# Patient Record
Sex: Male | Born: 1940 | Race: Black or African American | Hispanic: No | Smoking: Never smoker
Health system: Southern US, Community
[De-identification: ages and names within clinical notes are randomized; demographics above are authoritative.]

## PROBLEM LIST (undated history)

## (undated) DIAGNOSIS — C801 Malignant (primary) neoplasm, unspecified: Secondary | ICD-10-CM

## (undated) DIAGNOSIS — I480 Paroxysmal atrial fibrillation: Secondary | ICD-10-CM

## (undated) DIAGNOSIS — I509 Heart failure, unspecified: Secondary | ICD-10-CM

## (undated) DIAGNOSIS — M1990 Unspecified osteoarthritis, unspecified site: Secondary | ICD-10-CM

## (undated) DIAGNOSIS — I10 Essential (primary) hypertension: Secondary | ICD-10-CM

## (undated) DIAGNOSIS — N289 Disorder of kidney and ureter, unspecified: Secondary | ICD-10-CM

## (undated) DIAGNOSIS — E039 Hypothyroidism, unspecified: Secondary | ICD-10-CM

## (undated) DIAGNOSIS — I499 Cardiac arrhythmia, unspecified: Secondary | ICD-10-CM

## (undated) DIAGNOSIS — C61 Malignant neoplasm of prostate: Secondary | ICD-10-CM

## (undated) DIAGNOSIS — N183 Chronic kidney disease, stage 3 unspecified: Secondary | ICD-10-CM

## (undated) DIAGNOSIS — E119 Type 2 diabetes mellitus without complications: Secondary | ICD-10-CM

## (undated) DIAGNOSIS — I890 Lymphedema, not elsewhere classified: Secondary | ICD-10-CM

## (undated) HISTORY — DX: Malignant (primary) neoplasm, unspecified: C80.1

## (undated) HISTORY — DX: Heart failure, unspecified: I50.9

## (undated) HISTORY — DX: Chronic kidney disease, stage 3 unspecified: N18.30

## (undated) HISTORY — DX: Paroxysmal atrial fibrillation: I48.0

## (undated) HISTORY — PX: COLON SURGERY: SHX602

## (undated) HISTORY — DX: Malignant neoplasm of prostate: C61

## (undated) HISTORY — DX: Lymphedema, not elsewhere classified: I89.0

---

## 2006-05-01 ENCOUNTER — Emergency Department (HOSPITAL_COMMUNITY): Admission: EM | Admit: 2006-05-01 | Discharge: 2006-05-01 | Payer: Self-pay | Admitting: Emergency Medicine

## 2007-04-12 ENCOUNTER — Encounter: Admission: RE | Admit: 2007-04-12 | Discharge: 2007-04-12 | Payer: Self-pay | Admitting: Internal Medicine

## 2009-01-29 ENCOUNTER — Encounter: Admission: RE | Admit: 2009-01-29 | Discharge: 2009-01-29 | Payer: Self-pay | Admitting: Internal Medicine

## 2011-09-24 ENCOUNTER — Emergency Department (HOSPITAL_COMMUNITY)
Admission: EM | Admit: 2011-09-24 | Discharge: 2011-09-24 | Disposition: A | Discharge status: Home / Self Care | Payer: No Typology Code available for payment source | Attending: Emergency Medicine | Admitting: Emergency Medicine

## 2011-09-24 ENCOUNTER — Encounter: Payer: Self-pay | Admitting: *Deleted

## 2011-09-24 ENCOUNTER — Emergency Department (HOSPITAL_COMMUNITY): Payer: No Typology Code available for payment source

## 2011-09-24 DIAGNOSIS — T1490XA Injury, unspecified, initial encounter: Secondary | ICD-10-CM | POA: Insufficient documentation

## 2011-09-24 DIAGNOSIS — M545 Low back pain, unspecified: Secondary | ICD-10-CM

## 2011-09-24 DIAGNOSIS — W07XXXA Fall from chair, initial encounter: Secondary | ICD-10-CM | POA: Insufficient documentation

## 2011-09-24 DIAGNOSIS — S40019A Contusion of unspecified shoulder, initial encounter: Secondary | ICD-10-CM | POA: Insufficient documentation

## 2011-09-24 DIAGNOSIS — W19XXXA Unspecified fall, initial encounter: Secondary | ICD-10-CM

## 2011-09-24 DIAGNOSIS — S40011A Contusion of right shoulder, initial encounter: Secondary | ICD-10-CM

## 2011-09-24 HISTORY — DX: Essential (primary) hypertension: I10

## 2011-09-24 MED ORDER — ACETAMINOPHEN 500 MG PO TABS
1000.0000 mg | ORAL_TABLET | Freq: Once | ORAL | Status: AC
  Administered 2011-09-24: 1000 mg via ORAL
  Filled 2011-09-24: qty 2

## 2011-09-24 MED ORDER — TRAMADOL-ACETAMINOPHEN 37.5-325 MG PO TABS: ORAL_TABLET | ORAL | Status: DC

## 2011-09-24 MED ORDER — TRAMADOL HCL 50 MG PO TABS
100.0000 mg | ORAL_TABLET | Freq: Once | ORAL | Status: AC
  Administered 2011-09-24: 100 mg via ORAL
  Filled 2011-09-24: qty 2

## 2011-09-24 NOTE — ED Provider Notes (Cosign Needed)
History     CSN: [id number] Arrival date & time: 09/24/2011  1:50 PM   First MD Initiated Contact with Patient 09/24/11 1500      Chief Complaint  Patient presents with  . Fall    (Consider location/radiation/quality/duration/timing/severity/associated sxs/prior treatment) HPI  Patient relates last night about 8:30 PM he went to a meeting. He states normally there is carpeting on the floor but they were changing carpeting so last night which is the same and 4. He states he sat in a plastic outdoor chair and when he did the legs spread apart and the chair fell and split in 2 and the patient fell to the floor. He denies hitting his head or having loss of consciousness he states he thought he was fine that night however when he woke up this morning he has some discomfort in his right shoulder and has some pain in his lower back. He states it hurts when he moves certain ways. Nothing makes it feel better but he hasn't tried anything. He denies nausea vomiting visual changes.  Primary care physician Dr.Avbuere  Past Medical History  Diagnosis Date  . Hypertension     History reviewed. No pertinent past surgical history.  No family history on file.  History  Substance Use Topics  . Smoking status: Never Smoker   . Smokeless tobacco: Not on file  . Alcohol Use: Yes     rarely   Lives with wife Retired   Review of Systems  All other systems reviewed and are negative.    Allergies  Review of patient's allergies indicates no known allergies.  Home Medications   Current Outpatient Rx  Name Route Sig Dispense Refill  . AMLODIPINE BESYLATE 10 MG PO TABS Oral Take 10 mg by mouth daily.      . ASPIRIN EC 81 MG PO TBEC Oral Take 81 mg by mouth daily.      . OMEGA-3 FATTY ACIDS 1000 MG PO CAPS Oral Take 2 g by mouth daily.      . IBUPROFEN 200 MG PO TABS Oral Take 200 mg by mouth every 6 (six) hours as needed. FOR HEADACHES     . THERA M PLUS PO TABS Oral Take 1 tablet by mouth  daily.      . TELMISARTAN 40 MG PO TABS Oral Take 40 mg by mouth daily.        BP 165/75  Pulse 91  Temp(Src) 98 F (36.7 C) (Oral)  Resp 22  Wt 270 lb (122.471 kg)  SpO2 98%  Vital signs hypertension otherwise normal  Physical Exam  Vitals reviewed. Constitutional: He is oriented to person, place, and time. He appears well-developed and well-nourished.  Eyes: Conjunctivae are normal. Pupils are equal, round, and reactive to light.  Neck: Normal range of motion. Neck supple.  Musculoskeletal:       Patient has nontender right clavicle is nontender in the right subdeltoid bursa region. He has some minor discomfort when he abducts his right arm. He has pain mainly in the posterior shoulder on range of motion but range of motion is intact. Patient is nontender in the cervical or thoracic spine or in his upper lumbar spine. He indicates his pain is in the sacral region and extends out bilaterally over the SI joints. He has no patellar reflexes bilaterally even with distraction. No bruising was seen or abrasions.  Neurological: He is alert and oriented to person, place, and time.  Skin: Skin is warm and dry.  Psychiatric: He has a normal mood and affect. His behavior is normal. Thought content normal.    ED Course  Procedures (including critical care time)  Labs Reviewed - No data to display Dg Lumbar Spine Complete  09/24/2011  *RADIOLOGY REPORT*  Clinical Data: Low back and posterior pain post fall  LUMBAR SPINE - COMPLETE 4+ VIEW  Comparison: None  Findings: Five non-rib bearing lumbar vertebrae. Osseous demineralization. Minimal disc space narrowing L3-L4, L4-L5. Vertebral body heights maintained without fracture or subluxation. Facet degenerative changes lumbar spine. Minimal sclerosis at superior aspect of left SI joint. A number of calcifications are seen in the right mid abdomen of uncertain etiology. No definite spondylolysis. Bulky anterior spurs at L5-S1 as well as at multiple  thoracic vertebral endplates.  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 No acute bony abnormalities.  Original Report Authenticated By: Lollie Marrow, M.D.   Dg Shoulder Right  09/24/2011  *RADIOLOGY REPORT*  Clinical Data: Fall, shoulder pain  RIGHT SHOULDER - 2+ VIEW  Comparison: None.  Findings: Three views of the right shoulder submitted.  No acute fracture or subluxation.  Degenerative changes are noted glenohumeral joint.  Spurring of the right humeral head.  Mild inferior spurring of the acromion.  Mild degenerative changes acromioclavicular joint.  IMPRESSION: No acute fracture or subluxation.  Degenerative changes as described above.  Original Report Authenticated By: Natasha Mead, M.D.   Course patient given ultracet equivalent in the ED.   Diagnoses that have been ruled out:  Diagnoses that are still under consideration:  Final diagnoses:  Fall  Low back pain  Contusion of right shoulder    Medications  traMADol-acetaminophen (ULTRACET) 37.5-325 MG per tablet (not administered)  traMADol (ULTRAM) tablet 100 mg (100 mg Oral Given 09/24/11 1550)  acetaminophen (TYLENOL) tablet 1,000 mg (1000 mg Oral Given 09/24/11 1550)    Devoria Albe, MD, FACEP   MDM          Ward Givens, MD 09/25/11 [phone]

## 2011-09-24 NOTE — ED Notes (Signed)
Pt reports in meeting last night and put outdoor chairs on a cement floor. Chair cracked and patient fell.  Lower back/buttock pain and right shoulder pain.  Pt reports taking ibuprofen yesterday and helped some. Pt has had no medication today. Pain increases with ambulation.  No episodes of incontinence. Pain to lower back with movement.  Shoulder has full ROM in Shoulder. No bruising or deformity  noted to either area

## 2011-09-24 NOTE — ED Notes (Signed)
Pt states "fell last night about 2030, my lower spine and my right shoulder is hurting, the chair collapsed and I just rolled over on the right side"

## 2014-11-01 DIAGNOSIS — M170 Bilateral primary osteoarthritis of knee: Secondary | ICD-10-CM | POA: Insufficient documentation

## 2014-11-01 DIAGNOSIS — I10 Essential (primary) hypertension: Secondary | ICD-10-CM | POA: Insufficient documentation

## 2014-11-01 DIAGNOSIS — M109 Gout, unspecified: Secondary | ICD-10-CM | POA: Insufficient documentation

## 2015-05-26 DIAGNOSIS — N183 Chronic kidney disease, stage 3 unspecified: Secondary | ICD-10-CM | POA: Insufficient documentation

## 2015-06-19 DIAGNOSIS — F418 Other specified anxiety disorders: Secondary | ICD-10-CM | POA: Insufficient documentation

## 2016-06-21 DIAGNOSIS — L603 Nail dystrophy: Secondary | ICD-10-CM | POA: Insufficient documentation

## 2016-06-22 DIAGNOSIS — E538 Deficiency of other specified B group vitamins: Secondary | ICD-10-CM | POA: Insufficient documentation

## 2016-06-22 DIAGNOSIS — D500 Iron deficiency anemia secondary to blood loss (chronic): Secondary | ICD-10-CM | POA: Insufficient documentation

## 2017-10-01 DIAGNOSIS — N201 Calculus of ureter: Secondary | ICD-10-CM | POA: Insufficient documentation

## 2017-10-18 DIAGNOSIS — C184 Malignant neoplasm of transverse colon: Secondary | ICD-10-CM | POA: Insufficient documentation

## 2017-12-27 DIAGNOSIS — Z9181 History of falling: Secondary | ICD-10-CM | POA: Insufficient documentation

## 2019-01-01 DIAGNOSIS — I480 Paroxysmal atrial fibrillation: Secondary | ICD-10-CM | POA: Insufficient documentation

## 2019-01-04 DIAGNOSIS — R7303 Prediabetes: Secondary | ICD-10-CM | POA: Insufficient documentation

## 2020-06-25 ENCOUNTER — Encounter: Payer: Self-pay | Admitting: Nurse Practitioner

## 2020-06-25 ENCOUNTER — Ambulatory Visit (INDEPENDENT_AMBULATORY_CARE_PROVIDER_SITE_OTHER): Payer: Medicare HMO | Admitting: Nurse Practitioner

## 2020-06-25 ENCOUNTER — Other Ambulatory Visit: Payer: Self-pay

## 2020-06-25 VITALS — BP 138/76 | HR 70 | Temp 97.6°F | Resp 18 | Ht 67.0 in | Wt 302.0 lb

## 2020-06-25 DIAGNOSIS — I10 Essential (primary) hypertension: Secondary | ICD-10-CM | POA: Diagnosis not present

## 2020-06-25 DIAGNOSIS — I509 Heart failure, unspecified: Secondary | ICD-10-CM

## 2020-06-25 DIAGNOSIS — Z13220 Encounter for screening for lipoid disorders: Secondary | ICD-10-CM

## 2020-06-25 DIAGNOSIS — Z125 Encounter for screening for malignant neoplasm of prostate: Secondary | ICD-10-CM

## 2020-06-25 DIAGNOSIS — E039 Hypothyroidism, unspecified: Secondary | ICD-10-CM

## 2020-06-25 DIAGNOSIS — N429 Disorder of prostate, unspecified: Secondary | ICD-10-CM

## 2020-06-25 DIAGNOSIS — Z7689 Persons encountering health services in other specified circumstances: Secondary | ICD-10-CM

## 2020-06-25 NOTE — Patient Instructions (Signed)
Weigh at home when you first wake in morning prior to eating, drinking, dressing, report weight gain of 5lbs or more in 3 or less day to provider, seek medical attention. Also seek medical attention and report to provider for 3 days in a row of increased swelling, shortness of breath, cough fatigue.   Referral to cardiology  Follow 2 gram or less sodium per day diet  Take medication as prescribed  Elevated, compress lower extremity to reduce swelling  Follow up Monday  Labs today  Have your records transferred

## 2020-06-25 NOTE — Progress Notes (Signed)
New Patient Office Visit  Subjective:  Patient ID: Terry [name], male    DOB: [birth year]  Age: 79 y.o. MRN: [id number]  CC:  Chief Complaint  Patient presents with  . Establish Care    NP    HPI Terry [name] is a 79 year old male presenting to the clinic to establish care [place] the [place], He has not transferred his records yet but reports having h/o hypothyroid, HTN, CKD, CHF, colon CA with resection no chemo or radiation. He would like a referral to cardiology and nephrology specialist to establish care. He does not require refills at this time. He will have labs today as due. He does admit that he has not been compliant lately with lasix and having edema to ble. He was lacking in literacy of CHF knowledge management therefore time was spent educating the pt on home management, medication regimen therapeutic effects, red flags to report to provider, when to seek medical attention for CHF exacerbation. The pt was able to give verbal understanding feedback. The pt contracted to take his lasix as previous provider has prescribed, follow strict 2 gram or less sodium diet , elevate, compress ble, and if sxs not resolved or worsened will seek medical attention by Monday. No fever, chills, numbness, tingling, gu/gi sxs, pain, sob, cough, palpations, increased fatigue, muscle cramps, drainage, red streaking of ble, recent falls.   Labs to be completed today, pt to have records tx, will follow up with cpe and disease prevention screening when have records to compare and examine.   Past Medical History:  Diagnosis Date  . Hypertension     History reviewed. No pertinent surgical history.  History reviewed. No pertinent family history.  Social History   Socioeconomic History  . Marital status: Widowed    Spouse name: Not on file  . Number of children: Not on file  . Years of education: Not on file  . Highest education level: Not on file  Occupational History  . Not on file    Tobacco Use  . Smoking status: Never Smoker  . Smokeless tobacco: Never Used  Substance and Sexual Activity  . Alcohol use: Yes    Comment: rarely  . Drug use: Yes    Types: Marijuana  . Sexual activity: Yes  Other Topics Concern  . Not on file  Social History Narrative  . Not on file   Social Determinants of Health   Financial Resource Strain:   . Difficulty of Paying Living Expenses:   Food Insecurity:   . Worried About Charity fundraiser in the Last Year:   . Arboriculturist in the Last Year:   Transportation Needs:   . Film/video editor (Medical):   Marland Kitchen Lack of Transportation (Non-Medical):   Physical Activity:   . Days of Exercise per Week:   . Minutes of Exercise per Session:   Stress:   . Feeling of Stress :   Social Connections:   . Frequency of Communication with Friends and Family:   . Frequency of Social Gatherings with Friends and Family:   . Attends Religious Services:   . Active Member of Clubs or Organizations:   . Attends Archivist Meetings:   Marland Kitchen Marital Status:   Intimate Partner Violence:   . Fear of Current or Ex-Partner:   . Emotionally Abused:   Marland Kitchen Physically Abused:   . Sexually Abused:     ROS Review of Systems  All other systems reviewed  and are negative.   Objective:   Today's Vitals: BP 138/76 (BP Location: Left Arm, Patient Position: Sitting, Cuff Size: Large)   Pulse 70   Temp 97.6 F (36.4 C) (Temporal)   Resp 18   Ht 5\' 7"  (1.702 m)   Wt (!) 302 lb (137 kg)   SpO2 96%   BMI 47.30 kg/m   Physical Exam Vitals and nursing note reviewed.  Constitutional:      General: He is not in acute distress.    Appearance: Normal appearance. He is well-developed and well-groomed. He is not ill-appearing or toxic-appearing.  HENT:     Head: Normocephalic and atraumatic.     Right Ear: Hearing and external ear normal.     Left Ear: Hearing and external ear normal.     Nose: Nose normal.  Eyes:     General: Lids are  normal. Lids are everted, no foreign bodies appreciated.     Extraocular Movements: Extraocular movements intact.     Conjunctiva/sclera: Conjunctivae normal.     Pupils: Pupils are equal, round, and reactive to light.  Neck:     Thyroid: No thyromegaly or thyroid tenderness.     Vascular: No carotid bruit or JVD.  Cardiovascular:     Rate and Rhythm: Normal rate and regular rhythm.     Pulses: Normal pulses.     Heart sounds: Normal heart sounds, S1 normal and S2 normal.  Pulmonary:     Effort: Pulmonary effort is normal.     Breath sounds: Normal breath sounds.  Abdominal:     General: There is no distension or abdominal bruit.     Palpations: Abdomen is soft. There is no hepatomegaly or splenomegaly.     Tenderness: There is no guarding.  Musculoskeletal:        General: Normal range of motion.     Cervical back: Normal range of motion and neck supple.     Right lower leg: Edema present.     Left lower leg: Edema present.  Lymphadenopathy:     Cervical: No cervical adenopathy.  Skin:    General: Skin is warm and dry.     Capillary Refill: Capillary refill takes less than 2 seconds.     Coloration: Skin is not jaundiced or pale.  Neurological:     General: No focal deficit present.     Mental Status: He is alert and oriented to person, place, and time.  Psychiatric:        Attention and Perception: Attention normal.        Mood and Affect: Mood and affect normal.        Speech: Speech normal.        Behavior: Behavior normal. Behavior is cooperative.        Thought Content: Thought content normal.        Judgment: Judgment normal.     Assessment & Plan:   Problem List Items Addressed This Visit    None    Visit Diagnoses    Hypertension, unspecified type    -  Primary   Relevant Medications   carvedilol (COREG) 25 MG tablet   NIFEdipine (PROCARDIA XL/NIFEDICAL XL) 60 MG 24 hr tablet   apixaban (ELIQUIS) 5 MG TABS tablet   furosemide (LASIX) 40 MG tablet   Other  Relevant Orders   T4, free   Lipid panel   CBC with Differential/Platelet   COMPLETE METABOLIC PANEL WITH GFR   TSH   PSA   Congestive  heart failure, unspecified HF chronicity, unspecified heart failure type (HCC)       Relevant Medications   carvedilol (COREG) 25 MG tablet   NIFEdipine (PROCARDIA XL/NIFEDICAL XL) 60 MG 24 hr tablet   apixaban (ELIQUIS) 5 MG TABS tablet   furosemide (LASIX) 40 MG tablet   Other Relevant Orders   T4, free   Lipid panel   CBC with Differential/Platelet   COMPLETE METABOLIC PANEL WITH GFR   TSH   PSA   Hypothyroidism, unspecified type       Relevant Medications   carvedilol (COREG) 25 MG tablet   levothyroxine (SYNTHROID) 25 MCG tablet   Other Relevant Orders   T4, free   Lipid panel   CBC with Differential/Platelet   COMPLETE METABOLIC PANEL WITH GFR   TSH   PSA   Lipid screening       Relevant Orders   T4, free   Lipid panel   CBC with Differential/Platelet   COMPLETE METABOLIC PANEL WITH GFR   TSH   PSA   Encounter to establish care       Relevant Orders   T4, free   Lipid panel   CBC with Differential/Platelet   COMPLETE METABOLIC PANEL WITH GFR   TSH   PSA   Prostate cancer screening       Relevant Orders   T4, free   Lipid panel   CBC with Differential/Platelet   COMPLETE METABOLIC PANEL WITH GFR   TSH   PSA   Disorder of prostate, unspecified        Relevant Orders   PSA    Weigh at home when you first wake in morning prior to eating, drinking, dressing, report weight gain of 5lbs or more in 3 or less day to provider, seek medical attention. Also seek medical attention and report to provider for 3 days in a row of increased swelling, shortness of breath, cough fatigue.   Referral to cardiology  Follow 2 gram or less sodium per day diet  Take medication as prescribed  Elevated, compress lower extremity to reduce swelling  Follow up Monday  Labs today  Have your records transferred  Outpatient Encounter  Medications as of 06/25/2020  Medication Sig  . amLODipine (NORVASC) 10 MG tablet Take 10 mg by mouth daily.    Marland Kitchen apixaban (ELIQUIS) 5 MG TABS tablet Take 5 mg by mouth 2 (two) times daily.  Marland Kitchen aspirin EC 81 MG tablet Take 81 mg by mouth daily.    . carvedilol (COREG) 25 MG tablet Take 25 mg by mouth 2 (two) times daily with a meal.  . fish oil-omega-3 fatty acids 1000 MG capsule Take 2 g by mouth daily.    Marland Kitchen ibuprofen (ADVIL,MOTRIN) 200 MG tablet Take 200 mg by mouth every 6 (six) hours as needed. FOR HEADACHES   . levothyroxine (SYNTHROID) 25 MCG tablet Take 25 mcg by mouth daily before breakfast.  . Multiple Vitamins-Minerals (MULTIVITAMINS THER. W/MINERALS) TABS Take 1 tablet by mouth daily.    Marland Kitchen NIFEdipine (PROCARDIA XL/NIFEDICAL XL) 60 MG 24 hr tablet Take 60 mg by mouth daily.  Marland Kitchen telmisartan (MICARDIS) 40 MG tablet Take 40 mg by mouth daily.    . traMADol-acetaminophen (ULTRACET) 37.5-325 MG per tablet Use 1 or 2 po QID prn pain  . allopurinol (ZYLOPRIM) 300 MG tablet Take 300 mg by mouth daily.  . furosemide (LASIX) 40 MG tablet Take 20 mg by mouth daily.   No facility-administered encounter medications on file as  of 06/25/2020.    Follow-up: Return in about 1 week (around 07/02/2020).   Annie Main, FNP

## 2020-06-26 LAB — COMPLETE METABOLIC PANEL WITH GFR
AG Ratio: 1.1 (calc) (ref 1.0–2.5)
ALT: 9 U/L (ref 9–46)
AST: 16 U/L (ref 10–35)
Albumin: 4.2 g/dL (ref 3.6–5.1)
Alkaline phosphatase (APISO): 63 U/L (ref 35–144)
BUN/Creatinine Ratio: 12 (calc) (ref 6–22)
BUN: 15 mg/dL (ref 7–25)
CO2: 22 mmol/L (ref 20–32)
Calcium: 9.6 mg/dL (ref 8.6–10.3)
Chloride: 106 mmol/L (ref 98–110)
Creat: 1.22 mg/dL — ABNORMAL HIGH (ref 0.70–1.18)
GFR, Est African American: 65 mL/min/{1.73_m2} (ref >=60)
GFR, Est Non African American: 56 mL/min/{1.73_m2} — ABNORMAL LOW (ref >=60)
Globulin: 3.8 g/dL (calc) — ABNORMAL HIGH (ref 1.9–3.7)
Glucose, Bld: 88 mg/dL (ref 65–99)
Potassium: 3.8 mmol/L (ref 3.5–5.3)
Sodium: 138 mmol/L (ref 135–146)
Total Bilirubin: 0.4 mg/dL (ref 0.2–1.2)
Total Protein: 8 g/dL (ref 6.1–8.1)

## 2020-06-26 LAB — CBC WITH DIFFERENTIAL/PLATELET
Absolute Monocytes: 646 cells/uL (ref 200–950)
Basophils Absolute: 32 cells/uL (ref 0–200)
Basophils Relative: 0.5 %
Eosinophils Absolute: 122 cells/uL (ref 15–500)
Eosinophils Relative: 1.9 %
HCT: 42.4 % (ref 38.5–50.0)
Hemoglobin: 13 g/dL — ABNORMAL LOW (ref 13.2–17.1)
Lymphs Abs: 915 cells/uL (ref 850–3900)
MCH: 24.7 pg — ABNORMAL LOW (ref 27.0–33.0)
MCHC: 30.7 g/dL — ABNORMAL LOW (ref 32.0–36.0)
MCV: 80.5 fL (ref 80.0–100.0)
MPV: 10.6 fL (ref 7.5–12.5)
Monocytes Relative: 10.1 %
Neutro Abs: 4685 cells/uL (ref 1500–7800)
Neutrophils Relative %: 73.2 %
Platelets: 224 10*3/uL (ref 140–400)
RBC: 5.27 10*6/uL (ref 4.20–5.80)
RDW: 16.3 % — ABNORMAL HIGH (ref 11.0–15.0)
Total Lymphocyte: 14.3 %
WBC: 6.4 10*3/uL (ref 3.8–10.8)

## 2020-06-26 LAB — LIPID PANEL
Cholesterol: 112 mg/dL (ref <200)
HDL: 35 mg/dL — ABNORMAL LOW (ref >=40)
LDL Cholesterol (Calc): 63 mg/dL (calc)
Non-HDL Cholesterol (Calc): 77 mg/dL (calc) (ref <130)
Total CHOL/HDL Ratio: 3.2 (calc) (ref <5.0)
Triglycerides: 65 mg/dL (ref <150)

## 2020-06-26 LAB — PSA: PSA: 32.9 ng/mL — ABNORMAL HIGH (ref <=4.0)

## 2020-06-26 LAB — TSH: TSH: 3.45 mIU/L (ref 0.40–4.50)

## 2020-06-26 LAB — T4, FREE: Free T4: 1.2 ng/dL (ref 0.8–1.8)

## 2020-06-30 ENCOUNTER — Ambulatory Visit (INDEPENDENT_AMBULATORY_CARE_PROVIDER_SITE_OTHER): Payer: Medicare HMO | Admitting: Nurse Practitioner

## 2020-06-30 ENCOUNTER — Other Ambulatory Visit: Payer: Self-pay

## 2020-06-30 VITALS — BP 150/82 | HR 77 | Temp 97.8°F | Resp 18 | Wt 299.6 lb

## 2020-06-30 DIAGNOSIS — I509 Heart failure, unspecified: Secondary | ICD-10-CM

## 2020-06-30 DIAGNOSIS — L03119 Cellulitis of unspecified part of limb: Secondary | ICD-10-CM | POA: Diagnosis not present

## 2020-06-30 DIAGNOSIS — R972 Elevated prostate specific antigen [PSA]: Secondary | ICD-10-CM

## 2020-06-30 DIAGNOSIS — D539 Nutritional anemia, unspecified: Secondary | ICD-10-CM

## 2020-06-30 MED ORDER — CEFTRIAXONE SODIUM 1 G IJ SOLR
1.0000 g | Freq: Once | INTRAMUSCULAR | Status: AC
  Administered 2020-06-30: 1 g via INTRAMUSCULAR

## 2020-06-30 MED ORDER — FUROSEMIDE 40 MG PO TABS
ORAL_TABLET | ORAL | 0 refills | Status: DC
Start: 2020-06-30 — End: 2020-10-20

## 2020-06-30 MED ORDER — CEPHALEXIN 500 MG PO CAPS: 500.0000 mg | ORAL_CAPSULE | Freq: Four times a day (QID) | ORAL | 0 refills | Status: AC

## 2020-06-30 NOTE — Addendum Note (Signed)
Addended by: Ishmael Holter on: 06/30/2020 01:12 PM   Modules accepted: Level of Service

## 2020-06-30 NOTE — Progress Notes (Signed)
Established Patient Office Visit  Subjective:  Patient ID: Terry [name], male    DOB: Apr 07, 1941  Age: 79 y.o. MRN: [id number]  CC:  Chief Complaint  Patient presents with  . Follow-up    edema    HPI Terry [name] is a 79 year old male that established care last week following up today r/t he was not compliant with medications for CHF and having edema to BLE and directed as below.   " Congestive heart failure, unspecified HF chronicity, unspecified heart failure type (HCC)        Relevant Medications   carvedilol (COREG) 25 MG tablet   NIFEdipine (PROCARDIA XL/NIFEDICAL XL) 60 MG 24 hr tablet   apixaban (ELIQUIS) 5 MG TABS tablet   furosemide (LASIX) 40 MG tablet    Weigh at home when you first wake in morning prior to eating, drinking, dressing, report weight gain of 5lbs or more in 3 or less day to provider, seek medical attention. Also seek medical attention and report to provider for 3 days in a row of increased swelling, shortness of breath, cough fatigue.   Referral to cardiology  Follow 2 gram or less sodium per day diet  Take medication as prescribed  Elevated, compress lower extremity to reduce swelling  Follow up Monday. "  Past Medical History:  Diagnosis Date  . Hypertension     No past surgical history on file.  No family history on file.  Social History   Socioeconomic History  . Marital status: Widowed    Spouse name: Not on file  . Number of children: Not on file  . Years of education: Not on file  . Highest education level: Not on file  Occupational History  . Not on file  Tobacco Use  . Smoking status: Never Smoker  . Smokeless tobacco: Never Used  Substance and Sexual Activity  . Alcohol use: Yes    Comment: rarely  . Drug use: Yes    Types: Marijuana  . Sexual activity: Yes  Other Topics Concern  . Not on file  Social History Narrative  . Not on file   Social Determinants of Health   Financial Resource Strain:     . Difficulty of Paying Living Expenses:   Food Insecurity:   . Worried About Charity fundraiser in the Last Year:   . Arboriculturist in the Last Year:   Transportation Needs:   . Film/video editor (Medical):   Marland Kitchen Lack of Transportation (Non-Medical):   Physical Activity:   . Days of Exercise per Week:   . Minutes of Exercise per Session:   Stress:   . Feeling of Stress :   Social Connections:   . Frequency of Communication with Friends and Family:   . Frequency of Social Gatherings with Friends and Family:   . Attends Religious Services:   . Active Member of Clubs or Organizations:   . Attends Archivist Meetings:   Marland Kitchen Marital Status:   Intimate Partner Violence:   . Fear of Current or Ex-Partner:   . Emotionally Abused:   Marland Kitchen Physically Abused:   . Sexually Abused:     Outpatient Medications Prior to Visit  Medication Sig Dispense Refill  . allopurinol (ZYLOPRIM) 300 MG tablet Take 300 mg by mouth daily.    Marland Kitchen amLODipine (NORVASC) 10 MG tablet Take 10 mg by mouth daily.      Marland Kitchen apixaban (ELIQUIS) 5 MG TABS tablet Take 5 mg by  mouth 2 (two) times daily.    Marland Kitchen aspirin EC 81 MG tablet Take 81 mg by mouth daily.      . carvedilol (COREG) 25 MG tablet Take 25 mg by mouth 2 (two) times daily with a meal.    . fish oil-omega-3 fatty acids 1000 MG capsule Take 2 g by mouth daily.      . furosemide (LASIX) 40 MG tablet Take 20 mg by mouth daily.    Marland Kitchen ibuprofen (ADVIL,MOTRIN) 200 MG tablet Take 200 mg by mouth every 6 (six) hours as needed. FOR HEADACHES     . levothyroxine (SYNTHROID) 25 MCG tablet Take 25 mcg by mouth daily before breakfast.    . Multiple Vitamins-Minerals (MULTIVITAMINS THER. W/MINERALS) TABS Take 1 tablet by mouth daily.      Marland Kitchen NIFEdipine (PROCARDIA XL/NIFEDICAL XL) 60 MG 24 hr tablet Take 60 mg by mouth daily.    Marland Kitchen telmisartan (MICARDIS) 40 MG tablet Take 40 mg by mouth daily.      . traMADol-acetaminophen (ULTRACET) 37.5-325 MG per tablet Use 1 or 2  po QID prn pain 30 tablet 0   No facility-administered medications prior to visit.    No Known Allergies  ROS Review of Systems  All other systems reviewed and are negative.     Objective:    Physical Exam Vitals and nursing note reviewed.  Constitutional:      Appearance: Normal appearance.  HENT:     Head: Normocephalic.  Eyes:     Extraocular Movements: Extraocular movements intact.     Conjunctiva/sclera: Conjunctivae normal.     Pupils: Pupils are equal, round, and reactive to light.  Cardiovascular:     Rate and Rhythm: Normal rate and regular rhythm.  Pulmonary:     Effort: Pulmonary effort is normal.     Breath sounds: Normal breath sounds.  Musculoskeletal:     Right lower leg: Edema present.     Left lower leg: Edema present.  Skin:    Capillary Refill: Capillary refill takes less than 2 seconds.     Coloration: Skin is not jaundiced or pale.  Neurological:     General: No focal deficit present.     Mental Status: He is alert and oriented to person, place, and time.  Psychiatric:        Mood and Affect: Mood normal.        Thought Content: Thought content normal.        Judgment: Judgment normal.     BP (!) 150/82 (BP Location: Right Arm, Patient Position: Sitting, Cuff Size: Large)   Pulse 77   Temp 97.8 F (36.6 C) (Temporal)   Resp 18   Wt 299 lb 9.6 oz (135.9 kg)   SpO2 97%   BMI 46.92 kg/m  Wt Readings from Last 3 Encounters:  06/30/20 299 lb 9.6 oz (135.9 kg)  06/25/20 (!) 302 lb (137 kg)  09/24/11 270 lb (122.5 kg)     Health Maintenance Due  Topic Date Due  . Hepatitis C Screening  Never done  . INFLUENZA VACCINE  06/15/2020    There are no preventive care reminders to display for this patient.  Lab Results  Component Value Date   TSH 3.45 06/25/2020   Lab Results  Component Value Date   WBC 6.4 06/25/2020   HGB 13.0 (L) 06/25/2020   HCT 42.4 06/25/2020   MCV 80.5 06/25/2020   PLT 224 06/25/2020   Lab Results   Component Value Date  NA 138 06/25/2020   K 3.8 06/25/2020   CO2 22 06/25/2020   GLUCOSE 88 06/25/2020   BUN 15 06/25/2020   CREATININE 1.22 (H) 06/25/2020   BILITOT 0.4 06/25/2020   AST 16 06/25/2020   ALT 9 06/25/2020   PROT 8.0 06/25/2020   CALCIUM 9.6 06/25/2020   Lab Results  Component Value Date   CHOL 112 06/25/2020   Lab Results  Component Value Date   HDL 35 (L) 06/25/2020   Lab Results  Component Value Date   LDLCALC 63 06/25/2020   Lab Results  Component Value Date   TRIG 65 06/25/2020   Lab Results  Component Value Date   CHOLHDL 3.2 06/25/2020   No results found for: HGBA1C    Assessment & Plan:   Problem List Items Addressed This Visit      Other   Macrocytic anemia   Relevant Orders   B12 and Folate Panel   Elevated PSA   Relevant Orders   Ambulatory referral to Urology   Cellulitis of lower extremity - Primary   Relevant Medications   cephALEXin (KEFLEX) 500 MG capsule   furosemide (LASIX) 40 MG tablet    Other Visit Diagnoses    Congestive heart failure, unspecified HF chronicity, unspecified heart failure type (HCC)       Relevant Medications   furosemide (LASIX) 40 MG tablet    Time spent reviewing labs, BLE cellulitis/edema/CHF treatment plan, home management, diet with pt. Compression wrap ble applied with CMA assistance r/t weight of BLE and pt inability to assist with raising legs while rapping. IM 1 gram Rocephin administered pt waited 15 minutes no adverse reaction pt tolerated well, po RX antibiotic Keflex 500 mg take as prescribed, and Lasix 3 day extra 40 mg ose RX pt will take total 80 mg daily for 3 day, follow 1500 ml fluid restriction, 2 G sodium restriction returning in one week. Seeking medical attention for new, worsening, non resolving sxs.  Records needed! Please have transferred asap.   b12 and folate labs r/t macrocytic anemia  Referral to urology for elevated PSA  Referral again to Cardiology  Both stated  discussed in length with pt.   Time spent with pt 45 minutes.   Meds ordered this encounter  Medications  . cefTRIAXone (ROCEPHIN) injection 1 g  . cephALEXin (KEFLEX) 500 MG capsule    Sig: Take 1 capsule (500 mg total) by mouth 4 (four) times daily for 5 days.    Dispense:  20 capsule    Refill:  0  . furosemide (LASIX) 40 MG tablet    Sig: Take one tablet orally on Monday, Tuesday, and Wednesday with your regular daily dose to equal 80 mg daily for three day (06/30/2020-07/02/2020)    Dispense:  3 tablet    Refill:  0    Follow-up: Return in about 3 days (around 07/03/2020).    Annie Main, FNP

## 2020-07-02 DIAGNOSIS — I509 Heart failure, unspecified: Secondary | ICD-10-CM | POA: Insufficient documentation

## 2020-07-02 NOTE — Progress Notes (Signed)
Macrocytic anemia: B12 and Folate Panel (Order #[id number]) on 06/30/20 ORDERED Referred to nephrology  Referred to cardiology both on est care apt already with pts history  PSA is ELEVATED:  have referred to Urology let pt know.

## 2020-07-03 ENCOUNTER — Ambulatory Visit: Payer: Medicare Other | Admitting: Nurse Practitioner

## 2020-07-07 ENCOUNTER — Other Ambulatory Visit: Payer: Self-pay

## 2020-07-07 ENCOUNTER — Ambulatory Visit (INDEPENDENT_AMBULATORY_CARE_PROVIDER_SITE_OTHER): Payer: Medicare HMO | Admitting: Nurse Practitioner

## 2020-07-07 VITALS — BP 126/80 | HR 65 | Temp 97.6°F | Resp 18 | Wt 300.8 lb

## 2020-07-07 DIAGNOSIS — I10 Essential (primary) hypertension: Secondary | ICD-10-CM

## 2020-07-07 DIAGNOSIS — L03119 Cellulitis of unspecified part of limb: Secondary | ICD-10-CM

## 2020-07-07 DIAGNOSIS — I509 Heart failure, unspecified: Secondary | ICD-10-CM | POA: Diagnosis not present

## 2020-07-07 NOTE — Progress Notes (Signed)
Established Patient Office Visit  Subjective:  Patient ID: Terry [name], male    DOB: 26-May-1941  Age: 79 y.o. MRN: [id number]  CC:  Chief Complaint  Patient presents with  . Edema    follow up    HPI Terry [name] is a 79 year ld male with h/o CHF moving to the area recently presenting for a f/u after tx for ble cellulitis secondary CHF exacerbation.  Much improvement of ble cellulitis. Spoke with out of state prior cards direction to take qod lasix he has not been compliant r/t a family/friend embarrassed him mentioning incontinence which happens to him when he takes the lasix. He is compelled to be compliant with his CHF medication  Time spent educating, compelling, planning treatment plan discussing CHF. Referrals in place for Cards pt reports not receiving call with appt at this time he will check with office manager for contact information. Pt compelled to continue 2 G sodium restricted diet per day, 1510ml fluid restrictions per day, elevate ble often daily, compression hose often, compliance with medication, dry daily am weights upon waking prior to dressing eating or drinking but after voiding reporting 5lbs or more weight gain in 3 or less days and/or 3 days of increasing sxs of cough, sob, swelling in abdomen or feet, fatigue.  No cp, ct, gu/gi sxs, pain, sob, palpitations, decreased edema, no falls/injury since last visit.   Past Medical History:  Diagnosis Date  . Hypertension     No past surgical history on file.  No family history on file.  Social History   Socioeconomic History  . Marital status: Widowed    Spouse name: Not on file  . Number of children: Not on file  . Years of education: Not on file  . Highest education level: Not on file  Occupational History  . Not on file  Tobacco Use  . Smoking status: Never Smoker  . Smokeless tobacco: Never Used  Substance and Sexual Activity  . Alcohol use: Yes    Comment: rarely  . Drug use: Yes    Types:  Marijuana  . Sexual activity: Yes  Other Topics Concern  . Not on file  Social History Narrative  . Not on file   Social Determinants of Health   Financial Resource Strain:   . Difficulty of Paying Living Expenses: Not on file  Food Insecurity:   . Worried About Charity fundraiser in the Last Year: Not on file  . Ran Out of Food in the Last Year: Not on file  Transportation Needs:   . Lack of Transportation (Medical): Not on file  . Lack of Transportation (Non-Medical): Not on file  Physical Activity:   . Days of Exercise per Week: Not on file  . Minutes of Exercise per Session: Not on file  Stress:   . Feeling of Stress : Not on file  Social Connections:   . Frequency of Communication with Friends and Family: Not on file  . Frequency of Social Gatherings with Friends and Family: Not on file  . Attends Religious Services: Not on file  . Active Member of Clubs or Organizations: Not on file  . Attends Archivist Meetings: Not on file  . Marital Status: Not on file  Intimate Partner Violence:   . Fear of Current or Ex-Partner: Not on file  . Emotionally Abused: Not on file  . Physically Abused: Not on file  . Sexually Abused: Not on file    Outpatient Medications  Prior to Visit  Medication Sig Dispense Refill  . allopurinol (ZYLOPRIM) 300 MG tablet Take 300 mg by mouth daily.    Marland Kitchen amLODipine (NORVASC) 10 MG tablet Take 10 mg by mouth daily.      Marland Kitchen apixaban (ELIQUIS) 5 MG TABS tablet Take 5 mg by mouth 2 (two) times daily.    Marland Kitchen aspirin EC 81 MG tablet Take 81 mg by mouth daily.      . carvedilol (COREG) 25 MG tablet Take 25 mg by mouth 2 (two) times daily with a meal.    . fish oil-omega-3 fatty acids 1000 MG capsule Take 2 g by mouth daily.      . furosemide (LASIX) 40 MG tablet Take 20 mg by mouth daily.    . furosemide (LASIX) 40 MG tablet Take one tablet orally on Monday, Tuesday, and Wednesday with your regular daily dose to equal 80 mg daily for three day  (06/30/2020-07/02/2020) 3 tablet 0  . ibuprofen (ADVIL,MOTRIN) 200 MG tablet Take 200 mg by mouth every 6 (six) hours as needed. FOR HEADACHES     . levothyroxine (SYNTHROID) 25 MCG tablet Take 25 mcg by mouth daily before breakfast.    . Multiple Vitamins-Minerals (MULTIVITAMINS THER. W/MINERALS) TABS Take 1 tablet by mouth daily.      Marland Kitchen NIFEdipine (PROCARDIA XL/NIFEDICAL XL) 60 MG 24 hr tablet Take 60 mg by mouth daily.    Marland Kitchen telmisartan (MICARDIS) 40 MG tablet Take 40 mg by mouth daily.      . traMADol-acetaminophen (ULTRACET) 37.5-325 MG per tablet Use 1 or 2 po QID prn pain 30 tablet 0   No facility-administered medications prior to visit.    No Known Allergies  ROS Review of Systems  All other systems reviewed and are negative.     Objective:    Physical Exam Vitals and nursing note reviewed.  Constitutional:      General: He is not in acute distress.    Appearance: Normal appearance. He is well-developed and well-groomed. He is obese. He is not ill-appearing.  HENT:     Head: Normocephalic.     Right Ear: Hearing normal.     Left Ear: Hearing normal.     Mouth/Throat:     Lips: Pink.  Eyes:     Extraocular Movements: Extraocular movements intact.     Conjunctiva/sclera: Conjunctivae normal.     Pupils: Pupils are equal, round, and reactive to light.  Neck:     Vascular: No JVD.  Musculoskeletal:        General: Normal range of motion.     Right lower leg: Edema present.     Left lower leg: Edema present.  Skin:    Coloration: Skin is not jaundiced or pale.  Neurological:     General: No focal deficit present.     Mental Status: He is alert and oriented to person, place, and time.  Psychiatric:        Attention and Perception: Attention and perception normal.        Mood and Affect: Mood and affect normal.        Speech: Speech normal.        Behavior: Behavior normal. Behavior is cooperative.        Thought Content: Thought content normal.        Cognition  and Memory: Cognition normal.        Judgment: Judgment normal.     BP 126/80 (BP Location: Right Arm, Patient Position: Sitting, Cuff  Size: Large)   Pulse 65   Temp 97.6 F (36.4 C) (Temporal)   Resp 18   Wt (!) 300 lb 12.8 oz (136.4 kg)   SpO2 97%   BMI 47.11 kg/m  Wt Readings from Last 3 Encounters:  07/07/20 (!) 300 lb 12.8 oz (136.4 kg)  06/30/20 299 lb 9.6 oz (135.9 kg)  06/25/20 (!) 302 lb (137 kg)     Health Maintenance Due  Topic Date Due  . Hepatitis C Screening  Never done  . INFLUENZA VACCINE  06/15/2020    There are no preventive care reminders to display for this patient.  Lab Results  Component Value Date   TSH 3.45 06/25/2020   Lab Results  Component Value Date   WBC 6.4 06/25/2020   HGB 13.0 (L) 06/25/2020   HCT 42.4 06/25/2020   MCV 80.5 06/25/2020   PLT 224 06/25/2020   Lab Results  Component Value Date   NA 138 06/25/2020   K 3.8 06/25/2020   CO2 22 06/25/2020   GLUCOSE 88 06/25/2020   BUN 15 06/25/2020   CREATININE 1.22 (H) 06/25/2020   BILITOT 0.4 06/25/2020   AST 16 06/25/2020   ALT 9 06/25/2020   PROT 8.0 06/25/2020   CALCIUM 9.6 06/25/2020   Lab Results  Component Value Date   CHOL 112 06/25/2020   Lab Results  Component Value Date   HDL 35 (L) 06/25/2020   Lab Results  Component Value Date   LDLCALC 63 06/25/2020   Lab Results  Component Value Date   TRIG 65 06/25/2020   Lab Results  Component Value Date   CHOLHDL 3.2 06/25/2020   No results found for: HGBA1C    Assessment & Plan:   Problem List Items Addressed This Visit      Cardiovascular and Mediastinum   Congestive heart failure (HCC)     Other   Cellulitis of lower extremity - Primary    Other Visit Diagnoses    Hypertension, unspecified type        CHF, BLE cellulitis: Referrals in place for Cards pt reports not receiving call with appt at this time he will check with office manager for contact information. Pt compelled to continue 2 G sodium  restricted diet per day, 1553ml fluid restrictions per day, elevate ble often daily, compression hose often, compliance with medication, dry daily am weights upon waking prior to dressing eating or drinking but after voiding reporting 5lbs or more weight gain in 3 or less days and/or 3 days of increasing sxs of cough, sob, swelling in abdomen or feet, fatigue.  HTN: stable improved reading in clinic today, continue home bp monitoring 3 hours after taking blood pressure medications keeping log to bring to appt and seeking medical attn out of range >140/90  No orders of the defined types were placed in this encounter.   Follow-up: Return if symptoms worsen or fail to improve, for and chronic conditions every 6 months.    Annie Main, FNP

## 2020-07-07 NOTE — Patient Instructions (Signed)
CHF, BLE cellulitis: Referrals in place for Cards pt reports not receiving call with appt at this time he will check with office manager for contact information. Pt compelled to continue 2 G sodium restricted diet per day, 1568ml fluid restrictions per day, elevate ble often daily, compression hose often, compliance with medication, dry daily am weights upon waking prior to dressing eating or drinking but after voiding reporting 5lbs or more weight gain in 3 or less days and/or 3 days of increasing sxs of cough, sob, swelling in abdomen or feet, fatigue.  HTN: stable improved reading in clinic today, continue home bp monitoring 3 hours after taking blood pressure medications keeping log to bring to appt and seeking medical attn out of range >140/90

## 2020-07-08 ENCOUNTER — Encounter: Payer: Self-pay | Admitting: Urology

## 2020-07-09 ENCOUNTER — Ambulatory Visit: Payer: Self-pay | Admitting: Adult Health

## 2020-07-09 DIAGNOSIS — Z0289 Encounter for other administrative examinations: Secondary | ICD-10-CM

## 2020-07-15 ENCOUNTER — Telehealth: Payer: Medicare HMO | Admitting: Nurse Practitioner

## 2020-07-15 ENCOUNTER — Other Ambulatory Visit: Payer: Self-pay

## 2020-07-15 ENCOUNTER — Other Ambulatory Visit: Payer: Self-pay | Admitting: Nurse Practitioner

## 2020-07-15 DIAGNOSIS — I509 Heart failure, unspecified: Secondary | ICD-10-CM

## 2020-07-17 ENCOUNTER — Telehealth: Payer: Self-pay | Admitting: Nurse Practitioner

## 2020-07-17 ENCOUNTER — Other Ambulatory Visit: Payer: Self-pay | Admitting: Nurse Practitioner

## 2020-07-17 ENCOUNTER — Ambulatory Visit: Payer: Medicare HMO | Admitting: Nurse Practitioner

## 2020-07-17 DIAGNOSIS — N189 Chronic kidney disease, unspecified: Secondary | ICD-10-CM

## 2020-07-17 DIAGNOSIS — R600 Localized edema: Secondary | ICD-10-CM

## 2020-07-17 NOTE — Telephone Encounter (Signed)
Spoke to pt verified hippa, he asked for me to speak to his dgtr [name]. She expressed desire for palliative care at home for pt. Does not desire A.L

## 2020-07-17 NOTE — Telephone Encounter (Signed)
Spoke to [place] to notify her of referral given to [name] at [facility] for palliative CHF care today as she and pt requested. H/H for ble wound care compression wrap and medication management placed referral today as well. [facility] office manager aware to handle referral asap/stat.

## 2020-07-17 NOTE — Telephone Encounter (Signed)
spoke to [place] gave verbal refaeral for palliative CHF referral she was able to pull records from [facility] and has what she needs. REFerral acceptted    By Annie Main, FNP

## 2020-07-23 ENCOUNTER — Telehealth: Payer: Self-pay | Admitting: Nurse Practitioner

## 2020-07-23 NOTE — Telephone Encounter (Signed)
Telephone encounter [facility]

## 2020-07-25 ENCOUNTER — Telehealth: Payer: Self-pay | Admitting: *Deleted

## 2020-07-25 NOTE — Telephone Encounter (Signed)
Received call from [place], [facility] PT with [facility].   Requested orders ofr HH PT 1x weekly x1 week, 2x weekly x5 weeks, then 1x weekly x1 week for strengthening, gait, balance, home exercise program and home safety.   VO given.

## 2020-07-28 ENCOUNTER — Encounter: Payer: Self-pay | Admitting: Cardiovascular Disease

## 2020-07-28 ENCOUNTER — Ambulatory Visit (INDEPENDENT_AMBULATORY_CARE_PROVIDER_SITE_OTHER): Payer: Medicare HMO | Admitting: Cardiovascular Disease

## 2020-07-28 ENCOUNTER — Other Ambulatory Visit: Payer: Self-pay

## 2020-07-28 DIAGNOSIS — R600 Localized edema: Secondary | ICD-10-CM | POA: Diagnosis not present

## 2020-07-28 DIAGNOSIS — I10 Essential (primary) hypertension: Secondary | ICD-10-CM

## 2020-07-28 DIAGNOSIS — I5032 Chronic diastolic (congestive) heart failure: Secondary | ICD-10-CM

## 2020-07-28 NOTE — Progress Notes (Signed)
Cardiology Office Note  Date:  07/28/2020   ID:  Terry [name], DOB 1941-01-09, MRN [id number]  PCP:  Annie Main, FNP   Chief Complaint  Patient presents with  . New Patient (Initial Visit)    Referred by PCP for CHF. Patient c.o chest pain and swelling in ankles. Meds reviewed verbally with patient.     HPI:  [name] is a 79 year old gentleman with past medical history of Lower extremity edema Chronic kidney disease Morbid obesity Cellulitis Essential hypertension Incontinence on Lasix Referred by Terry [name] for consultation of his congestive heart failure  Long discussion with him concerning prior history Reports he has been seen by cardiologist in the past, lymphedema compression pumps has been prescribed Has been on Lasix daily but difficulty tolerating this, typically takes it every other day Has not been wearing his lymphedema compression pumps on a regular basis  He does not have accurate medication list with him Current medication list includes amlodipine, nifedipine Different doses of Lasix listed on his medication list  Lab work reviewed Creatinine 1.22 BUN 15 Hemoglobin 13 Total cholesterol 112 LDL 63  As to the timing of his leg swelling, onset unclear Unclear what his goal weight is, he does not know Relatively sedentary, presenting in a wheelchair  EKG personally reviewed by myself on todays visit Shows normal sinus rhythm rate 71 bpm no significant ST-T wave changes   PMH:   has a past medical history of Hypertension.  PSH:   History reviewed. No pertinent surgical history.  Current Outpatient Medications  Medication Sig Dispense Refill  . allopurinol (ZYLOPRIM) 300 MG tablet Take 300 mg by mouth daily.    Marland Kitchen amLODipine (NORVASC) 10 MG tablet Take 10 mg by mouth daily.      Marland Kitchen apixaban (ELIQUIS) 5 MG TABS tablet Take 5 mg by mouth 2 (two) times daily.    Marland Kitchen aspirin EC 81 MG tablet Take 81 mg by mouth daily.      . carvedilol (COREG)  25 MG tablet Take 25 mg by mouth 2 (two) times daily with a meal.    . fish oil-omega-3 fatty acids 1000 MG capsule Take 2 g by mouth daily.      . furosemide (LASIX) 40 MG tablet Take 20 mg by mouth daily.    . furosemide (LASIX) 40 MG tablet Take one tablet orally on Monday, Tuesday, and Wednesday with your regular daily dose to equal 80 mg daily for three day (06/30/2020-07/02/2020) 3 tablet 0  . ibuprofen (ADVIL,MOTRIN) 200 MG tablet Take 200 mg by mouth every 6 (six) hours as needed. FOR HEADACHES     . levothyroxine (SYNTHROID) 25 MCG tablet Take 25 mcg by mouth daily before breakfast.    . Multiple Vitamins-Minerals (MULTIVITAMINS THER. W/MINERALS) TABS Take 1 tablet by mouth daily.      Marland Kitchen NIFEdipine (PROCARDIA XL/NIFEDICAL XL) 60 MG 24 hr tablet Take 60 mg by mouth daily.    . tamsulosin (FLOMAX) 0.4 MG CAPS capsule Take 0.4 mg by mouth daily.    Marland Kitchen telmisartan (MICARDIS) 40 MG tablet Take 40 mg by mouth daily.      . traMADol-acetaminophen (ULTRACET) 37.5-325 MG per tablet Use 1 or 2 po QID prn pain 30 tablet 0   No current facility-administered medications for this visit.     Allergies:   Patient has no known allergies.   Social History:  The patient  reports that he has never smoked. He has never used smokeless tobacco. He  reports current alcohol use. He reports current drug use. Drug: Marijuana.   Family History:   family history is not on file.    Review of Systems: Review of Systems  Constitutional: Negative.   HENT: Negative.   Respiratory: Negative.   Cardiovascular: Positive for leg swelling.  Gastrointestinal: Negative.   Musculoskeletal: Negative.   Neurological: Negative.   Psychiatric/Behavioral: Negative.   All other systems reviewed and are negative.   PHYSICAL EXAM: VS:  BP 130/66 (BP Location: Right Arm, Patient Position: Sitting, Cuff Size: Normal)   Pulse 71   Ht 5\' 7"  (1.702 m)   Wt 299 lb (135.6 kg)   SpO2 96%   BMI 46.83 kg/m  , BMI Body mass  index is 46.83 kg/m. GEN: Well nourished, well developed, in no acute distress morbidly obese HEENT: normal Neck: no JVD, carotid bruits, or masses Cardiac: RRR; no murmurs, rubs, or gallops, 2+ pitting edema lower extremities Respiratory:  clear to auscultation bilaterally, normal work of breathing GI: soft, nontender, nondistended, + BS MS: no deformity or atrophy Skin: warm and dry, no rash Neuro:  Strength and sensation are intact Psych: euthymic mood, full affect   Recent Labs: 06/25/2020: ALT 9; BUN 15; Creat 1.22; Hemoglobin 13.0; Platelets 224; Potassium 3.8; Sodium 138; TSH 3.45    Lipid Panel Lab Results  Component Value Date   CHOL 112 06/25/2020   HDL 35 (L) 06/25/2020   LDLCALC 63 06/25/2020   TRIG 65 06/25/2020      Wt Readings from Last 3 Encounters:  07/28/20 299 lb (135.6 kg)  07/07/20 (!) 300 lb 12.8 oz (136.4 kg)  06/30/20 299 lb 9.6 oz (135.9 kg)       ASSESSMENT AND PLAN:  Problem List Items Addressed This Visit      Cardiology Problems   Congestive heart failure (HCC)   Relevant Orders   EKG 12-Lead    Other Visit Diagnoses    Morbid obesity ([place])    -  Primary   Lower extremity edema       Relevant Orders   ECHOCARDIOGRAM COMPLETE   Essential hypertension       Relevant Orders   EKG 12-Lead     Lymphedema Recommend he use lymphedema compression pumps 1 hour twice daily, leg elevation, Ace wraps as needed  Chronic diastolic CHF Likely exacerbated by morbid obesity We have ordered echocardiogram, he reports last was done 5 to 6 years ago Need to exclude pulmonary hypertension -For now continue Lasix every other day Creatinine acceptable  Morbid obesity We have encouraged careful diet management in an effort to lose weight. 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 [place], [place]

## 2020-07-28 NOTE — Patient Instructions (Addendum)
Pumps 1 hours twice a day Ace wraps, leg elevation  Medication Instructions:  No changes  Please call with medication list Look for amlodipine and nifedipine  If you need a refill on your cardiac medications before your next appointment, please call your pharmacy.    Lab work: No new labs needed   If you have labs (blood work) drawn today and your tests are completely normal, you will receive your results only by: Marland Kitchen MyChart Message (if you have MyChart) OR . A paper copy in the mail If you have any lab test that is abnormal or we need to change your treatment, we will call you to review the results.   Testing/Procedures:   Echocardiogram for leg swelling Your physician has requested that you have an echocardiogram. Echocardiography is a painless test that uses sound waves to create images of your heart. It provides your doctor with information about the size and shape of your heart and how well your heart's chambers and valves are working. This procedure takes approximately one hour. There are no restrictions for this procedure. You may get an IV, if needed, to receive an ultrasound enhancing agent through to better visualize your heart.    Follow-Up: At [facility], you and your health needs are our priority.  As part of our continuing mission to provide you with exceptional heart care, we have created designated Provider Care Teams.  These Care Teams include your primary Cardiologist (physician) and Advanced Practice Providers (APPs -  Physician Assistants and Nurse Practitioners) who all work together to provide you with the care you need, when you need it.  . You will need a follow up appointment in 3 months  . Providers on your designated Care Team:   . Murray Hodgkins, NP . Christell Faith, PA-C . Marrianne Mood, PA-C  Any Other Special Instructions Will Be Listed Below (If Applicable).  COVID-19 Vaccine Information can be found at:  [email or web address].co.uk For questions related to vaccine distribution or appointments, please email vaccine@[email or web address] or call [phone].

## 2020-07-28 NOTE — Telephone Encounter (Signed)
Yes VO approved

## 2020-07-29 ENCOUNTER — Ambulatory Visit (INDEPENDENT_AMBULATORY_CARE_PROVIDER_SITE_OTHER): Payer: Medicare HMO

## 2020-07-29 ENCOUNTER — Other Ambulatory Visit: Payer: Self-pay

## 2020-07-29 DIAGNOSIS — R600 Localized edema: Secondary | ICD-10-CM

## 2020-07-29 LAB — ECHOCARDIOGRAM COMPLETE
AR max vel: 1.99 cm2
AV Area VTI: 2.16 cm2
AV Area mean vel: 1.97 cm2
AV Mean grad: 10 mmHg
AV Peak grad: 19.5 mmHg
Ao pk vel: 2.21 m/s
Area-P 1/2: 2.69 cm2
S' Lateral: 3.2 cm

## 2020-07-29 MED ORDER — ALLOPURINOL 300 MG PO TABS: 300.0000 mg | ORAL_TABLET | Freq: Every day | ORAL | 0 refills | Status: DC

## 2020-07-29 MED ORDER — PERFLUTREN LIPID MICROSPHERE
1.0000 mL | INTRAVENOUS | Status: AC | PRN
  Administered 2020-07-29: 2 mL via INTRAVENOUS

## 2020-07-29 MED ORDER — TELMISARTAN 40 MG PO TABS: 40.0000 mg | ORAL_TABLET | Freq: Every day | ORAL | 0 refills | Status: DC

## 2020-07-30 ENCOUNTER — Ambulatory Visit
Admission: RE | Admit: 2020-07-30 | Discharge: 2020-07-30 | Disposition: A | Discharge status: Home / Self Care | Payer: Medicare HMO | Source: Ambulatory Visit | Attending: Urology | Admitting: Urology

## 2020-07-30 ENCOUNTER — Other Ambulatory Visit
Admission: RE | Admit: 2020-07-30 | Discharge: 2020-07-30 | Disposition: A | Discharge status: Home / Self Care | Payer: Medicare HMO | Source: Ambulatory Visit | Attending: Urology | Admitting: Urology

## 2020-07-30 ENCOUNTER — Ambulatory Visit
Admission: RE | Admit: 2020-07-30 | Discharge: 2020-07-30 | Disposition: A | Discharge status: Home / Self Care | Payer: Medicare HMO | Attending: Urology | Admitting: Urology

## 2020-07-30 ENCOUNTER — Ambulatory Visit (INDEPENDENT_AMBULATORY_CARE_PROVIDER_SITE_OTHER): Payer: Medicare HMO | Admitting: Urology

## 2020-07-30 ENCOUNTER — Encounter: Payer: Self-pay | Admitting: Family Medicine

## 2020-07-30 ENCOUNTER — Encounter: Payer: Self-pay | Admitting: Urology

## 2020-07-30 ENCOUNTER — Other Ambulatory Visit: Payer: Self-pay

## 2020-07-30 VITALS — BP 135/66 | HR 69 | Ht 67.0 in | Wt 300.0 lb

## 2020-07-30 DIAGNOSIS — Z466 Encounter for fitting and adjustment of urinary device: Secondary | ICD-10-CM

## 2020-07-30 DIAGNOSIS — C61 Malignant neoplasm of prostate: Secondary | ICD-10-CM | POA: Diagnosis not present

## 2020-07-30 DIAGNOSIS — Z8546 Personal history of malignant neoplasm of prostate: Secondary | ICD-10-CM | POA: Insufficient documentation

## 2020-07-30 LAB — PSA: Prostatic Specific Antigen: 41.11 ng/mL — ABNORMAL HIGH (ref 0.00–4.00)

## 2020-07-30 NOTE — Progress Notes (Signed)
   07/30/20 5:01 PM   Terry [name] [birth year] [id number]  CC: Elevated PSA  HPI: I saw Terry [name] in urology clinic today who was referred for an elevated PSA of 32.  He is a very comorbid 79 year old male who recently moved to the area from [place].  He is a very poor historian, but from what I can understand he was hospitalized for multiple months in [place] for " blood poisoning" and at some point had a colon cancer removed, as well as a stent put into his right kidney for unclear reasons.  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  He is unsure when he was diagnosed with prostate cancer, and those records are not currently available to me.  He has not told his daughter who he lives with that he has prostate cancer, as he does not want to worry her.  He has a number of cardiac comorbidities and is on Eliquis and diuretics.  He has urinary frequency and urgency, but denies any significant urinary symptoms.  His daughter joined him for the second half of the clinic visit, and was able to correlate some of the history, but the records are not currently available to me.   Physical Exam: BP 135/66 (BP Location: Left Arm, Patient Position: Sitting, Cuff Size: Large)   Pulse 69   Ht _0  (1.702 m)   Wt 300 lb (136.1 kg)   BMI 46.99 kg/m    Constitutional: Elderly, frail-appearing, in wheelchair Significant lower extremity edema  Laboratory Data: Reviewed in epic Creatinine 1.22, EGFR 65 PSA 32 WBC 6.4 Hematocrit 42 Platelets 224  Pertinent Imaging: None to review  Assessment & Plan:   In summary, he is a 79 year old male who recently moved to the area with a very complex history.  The history is obtained from the patient and his daughter today, and his outside records are not yet available to me.  We will work to obtain these.  Essentially it sounds like he had a ureteral stent  placed for renal failure and obstruction of some type, but it is unclear what the etiology was, secondly he was diagnosed with prostate cancer and sounds like he was on androgen deprivation therapy previously in [place] under the care of a urologist.  We discussed the need to obtain his full history regarding his prostate cancer diagnosis, as well as to understand the etiology of his current ureteral stent, and the possibility of removing this.  We also discussed the possibility of encrustation on the stent which would make removal much more challenging.   We will obtain outside records to get complete history in this very complex patient KUB today to evaluate for stent/encrustation on stent PSA today to evaluate PSA doubling time RTC 1 week to review above  I spent 65 total minutes on the day of the encounter including pre-visit review of the medical record, face-to-face time with the patient, and post visit ordering of labs/imaging/tests.  Nickolas Madrid, MD 07/30/2020  [facility] [address], [place], [place] [address] [phone]

## 2020-07-31 ENCOUNTER — Telehealth: Payer: Self-pay | Admitting: *Deleted

## 2020-07-31 ENCOUNTER — Encounter: Payer: Self-pay | Admitting: *Deleted

## 2020-07-31 ENCOUNTER — Telehealth: Payer: Self-pay

## 2020-07-31 MED ORDER — TELMISARTAN 40 MG PO TABS
40.0000 mg | ORAL_TABLET | Freq: Every day | ORAL | 0 refills | Status: DC
Start: 2020-07-31 — End: 2020-10-20

## 2020-07-31 NOTE — Telephone Encounter (Signed)
Called pt no answer. LM informing pt that he left cane here in our office and he could pick up at his convenience.

## 2020-07-31 NOTE — Telephone Encounter (Signed)
Received request from pharmacy for PA on [employer].   PA submitted.   Dx: I10- HTN  Received immediate determination.   Case ID [id number] approved 07/01/2020- 07/31/2021.

## 2020-08-01 ENCOUNTER — Encounter: Payer: Self-pay | Admitting: Family Medicine

## 2020-08-01 DIAGNOSIS — I509 Heart failure, unspecified: Secondary | ICD-10-CM | POA: Insufficient documentation

## 2020-08-01 DIAGNOSIS — C61 Malignant neoplasm of prostate: Secondary | ICD-10-CM | POA: Insufficient documentation

## 2020-08-01 NOTE — Telephone Encounter (Signed)
Nurse from [facility] calling to report a weight gain over night Pt c/o swelling: STAT is pt has developed SOB within 24 hours  1) How much weight have you gained and in what time span? 3 lbs overnight   2) If swelling, where is the swelling located? Normal edema, leg are wrapped with ace bandages  3) Are you currently taking a fluid pill? Lasix 40 mg 3x/days  4) Are you currently SOB? no  5) Do you have a log of your daily weights (if so, list)?  9/17 303 9/16 299  6) Have you gained 3 pounds in a day or 5 pounds in a week? yes  7) Have you traveled recently? No  Patient states "he ate good" yesterday and believes it may be from that

## 2020-08-04 ENCOUNTER — Other Ambulatory Visit: Payer: Self-pay

## 2020-08-04 ENCOUNTER — Telehealth (INDEPENDENT_AMBULATORY_CARE_PROVIDER_SITE_OTHER): Payer: Medicare HMO | Admitting: Family Medicine

## 2020-08-04 DIAGNOSIS — N189 Chronic kidney disease, unspecified: Secondary | ICD-10-CM | POA: Diagnosis not present

## 2020-08-04 DIAGNOSIS — I5032 Chronic diastolic (congestive) heart failure: Secondary | ICD-10-CM

## 2020-08-04 DIAGNOSIS — Z7689 Persons encountering health services in other specified circumstances: Secondary | ICD-10-CM | POA: Diagnosis not present

## 2020-08-04 DIAGNOSIS — R972 Elevated prostate specific antigen [PSA]: Secondary | ICD-10-CM

## 2020-08-04 DIAGNOSIS — I480 Paroxysmal atrial fibrillation: Secondary | ICD-10-CM

## 2020-08-04 DIAGNOSIS — I10 Essential (primary) hypertension: Secondary | ICD-10-CM | POA: Diagnosis not present

## 2020-08-04 NOTE — Progress Notes (Signed)
Subjective:    Patient ID: Terry [name], male    DOB: 1941-02-28, 79 y.o.   MRN: [id number]  HPI Patient is a 79 year old male being seen today as a telephone visit.  He is establishing care with me.  He previously has seen my partner who has left the area.  Both he and his daughter who provides the history consent to be seen via telephone.  Phone call began at 206.  Phone call concluded at 220.  Patient has a history of congestive heart failure.  Reviewing his chart he has seen Dr. Rockey Situ.  He has been diagnosed with diastolic congestive heart failure.  He also has a history of paroxysmal atrial fibrillation for which he is on chronic anticoagulation.  The patient has a history of stage III chronic kidney disease.  He also has a history of hypertension.  Recently his PSA was found to be greater than 30.  Reviewing his records from his urologist he has a history of a urethral stent that was placed for a "unknown reason".  The urologist was trying to get outside records.  They repeated a PSA and found it rising to 40.  This suggest underlying prostate cancer.  Based on my review of his chart it appears that treatment is yet to be determined.  Daughter has been out of work since Monday, September 13.  She is returning to work on Wednesday, September 29.  She has been out of work continuously helping to move her father from [place] to this area so that she can supervise his care more closely.  She is also been attending numerous doctors appointments including his cardiologist, his urologist.  This was all in an attempt to help establish care locally.  Therefore she needed continuous leave during that timeframe in order to arrange follow-up and establish with a specialist and also to move him into his home.  Moving forward she will need intermittent leave.  She anticipates that she will need up to 10 days a month depending upon the situation.  This would depend upon whether he is admitted to the hospital or  needs care at home.  She states that she has to help with his ADLs.  She helps to prepare all of his meals.  She has to place his compression hose on in the morning and remove them at night.  The patient is able to walk only using a walker or cane.  He is able to go to the bathroom however he is incontinent of urine.  She has to help clean him up from time to time and empty his bedside commode.  He is able to take a bath however he has to be helped onto a bath chair and supervise transitioning into the bathtub and out.  He is a very high fall risk and has fallen once this week already. Past Medical History:  Diagnosis Date  . Cancer ([place])    Phreesia 08/04/2020  . CHF, chronic (HCC)    diastolic  . CKD (chronic kidney disease) stage 3, GFR 30-59 ml/min   . Hypertension   . Paroxysmal atrial fibrillation (HCC)   . Prostate cancer [facility])    Past Surgical History:  Procedure Laterality Date  . COLON SURGERY N/A    Phreesia 08/04/2020   Current Outpatient Medications on File Prior to Visit  Medication Sig Dispense Refill  . allopurinol (ZYLOPRIM) 300 MG tablet Take 1 tablet (300 mg total) by mouth daily. 90 tablet 0  . apixaban (ELIQUIS)  5 MG TABS tablet Take 5 mg by mouth 2 (two) times daily.    Marland Kitchen aspirin EC 81 MG tablet Take 81 mg by mouth daily.      . carvedilol (COREG) 25 MG tablet Take 25 mg by mouth 2 (two) times daily with a meal.    . fish oil-omega-3 fatty acids 1000 MG capsule Take 2 g by mouth daily.      . furosemide (LASIX) 40 MG tablet Take 20 mg by mouth daily.    . furosemide (LASIX) 40 MG tablet Take one tablet orally on Monday, Tuesday, and Wednesday with your regular daily dose to equal 80 mg daily for three day (06/30/2020-07/02/2020) 3 tablet 0  . ibuprofen (ADVIL,MOTRIN) 200 MG tablet Take 200 mg by mouth every 6 (six) hours as needed. FOR HEADACHES     . levothyroxine (SYNTHROID) 25 MCG tablet Take 25 mcg by mouth daily before breakfast.    . Multiple Vitamins-Minerals  (MULTIVITAMINS THER. W/MINERALS) TABS Take 1 tablet by mouth daily.      Marland Kitchen NIFEdipine (ADALAT CC) 60 MG 24 hr tablet Take by mouth.    . tamsulosin (FLOMAX) 0.4 MG CAPS capsule Take 0.4 mg by mouth daily.    Marland Kitchen telmisartan (MICARDIS) 40 MG tablet Take 1 tablet (40 mg total) by mouth daily. 90 tablet 0  . traMADol-acetaminophen (ULTRACET) 37.5-325 MG per tablet Use 1 or 2 po QID prn pain 30 tablet 0   No current facility-administered medications on file prior to visit.   Allergies  Allergen Reactions  . Lisinopril     FACIAL SWELLING   Social History   Socioeconomic History  . Marital status: Widowed    Spouse name: Not on file  . Number of children: Not on file  . Years of education: Not on file  . Highest education level: Not on file  Occupational History  . Not on file  Tobacco Use  . Smoking status: Never Smoker  . Smokeless tobacco: Never Used  Substance and Sexual Activity  . Alcohol use: Yes    Comment: rarely  . Drug use: Yes    Types: Marijuana  . Sexual activity: Yes  Other Topics Concern  . Not on file  Social History Narrative  . Not on file   Social Determinants of Health   Financial Resource Strain:   . Difficulty of Paying Living Expenses: Not on file  Food Insecurity:   . Worried About Charity fundraiser in the Last Year: Not on file  . Ran Out of Food in the Last Year: Not on file  Transportation Needs:   . Lack of Transportation (Medical): Not on file  . Lack of Transportation (Non-Medical): Not on file  Physical Activity:   . Days of Exercise per Week: Not on file  . Minutes of Exercise per Session: Not on file  Stress:   . Feeling of Stress : Not on file  Social Connections:   . Frequency of Communication with Friends and Family: Not on file  . Frequency of Social Gatherings with Friends and Family: Not on file  . Attends Religious Services: Not on file  . Active Member of Clubs or Organizations: Not on file  . Attends Archivist  Meetings: Not on file  . Marital Status: Not on file  Intimate Partner Violence:   . Fear of Current or Ex-Partner: Not on file  . Emotionally Abused: Not on file  . Physically Abused: Not on file  . Sexually  Abused: Not on file    Past Medical History:  Diagnosis Date  . Cancer ([place])    Phreesia 08/04/2020  . CHF, chronic (HCC)    diastolic  . CKD (chronic kidney disease) stage 3, GFR 30-59 ml/min   . Hypertension   . Paroxysmal atrial fibrillation (HCC)   . Prostate cancer [facility])       Review of Systems  All other systems reviewed and are negative.      Objective:   Physical Exam        Assessment & Plan:  Chronic kidney disease, unspecified CKD stage  Elevated PSA  Hypertension, unspecified type  Encounter to establish care  Chronic diastolic congestive heart failure ([place])  Paroxysmal atrial fibrillation ([place])  Patient has a follow-up appointment with me scheduled in 2 months.  I will complete FMLA for his daughter.  She will need continuous leave from September 13 through September 29.  She will return to work on September 29.  This was done to help move the father into his new home.  He recently relocated from [place].  She also had to attend several appointments with his urologist and cardiologist in the last few weeks to establish care.  Moving forward, she will have to have intermittent leave.  Is difficult to predict the frequency however she may require up to 10 days a month most likely less.  This will be to take her father to doctor's appointments.  It would also be to intermittently provide care when he has wounds on his legs, or to assist with ADLs.  Moving forward they are going to try to arrange home health nursing to help provide supervision at home and manage his medication and assist with his ADLs so that she will be able to return to work however this is yet to be arranged.

## 2020-08-07 ENCOUNTER — Encounter: Payer: Self-pay | Admitting: Urology

## 2020-08-07 ENCOUNTER — Ambulatory Visit (INDEPENDENT_AMBULATORY_CARE_PROVIDER_SITE_OTHER): Payer: Medicare HMO | Admitting: Urology

## 2020-08-07 ENCOUNTER — Other Ambulatory Visit: Payer: Self-pay

## 2020-08-07 VITALS — BP 146/67 | HR 48 | Ht 67.0 in | Wt 300.0 lb

## 2020-08-07 DIAGNOSIS — N200 Calculus of kidney: Secondary | ICD-10-CM

## 2020-08-07 DIAGNOSIS — C61 Malignant neoplasm of prostate: Secondary | ICD-10-CM

## 2020-08-07 NOTE — Progress Notes (Signed)
08/07/2020 12:53 PM   Terry [name] [birth year] [id number]  Reason for visit: Right ureteral stent, prostate cancer  HPI: Briefly, he is a very comorbid 79 year old male who recently moved to the area from [place].  I originally met him on 07/30/2020.  He is a very poor historian, and the majority of the history has been obtained from the outside records that we were able to obtain from [facility].  He has an extensive cardiac history and CHF, and is currently on Eliquis.  He was hospitalized in November 2018 after being found down likely secondary to sepsis of urinary source with a 1.5 cm right mid ureteral stone, and underwent urgent ureteral stent placement with urology at that time.  He also underwent an exploratory laparotomy for a colon cancer at some point during that hospitalization as well.     He was also found to have an elevated PSA at some point prior to 2018 with a different urologist in [place] Dr. Vedia Coffer.  I was unable to get the records yet from his office, however I was able to get a hold of Dr. Burr Medico and have a conversation regarding Mr. Batta.  He was last seen in May 2018 and was on ADT for metastatic prostate cancer(last dose May 2018, PSA was 1.9).  His prostate biopsy showed Gleason score 5+4 = 9 prostate adenocarcinoma with a 155 g prostate, and metastatic disease with sclerotic lesions in the pubic rami.   PSA on 06/25/2020 was 32.9, and increased to 41.1 on 07/30/2020.  The patient continues to keep his metastatic prostate cancer diagnosis hidden from his family and daughter, and he refuses to include them in our conversation today.  He states he has been praying about his prostate cancer, which has been keeping him healthy.  I had another very frank and long conversation with the patient today about his numerous medical problems including metastatic prostate cancer, as well as a persistent mid ureteral stone with a right ureteral stent in place since 2018.  He  would be a very high risk operative candidate, even for ureteroscopy and laser lithotripsy with stent exchange, in addition he would be at very high risk for having a significantly encrusted stent that would make surgery prolonged and very challenging, finally with his Eliquis he would be high risk for bleeding complications with a complicated ureteroscopy, as well as with his massive 155 g plus prostate with prostate cancer.  I recommended starting with staging imaging with a CT abdomen pelvis with and without contrast, as well as a bone scan for better evaluation of his extent of metastatic prostate cancer and treatment options moving forward.  He would like to resume ADT which is likely reasonable pending imaging findings and goals of care.  As his renal function is okay with a creatinine of 1.22, EGFR of 65, it may be safer to observe him with his stent in place as he has tolerated this well and not had recurrent infections as far as we know, as opposed to considering a stent exchange or definitively managing his right ureteral stone.  I think he has a good understanding that he has a number of significant medical problems including his metastatic prostate cancer, and understands the importance of further imaging prior to determining a treatment strategy for his very complicated issues.  It is also very challenging to care for him when he excludes his family for many of the medical decision making.   -CT abdomen pelvis with and without  contrast and bone scan to evaluate extent of metastatic prostate cancer, as well as to evaluate stone burden -Consider oncology/hospice referral pending above results -RTC 2 weeks to review above  I spent 50 total minutes on the day of the encounter including pre-visit review of the medical record, face-to-face time with the patient, and post visit ordering of labs/imaging/tests.  Billey Co, [facility] [address], [place], [place] [address] [phone]

## 2020-08-08 NOTE — Telephone Encounter (Signed)
Attempted to call patient to discuss [place].  Mobile number said call cannot be completed as dialed. Please try again later.  Home number rang several times with no answer or voicemail.

## 2020-08-12 ENCOUNTER — Encounter: Payer: Self-pay | Admitting: Family Medicine

## 2020-08-14 ENCOUNTER — Telehealth: Payer: Self-pay

## 2020-08-14 NOTE — Telephone Encounter (Signed)
Called insurance for PA on Leon 240mg  [id number] cpt [place] with Sonia Baller [phone]) no PA needed for cpt code and is covered under plan ref [place] [phone] spoke with Amber and [phone] Auth# [id number] good from 08-14-20 through 02-11-2021

## 2020-08-21 ENCOUNTER — Encounter
Admission: RE | Admit: 2020-08-21 | Discharge: 2020-08-21 | Disposition: A | Discharge status: Home / Self Care | Payer: Medicare HMO | Source: Ambulatory Visit | Attending: Urology | Admitting: Urology

## 2020-08-21 ENCOUNTER — Other Ambulatory Visit: Payer: Self-pay

## 2020-08-21 ENCOUNTER — Encounter: Payer: Self-pay | Admitting: Urology

## 2020-08-21 ENCOUNTER — Ambulatory Visit
Admission: RE | Admit: 2020-08-21 | Discharge: 2020-08-21 | Disposition: A | Discharge status: Home / Self Care | Payer: Medicare HMO | Source: Ambulatory Visit | Attending: Urology | Admitting: Urology

## 2020-08-21 ENCOUNTER — Ambulatory Visit (INDEPENDENT_AMBULATORY_CARE_PROVIDER_SITE_OTHER): Payer: Medicare HMO | Admitting: Urology

## 2020-08-21 VITALS — BP 190/90 | HR 55 | Ht 67.0 in | Wt 300.0 lb

## 2020-08-21 DIAGNOSIS — C61 Malignant neoplasm of prostate: Secondary | ICD-10-CM | POA: Diagnosis not present

## 2020-08-21 DIAGNOSIS — N200 Calculus of kidney: Secondary | ICD-10-CM | POA: Diagnosis not present

## 2020-08-21 DIAGNOSIS — R300 Dysuria: Secondary | ICD-10-CM | POA: Diagnosis not present

## 2020-08-21 DIAGNOSIS — Z960 Presence of urogenital implants: Secondary | ICD-10-CM | POA: Diagnosis not present

## 2020-08-21 LAB — MICROSCOPIC EXAMINATION
RBC, Urine: >30 /hpf — AB (ref 0–2)
WBC, UA: >30 /hpf — AB (ref 0–5)

## 2020-08-21 LAB — URINALYSIS, COMPLETE
Bilirubin, UA: NEGATIVE
Glucose, UA: NEGATIVE
Ketones, UA: NEGATIVE
Nitrite, UA: NEGATIVE
Specific Gravity, UA: 1.02 (ref 1.005–1.030)
Urobilinogen, Ur: 0.2 mg/dL (ref 0.2–1.0)
pH, UA: 7.5 (ref 5.0–7.5)

## 2020-08-21 LAB — POCT I-STAT CREATININE: Creatinine, Ser: 0.9 mg/dL (ref 0.61–1.24)

## 2020-08-21 MED ORDER — TECHNETIUM TC 99M MEDRONATE IV KIT
20.0000 | PACK | Freq: Once | INTRAVENOUS | Status: AC | PRN
  Administered 2020-08-21: 20.839 via INTRAVENOUS

## 2020-08-21 MED ORDER — DEGARELIX ACETATE(240 MG DOSE) 120 MG/VIAL ~~LOC~~ SOLR: 240.0000 mg | Freq: Once | SUBCUTANEOUS | Status: DC

## 2020-08-21 MED ORDER — DEGARELIX ACETATE(240 MG DOSE) 120 MG/VIAL ~~LOC~~ SOLR
240.0000 mg | Freq: Once | SUBCUTANEOUS | Status: AC
  Administered 2020-08-21: 240 mg via SUBCUTANEOUS

## 2020-08-21 MED ORDER — IOHEXOL 350 MG/ML SOLN
100.0000 mL | Freq: Once | INTRAVENOUS | Status: AC | PRN
  Administered 2020-08-21: 100 mL via INTRAVENOUS

## 2020-08-21 NOTE — Progress Notes (Signed)
Firmagon Sub Q Injection  Due to Prostate Cancer patient is present today for a Firmagon Injection.   Medication: Mills Koller (Degarelix)  Dose: 240mg  Location: Bilateral upper abdomen Lot: [id number] Exp: 03/2022 Patient tolerated well, no complications were noted.  Performed by: Gordy Clement, [place], CMA   Follow up: 3 months

## 2020-08-21 NOTE — Progress Notes (Signed)
08/21/2020 5:58 PM   Terry [name] Dec 23, 1940 [id number]  Reason for visit: Follow up prostate cancer, right ureteral stone and stent  HPI: Briefly, he is a very comorbid 79 year old male who recently moved to the area from [place].  I originally met him on 07/30/2020.  He is a very poor historian, and the majority of the history has been obtained from the outside records that we were able to obtain from [facility].  He has an extensive cardiac history and CHF, and is currently on Eliquis.  He was hospitalized in November 2018 after being found down likely secondary to sepsis of urinary source with a 1 cm right mid ureteral stone, and underwent urgent ureteral stent placement with urology at that time.  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 He also underwent an exploratory laparotomy for a colon cancer at some point during that hospitalization as well.     He was also found to have an elevated PSA at some point prior to 2018 with a different urologist in [place] Dr. Vedia Coffer. He was last seen in May 2018 and was on ADT for metastatic prostate cancer(last dose May 2018, PSA was 1.9).  His prostate biopsy showed Gleason score 5+4 = 9 prostate adenocarcinoma with a 155 g prostate, and metastatic disease with sclerotic lesions in the pubic rami.   PSA on 06/25/2020 was 32.9, and increased to 41.1 on 07/30/2020, both off of ADT.  He continues to keep his prostate cancer diagnosis hidden from his family and daughter, and does not want them to be involved in any conversations regarding his prostate cancer.  He has mild to moderate urinary symptoms of urgency, frequency, and incontinence.  Some of this may be secondary to his high-dose diuretic, but may be secondary to his indwelling ureteral stent and possible UTI.  Urinalysis today is equivocal with greater than 30 WBCs, greater than 30 RBCs, few bacteria.  Will send for culture.  At our last visit, he  had agreed to repeat staging imaging with a bone scan and CT with contrast.  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  I think he has a good understanding that he has a number of significant medical problems including his metastatic prostate cancer.  I had another frank conversation with the patient today about his multiple major medical problems and poor health.  I am concerned that he would not survive a ureteroscopy, laser lithotripsy, and stent exchange, especially since that stent has been present for 3 years and is likely to be extremely encrusted.  Additionally he would be at high risk for bleeding with his Eliquis, and significant risk for perioperative complications with his poorly controlled CHF.  In terms of his prostate cancer, he would like to go back on ADT.  We discussed that this can slow down the spread of cancer and symptoms, and I also recommended considering an oncology consult for consideration of other options, however he would like to hold off at this time.  I do not think he would be a candidate for other treatments currently with his  numerous comorbidities.  After a long conversation today, he is in agreement to resume ADT for his metastatic prostate cancer, with close follow-up in 3 months to rediscuss consideration of ureteroscopy on the right side for his right ureteral stone with stent that has been in place for 3 years.  Unfortunately there is no easy way to manage his concurrent urologic and numerous medical problems without significant amount of operative risk.  We will call with urine culture  results today, start antibiotics for UTI if infected Follow-up bone scan results RTC 3 months to rediscuss  I spent 35 total minutes on the day of the encounter including pre-visit review of the medical record, face-to-face time with the patient, and post visit ordering of labs/imaging/tests.  Billey Co, [facility] [address] [place], [place] [address] [phone]

## 2020-08-22 NOTE — Telephone Encounter (Signed)
He has had metastatic prostate cancer since originally diagnosed in 2018 in [place].  He does not want to see oncology because he does not want his family to know about his diagnosis.  He has not mentioned pain to me before aside from his urinary symptoms from stent that has also been in since 2018.  Typically would start with tylenol for pain, as should avoid NSAIDs with history of CKD. If he needs chronic narcotics then he needs to get plugged in with a PCP here or pain management for long term opioids.   We will call with urine culture results if he needs antibiotics for UTI.   Thanks Nickolas Madrid, MD 08/22/2020

## 2020-08-24 LAB — CULTURE, URINE COMPREHENSIVE

## 2020-08-26 ENCOUNTER — Telehealth: Payer: Self-pay | Admitting: Cardiovascular Disease

## 2020-08-26 ENCOUNTER — Other Ambulatory Visit: Payer: Self-pay

## 2020-08-26 ENCOUNTER — Telehealth: Payer: Self-pay

## 2020-08-26 DIAGNOSIS — A491 Streptococcal infection, unspecified site: Secondary | ICD-10-CM

## 2020-08-26 MED ORDER — AMOXICILLIN 500 MG PO TABS: 500.0000 mg | ORAL_TABLET | Freq: Two times a day (BID) | ORAL | 0 refills | Status: AC

## 2020-08-26 NOTE — Telephone Encounter (Signed)
Pt c/o medication issue:  1. Name of Medication: Torsemide   2. How are you currently taking this medication (dosage and times per day)? Not taking in a week  Also not weighing himself  3. Are you having a reaction (difficulty breathing--STAT)? no  4. What is your medication issue? Per Home Health nurse patient with some wheezing LL Lobe BLE 3 + edema not wrapping legs or wearing Compression device patient c/o sob vss

## 2020-08-26 NOTE — Telephone Encounter (Signed)
Spoke with Joelene Millin nurse with home health that currently is seeing patient and she states patient is noncompliant. She reported that he has not been taking his furosemide due to excessive urination. She states that he does not wear his compression pumps as well. He is now wheezing, lower extremity swelling, and not checking his daily weights. She reports that he does have a scale but also not weighing daily. She states today's weight is 300.7 pounds. She stated that he reports elevating feet but he has not been doing that during her visits. She has alerted patients daughter who is also a Marine scientist and she is aware as well. Home health nurse states that she has filled his pill box but in the past he has been skipping around and taking pills out so she is not sure if he is taking them as filled. She did feel that he was somewhat depressed and she did call his primary care provider to see if they could prescribe something for that. She was appreciative for the call back and reinforced with patient reasons for medication, weights, compression, and leg elevation. Family is aware as well and she called to make [place] aware as well.

## 2020-08-26 NOTE — Telephone Encounter (Signed)
Kim from [facility] called and wanted to inform Dr Dennard Schaumann that Terry [name] is being none compliant with his medications he is not taking them. He has informed the nurse that he is having some depression. Nurse is wanting to know if something can be prescribed for depression.   Kim call back [phone]

## 2020-08-26 NOTE — Telephone Encounter (Signed)
Lets do amoxicillin 500mg  BID x 10 days, thanks   Nickolas Madrid, MD  08/26/2020   RX sent in. Pt informed.

## 2020-08-27 NOTE — Telephone Encounter (Signed)
Would push to moderate his fluid intake then if he is not taking medications on a regular basis Needs lymphedema compression pumps daily if not twice a day Would try to get diuretics and at least every other day if possible

## 2020-08-28 ENCOUNTER — Telehealth: Payer: Self-pay

## 2020-08-28 NOTE — Telephone Encounter (Signed)
Terry [name] from [facility] calling to follow up with Message on Tuesday and also his lungs are showing wheezing and wanting to know if something to be prescribed for his wheezing.   Terry [name] call back [phone]

## 2020-08-28 NOTE — Telephone Encounter (Signed)
Please Advise

## 2020-08-28 NOTE — Telephone Encounter (Signed)
Monique from [facility] called for verbal orders. 2 times a week for 3 weeks affect 09-01-2020 for Physical Therapy

## 2020-08-28 NOTE — Telephone Encounter (Signed)
Spoke with home health nurse and reviewed provider recommendations. She will try to continue working with him on these instructions with no further questions at this time.

## 2020-08-29 NOTE — Telephone Encounter (Signed)
I have never seen this patient before.  I really need to examine him before I prescribe any additional medication.

## 2020-08-29 NOTE — Telephone Encounter (Signed)
Again, I have never seen this patient before and I need to examine him before I will prescribe any additional medications.

## 2020-08-29 NOTE — Telephone Encounter (Signed)
Please call back and give verbal orders.

## 2020-09-01 NOTE — Telephone Encounter (Signed)
LVM for Terry [name] for [facility] and told her that Dr. Dennard Schaumann stated that he has never seen this pt and will need to make an appt to be examined.

## 2020-09-01 NOTE — Telephone Encounter (Signed)
LVM for Terry [name] at this # [phone] to for forth with the verbal orders for [facility].

## 2020-09-03 ENCOUNTER — Encounter: Payer: Self-pay | Admitting: Family Medicine

## 2020-09-03 NOTE — Telephone Encounter (Signed)
Error

## 2020-09-12 ENCOUNTER — Other Ambulatory Visit: Payer: Self-pay

## 2020-09-12 ENCOUNTER — Ambulatory Visit (INDEPENDENT_AMBULATORY_CARE_PROVIDER_SITE_OTHER): Payer: Medicare HMO | Admitting: Family Medicine

## 2020-09-12 ENCOUNTER — Other Ambulatory Visit: Payer: Self-pay | Admitting: Family Medicine

## 2020-09-12 ENCOUNTER — Encounter: Payer: Self-pay | Admitting: Family Medicine

## 2020-09-12 ENCOUNTER — Telehealth: Payer: Self-pay

## 2020-09-12 VITALS — BP 140/90 | HR 81 | Temp 98.6°F | Ht 67.0 in | Wt 303.0 lb

## 2020-09-12 DIAGNOSIS — I480 Paroxysmal atrial fibrillation: Secondary | ICD-10-CM

## 2020-09-12 DIAGNOSIS — C7951 Secondary malignant neoplasm of bone: Secondary | ICD-10-CM

## 2020-09-12 DIAGNOSIS — N189 Chronic kidney disease, unspecified: Secondary | ICD-10-CM

## 2020-09-12 DIAGNOSIS — R600 Localized edema: Secondary | ICD-10-CM | POA: Diagnosis not present

## 2020-09-12 DIAGNOSIS — M25562 Pain in left knee: Secondary | ICD-10-CM

## 2020-09-12 DIAGNOSIS — C61 Malignant neoplasm of prostate: Secondary | ICD-10-CM

## 2020-09-12 DIAGNOSIS — G8929 Other chronic pain: Secondary | ICD-10-CM

## 2020-09-12 DIAGNOSIS — I5032 Chronic diastolic (congestive) heart failure: Secondary | ICD-10-CM

## 2020-09-12 DIAGNOSIS — M25561 Pain in right knee: Secondary | ICD-10-CM

## 2020-09-12 MED ORDER — TRAMADOL HCL 50 MG PO TABS: 50.0000 mg | ORAL_TABLET | Freq: Two times a day (BID) | ORAL | 0 refills | Status: AC

## 2020-09-12 NOTE — Telephone Encounter (Signed)
Mr Micco daughter is on the phone and is anger about something, not sure !

## 2020-09-12 NOTE — Progress Notes (Signed)
Subjective:    Patient ID: Terry [name], male    DOB: 02-22-1941, 79 y.o.   MRN: [id number]  HPI Patient has a history of diastolic congestive heart failure.  Reviewing his chart he has seen Dr. Rockey Situ.  However phone records indicate that he is not being compliant with his fluid pills.  He also has a history of paroxysmal atrial fibrillation for which he is on chronic anticoagulation.  The patient has a history of stage III chronic kidney disease.  He also has a history of hypertension.  Reviewing his urology office visit from October 7, the patient has metastatic prostate cancer.  See bone scan results below: IMPRESSION: 1. Subtle focal uptake over the right inferior pubic ramus correlating with sclerotic lesion on CT and likely due to metastatic disease. Recent small sclerotic areas over the lumbar spine and sacrum on recent CT not well visualized on bone scan.  They have determined to start him on androgen deprivation therapy and reassess the patient in 3 months.  At the present time they are monitoring his right ureteral stent as I believe he is too high of an operative wrist to go in and try to remove it.  Patient is here today for follow-up with me.  Recently a home health nurse mentioned that they heard wheezing in his lungs.  He has no history of smoking however he states that he did smoke a fair amount of marijuana when he was younger.  However today on exam, the patient has no audible wheezing.  He does have bibasilar crackles on both sides.  He also has +1 pitting edema along with fibrotic changes in both legs consistent with chronic lymphedema.  He states that he has not been taking his fluid pill except maybe 2 days a week.  He is certainly not taking Lasix 20 mg a day.  Instead he takes 40 mg 1 or 2 days a week as he deems fit.  He does this only because it causes urinary frequency and he hates to have to go to the bathroom so much.  He denies any shortness of breath on exam today.   He denies any fever or chest pain or purulent sputum or productive cough or pleurisy.  Blood pressures acceptable.  He does complain of bilateral knee pain.  He denies any pain in his bones other than his knees.  He does not complain of any pain in his right hip or right gluteal area related to the prostate cancer.  His biggest pain is in his knees and he takes ibuprofen every day for the pain in his knees.  He has underlying chronic kidney disease and is also on anticoagulation for atrial fibrillation.  Therefore I was very clear that he cannot take any over-the-counter NSAID.  His daughter was requesting palliative care to help manage pain however at this point I think the patient would simply benefit from a low-dose tramadol 1 or 2 times a day for the pain in his knees.  I do not feel that he requires hospice at this time.  I discussed this with the patient and he agrees.  He is here today walking under his own power using a cane.  He certainly does not appear bedridden or in a terminal phase of his cancer.   Past Medical History:  Diagnosis Date  . Cancer ([place])    Phreesia 08/04/2020  . CHF, chronic (HCC)    diastolic  . CKD (chronic kidney disease) stage 3, GFR 30-59  ml/min ([place])   . Hypertension   . Paroxysmal atrial fibrillation (HCC)   . Prostate cancer [facility])    Past Surgical History:  Procedure Laterality Date  . COLON SURGERY N/A    Phreesia 08/04/2020   Current Outpatient Medications on File Prior to Visit  Medication Sig Dispense Refill  . allopurinol (ZYLOPRIM) 300 MG tablet Take 1 tablet (300 mg total) by mouth daily. 90 tablet 0  . apixaban (ELIQUIS) 5 MG TABS tablet Take 5 mg by mouth 2 (two) times daily.    Marland Kitchen aspirin EC 81 MG tablet Take 81 mg by mouth daily.      . carvedilol (COREG) 25 MG tablet Take 25 mg by mouth 2 (two) times daily with a meal.    . fish oil-omega-3 fatty acids 1000 MG capsule Take 2 g by mouth daily.      . furosemide (LASIX) 40 MG tablet Take 20 mg  by mouth daily.    . furosemide (LASIX) 40 MG tablet Take one tablet orally on Monday, Tuesday, and Wednesday with your regular daily dose to equal 80 mg daily for three day (06/30/2020-07/02/2020) 3 tablet 0  . ibuprofen (ADVIL,MOTRIN) 200 MG tablet Take 200 mg by mouth every 6 (six) hours as needed. FOR HEADACHES     . levothyroxine (SYNTHROID) 25 MCG tablet Take 25 mcg by mouth daily before breakfast.    . Multiple Vitamins-Minerals (MULTIVITAMINS THER. W/MINERALS) TABS Take 1 tablet by mouth daily.      . tamsulosin (FLOMAX) 0.4 MG CAPS capsule Take 0.4 mg by mouth daily.    Marland Kitchen telmisartan (MICARDIS) 40 MG tablet Take 1 tablet (40 mg total) by mouth daily. 90 tablet 0  . traMADol-acetaminophen (ULTRACET) 37.5-325 MG per tablet Use 1 or 2 po QID prn pain 30 tablet 0   No current facility-administered medications on file prior to visit.   Allergies  Allergen Reactions  . Lisinopril     FACIAL SWELLING   Social History   Socioeconomic History  . Marital status: Widowed    Spouse name: Not on file  . Number of children: Not on file  . Years of education: Not on file  . Highest education level: Not on file  Occupational History  . Not on file  Tobacco Use  . Smoking status: Never Smoker  . Smokeless tobacco: Never Used  Substance and Sexual Activity  . Alcohol use: Yes    Comment: rarely  . Drug use: Yes    Types: Marijuana  . Sexual activity: Yes  Other Topics Concern  . Not on file  Social History Narrative  . Not on file   Social Determinants of Health   Financial Resource Strain:   . Difficulty of Paying Living Expenses: Not on file  Food Insecurity:   . Worried About Charity fundraiser in the Last Year: Not on file  . Ran Out of Food in the Last Year: Not on file  Transportation Needs:   . Lack of Transportation (Medical): Not on file  . Lack of Transportation (Non-Medical): Not on file  Physical Activity:   . Days of Exercise per Week: Not on file  . Minutes  of Exercise per Session: Not on file  Stress:   . Feeling of Stress : Not on file  Social Connections:   . Frequency of Communication with Friends and Family: Not on file  . Frequency of Social Gatherings with Friends and Family: Not on file  . Attends Religious Services: Not  on file  . Active Member of Clubs or Organizations: Not on file  . Attends Archivist Meetings: Not on file  . Marital Status: Not on file  Intimate Partner Violence:   . Fear of Current or Ex-Partner: Not on file  . Emotionally Abused: Not on file  . Physically Abused: Not on file  . Sexually Abused: Not on file    Past Medical History:  Diagnosis Date  . Cancer ([place])    Phreesia 08/04/2020  . CHF, chronic (HCC)    diastolic  . CKD (chronic kidney disease) stage 3, GFR 30-59 ml/min (HCC)   . Hypertension   . Paroxysmal atrial fibrillation (HCC)   . Prostate cancer [facility])       Review of Systems  All other systems reviewed and are negative.      Objective:   Physical Exam Vitals reviewed.  Constitutional:      General: He is not in acute distress.    Appearance: He is obese. He is not ill-appearing, toxic-appearing or diaphoretic.  Cardiovascular:     Rate and Rhythm: Normal rate and regular rhythm.  Pulmonary:     Effort: Pulmonary effort is normal. No respiratory distress.     Breath sounds: Rales present. No wheezing.  Abdominal:     General: Abdomen is flat. Bowel sounds are normal.     Palpations: Abdomen is soft.  Musculoskeletal:     Right knee: Decreased range of motion. Tenderness present.     Left knee: Decreased range of motion. Tenderness present.     Right lower leg: Edema present.     Left lower leg: Edema present.  Neurological:     Mental Status: He is alert.           Assessment & Plan:  Chronic kidney disease, unspecified CKD stage - Plan: CBC with Differential/Platelet, COMPLETE METABOLIC PANEL WITH GFR  Paroxysmal atrial fibrillation (HCC)  Chronic  diastolic congestive heart failure (HCC)  Bilateral lower extremity edema  Chronic pain of both knees   Patient denies palliative care consult.  He would like to try tramadol 50 mg twice daily at home for knee pain.  I believe this is appropriate.  I strongly emphasized the need to stay away from ibuprofen due to his chronic kidney disease as well as his chronic anticoagulation.  Today on exam he is in normal sinus rhythm however he is appropriately anticoagulated and his heart rate is well controlled.  I urged compliance with his Lasix.  I have asked him to try to take 40 mg at least 3 to 4 days a week.  Also implored him to use his compression wraps for his lymphedema.  I explained that this helps to move fluid from the tissue into the blood vessels where the diuretics can be more effective and help him diurese which I believe would help his weight, his leg swelling, his lymphedema, and his breathing.  Check renal function today along with a CBC.

## 2020-09-13 LAB — CBC WITH DIFFERENTIAL/PLATELET
Absolute Monocytes: 847 cells/uL (ref 200–950)
Basophils Absolute: 28 cells/uL (ref 0–200)
Basophils Relative: 0.4 %
Eosinophils Absolute: 210 cells/uL (ref 15–500)
Eosinophils Relative: 3 %
HCT: 41.2 % (ref 38.5–50.0)
Hemoglobin: 12.3 g/dL — ABNORMAL LOW (ref 13.2–17.1)
Lymphs Abs: 924 cells/uL (ref 850–3900)
MCH: 23.7 pg — ABNORMAL LOW (ref 27.0–33.0)
MCHC: 29.9 g/dL — ABNORMAL LOW (ref 32.0–36.0)
MCV: 79.2 fL — ABNORMAL LOW (ref 80.0–100.0)
MPV: 11.1 fL (ref 7.5–12.5)
Monocytes Relative: 12.1 %
Neutro Abs: 4991 cells/uL (ref 1500–7800)
Neutrophils Relative %: 71.3 %
Platelets: 240 10*3/uL (ref 140–400)
RBC: 5.2 10*6/uL (ref 4.20–5.80)
RDW: 16.3 % — ABNORMAL HIGH (ref 11.0–15.0)
Total Lymphocyte: 13.2 %
WBC: 7 10*3/uL (ref 3.8–10.8)

## 2020-09-13 LAB — COMPLETE METABOLIC PANEL WITH GFR
AG Ratio: 1 (calc) (ref 1.0–2.5)
ALT: 8 U/L — ABNORMAL LOW (ref 9–46)
AST: 16 U/L (ref 10–35)
Albumin: 3.8 g/dL (ref 3.6–5.1)
Alkaline phosphatase (APISO): 65 U/L (ref 35–144)
BUN/Creatinine Ratio: 19 (calc) (ref 6–22)
BUN: 27 mg/dL — ABNORMAL HIGH (ref 7–25)
CO2: 24 mmol/L (ref 20–32)
Calcium: 9.4 mg/dL (ref 8.6–10.3)
Chloride: 106 mmol/L (ref 98–110)
Creat: 1.39 mg/dL — ABNORMAL HIGH (ref 0.70–1.18)
GFR, Est African American: 55 mL/min/{1.73_m2} — ABNORMAL LOW (ref >=60)
GFR, Est Non African American: 48 mL/min/{1.73_m2} — ABNORMAL LOW (ref >=60)
Globulin: 3.9 g/dL (calc) — ABNORMAL HIGH (ref 1.9–3.7)
Glucose, Bld: 87 mg/dL (ref 65–99)
Potassium: 4.2 mmol/L (ref 3.5–5.3)
Sodium: 140 mmol/L (ref 135–146)
Total Bilirubin: 0.3 mg/dL (ref 0.2–1.2)
Total Protein: 7.7 g/dL (ref 6.1–8.1)

## 2020-09-14 ENCOUNTER — Encounter: Payer: Self-pay | Admitting: Family Medicine

## 2020-09-22 ENCOUNTER — Telehealth: Payer: Self-pay | Admitting: Nurse Practitioner

## 2020-09-22 NOTE — Telephone Encounter (Signed)
Called patient's daughter, [name], to schedule the Palliative Consult, no answer - left message with reason for call along with my name and call back number, requesting a return call.

## 2020-10-03 ENCOUNTER — Telehealth: Payer: Self-pay | Admitting: Nurse Practitioner

## 2020-10-03 NOTE — Telephone Encounter (Signed)
Called daughter to schedule visit, no answer - left message requesting a return call to let me know if they wish to pursue Palliative services or not.  Called patient's listed home number in [place] [phone]), no answer and unable to leave message due to no voicemail set up.  I have also sent patient a MyChart message requesting a call to schedule visit.

## 2020-10-06 ENCOUNTER — Telehealth: Payer: Self-pay | Admitting: Nurse Practitioner

## 2020-10-06 NOTE — Telephone Encounter (Signed)
Returned call to patient's daughter, [name] regarding the Palliative referral and she stated that she discussed this with the patient and he was in agreement with scheduling visit.  I have scheduled an In-person Consult for 10/13/20 @ 11 AM.

## 2020-10-13 ENCOUNTER — Other Ambulatory Visit: Payer: Self-pay

## 2020-10-13 ENCOUNTER — Other Ambulatory Visit: Payer: Medicare HMO | Admitting: Nurse Practitioner

## 2020-10-16 NOTE — Progress Notes (Signed)
Cardiology Office Note  Date:  10/20/2020   ID:  Terry [name], DOB 1941-03-22, MRN [id number]  PCP:  Terry Frizzle, MD   Chief Complaint  Patient presents with  . Other    3 month follow up. patient c.o fluid retension. Meds reviewed verbally with patient.     HPI:  [name] is a 79 year old gentleman with past medical history of Lower extremity edema Chronic kidney disease Morbid obesity Cellulitis Essential hypertension Incontinence on Lasix Who presents for follow-up of his congestive heart failure  Last seen by myself in clinic Sep 2021 On that visit his medication list was unclear, no family with him Was on calcium channel blockers in the setting of severe leg swelling/lymphedema.  His medications were held Seen in the hospital for prostate cancer Oct 2021  On today's visit reports he does not use Lasix very much 2-3 times a week Reports blood pressure relatively well controlled but is mildly elevated on today's visit 423 systolic at home  Not using lymph pumps.  Has room he sits in most of the day Legs continue to be markedly distended  Lab work reviewed Creatinine 1.39 BUN 27 Hemoglobin 13 Total cholesterol 112 LDL 63  presenting in a wheelchair Wife died, daughter here in town to help him  EKG personally reviewed by myself on todays visit Shows normal sinus rhythm rate 68 bpm no significant ST-T wave changes   PMH:   has a past medical history of Cancer ([place]), CHF, chronic ([place]), CKD (chronic kidney disease) stage 3, GFR 30-59 ml/min ([place]), Hypertension, Paroxysmal atrial fibrillation ([place]), and Prostate cancer ([place]).  PSH:    Past Surgical History:  Procedure Laterality Date  . COLON SURGERY N/A    Phreesia 08/04/2020    Current Outpatient Medications  Medication Sig Dispense Refill  . allopurinol (ZYLOPRIM) 300 MG tablet Take 1 tablet (300 mg total) by mouth daily. 90 tablet 0  . apixaban (ELIQUIS) 5 MG TABS tablet Take 5 mg by mouth 2  (two) times daily.    Marland Kitchen aspirin EC 81 MG tablet Take 81 mg by mouth daily.      . carvedilol (COREG) 25 MG tablet Take 25 mg by mouth 2 (two) times daily with a meal.    . fish oil-omega-3 fatty acids 1000 MG capsule Take 2 g by mouth daily.      . furosemide (LASIX) 40 MG tablet Take one tablet (40MG ) by mouth Monday, Tuesday, and Wednesdays    . ibuprofen (ADVIL,MOTRIN) 200 MG tablet Take 200 mg by mouth every 6 (six) hours as needed. FOR HEADACHES     . levothyroxine (SYNTHROID) 25 MCG tablet Take 25 mcg by mouth daily before breakfast.    . Multiple Vitamins-Minerals (MULTIVITAMINS THER. W/MINERALS) TABS Take 1 tablet by mouth daily.      . tamsulosin (FLOMAX) 0.4 MG CAPS capsule Take 0.4 mg by mouth daily.    Marland Kitchen telmisartan (MICARDIS) 40 MG tablet Take 1 tablet (40 mg total) by mouth daily. 90 tablet 0   No current facility-administered medications for this visit.     Allergies:   Lisinopril   Social History:  The patient  reports that he has never smoked. He has never used smokeless tobacco. He reports current alcohol use. He reports current drug use. Drug: Marijuana.   Family History:   family history is not on file.    Review of Systems: Review of Systems  Constitutional: Negative.   HENT: Negative.   Respiratory: Negative.  Cardiovascular: Positive for leg swelling.  Gastrointestinal: Negative.   Musculoskeletal: Negative.   Neurological: Negative.   Psychiatric/Behavioral: Negative.   All other systems reviewed and are negative.   PHYSICAL EXAM: VS:  BP (!) 150/88 (BP Location: Right Arm, Patient Position: Sitting, Cuff Size: Large)   Pulse 68   Ht 5\' 7"  (1.702 m)   Wt (!) 312 lb (141.5 kg)   BMI 48.87 kg/m  , BMI Body mass index is 48.87 kg/m. Constitutional:  oriented to person, place, and time. No distress.  HENT:  Head: Grossly normal Eyes:  no discharge. No scleral icterus.  Neck: No JVD, no carotid bruits  Cardiovascular: Regular rate and rhythm, no  murmurs appreciated 2+ edema Pulmonary/Chest: Clear to auscultation bilaterally, no wheezes or rails Abdominal: Soft.  no distension.  no tenderness.  Musculoskeletal: Normal range of motion Neurological:  normal muscle tone. Coordination normal. No atrophy Skin: Skin warm and dry Psychiatric: normal affect, pleasant   Recent Labs: 06/25/2020: TSH 3.45 09/12/2020: ALT 8; BUN 27; Creat 1.39; Hemoglobin 12.3; Platelets 240; Potassium 4.2; Sodium 140    Lipid Panel Lab Results  Component Value Date   CHOL 112 06/25/2020   HDL 35 (L) 06/25/2020   LDLCALC 63 06/25/2020   TRIG 65 06/25/2020      Wt Readings from Last 3 Encounters:  10/20/20 (!) 312 lb (141.5 kg)  09/12/20 (!) 303 lb (137.4 kg)  08/21/20 300 lb (136.1 kg)     ASSESSMENT AND PLAN:  Problem List Items Addressed This Visit      Cardiology Problems   Essential hypertension   Relevant Medications   furosemide (LASIX) 40 MG tablet   Congestive heart failure (HCC) - Primary   Relevant Medications   furosemide (LASIX) 40 MG tablet    Other Visit Diagnoses    Lower extremity edema       Morbid obesity ([place])         Lymphedema Strongly recommended that he use lymphedema compression pumps 1 hour twice daily,  Continue leg elevation, Ace wraps as needed Has not been doing the above Lasix 2 x a week.  Moderate Lasix use given underlying renal dysfunction  Chronic diastolic CHF Likely exacerbated by morbid obesity Lasix 2-3 x a week  Morbid obesity We have encouraged continued exercise, careful diet management in an effort to lose weight.  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 Still in place Would have to stop eliquis 2-3 days for stent to come out  Paroxysmal atrial fibrillation NSR today continue Eliquis, also high risk of DVT given sedentary state  History of cellulitis At risk given his lymphedema Recommend we need to work on his  swelling  Cancer? CT scan reviewed Sclerotic bone lesions in lower lumbar spine and pelvis, suspicious for bone metastases.   Total encounter time more than 25 minutes  Greater than 50% was spent in counseling and coordination of care with the patient    Signed, Esmond Plants, M.D., Ph.D. [place], [place]

## 2020-10-20 ENCOUNTER — Ambulatory Visit (INDEPENDENT_AMBULATORY_CARE_PROVIDER_SITE_OTHER): Payer: Medicare HMO | Admitting: Cardiovascular Disease

## 2020-10-20 ENCOUNTER — Other Ambulatory Visit: Payer: Self-pay

## 2020-10-20 ENCOUNTER — Encounter: Payer: Self-pay | Admitting: Cardiovascular Disease

## 2020-10-20 ENCOUNTER — Encounter: Payer: Self-pay | Admitting: Family Medicine

## 2020-10-20 ENCOUNTER — Other Ambulatory Visit: Payer: Self-pay | Admitting: Family Medicine

## 2020-10-20 VITALS — BP 150/88 | HR 68 | Ht 67.0 in | Wt 312.0 lb

## 2020-10-20 DIAGNOSIS — R600 Localized edema: Secondary | ICD-10-CM

## 2020-10-20 DIAGNOSIS — I10 Essential (primary) hypertension: Secondary | ICD-10-CM

## 2020-10-20 DIAGNOSIS — I5032 Chronic diastolic (congestive) heart failure: Secondary | ICD-10-CM | POA: Diagnosis not present

## 2020-10-20 MED ORDER — TELMISARTAN 40 MG PO TABS
40.0000 mg | ORAL_TABLET | Freq: Every day | ORAL | 0 refills | Status: DC
Start: 2020-10-20 — End: 2020-10-28

## 2020-10-20 MED ORDER — FUROSEMIDE 40 MG PO TABS: ORAL_TABLET | ORAL | 4 refills | Status: DC

## 2020-10-20 MED ORDER — CARVEDILOL 25 MG PO TABS
25.0000 mg | ORAL_TABLET | Freq: Two times a day (BID) | ORAL | 0 refills | Status: DC
Start: 2020-10-20 — End: 2021-05-07

## 2020-10-20 MED ORDER — APIXABAN 5 MG PO TABS: 5.0000 mg | ORAL_TABLET | Freq: Two times a day (BID) | ORAL | 3 refills | Status: DC

## 2020-10-20 MED ORDER — ALLOPURINOL 300 MG PO TABS
300.0000 mg | ORAL_TABLET | Freq: Every day | ORAL | 0 refills | Status: DC
Start: 2020-10-20 — End: 2020-10-22

## 2020-10-20 NOTE — Patient Instructions (Addendum)
Medication Instructions:  No changes Cardiac medications have been refilled and sent to pharmacy   If you need a refill on your cardiac medications before your next appointment, please call your pharmacy.    Lab work: No new labs needed   If you have labs (blood work) drawn today and your tests are completely normal, you will receive your results only by:  [place] (if you have MyChart) OR  A paper copy in the mail If you have any lab test that is abnormal or we need to change your treatment, we will call you to review the results.   Testing/Procedures: No new testing needed   Follow-Up: At [facility], you and your health needs are our priority.  As part of our continuing mission to provide you with exceptional heart care, we have created designated Provider Care Teams.  These Care Teams include your primary Cardiologist (physician) and Advanced Practice Providers (APPs -  Physician Assistants and Nurse Practitioners) who all work together to provide you with the care you need, when you need it.   You will need a follow up appointment in 6 months   Providers on your designated Care Team:    Murray Hodgkins, NP  Christell Faith, PA-C  Marrianne Mood, PA-C  Any Other Special Instructions Will Be Listed Below (If Applicable).  COVID-19 Vaccine Information can be found at: ShippingScam.co.uk For questions related to vaccine distribution or appointments, please email [email or web address] or call [phone].

## 2020-10-20 NOTE — Telephone Encounter (Signed)
Routine medications filled.   Tramadol is not on current list.  Please advise.

## 2020-10-20 NOTE — Telephone Encounter (Signed)
Medication in not on current list.   MD please advise.

## 2020-10-21 ENCOUNTER — Other Ambulatory Visit: Payer: Medicare HMO | Admitting: Nurse Practitioner

## 2020-10-21 ENCOUNTER — Encounter: Payer: Self-pay | Admitting: Nurse Practitioner

## 2020-10-21 ENCOUNTER — Encounter: Payer: Self-pay | Admitting: Family Medicine

## 2020-10-21 DIAGNOSIS — Z515 Encounter for palliative care: Secondary | ICD-10-CM

## 2020-10-21 DIAGNOSIS — I509 Heart failure, unspecified: Secondary | ICD-10-CM

## 2020-10-21 NOTE — Progress Notes (Signed)
[employer] Palliative Care Consult Note Telephone: [phone]  Fax: [phone]  PATIENT NAME: Terry [name] DOB: September 12, 1941 MRN: [id number]  PRIMARY CARE PROVIDER:   Susy Frizzle, MD  REFERRING PROVIDER:  Susy Frizzle, MD [address] [facility] [place],  [place] [address]  RESPONSIBLE PARTY:   Self; Daughter [name] [phone]  I spent 90 minutes providing this consultation,  from 10:00amto 11:30am. More than 50% of the time in this consultation was spent coordinating communication.   HISTORY OF PRESENT ILLNESS:  Terry [name] is a 79 y.o. year old male with multiple medical problems including Malignant neoplasm of transverse colon, Prostate cancer, Phreesia 9 / 20 / 2021, proximal atrial fibrillation, diastolic congestive heart failure, chronic kidney disease, hypertension oh, iron deficiency anemia, morbid obesity, depression with anxiety.     Seen by Cardiology 12 / 6 / 2021 recommend to use lymphedema compression wraps, Lasix twice a week due to underlying renal dysfunction, chronic diastolic congestive heart failure likely exacerbated by morbid obesity. Severe right hydronephrosis with your little stint in would stop Eliquis 2 to 3 days for sent to come out. Eliquis for proximal atrial fibrillation. 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 Sclerotic bone lesion in lumbar lower spine and pelvis suspicious for bone metastasis reviewed on CT scan. In-person initial palliative care visit with Terry [name] and daughter Terry [name] in Terry. [place]'s home. We talked about purpose of Palliative care visit. Terry [name] in agreement. We talked about how he was feeling. Terry [name] endorses he is doing okay one day at a time. We talked about his functional ability says he does walk with a walker. Terry. [place] does have a shower chair and require some assistance with ADLs. Terry Fox does feed himself an appetite is fair despite weight gain, Weight 312 with BMI 48. Terry  [name] continues to have lymphedema and bilateral lower extremities and fluctuations with his weight. Terry [name] does have challenges with fluid and is taking Lasix two to three times a week. We talked about Life review. We talked about Terry [name] being widowed of 6 years having five children moving from [place] to [place] to where he currently resides. We talked about the challenges that he faces functionally. We talked about recent therapy which he is now complete did. We talked about home health nursing but just now also completed. We talked about past medical history including cancer diagnosis, congestive heart failure, disease progression. We talked about different treatments he has undergone continuing with hormone therapy for his cancer. We talked about lymphedema at like. We talked about the option of the lymphedema clinic. We talked about the challenges Terry [name] has getting to appointments with limited mobility. We talked about congestive heart failure progression. We talked about the option of [facility] Paramedic program based out of the congestive heart failure clinic. Will follow up with Dr Rockey Situ his cardiologist to see if that would be beneficial. Terry [name] and I talked at length about the [employer] and that is what she has been looking for someone to check on Terry [name] often with assistance in regulating medications and collaborating with Cardiology, Heart Failure Clinic with weights and edema. We talked about symptoms of pain which he does experience in his knees bilateral. Terry [name] endorses that previously years ago he did have injections in his knees for osteoarthritis for which he had to pay for out of pocket. Terry [name] endorses it did help him  with the pain and increase in mobility. We talked about option of having a follow-up Orthopedic appointment for evaluation for possible injections bilateral knees. Terry [name] and Terry [name] both in agreement that may  be beneficial improving quality of life. We talked about currently prescribed Tramadol for pain. Terry Coller endorses he takes the Tramadol maybe once or twice a week with some relief. Terry [name] endorses that he does get more relief from [facility] [employer]. We talked about the caution and using [employer] in the setting of Eliquis. We talked about alternative pain modalities. We talked about appetite nutrition. We talked about medical goals of care and cleaning aggressive versus conservative versus comfort care. We talked about wishes for aggressive interventions including CPR, intubation, full scope of treatment at present time. Introduce most form and will leave blank copy for review. We talked about quality-of-life and what that means to Terry [name]. Terry [name] talked at length about his spiritual beliefs and how he is "led by God's presence". Terry Molli [name] talked at length about his beliefs, support system he has in place, as well as is trusting in God for healing. We talked about role of Palliative care and plan of care. Discuss that will follow up in 4 weeks for ongoing monitoring of chronic disease progression of CHF and metastatic cancer. We talked about will follow up with primary provider for Lymphedema Clinic appointment, orthopedic and Terry [name] requested podiatry in addition to Dr Rockey Situ Cardiology for [facility] Paramedic program. Terry [name] and Terry. Thomas in agreement. Therapeutical listening, emotional support provided. Contact information. Questions answered to satisfaction. Appointment scheduled for follow up.  Palliative Care was asked to help address goals of care.   CODE STATUS: Full   PPS: 50% HOSPICE ELIGIBILITY/DIAGNOSIS: TBD  PAST MEDICAL HISTORY:  Past Medical History:  Diagnosis Date  . Cancer ([place])    Phreesia 08/04/2020  . CHF, chronic (HCC)    diastolic  . CKD (chronic kidney disease) stage 3, GFR 30-59 ml/min (HCC)   . Hypertension   . Paroxysmal atrial fibrillation  (HCC)   . Prostate cancer [facility])     SOCIAL HX:  Social History   Tobacco Use  . Smoking status: Never Smoker  . Smokeless tobacco: Never Used  Substance Use Topics  . Alcohol use: Yes    Comment: rarely    ALLERGIES:  Allergies  Allergen Reactions  . Lisinopril     FACIAL SWELLING     PERTINENT MEDICATIONS:  Outpatient Encounter Medications as of 10/21/2020  Medication Sig  . allopurinol (ZYLOPRIM) 300 MG tablet Take 1 tablet (300 mg total) by mouth daily.  Marland Kitchen apixaban (ELIQUIS) 5 MG TABS tablet Take 1 tablet (5 mg total) by mouth 2 (two) times daily.  Marland Kitchen aspirin EC 81 MG tablet Take 81 mg by mouth daily.    . carvedilol (COREG) 25 MG tablet Take 1 tablet (25 mg total) by mouth 2 (two) times daily with a meal.  . fish oil-omega-3 fatty acids 1000 MG capsule Take 2 g by mouth daily.    . furosemide (LASIX) 40 MG tablet Take one tablet (40MG ) by mouth Monday, Tuesday, and Wednesdays  . ibuprofen (ADVIL,MOTRIN) 200 MG tablet Take 200 mg by mouth every 6 (six) hours as needed. FOR HEADACHES   . levothyroxine (SYNTHROID) 25 MCG tablet Take 25 mcg by mouth daily before breakfast.  . Multiple Vitamins-Minerals (MULTIVITAMINS THER. W/MINERALS) TABS Take 1 tablet by mouth daily.    . tamsulosin (FLOMAX) 0.4 MG  CAPS capsule Take 0.4 mg by mouth daily.  Marland Kitchen telmisartan (MICARDIS) 40 MG tablet Take 1 tablet (40 mg total) by mouth daily.  . traMADol (ULTRAM) 50 MG tablet Take 1 tablet by mouth twice daily as needed for pain   No facility-administered encounter medications on file as of 10/21/2020.    PHYSICAL EXAM:   General: NAD, chronically ill, generalized weakness, pleasant male Cardiovascular: regular rate and rhythm Pulmonary: clear ant fields Abdomen: soft, nontender, + bowel sounds Extremities: 4+BLE edema, no joint deformities Neurological: walks with walker; generalized weakness  Barry Culverhouse Ihor Gully, NP

## 2020-10-22 ENCOUNTER — Other Ambulatory Visit: Payer: Self-pay

## 2020-10-22 MED ORDER — ALLOPURINOL 300 MG PO TABS
300.0000 mg | ORAL_TABLET | Freq: Every day | ORAL | 3 refills | Status: DC
Start: 2020-10-22 — End: 2021-05-13

## 2020-10-23 ENCOUNTER — Other Ambulatory Visit: Payer: Self-pay | Admitting: Family Medicine

## 2020-10-23 ENCOUNTER — Inpatient Hospital Stay (HOSPITAL_COMMUNITY)
Admission: EM | Admit: 2020-10-23 | Discharge: 2020-10-28 | DRG: 871 | Disposition: A | Discharge status: Home Health | Payer: Medicare HMO | Attending: Internal Medicine | Admitting: Internal Medicine

## 2020-10-23 ENCOUNTER — Emergency Department (HOSPITAL_COMMUNITY): Payer: Medicare HMO

## 2020-10-23 DIAGNOSIS — Z8546 Personal history of malignant neoplasm of prostate: Secondary | ICD-10-CM

## 2020-10-23 DIAGNOSIS — Z79890 Hormone replacement therapy (postmenopausal): Secondary | ICD-10-CM

## 2020-10-23 DIAGNOSIS — R0602 Shortness of breath: Secondary | ICD-10-CM

## 2020-10-23 DIAGNOSIS — I130 Hypertensive heart and chronic kidney disease with heart failure and stage 1 through stage 4 chronic kidney disease, or unspecified chronic kidney disease: Secondary | ICD-10-CM | POA: Diagnosis present

## 2020-10-23 DIAGNOSIS — Z23 Encounter for immunization: Secondary | ICD-10-CM

## 2020-10-23 DIAGNOSIS — Z79899 Other long term (current) drug therapy: Secondary | ICD-10-CM

## 2020-10-23 DIAGNOSIS — N1832 Chronic kidney disease, stage 3b: Secondary | ICD-10-CM | POA: Diagnosis present

## 2020-10-23 DIAGNOSIS — N400 Enlarged prostate without lower urinary tract symptoms: Secondary | ICD-10-CM | POA: Diagnosis present

## 2020-10-23 DIAGNOSIS — I472 Ventricular tachycardia: Secondary | ICD-10-CM | POA: Diagnosis not present

## 2020-10-23 DIAGNOSIS — C799 Secondary malignant neoplasm of unspecified site: Secondary | ICD-10-CM | POA: Diagnosis present

## 2020-10-23 DIAGNOSIS — A419 Sepsis, unspecified organism: Secondary | ICD-10-CM | POA: Diagnosis not present

## 2020-10-23 DIAGNOSIS — E039 Hypothyroidism, unspecified: Secondary | ICD-10-CM | POA: Diagnosis present

## 2020-10-23 DIAGNOSIS — M109 Gout, unspecified: Secondary | ICD-10-CM | POA: Diagnosis present

## 2020-10-23 DIAGNOSIS — N390 Urinary tract infection, site not specified: Secondary | ICD-10-CM

## 2020-10-23 DIAGNOSIS — I5033 Acute on chronic diastolic (congestive) heart failure: Secondary | ICD-10-CM | POA: Diagnosis present

## 2020-10-23 DIAGNOSIS — N136 Pyonephrosis: Secondary | ICD-10-CM | POA: Diagnosis present

## 2020-10-23 DIAGNOSIS — L608 Other nail disorders: Secondary | ICD-10-CM

## 2020-10-23 DIAGNOSIS — N183 Chronic kidney disease, stage 3 unspecified: Secondary | ICD-10-CM | POA: Diagnosis present

## 2020-10-23 DIAGNOSIS — Z85038 Personal history of other malignant neoplasm of large intestine: Secondary | ICD-10-CM

## 2020-10-23 DIAGNOSIS — Z7982 Long term (current) use of aspirin: Secondary | ICD-10-CM

## 2020-10-23 DIAGNOSIS — I5032 Chronic diastolic (congestive) heart failure: Secondary | ICD-10-CM

## 2020-10-23 DIAGNOSIS — Z6841 Body Mass Index (BMI) 40.0 and over, adult: Secondary | ICD-10-CM

## 2020-10-23 DIAGNOSIS — Z888 Allergy status to other drugs, medicaments and biological substances status: Secondary | ICD-10-CM

## 2020-10-23 DIAGNOSIS — E1122 Type 2 diabetes mellitus with diabetic chronic kidney disease: Secondary | ICD-10-CM | POA: Diagnosis present

## 2020-10-23 DIAGNOSIS — C61 Malignant neoplasm of prostate: Secondary | ICD-10-CM | POA: Diagnosis present

## 2020-10-23 DIAGNOSIS — Z20822 Contact with and (suspected) exposure to covid-19: Secondary | ICD-10-CM | POA: Diagnosis present

## 2020-10-23 DIAGNOSIS — K5900 Constipation, unspecified: Secondary | ICD-10-CM | POA: Diagnosis not present

## 2020-10-23 DIAGNOSIS — I10 Essential (primary) hypertension: Secondary | ICD-10-CM | POA: Diagnosis present

## 2020-10-23 DIAGNOSIS — R32 Unspecified urinary incontinence: Secondary | ICD-10-CM | POA: Diagnosis present

## 2020-10-23 DIAGNOSIS — M1990 Unspecified osteoarthritis, unspecified site: Secondary | ICD-10-CM | POA: Diagnosis present

## 2020-10-23 DIAGNOSIS — N179 Acute kidney failure, unspecified: Secondary | ICD-10-CM | POA: Diagnosis not present

## 2020-10-23 DIAGNOSIS — I480 Paroxysmal atrial fibrillation: Secondary | ICD-10-CM | POA: Diagnosis present

## 2020-10-23 DIAGNOSIS — Z7901 Long term (current) use of anticoagulants: Secondary | ICD-10-CM

## 2020-10-23 HISTORY — DX: Unspecified osteoarthritis, unspecified site: M19.90

## 2020-10-23 HISTORY — DX: Hypothyroidism, unspecified: E03.9

## 2020-10-23 HISTORY — DX: Cardiac arrhythmia, unspecified: I49.9

## 2020-10-23 HISTORY — DX: Type 2 diabetes mellitus without complications: E11.9

## 2020-10-23 LAB — COMPREHENSIVE METABOLIC PANEL
ALT: 14 U/L (ref 0–44)
AST: 22 U/L (ref 15–41)
Albumin: 3.1 g/dL — ABNORMAL LOW (ref 3.5–5.0)
Alkaline Phosphatase: 53 U/L (ref 38–126)
Anion gap: 11 (ref 5–15)
BUN: 20 mg/dL (ref 8–23)
CO2: 24 mmol/L (ref 22–32)
Calcium: 9.1 mg/dL (ref 8.9–10.3)
Chloride: 105 mmol/L (ref 98–111)
Creatinine, Ser: 1.7 mg/dL — ABNORMAL HIGH (ref 0.61–1.24)
GFR, Estimated: 41 mL/min — ABNORMAL LOW (ref >60)
Glucose, Bld: 92 mg/dL (ref 70–99)
Potassium: 3.7 mmol/L (ref 3.5–5.1)
Sodium: 140 mmol/L (ref 135–145)
Total Bilirubin: 0.8 mg/dL (ref 0.3–1.2)
Total Protein: 7.3 g/dL (ref 6.5–8.1)

## 2020-10-23 LAB — CBC WITH DIFFERENTIAL/PLATELET
Abs Immature Granulocytes: 0.1 10*3/uL — ABNORMAL HIGH (ref 0.00–0.07)
Basophils Absolute: 0 10*3/uL (ref 0.0–0.1)
Basophils Relative: 0 %
Eosinophils Absolute: 0 10*3/uL (ref 0.0–0.5)
Eosinophils Relative: 0 %
HCT: 38.5 % — ABNORMAL LOW (ref 39.0–52.0)
Hemoglobin: 11.9 g/dL — ABNORMAL LOW (ref 13.0–17.0)
Immature Granulocytes: 1 %
Lymphocytes Relative: 2 %
Lymphs Abs: 0.3 10*3/uL — ABNORMAL LOW (ref 0.7–4.0)
MCH: 24.1 pg — ABNORMAL LOW (ref 26.0–34.0)
MCHC: 30.9 g/dL (ref 30.0–36.0)
MCV: 78.1 fL — ABNORMAL LOW (ref 80.0–100.0)
Monocytes Absolute: 0.7 10*3/uL (ref 0.1–1.0)
Monocytes Relative: 5 %
Neutro Abs: 13.7 10*3/uL — ABNORMAL HIGH (ref 1.7–7.7)
Neutrophils Relative %: 92 %
Platelets: 221 10*3/uL (ref 150–400)
RBC: 4.93 MIL/uL (ref 4.22–5.81)
RDW: 18.9 % — ABNORMAL HIGH (ref 11.5–15.5)
WBC: 14.9 10*3/uL — ABNORMAL HIGH (ref 4.0–10.5)
nRBC: 0 % (ref 0.0–0.2)

## 2020-10-23 LAB — PROTIME-INR
INR: 1.3 — ABNORMAL HIGH (ref 0.8–1.2)
Prothrombin Time: 15.4 seconds — ABNORMAL HIGH (ref 11.4–15.2)

## 2020-10-23 LAB — APTT: aPTT: 27 seconds (ref 24–36)

## 2020-10-23 MED ORDER — LACTATED RINGERS IV SOLN: INTRAVENOUS | Status: DC

## 2020-10-23 MED ORDER — METRONIDAZOLE 500 MG PO TABS
500.0000 mg | ORAL_TABLET | Freq: Three times a day (TID) | ORAL | Status: DC
  Administered 2020-10-23 – 2020-10-24 (×2): 500 mg via ORAL
  Filled 2020-10-23 (×2): qty 1

## 2020-10-23 MED ORDER — VANCOMYCIN HCL 10 G IV SOLR
2500.0000 mg | Freq: Once | INTRAVENOUS | Status: AC
  Administered 2020-10-23: 2500 mg via INTRAVENOUS
  Filled 2020-10-23: qty 2500

## 2020-10-23 MED ORDER — ACETAMINOPHEN 500 MG PO TABS
1000.0000 mg | ORAL_TABLET | Freq: Once | ORAL | Status: AC
  Administered 2020-10-23: 1000 mg via ORAL
  Filled 2020-10-23: qty 2

## 2020-10-23 MED ORDER — VANCOMYCIN HCL IN DEXTROSE 1-5 GM/200ML-% IV SOLN
1000.0000 mg | Freq: Once | INTRAVENOUS | Status: DC
Start: 2020-10-23 — End: 2020-10-23

## 2020-10-23 MED ORDER — SODIUM CHLORIDE 0.9 % IV SOLN
2.0000 g | Freq: Once | INTRAVENOUS | Status: AC
  Administered 2020-10-23: 2 g via INTRAVENOUS
  Filled 2020-10-23: qty 2

## 2020-10-23 NOTE — Progress Notes (Incomplete)
Pharmacy Antibiotic Note  Terry [name] is a 79 y.o. male admitted on 10/23/2020 with generalized weakness and fevers of unknown origin concerning for an infection.  Pharmacy has been consulted for Cefepime and vancomycin dosing. Labs pending   Plan:   Height: 5\' 7"  (170.2 cm) Weight: (!) 140.6 kg (310 lb) IBW/kg (Calculated) : 66.1  Temp (24hrs), Avg:101.2 F (38.4 C), Min:101.2 F (38.4 C), Max:101.2 F (38.4 C)  No results for input(s): WBC, CREATININE, LATICACIDVEN, VANCOTROUGH, VANCOPEAK, VANCORANDOM, GENTTROUGH, GENTPEAK, GENTRANDOM, TOBRATROUGH, TOBRAPEAK, TOBRARND, AMIKACINPEAK, AMIKACINTROU, AMIKACIN in the last 168 hours.  CrCl cannot be calculated (Patient's most recent lab result is older than the maximum 21 days allowed.).    Allergies  Allergen Reactions  . Lisinopril     FACIAL SWELLING    Antimicrobials this admission: Cefepime 12/9 >>  Vanc 12/9 >>   Dose adjustments this admission:  Microbiology results: 12/9 BCx:  12/9 UCx:     Thank you for allowing pharmacy to be a part of this patient's care.  Albertina Parr, PharmD., BCPS, BCCCP Clinical Pharmacist Please refer to [facility] for unit-specific pharmacist

## 2020-10-23 NOTE — Progress Notes (Signed)
Following for Code Sepsis  

## 2020-10-23 NOTE — ED Notes (Signed)
Please contact [name] (daughter) if you have any questions at [phone]

## 2020-10-23 NOTE — ED Provider Notes (Signed)
[facility] EMERGENCY DEPARTMENT Provider Note   CSN: [id number] Arrival date & time: 10/23/20  2150     History Chief Complaint  Patient presents with  . Weakness    Generalized, possible UTI, fever    Terry [name] is a 79 y.o. male.  Patient presents to the emergency department with a chief complaint of generalized weakness. He is also reportedly been running a fever and has had some urinary incontinence. Family is suspicious of UTI. Patient also has history of colon and prostate cancer.  He is full code per recent palliative note.  He Denies any cough.  Denies abdominal pain.  Denies and specific symptoms, but states that he just doesn't feel well.  The history is provided by the patient. No language interpreter was used.       Past Medical History:  Diagnosis Date  . Cancer ([place])    Phreesia 08/04/2020  . CHF, chronic (HCC)    diastolic  . CKD (chronic kidney disease) stage 3, GFR 30-59 ml/min (HCC)   . Hypertension   . Paroxysmal atrial fibrillation (HCC)   . Prostate cancer [facility])     Patient Active Problem List   Diagnosis Date Noted  . CHF, chronic ([place])   . Prostate cancer ([place])   . Congestive heart failure ([place]) 07/02/2020  . Macrocytic anemia 06/30/2020  . Elevated PSA 06/30/2020  . Cellulitis of lower extremity 06/30/2020  . Prediabetes 01/04/2019  . Paroxysmal atrial fibrillation ([place]) 01/01/2019  . Risk for falls 12/27/2017  . 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[place]) 10/18/2017  . Right ureteral stone 10/01/2017  . Iron deficiency anemia due to chronic blood loss 06/22/2016  . 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  . 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  . Depression with anxiety 06/19/2015  . CKD (chronic kidney disease) stage 3, GFR 30-59 ml/min (HCC) 05/26/2015  . Essential hypertension 11/01/2014  . Gouty arthritis of toe 11/01/2014  . Morbid obesity due to excess calories ([place]) 11/01/2014  . Primary osteoarthritis of both knees  11/01/2014    Past Surgical History:  Procedure Laterality Date  . COLON SURGERY N/A    Phreesia 08/04/2020       No family history on file.  Social History   Tobacco Use  . Smoking status: Never Smoker  . Smokeless tobacco: Never Used  Substance Use Topics  . Alcohol use: Yes    Comment: rarely  . Drug use: Yes    Types: Marijuana    Home Medications Prior to Admission medications   Medication Sig Start Date End Date Taking? Authorizing Provider  allopurinol (ZYLOPRIM) 300 MG tablet Take 1 tablet (300 mg total) by mouth daily. 10/22/20   Susy Frizzle, MD  apixaban (ELIQUIS) 5 MG TABS tablet Take 1 tablet (5 mg total) by mouth 2 (two) times daily. 10/20/20   Minna Merritts, MD  aspirin EC 81 MG tablet Take 81 mg by mouth daily.      [provider]  carvedilol (COREG) 25 MG tablet Take 1 tablet (25 mg total) by mouth 2 (two) times daily with a meal. 10/20/20   Gollan, Kathlene November, MD  fish oil-omega-3 fatty acids 1000 MG capsule Take 2 g by mouth daily.      [provider]  furosemide (LASIX) 40 MG tablet Take one tablet (40MG ) by mouth Monday, Tuesday, and Wednesdays 10/20/20   Susy Frizzle, MD  ibuprofen (ADVIL,MOTRIN) 200 MG tablet Take 200 mg by mouth every 6 (six) hours as needed.  FOR HEADACHES     [provider]  levothyroxine (SYNTHROID) 25 MCG tablet Take 25 mcg by mouth daily before breakfast.    [provider]  Multiple Vitamins-Minerals (MULTIVITAMINS THER. W/MINERALS) TABS Take 1 tablet by mouth daily.      [provider]  tamsulosin (FLOMAX) 0.4 MG CAPS capsule Take 0.4 mg by mouth daily. 06/17/20   [provider]  telmisartan (MICARDIS) 40 MG tablet Take 1 tablet (40 mg total) by mouth daily. 10/20/20   Minna Merritts, MD  traMADol Veatrice Bourbon) 50 MG tablet Take 1 tablet by mouth twice daily as needed for pain 10/21/20   Susy Frizzle, MD    Allergies    Lisinopril  Review of Systems   Review  of Systems  All other systems reviewed and are negative.   Physical Exam Updated Vital Signs BP (!) 170/78 (BP Location: Right Arm)   Pulse (!) 110   Temp (!) 101.2 F (38.4 C) (Oral)   Resp (!) 24   Ht 5\' 7"  (1.702 m)   Wt (!) 140.6 kg   SpO2 95%   BMI 48.55 kg/m   Physical Exam Vitals and nursing note reviewed.  Constitutional:      Appearance: He is well-developed and well-nourished.  HENT:     Head: Normocephalic and atraumatic.  Eyes:     Conjunctiva/sclera: Conjunctivae normal.  Cardiovascular:     Rate and Rhythm: Normal rate and regular rhythm.     Heart sounds: No murmur heard.   Pulmonary:     Effort: Pulmonary effort is normal. No respiratory distress.     Breath sounds: Normal breath sounds.  Abdominal:     Palpations: Abdomen is soft.     Tenderness: There is no abdominal tenderness.  Musculoskeletal:        General: No edema. Normal range of motion.     Cervical back: Neck supple.  Skin:    General: Skin is warm and dry.  Neurological:     Mental Status: He is alert.  Psychiatric:        Mood and Affect: Mood and affect and mood normal.        Behavior: Behavior normal.     ED Results / Procedures / Treatments   Labs (all labs ordered are listed, but only abnormal results are displayed) Labs Reviewed  CULTURE, BLOOD (ROUTINE X 2)  CULTURE, BLOOD (ROUTINE X 2)  URINE CULTURE  RESP PANEL BY RT-PCR (FLU A&B, COVID) ARPGX2  LACTIC ACID, PLASMA  LACTIC ACID, PLASMA  COMPREHENSIVE METABOLIC PANEL  CBC WITH DIFFERENTIAL/PLATELET  PROTIME-INR  APTT  URINALYSIS, ROUTINE W REFLEX MICROSCOPIC    EKG None  Radiology No results found.  Procedures .Critical Care Performed by: Montine Circle, PA-C Authorized by: Montine Circle, PA-C   Critical care provider statement:    Critical care time (minutes):  55   Critical care was necessary to treat or prevent imminent or life-threatening deterioration of the following conditions:  Sepsis    Critical care was time spent personally by me on the following activities:  Discussions with consultants, evaluation of patient's response to treatment, examination of patient, ordering and performing treatments and interventions, ordering and review of laboratory studies, ordering and review of radiographic studies, pulse oximetry, re-evaluation of patient's condition, obtaining history from patient or surrogate and review of old charts   (including critical care time)  Medications Ordered in ED Medications  lactated ringers infusion (has no administration in time range)  ceFEPIme (  MAXIPIME) 2 g in sodium chloride 0.9 % 100 mL IVPB (has no administration in time range)  metroNIDAZOLE (FLAGYL) tablet 500 mg (has no administration in time range)  vancomycin (VANCOCIN) 2,500 mg in sodium chloride 0.9 % 500 mL IVPB (has no administration in time range)    ED Course  I have reviewed the triage vital signs and the nursing notes.  Pertinent labs & imaging results that were available during my care of the patient were reviewed by me and considered in my medical decision making (see chart for details).    MDM Rules/Calculators/A&P                          This patient complains of fever and body aches, this involves an extensive number of treatment options, and is a complaint that carries with it a high risk of complications and morbidity.    Differential Dx COVID, pneumonia, UTI, sepsis  Pertinent Labs I ordered, reviewed, and interpreted labs, which included CBC, BMP, lactic, UA notable for UA consistent with infection, lactic 2.0->1.8, WBC 14.9, Cr 1.7  Imaging Interpretation I ordered imaging studies which included CT abd and CXR, which showed no clear source of infection.   Medications I ordered medication fluid and abx for sepsis.   Critical Interventions  Multiple consults, iv meds, fluids  Reassessments After the interventions stated above, I reevaluated the patient and found  him alert and oriented.  Remains stable.  Consultants Dr. Tonie Griffith - [facility], who is appreciated for admitting. Dr. Al Pimple - Urology, who states likely no immediate intervention for ureteral stent.  Will give recs.  Plan Admit - Sepsis 2/2 UTI    Final Clinical Impression(s) / ED Diagnoses Final diagnoses:  Sepsis, due to unspecified organism, unspecified whether acute organ dysfunction present [facility])  Urinary tract infection without hematuria, site unspecified    Rx / DC Orders ED Discharge Orders    None       Montine Circle, PA-C 10/24/20 0536    Sherwood Gambler, MD 10/24/20 1709

## 2020-10-23 NOTE — ED Triage Notes (Signed)
Patient presents from home c/o generalized weakness. Urinary incontinence. Fever. Has not taken meds for same. Family reports possible UTI. Patient denies pain. Vitals stable.

## 2020-10-23 NOTE — ED Provider Notes (Signed)
Medical screening examination/treatment/procedure(s) were conducted as a shared visit with non-physician practitioner(s) and myself.  I personally evaluated the patient during the encounter.      Patient presents with fever and generalized weakness.  Possible urinary source with some recent urinary incontinence.  Otherwise he is not hypotensive but meets SIRS criteria and thus will be given broad IV antibiotics and fluids.   Angiocath insertion Performed by: Ephraim Hamburger  Consent: Verbal consent obtained. Risks and benefits: risks, benefits and alternatives were discussed Time out: Immediately prior to procedure a "time out" was called to verify the correct patient, procedure, equipment, support staff and site/side marked as required.  Preparation: Patient was prepped and draped in the usual sterile fashion.  Vein Location: left basilic  Ultrasound Guided  Gauge: 20  Normal blood return and flush without difficulty Patient tolerance: Patient tolerated the procedure well with no immediate complications.      Sherwood Gambler, MD 10/23/20 2300

## 2020-10-23 NOTE — ED Notes (Signed)
Pt daughter [name] would like to have a callback with an update for her father, callback number [phone].

## 2020-10-24 ENCOUNTER — Encounter (HOSPITAL_COMMUNITY): Payer: Self-pay | Admitting: Family Medicine

## 2020-10-24 ENCOUNTER — Inpatient Hospital Stay (HOSPITAL_COMMUNITY): Payer: Medicare HMO

## 2020-10-24 ENCOUNTER — Emergency Department (HOSPITAL_COMMUNITY): Payer: Medicare HMO

## 2020-10-24 ENCOUNTER — Other Ambulatory Visit: Payer: Self-pay

## 2020-10-24 DIAGNOSIS — I130 Hypertensive heart and chronic kidney disease with heart failure and stage 1 through stage 4 chronic kidney disease, or unspecified chronic kidney disease: Secondary | ICD-10-CM | POA: Diagnosis present

## 2020-10-24 DIAGNOSIS — I480 Paroxysmal atrial fibrillation: Secondary | ICD-10-CM | POA: Diagnosis present

## 2020-10-24 DIAGNOSIS — Z23 Encounter for immunization: Secondary | ICD-10-CM | POA: Diagnosis present

## 2020-10-24 DIAGNOSIS — N400 Enlarged prostate without lower urinary tract symptoms: Secondary | ICD-10-CM | POA: Diagnosis present

## 2020-10-24 DIAGNOSIS — A419 Sepsis, unspecified organism: Secondary | ICD-10-CM | POA: Diagnosis present

## 2020-10-24 DIAGNOSIS — C799 Secondary malignant neoplasm of unspecified site: Secondary | ICD-10-CM | POA: Diagnosis present

## 2020-10-24 DIAGNOSIS — I5033 Acute on chronic diastolic (congestive) heart failure: Secondary | ICD-10-CM | POA: Diagnosis present

## 2020-10-24 DIAGNOSIS — Z8546 Personal history of malignant neoplasm of prostate: Secondary | ICD-10-CM | POA: Diagnosis not present

## 2020-10-24 DIAGNOSIS — N179 Acute kidney failure, unspecified: Secondary | ICD-10-CM | POA: Diagnosis not present

## 2020-10-24 DIAGNOSIS — Z6841 Body Mass Index (BMI) 40.0 and over, adult: Secondary | ICD-10-CM | POA: Diagnosis not present

## 2020-10-24 DIAGNOSIS — I5032 Chronic diastolic (congestive) heart failure: Secondary | ICD-10-CM

## 2020-10-24 DIAGNOSIS — M109 Gout, unspecified: Secondary | ICD-10-CM | POA: Diagnosis present

## 2020-10-24 DIAGNOSIS — R0602 Shortness of breath: Secondary | ICD-10-CM | POA: Diagnosis present

## 2020-10-24 DIAGNOSIS — Z20822 Contact with and (suspected) exposure to covid-19: Secondary | ICD-10-CM | POA: Diagnosis present

## 2020-10-24 DIAGNOSIS — C61 Malignant neoplasm of prostate: Secondary | ICD-10-CM | POA: Diagnosis present

## 2020-10-24 DIAGNOSIS — E039 Hypothyroidism, unspecified: Secondary | ICD-10-CM | POA: Diagnosis present

## 2020-10-24 DIAGNOSIS — I472 Ventricular tachycardia: Secondary | ICD-10-CM | POA: Diagnosis not present

## 2020-10-24 DIAGNOSIS — I10 Essential (primary) hypertension: Secondary | ICD-10-CM | POA: Diagnosis not present

## 2020-10-24 DIAGNOSIS — N1832 Chronic kidney disease, stage 3b: Secondary | ICD-10-CM | POA: Diagnosis present

## 2020-10-24 DIAGNOSIS — I5031 Acute diastolic (congestive) heart failure: Secondary | ICD-10-CM | POA: Diagnosis not present

## 2020-10-24 DIAGNOSIS — Z79899 Other long term (current) drug therapy: Secondary | ICD-10-CM | POA: Diagnosis not present

## 2020-10-24 DIAGNOSIS — R32 Unspecified urinary incontinence: Secondary | ICD-10-CM | POA: Diagnosis present

## 2020-10-24 DIAGNOSIS — M1990 Unspecified osteoarthritis, unspecified site: Secondary | ICD-10-CM | POA: Diagnosis present

## 2020-10-24 DIAGNOSIS — I4819 Other persistent atrial fibrillation: Secondary | ICD-10-CM | POA: Diagnosis not present

## 2020-10-24 DIAGNOSIS — Z7901 Long term (current) use of anticoagulants: Secondary | ICD-10-CM | POA: Diagnosis not present

## 2020-10-24 DIAGNOSIS — N136 Pyonephrosis: Secondary | ICD-10-CM | POA: Diagnosis present

## 2020-10-24 DIAGNOSIS — N390 Urinary tract infection, site not specified: Secondary | ICD-10-CM

## 2020-10-24 DIAGNOSIS — K5900 Constipation, unspecified: Secondary | ICD-10-CM | POA: Diagnosis not present

## 2020-10-24 DIAGNOSIS — E1122 Type 2 diabetes mellitus with diabetic chronic kidney disease: Secondary | ICD-10-CM | POA: Diagnosis present

## 2020-10-24 LAB — URINALYSIS, ROUTINE W REFLEX MICROSCOPIC
Bilirubin Urine: NEGATIVE
Glucose, UA: NEGATIVE mg/dL
Ketones, ur: NEGATIVE mg/dL
Nitrite: NEGATIVE
Protein, ur: 100 mg/dL — AB
RBC / HPF: >50 RBC/hpf — ABNORMAL HIGH (ref 0–5)
Specific Gravity, Urine: 1.015 (ref 1.005–1.030)
WBC, UA: >50 WBC/hpf — ABNORMAL HIGH (ref 0–5)
pH: 6 (ref 5.0–8.0)

## 2020-10-24 LAB — COMPREHENSIVE METABOLIC PANEL
ALT: 13 U/L (ref 0–44)
AST: 27 U/L (ref 15–41)
Albumin: 2.8 g/dL — ABNORMAL LOW (ref 3.5–5.0)
Alkaline Phosphatase: 50 U/L (ref 38–126)
Anion gap: 11 (ref 5–15)
BUN: 20 mg/dL (ref 8–23)
CO2: 24 mmol/L (ref 22–32)
Calcium: 8.7 mg/dL — ABNORMAL LOW (ref 8.9–10.3)
Chloride: 104 mmol/L (ref 98–111)
Creatinine, Ser: 1.84 mg/dL — ABNORMAL HIGH (ref 0.61–1.24)
GFR, Estimated: 37 mL/min — ABNORMAL LOW (ref >60)
Glucose, Bld: 88 mg/dL (ref 70–99)
Potassium: 4.5 mmol/L (ref 3.5–5.1)
Sodium: 139 mmol/L (ref 135–145)
Total Bilirubin: 1 mg/dL (ref 0.3–1.2)
Total Protein: 7.3 g/dL (ref 6.5–8.1)

## 2020-10-24 LAB — CBC
HCT: 43.6 % (ref 39.0–52.0)
Hemoglobin: 12.6 g/dL — ABNORMAL LOW (ref 13.0–17.0)
MCH: 23.6 pg — ABNORMAL LOW (ref 26.0–34.0)
MCHC: 28.9 g/dL — ABNORMAL LOW (ref 30.0–36.0)
MCV: 81.6 fL (ref 80.0–100.0)
Platelets: 156 10*3/uL (ref 150–400)
RBC: 5.34 MIL/uL (ref 4.22–5.81)
RDW: 19.4 % — ABNORMAL HIGH (ref 11.5–15.5)
WBC: 22.4 10*3/uL — ABNORMAL HIGH (ref 4.0–10.5)
nRBC: 0 % (ref 0.0–0.2)

## 2020-10-24 LAB — LACTIC ACID, PLASMA
Lactic Acid, Venous: 2 mmol/L (ref 0.5–1.9)
Lactic Acid, Venous: 1.8 mmol/L (ref 0.5–1.9)

## 2020-10-24 LAB — RESP PANEL BY RT-PCR (FLU A&B, COVID) ARPGX2
Influenza A by PCR: NEGATIVE
Influenza B by PCR: NEGATIVE
SARS Coronavirus 2 by RT PCR: NEGATIVE

## 2020-10-24 MED ORDER — POTASSIUM CHLORIDE CRYS ER 10 MEQ PO TBCR
10.0000 meq | EXTENDED_RELEASE_TABLET | Freq: Two times a day (BID) | ORAL | Status: DC
  Administered 2020-10-24 – 2020-10-27 (×6): 10 meq via ORAL
  Filled 2020-10-24 (×6): qty 1

## 2020-10-24 MED ORDER — FUROSEMIDE 10 MG/ML IJ SOLN
80.0000 mg | Freq: Two times a day (BID) | INTRAMUSCULAR | Status: DC
  Administered 2020-10-25 – 2020-10-26 (×5): 80 mg via INTRAVENOUS
  Filled 2020-10-24 (×6): qty 8

## 2020-10-24 MED ORDER — ALLOPURINOL 300 MG PO TABS
300.0000 mg | ORAL_TABLET | Freq: Every day | ORAL | Status: DC
  Administered 2020-10-24 – 2020-10-28 (×5): 300 mg via ORAL
  Filled 2020-10-24 (×5): qty 1

## 2020-10-24 MED ORDER — IRBESARTAN 150 MG PO TABS: 150.0000 mg | ORAL_TABLET | Freq: Every day | ORAL | Status: DC

## 2020-10-24 MED ORDER — APIXABAN 5 MG PO TABS
5.0000 mg | ORAL_TABLET | Freq: Two times a day (BID) | ORAL | Status: DC
  Administered 2020-10-24 – 2020-10-28 (×9): 5 mg via ORAL
  Filled 2020-10-24 (×9): qty 1

## 2020-10-24 MED ORDER — LEVOTHYROXINE SODIUM 25 MCG PO TABS
25.0000 ug | ORAL_TABLET | Freq: Every day | ORAL | Status: DC
  Administered 2020-10-24 – 2020-10-28 (×5): 25 ug via ORAL
  Filled 2020-10-24 (×6): qty 1

## 2020-10-24 MED ORDER — SODIUM CHLORIDE 0.9 % IV SOLN
2.0000 g | INTRAVENOUS | Status: DC
  Administered 2020-10-25 – 2020-10-28 (×4): 2 g via INTRAVENOUS
  Filled 2020-10-24 (×3): qty 20
  Filled 2020-10-24: qty 2
  Filled 2020-10-24 (×2): qty 20
  Filled 2020-10-24: qty 2

## 2020-10-24 MED ORDER — ACETAMINOPHEN 650 MG RE SUPP: 650.0000 mg | Freq: Four times a day (QID) | RECTAL | Status: DC | PRN

## 2020-10-24 MED ORDER — FUROSEMIDE 10 MG/ML IJ SOLN
60.0000 mg | Freq: Once | INTRAMUSCULAR | Status: AC
  Administered 2020-10-24: 60 mg via INTRAVENOUS
  Filled 2020-10-24: qty 6

## 2020-10-24 MED ORDER — LACTATED RINGERS IV BOLUS
1000.0000 mL | Freq: Once | INTRAVENOUS | Status: AC
  Administered 2020-10-24: 1000 mL via INTRAVENOUS

## 2020-10-24 MED ORDER — TAMSULOSIN HCL 0.4 MG PO CAPS
0.4000 mg | ORAL_CAPSULE | Freq: Every day | ORAL | Status: DC
  Administered 2020-10-24 – 2020-10-28 (×5): 0.4 mg via ORAL
  Filled 2020-10-24 (×5): qty 1

## 2020-10-24 MED ORDER — FUROSEMIDE 20 MG PO TABS: 40.0000 mg | ORAL_TABLET | Freq: Every day | ORAL | Status: DC

## 2020-10-24 MED ORDER — SODIUM CHLORIDE 0.9 % IV SOLN: 2.0000 g | Freq: Two times a day (BID) | INTRAVENOUS | Status: DC

## 2020-10-24 MED ORDER — CARVEDILOL 25 MG PO TABS
25.0000 mg | ORAL_TABLET | Freq: Two times a day (BID) | ORAL | Status: DC
  Administered 2020-10-24 – 2020-10-28 (×9): 25 mg via ORAL
  Filled 2020-10-24: qty 1
  Filled 2020-10-24: qty 2
  Filled 2020-10-24 (×4): qty 1
  Filled 2020-10-24: qty 8
  Filled 2020-10-24 (×2): qty 1

## 2020-10-24 MED ORDER — VANCOMYCIN HCL 1750 MG/350ML IV SOLN: 1750.0000 mg | INTRAVENOUS | Status: DC

## 2020-10-24 MED ORDER — LEVOTHYROXINE SODIUM 25 MCG PO TABS: 25.0000 ug | ORAL_TABLET | Freq: Every day | ORAL | Status: DC

## 2020-10-24 MED ORDER — SENNOSIDES-DOCUSATE SODIUM 8.6-50 MG PO TABS
1.0000 | ORAL_TABLET | Freq: Every evening | ORAL | Status: DC | PRN
  Administered 2020-10-25 – 2020-10-26 (×2): 1 via ORAL
  Filled 2020-10-24 (×2): qty 1

## 2020-10-24 MED ORDER — SODIUM CHLORIDE 0.9 % IV SOLN
1.0000 g | INTRAVENOUS | Status: DC
  Administered 2020-10-24: 1 g via INTRAVENOUS
  Filled 2020-10-24: qty 10

## 2020-10-24 MED ORDER — LACTATED RINGERS IV SOLN: INTRAVENOUS | Status: AC

## 2020-10-24 MED ORDER — ACETAMINOPHEN 325 MG PO TABS
650.0000 mg | ORAL_TABLET | Freq: Four times a day (QID) | ORAL | Status: DC | PRN
  Administered 2020-10-24: 650 mg via ORAL
  Filled 2020-10-24: qty 2

## 2020-10-24 MED ORDER — DILTIAZEM HCL-DEXTROSE 125-5 MG/125ML-% IV SOLN (PREMIX)
5.0000 mg/h | INTRAVENOUS | Status: DC
  Administered 2020-10-24: 5 mg/h via INTRAVENOUS
  Administered 2020-10-24: 10 mg/h via INTRAVENOUS
  Administered 2020-10-24 – 2020-10-26 (×4): 15 mg/h via INTRAVENOUS
  Filled 2020-10-24 (×5): qty 125

## 2020-10-24 MED ORDER — DILTIAZEM LOAD VIA INFUSION
10.0000 mg | Freq: Once | INTRAVENOUS | Status: AC
  Administered 2020-10-24: 10 mg via INTRAVENOUS
  Filled 2020-10-24: qty 10

## 2020-10-24 MED ORDER — DILTIAZEM HCL 25 MG/5ML IV SOLN
10.0000 mg | INTRAVENOUS | Status: DC | PRN
  Administered 2020-10-24: 15 mg via INTRAVENOUS
  Filled 2020-10-24 (×2): qty 5

## 2020-10-24 NOTE — Treatment Plan (Signed)
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 Leukocytosis to 14.9, Cr 1.7 from baseline 1.2-1.3, UA with large RBC/WBC, few bacteria and negative nitrites. CT abd/pelvis was obtained which shows encrusted right ureteral stent in place with moderate hydronephrosis and adjacent R ureteral stone 52mm stable from prior as well as bilateral nonobstructive calculi.  It seems he saw [facility] in 08/2020 where possible cysto,ureteroscopy and removal of encrusted stent with stent exchange was discussed (he has had this stent in place for 3 years), however was ultimately not pursued given his poor health and major medical problems which would put him at high risk during the procedure. The plan was to discuss possible procedure again in the new year.  His prostate cancer was also discussed at that time (he continues to keep the diagnosis from his family) and plan was for him to continue ADT.  Given his right kidney is atrophic and chronically obstructed and stent has remained in place for >3years, no acute urologic intervention is indicated at this time. Acutely elevated Cr is likely secondary to current sepsis as his kidney has been atrophic for some time. It is probable that the encrusted stent and bilateral calculi could be a nidus of recurrent urinary infection, however intervention for stent removal/exchange would not be pursued during acute infection regardless. Would recommend medical support and resuscitation per medical team and course of culture directed antibiotics for 15Q for complicated UTI with close follow up with his urologist in [place] to discuss ongoing management of both his stent and his prostate cancer.    [facility]

## 2020-10-24 NOTE — Progress Notes (Signed)
PROGRESS NOTE        PATIENT DETAILS Name: Terry [name] Age: 79 y.o. Sex: male Date of Birth: 09-Nov-1941 Admit Date: 10/23/2020 Admitting Physician Eben Burow, MD WYO:VZCHYIF, [name], MD  Brief Narrative: Patient is a 79 y.o. male PAF on anticoagulation, chronic diastolic heart failure, HTN, CKD stage IIIb, DM-2, prostate cancer, hypothyroidism, chronic bilateral lower extremity lymphedema-who presented with fever and generalized weakness-found to have complicated UTI, AKI-started on IV antibiotics and admitted to the hospitalist service.  See below for further details  Significant events: 12/9>> admit to [facility] for fever/weakness-complicated UTI, AKI  Significant studies: 12/10>> CT abdomen/pelvis: Chronic hydronephrosis/urothelial thickening of the right-stable stent.  History of prostate cancer with stable pelvic adenopathy 12/10>> chest x-ray: No pneumonia  Antimicrobial therapy: Vancomycin: 12/9 x 1  Flagyl: 12/9 >> 12/10  Cefepime: 12/9 x 1  Ceftriaxone: 12/10 >>  Microbiology data: 12/9>> blood culture: Pending 12/10>> urine culture: Pending  Procedures : None  Consults: None  DVT Prophylaxis : apixaban (ELIQUIS) tablet 5 mg    Subjective: Feels better-acknowledges some dysuria/frequency of the past few days.  Assessment/Plan: Sepsis due to complicated UTI: Sepsis physiology has improved-continue Rocephin-await culture data-follow clinical course.  AKI on CKD stage IIIb: AKI likely hemodynamically mediated-hold Lasix, losartan-gently hydrate-reassess tomorrow.  HTN: Hold losartan-continue Coreg-follow and optimize.  Chronic diastolic heart failure/bilateral lower extremity edema: Volume status relatively stable-has significant lower extremity lymphedema at baseline.  Hold Lasix-patient agreeable for compressive wraps-order placed.  PAF: Maintaining sinus rhythm-continue Coreg-on Eliquis.  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  Metastatic prostate cancer: Resume outpatient follow-up with oncology/urology  BPH: Continue Flomax  Hypothyroidism: Continue Synthroid  Gout: Continue allopurinol  Morbid Obesity: Estimated body mass index is 48.55 kg/m as calculated from the following:   Height as of this encounter: 5\' 7"  (1.702 m).   Weight as of this encounter: 140.6 kg.    Diet: Diet Order            Diet Heart Room service appropriate? Yes; Fluid consistency: Thin  Diet effective now                  Code Status: Full code   Family Communication: Spoke with (daughter-Lisa (is a [phone]) over phone on 12/10  Disposition Plan: Status is: Inpatient  Remains inpatient appropriate because:Inpatient level of care appropriate due to severity of illness   Dispo: The patient is from: Home              Anticipated d/c is to: Home              Anticipated d/c date is: 3 days              Patient currently is not medically stable to d/c.   Barriers to Discharge: Sepsis physiology due to UTI with AKI-requiring IV antibiotics-not yet medically stable for discharge  Antimicrobial agents: Anti-infectives (From admission, onward)   Start     Dose/Rate Route Frequency Ordered Stop   10/24/20 2200  vancomycin (VANCOREADY) IVPB 1750 mg/350 mL  Status:  Discontinued        1,750 mg 175 mL/hr over 120 Minutes Intravenous Every 24 hours 10/24/20 0107 10/24/20 0741   10/24/20 1000  ceFEPIme (MAXIPIME) 2 g in sodium chloride 0.9 % 100 mL IVPB  Status:  Discontinued        2 g 200 mL/hr over 30 Minutes Intravenous Every 12 hours 10/24/20 0107 10/24/20 0705   10/24/20 0715  cefTRIAXone (ROCEPHIN) 1 g in sodium chloride 0.9 % 100 mL IVPB        1 g 200 mL/hr over 30 Minutes Intravenous Every 24 hours 10/24/20 0705      10/23/20 2300  vancomycin (VANCOCIN) 2,500 mg in sodium chloride 0.9 % 500 mL IVPB        2,500 mg 250 mL/hr over 120 Minutes Intravenous  Once 10/23/20 2203 10/24/20 0245   10/23/20 2215  ceFEPIme (MAXIPIME) 2 g in sodium chloride 0.9 % 100 mL IVPB        2 g 200 mL/hr over 30 Minutes Intravenous  Once 10/23/20 2201 10/23/20 2353   10/23/20 2215  vancomycin (VANCOCIN) IVPB 1000 mg/200 mL premix  Status:  Discontinued        1,000 mg 200 mL/hr over 60 Minutes Intravenous  Once 10/23/20 2201 10/23/20 2203   10/23/20 2215  metroNIDAZOLE (FLAGYL) tablet 500 mg  Status:  Discontinued        500 mg Oral Every 8 hours 10/23/20 2201 10/24/20 0742       Time spent: 35 minutes-Greater than 50% of this time was spent in counseling, explanation of diagnosis, planning of further management, and coordination of care.  MEDICATIONS: Scheduled Meds: . allopurinol  300 mg Oral Daily  . apixaban  5 mg Oral BID  . carvedilol  25 mg Oral BID WC  . levothyroxine  25 mcg Oral Q0600  . tamsulosin  0.4 mg Oral Daily   Continuous Infusions: . cefTRIAXone (ROCEPHIN)  IV Stopped (10/24/20 9211)  . lactated ringers Stopped (10/24/20 0742)  . lactated ringers 75 mL/hr at 10/24/20 0742   PRN Meds:.acetaminophen **OR** acetaminophen, senna-docusate   PHYSICAL EXAM: Vital signs: Vitals:   10/24/20 0845 10/24/20 0915 10/24/20 0930 10/24/20 0945  BP: 137/72 (!) 147/88 (!) 151/93 (!) 161/79  Pulse: 93 90 86 93  Resp: (!) 28 (!) 35 (!) 33 (!) 26  Temp:      TempSrc:      SpO2: 99% 99% 98% 100%  Weight:      Height:       Filed Weights   10/23/20 2154  Weight: (!) 140.6 kg   Body mass index is 48.55 kg/m.   Gen Exam:Alert awake-not in any distress HEENT:atraumatic, normocephalic Chest: B/L clear to auscultation anteriorly CVS:S1S2 regular Abdomen:soft non tender, non distended Extremities: Chronic lower extremity edema/lymphedema Neurology: Non focal Skin: no rash  I have personally  reviewed following labs and imaging studies  LABORATORY DATA: CBC: Recent Labs  Lab 10/23/20 2201 10/24/20 0751  WBC 14.9* 22.4*  NEUTROABS 13.7*  --   HGB 11.9* 12.6*  HCT 38.5* 43.6  MCV 78.1* 81.6  PLT 221 941    Basic Metabolic Panel: Recent Labs  Lab 10/23/20 2201 10/24/20 0751  NA 140 139  K 3.7 4.5  CL 105 104  CO2 24 24  GLUCOSE 92 88  BUN 20 20  CREATININE 1.70* 1.84*  CALCIUM 9.1 8.7*    GFR: Estimated Creatinine Clearance: 44.2 mL/min (A) (by C-G formula based on SCr of 1.84 mg/dL (H)).  Liver Function Tests: Recent Labs  Lab 10/23/20 2201 10/24/20 0751  AST 22 27  ALT 14 13  ALKPHOS 53 50  BILITOT 0.8  1.0  PROT 7.3 7.3  ALBUMIN 3.1* 2.8*   No results for input(s): LIPASE, AMYLASE in the last 168 hours. No results for input(s): AMMONIA in the last 168 hours.  Coagulation Profile: Recent Labs  Lab 10/23/20 2201  INR 1.3*    Cardiac Enzymes: No results for input(s): CKTOTAL, CKMB, CKMBINDEX, TROPONINI in the last 168 hours.  BNP (last 3 results) No results for input(s): PROBNP in the last 8760 hours.  Lipid Profile: No results for input(s): CHOL, HDL, LDLCALC, TRIG, CHOLHDL, LDLDIRECT in the last 72 hours.  Thyroid Function Tests: No results for input(s): TSH, T4TOTAL, FREET4, T3FREE, THYROIDAB in the last 72 hours.  Anemia Panel: No results for input(s): VITAMINB12, FOLATE, FERRITIN, TIBC, IRON, RETICCTPCT in the last 72 hours.  Urine analysis:    Component Value Date/Time   COLORURINE AMBER (A) 10/24/2020 0423   APPEARANCEUR CLOUDY (A) 10/24/2020 0423   APPEARANCEUR Cloudy (A) 08/21/2020 1530   LABSPEC 1.015 10/24/2020 0423   PHURINE 6.0 10/24/2020 0423   GLUCOSEU NEGATIVE 10/24/2020 0423   HGBUR LARGE (A) 10/24/2020 0423   BILIRUBINUR NEGATIVE 10/24/2020 0423   BILIRUBINUR Negative 08/21/2020 1530   KETONESUR NEGATIVE 10/24/2020 0423   PROTEINUR 100 (A) 10/24/2020 0423   NITRITE NEGATIVE 10/24/2020 0423   LEUKOCYTESUR  LARGE (A) 10/24/2020 0423    Sepsis Labs: Lactic Acid, Venous    Component Value Date/Time   LATICACIDVEN 1.8 10/24/2020 0130    MICROBIOLOGY: Recent Results (from the past 240 hour(s))  Resp Panel by RT-PCR (Flu A&B, Covid) Nasopharyngeal Swab     Status: None   Collection Time: 10/23/20 10:58 PM   Specimen: Nasopharyngeal Swab; Nasopharyngeal(NP) swabs in vial transport medium  Result Value Ref Range Status   SARS Coronavirus 2 by RT PCR NEGATIVE NEGATIVE Final    Comment: (NOTE) SARS-CoV-2 target nucleic acids are NOT DETECTED.  The SARS-CoV-2 RNA is generally detectable in upper respiratory specimens during the acute phase of infection. The lowest concentration of SARS-CoV-2 viral copies this assay can detect is 138 copies/mL. A negative result does not preclude SARS-Cov-2 infection and should not be used as the sole basis for treatment or other patient management decisions. A negative result may occur with  improper specimen collection/handling, submission of specimen other than nasopharyngeal swab, presence of viral mutation(s) within the areas targeted by this assay, and inadequate number of viral copies(<138 copies/mL). A negative result must be combined with clinical observations, patient history, and epidemiological information. The expected result is Negative.  Fact Sheet for Patients:  EntrepreneurPulse.com.au  Fact Sheet for Healthcare Providers:  IncredibleEmployment.be  This test is no t yet approved or cleared by the [place] FDA and  has been authorized for detection and/or diagnosis of SARS-CoV-2 by FDA under an Emergency Use Authorization (EUA). This EUA will remain  in effect (meaning this test can be used) for the duration of the COVID-19 declaration under Section 564(b)(1) of the Act, 21 U.S.C.section 360bbb-3(b)(1), unless the authorization is terminated  or revoked sooner.       Influenza A by PCR  NEGATIVE NEGATIVE Final   Influenza B by PCR NEGATIVE NEGATIVE Final    Comment: (NOTE) The Xpert Xpress SARS-CoV-2/FLU/RSV plus assay is intended as an aid in the diagnosis of influenza from Nasopharyngeal swab specimens and should not be used as a sole basis for treatment. Nasal washings and aspirates are unacceptable for Xpert Xpress SARS-CoV-2/FLU/RSV testing.  Fact Sheet for Patients: EntrepreneurPulse.com.au  Fact Sheet for Healthcare Providers: IncredibleEmployment.be  This  test is not yet approved or cleared by the [place] and has been authorized for detection and/or diagnosis of SARS-CoV-2 by FDA under an Emergency Use Authorization (EUA). This EUA will remain in effect (meaning this test can be used) for the duration of the COVID-19 declaration under Section 564(b)(1) of the Act, 21 U.S.C. section 360bbb-3(b)(1), unless the authorization is terminated or revoked.  Performed at [facility], [place] [address] [place], [place] [address]     RADIOLOGY STUDIES/RESULTS: CT ABDOMEN PELVIS WO CONTRAST  Result Date: 10/24/2020 CLINICAL DATA:  Acute nonlocalized abdominal pain. EXAM: CT ABDOMEN AND PELVIS WITHOUT CONTRAST TECHNIQUE: Multidetector CT imaging of the abdomen and pelvis was performed following the standard protocol without IV contrast. COMPARISON:  08/21/2020 FINDINGS: Lower chest:  No contributory findings. Hepatobiliary: No focal liver abnormality.No evidence of biliary obstruction or stone. Pancreas: Unremarkable. Spleen: Unremarkable. Adrenals/Urinary Tract: Negative adrenals. Stented right kidney with severe scarring and multifocal lower pole moiety calculi. There are superimposed renal cysts. The intrarenal collecting system is similarly patulous to prior. A stone is seen in the upper ureter adjacent to the stent, 7 mm in diameter. Small left upper and lower pole renal calculi. Unremarkable bladder. Stomach/Bowel: No  obstruction. No appendicitis. Bowel anastomosis in the left abdomen. Vascular/Lymphatic: No acute vascular abnormality. Stable mild enlargement of pelvic lymph nodes measuring up to 14 mm short axis at the right node of [place] Reproductive:Symmetric enlargement of the prostate. There is a reported history of prostate cancer on prior imaging. Other: No ascites or pneumoperitoneum. Musculoskeletal: No acute abnormalities. Scar-like appearance in the right gluteal region. There are a few sclerotic areas most notably seen in the L5 left body. Bone scan was performed October 2021. 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 degenerative disease. IMPRESSION: 1. No acute finding or change from October 2021. 2. Chronic hydronephrosis and urothelial thickening on the right with stable stent positioning. Multiple right renal and ureteral calculi. 3. Small left renal calculi. 4. History of prostate cancer with stable pelvic adenopathy. Electronically Signed   By: Monte Fantasia M.D.   On: 10/24/2020 04:53   DG Chest 1 View  Result Date: 10/24/2020 CLINICAL DATA:  Weakness. EXAM: CHEST  1 VIEW COMPARISON:  May 01, 2006 FINDINGS: There is no evidence of acute infiltrate, pleural effusion or pneumothorax. Mild suprahilar and infrahilar prominence of the pulmonary vasculature is seen. The cardiac silhouette is mildly enlarged. Degenerative changes seen throughout the thoracic spine. IMPRESSION: Mild cardiomegaly with mild pulmonary vascular congestion. Electronically Signed   By: Virgina Norfolk M.D.   On: 10/24/2020 00:12     LOS: 0 days   Oren Binet, MD  [facility]    To contact the attending provider between 7A-7P or the covering provider during after hours 7P-7A, please log into the web site [email or web address] and access using universal [place] password for that web site. If you do not have the password, please call the hospital operator.  10/24/2020, 10:46 AM

## 2020-10-24 NOTE — ED Notes (Signed)
Lunch Tray Ordered @ 1050. 

## 2020-10-24 NOTE — H&P (Signed)
History and Physical    Terry [name] NWG:[id number] DOB: 1941/03/07 DOA: 10/23/2020  PCP: Susy Frizzle, MD   Patient coming from:   Home  Chief Complaint: generalized weakness, fever  HPI: Terry [name] is a 79 y.o. male with medical history significant for paroxysmal A. Fib, HFpEF, HTN, CKD 3, DMT2 diet controlled, hypothyroidism, prostate cancer who presents by EMS with complaint of generalized weakness. He reports that the past two days has been feeling weaker than normal and yesterday could not get in or out of bed without assistence which is not normal for him. He is also reportedly been running a fever and has had some urinary incontinence. Family is suspicious of UTI. Patient also has history of colon and prostate cancer. He has chronic SOB with exertion. He denies any cough.  Denies abdominal pain.  He has not had nausea, vomiting or diarrhea. Denies and specific symptoms, but states that he just doesn't feel well. Lives with his daughter. Denies tobacco, alcohol or illicit drug use.   ED Course: Mr. Schwegler is found to have sepsis with fever, tachycardia, tachypnea and elevated white blood cell count with urinalysis positive for UTI.  Review of Systems:  General: Reports weakness, fever, chills. weight loss, night sweats.  Denies dizziness.  Denies change in appetite HENT: Denies head trauma, headache, denies change in hearing, tinnitus.  Denies nasal congestion or bleeding.  Denies sore throat, sores in mouth.  Denies difficulty swallowing Eyes: Denies blurry vision, pain in eye, drainage.  Denies discoloration of eyes. Neck: Denies pain.  Denies swelling.  Denies pain with movement. Cardiovascular: Denies chest pain, palpitations.  Has chronic edema of legs.  Denies orthopnea Respiratory: Denies shortness of breath, cough.  Denies wheezing.  Denies sputum production Gastrointestinal: Denies abdominal pain, swelling.  Denies nausea, vomiting, diarrhea.  Denies melena.  Denies  hematemesis. Musculoskeletal: Denies limitation of movement.  Denies deformity.  Denies pain.  Denies arthralgias or myalgias. Genitourinary: Denies pelvic pain.  Denies urinary frequency or hesitancy.  Denies dysuria.  Skin: Denies rash.  Denies petechiae, purpura, ecchymosis. Neurological: Denies headache.  Denies syncope.  Denies seizure activity.  Denies weakness or paresthesia.  Denies slurred speech, drooping face.  Denies visual change. Psychiatric: Denies depression, anxiety.  Denies suicidal thoughts or ideation.  Denies hallucinations.  Past Medical History:  Diagnosis Date  . Arthritis   . Cancer ([place])    Phreesia 08/04/2020  . CHF, chronic (HCC)    diastolic  . CKD (chronic kidney disease) stage 3, GFR 30-59 ml/min (HCC)   . Diabetes mellitus without complication ([place])   . Dysrhythmia   . Hypertension   . Hypothyroidism   . Paroxysmal atrial fibrillation (HCC)   . Prostate cancer [facility])     Past Surgical History:  Procedure Laterality Date  . COLON SURGERY N/A    Phreesia 08/04/2020    Social History  reports that he has never smoked. He has never used smokeless tobacco. He reports current alcohol use. He reports current drug use. Drug: Marijuana.  Allergies  Allergen Reactions  . Lisinopril     FACIAL SWELLING    History reviewed. No pertinent family history.   Prior to Admission medications   Medication Sig Start Date End Date Taking? Authorizing Provider  allopurinol (ZYLOPRIM) 300 MG tablet Take 1 tablet (300 mg total) by mouth daily. 10/22/20  Yes Susy Frizzle, MD  apixaban (ELIQUIS) 5 MG TABS tablet Take 1 tablet (5 mg total) by mouth 2 (two) times daily. 10/20/20  Yes Minna Merritts, MD  carvedilol (COREG) 25 MG tablet Take 1 tablet (25 mg total) by mouth 2 (two) times daily with a meal. 10/20/20  Yes Gollan, Kathlene November, MD  fish oil-omega-3 fatty acids 1000 MG capsule Take 2 g by mouth daily.   Yes [provider]  furosemide (LASIX) 40  MG tablet Take one tablet (40MG ) by mouth Monday, Tuesday, and Wednesdays 10/20/20  Yes Susy Frizzle, MD  ibuprofen (ADVIL,MOTRIN) 200 MG tablet Take 200 mg by mouth every 6 (six) hours as needed for fever or headache. FOR HEADACHES   Yes [provider]  levothyroxine (SYNTHROID) 25 MCG tablet Take 25 mcg by mouth daily before breakfast.   Yes [provider]  Multiple Vitamins-Minerals (MULTIVITAMINS THER. W/MINERALS) TABS Take 1 tablet by mouth daily.   Yes [provider]  tamsulosin (FLOMAX) 0.4 MG CAPS capsule Take 0.4 mg by mouth daily. 06/17/20  Yes [provider]  telmisartan (MICARDIS) 40 MG tablet Take 1 tablet (40 mg total) by mouth daily. 10/20/20  Yes Minna Merritts, MD  traMADol Veatrice Bourbon) 50 MG tablet Take 1 tablet by mouth twice daily as needed for pain 10/21/20  Yes Susy Frizzle, MD    Physical Exam: Vitals:   10/24/20 0300 10/24/20 0345 10/24/20 0445 10/24/20 0545  BP: 116/66 133/78 117/71 (!) 158/88  Pulse: 93 99 96 100  Resp: (!) 34 (!) 26 (!) 24 (!) 23  Temp:      TempSrc:      SpO2: 98% 90% 99% 99%  Weight:      Height:        Constitutional: NAD, calm, comfortable Vitals:   10/24/20 0300 10/24/20 0345 10/24/20 0445 10/24/20 0545  BP: 116/66 133/78 117/71 (!) 158/88  Pulse: 93 99 96 100  Resp: (!) 34 (!) 26 (!) 24 (!) 23  Temp:      TempSrc:      SpO2: 98% 90% 99% 99%  Weight:      Height:       General: WDWN, Alert and oriented x3.  Eyes: EOMI, PERRL, conjunctivae normal.  Sclera nonicteric HENT:  [place]urns/AT, external ears normal.  Nares patent without epistasis.  Mucous membranes are moist. Posterior pharynx clear of any exudate or lesions.  Neck: Soft, normal range of motion, supple, no masses, no thyromegaly.  Trachea midline Respiratory: clear to auscultation bilaterally, no wheezing, no crackles. Normal respiratory effort. No accessory muscle use.  Cardiovascular: normal sinus rhythm. no murmurs / rubs /  gallops. Has chronic lower extremity edema Abdomen: Soft, no tenderness, nondistended, morbidly obese.  no rebound or guarding.  No masses palpated. Bowel sounds normoactive Musculoskeletal: Moves all extremities spontaneously.  No clubbing / cyanosis. No joint deformity upper and lower extremities. Normal muscle tone.  Skin: Warm, dry, intact no rashes, lesions, ulcers. No induration Neurologic: CN 2-12 grossly intact.  Normal speech.  Sensation intact Psychiatric: Normal judgment and insight.  Normal mood.    Labs on Admission: I have personally reviewed following labs and imaging studies  CBC: Recent Labs  Lab 10/23/20 2201  WBC 14.9*  NEUTROABS 13.7*  HGB 11.9*  HCT 38.5*  MCV 78.1*  PLT 562    Basic Metabolic Panel: Recent Labs  Lab 10/23/20 2201  NA 140  K 3.7  CL 105  CO2 24  GLUCOSE 92  BUN 20  CREATININE 1.70*  CALCIUM 9.1    GFR: Estimated Creatinine Clearance: 47.8 mL/min (A) (by C-G formula based on  SCr of 1.7 mg/dL (H)).  Liver Function Tests: Recent Labs  Lab 10/23/20 2201  AST 22  ALT 14  ALKPHOS 53  BILITOT 0.8  PROT 7.3  ALBUMIN 3.1*    Urine analysis:    Component Value Date/Time   COLORURINE AMBER (A) 10/24/2020 0423   APPEARANCEUR CLOUDY (A) 10/24/2020 0423   APPEARANCEUR Cloudy (A) 08/21/2020 1530   LABSPEC 1.015 10/24/2020 0423   PHURINE 6.0 10/24/2020 0423   GLUCOSEU NEGATIVE 10/24/2020 0423   HGBUR LARGE (A) 10/24/2020 0423   BILIRUBINUR NEGATIVE 10/24/2020 0423   BILIRUBINUR Negative 08/21/2020 1530   KETONESUR NEGATIVE 10/24/2020 0423   PROTEINUR 100 (A) 10/24/2020 0423   NITRITE NEGATIVE 10/24/2020 0423   LEUKOCYTESUR LARGE (A) 10/24/2020 0423    Radiological Exams on Admission: CT ABDOMEN PELVIS WO CONTRAST  Result Date: 10/24/2020 CLINICAL DATA:  Acute nonlocalized abdominal pain. EXAM: CT ABDOMEN AND PELVIS WITHOUT CONTRAST TECHNIQUE: Multidetector CT imaging of the abdomen and pelvis was performed following the  standard protocol without IV contrast. COMPARISON:  08/21/2020 FINDINGS: Lower chest:  No contributory findings. Hepatobiliary: No focal liver abnormality.No evidence of biliary obstruction or stone. Pancreas: Unremarkable. Spleen: Unremarkable. Adrenals/Urinary Tract: Negative adrenals. Stented right kidney with severe scarring and multifocal lower pole moiety calculi. There are superimposed renal cysts. The intrarenal collecting system is similarly patulous to prior. A stone is seen in the upper ureter adjacent to the stent, 7 mm in diameter. Small left upper and lower pole renal calculi. Unremarkable bladder. Stomach/Bowel: No obstruction. No appendicitis. Bowel anastomosis in the left abdomen. Vascular/Lymphatic: No acute vascular abnormality. Stable mild enlargement of pelvic lymph nodes measuring up to 14 mm short axis at the right node of [place] [facility]:Symmetric enlargement of the prostate. There is a reported history of prostate cancer on prior imaging. Other: No ascites or pneumoperitoneum. Musculoskeletal: No acute abnormalities. Scar-like appearance in the right gluteal region. There are a few sclerotic areas most notably seen in the L5 left body. Bone scan was performed October 2021. 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 degenerative disease. IMPRESSION: 1. No acute finding or change from October 2021. 2. Chronic hydronephrosis and urothelial thickening on the right with stable stent positioning. Multiple right renal and ureteral calculi. 3. Small left renal calculi. 4. History of prostate cancer with stable pelvic adenopathy. Electronically Signed   By: Monte Fantasia M.D.   On: 10/24/2020 04:53   DG Chest 1 View  Result Date: 10/24/2020 CLINICAL DATA:  Weakness. EXAM: CHEST  1 VIEW COMPARISON:  May 01, 2006 FINDINGS: There is no evidence of acute infiltrate, pleural effusion or pneumothorax. Mild suprahilar and infrahilar prominence of the pulmonary vasculature is  seen. The cardiac silhouette is mildly enlarged. Degenerative changes seen throughout the thoracic spine. IMPRESSION: Mild cardiomegaly with mild pulmonary vascular congestion. Electronically Signed   By: Virgina Norfolk M.D.   On: 10/24/2020 00:12    Assessment/Plan Principal Problem:   Sepsis [facility]) Mr. Fawver is admitted to med surg floor with sepsis. Meets sepsis area with fever, tachycardia, tachypnea and urinary tract infection.  Patient was initially treated with cefepime, vancomycin In the emergency room prior to urine being found to be positive for infection.  Patient is placed on Rocephin for UTI.  Patient does have a ureteral stent in place with chronic hydronephrosis on CT scan.  ER provider discussed with urology who stated that patient could be treated as a normal urinary tract infection and that no instrumentation or intervention would be needed from  urology at this point Patient was given IV fluid boluses in the emergency room.  Continue LR at 75 ml/hr.  Monitor fluid status  Active Problems:   Complicated UTI (urinary tract infection) Patient placed on Rocephin for antibiotic coverage.  Culture was obtained emergency room will be monitored and antibiotic adjusted as indicated    CKD (chronic kidney disease) stage 3, GFR 30-59 ml/min (HCC) Stable kidney function Monitor electrolytes renal function with labs in morning    Essential hypertension Continue therapy with ARB and Coreg.  Monitor blood pressure    Paroxysmal atrial fibrillation (HCC) Chronic.  Continue Eliquis for anticoagulation    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)  Continue Lasix, ARB and Coreg.  Monitor fluid status    Morbid obesity due to excess calories (HCC) Chronic    DVT prophylaxis: Pt is on Eliquis for anticoagulation which will be continued Code Status:   Full code Family Communication:  Diagnosis and plan discussed with patient.  Patient verbalized  understanding and agrees with plan.  Further recommendations to follow as clinically indicated Disposition Plan:   Patient is from:  Home  Anticipated DC to:  Home  Anticipated DC date:  Anticipate greater than 2 midnight stay to treat acute medical condition  Anticipated DC barriers: No barriers to discharge identified at this time  Consults called:  Urology Admission status:  Inpatient   Yevonne Aline Wanya Bangura MD [facility]  How to contact the [facility] Attending or Consulting provider [place] or covering provider during after hours [place], for this patient?   1. Check the care team in [facility] and look for a) attending/consulting [facility] provider listed and b) the [facility] team listed 2. Log into [email or web address] and use [place]'s universal password to access. If you do not have the password, please contact the hospital operator. 3. Locate the [facility] provider you are looking for under [facility] and page to a number that you can be directly reached. 4. If you still have difficulty reaching the provider, please page the [facility] (Director on Call) for the Hospitalists listed on amion for assistance.  10/24/2020, 5:52 AM

## 2020-10-24 NOTE — Progress Notes (Signed)
Pharmacy Antibiotic Note  Terry [name] is a 79 y.o. male admitted on 10/23/2020 with sepsis.  Pharmacy has been consulted for Vancomycin and Cefepime dosing. Pt also on po Flagyl.  Noted SCr up to 1.7 (1.4 ~1 mos ago)  Plan: Cefepime 2gm IV q12h Vancomycin 2500mg  load given then  1750 mg IV Q 24 hrs. Will f/u renal function, micro data, and pt's clinical condition Vanc levels prn   Height: 5\' 7"  (170.2 cm) Weight: (!) 140.6 kg (310 lb) IBW/kg (Calculated) : 66.1  Temp (24hrs), Avg:101.7 F (38.7 C), Min:101.2 F (38.4 C), Max:102.1 F (38.9 C)  Recent Labs  Lab 10/23/20 2201 10/23/20 2306  WBC 14.9*  --   CREATININE 1.70*  --   LATICACIDVEN  --  2.0*    Estimated Creatinine Clearance: 47.8 mL/min (A) (by C-G formula based on SCr of 1.7 mg/dL (H)).    Allergies  Allergen Reactions  . Lisinopril     FACIAL SWELLING    Antimicrobials this admission: 12/9 Vanc >>  12/9 Cefepime >>  12/9 Flagyl >>  Microbiology results: 12/9 BCx:   UCx:   Thank you for allowing pharmacy to be a part of this patient's care.  Sherlon Handing, PharmD, BCPS Please see amion for complete clinical pharmacist phone list 10/24/2020 1:02 AM

## 2020-10-24 NOTE — Consult Note (Addendum)
Cardiology Consultation:   Patient ID: Terry [name] MRN: [id number]; DOB: Apr 02, 1941  Admit date: 10/23/2020 Date of Consult: 10/24/2020  Primary Care Provider: Susy Frizzle, MD [facility] Cardiologist: Ida Rogue, MD  [facility] Electrophysiologist:  None    Patient Profile:   Terry [name] is a 79 y.o. male with a hx of CKD-3, HTN, lower ext. Edema (lympedema), morbid obesity, CHF, PAF, and prostate and colon cancer admitted with generalized weakness, could not get out of bed without assistance.  + fever, +urinary incontinence and sepsis, who is being seen today for the evaluation of atrial fib at the request of Dr. Tonie Griffith.  History of Present Illness:   Mr. Mode with HTN, CKD-2, lymphedema, PAF, chronic diastolic CHF now presents with sepsis with complicated UTI.  He was in [place] in Sept 2021.  Pt was in SR on admit and today at 1512 he went into a fib with RVR at 147.      IV dilt started. At 10 , no chest pain and + SOB.  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   EKG:  The EKG was personally reviewed and demonstrates:  10/23/20 ST at 109 borderline Left axis deviation.  Today at 1512 EKG with a fib with RVR at 147.  Telemetry:  Telemetry was personally reviewed and demonstrates:  A fib with RVR  Labs Na 139, K+ 4.5 BUN 20 Cr 1.84 albumin 2.8 lactic acid 2.0 WBC on admit 14.9 Hgb 11.9 and plts 221  Today WBC 22K PCXR:  IMPRESSION: Stable mild cardiomegaly with probable central pulmonary vascular congestion.  Rec'd lasix 60 mg today  Continues on Eliquis  And coreg 25 mg BID  AVAPRO held due to AKI  Synthroid held   VS BP 147/108 P 142,  Increase of HR at 1400  R 37  Temp was 101 axil and 100.9 oral   He did not take eliquis last pm but was given this AM  No coreg last pm    Past Medical History:  Diagnosis Date  . Arthritis   . Cancer ([place])    Phreesia  08/04/2020  . CHF, chronic (HCC)    diastolic  . CKD (chronic kidney disease) stage 3, GFR 30-59 ml/min (HCC)   . Diabetes mellitus without complication ([place])   . Dysrhythmia   . Hypertension   . Hypothyroidism   . Paroxysmal atrial fibrillation (HCC)   . Prostate cancer [facility])     Past Surgical History:  Procedure Laterality Date  . COLON SURGERY N/A    Phreesia 08/04/2020     Home Medications:  Prior to Admission medications   Medication Sig Start Date End Date Taking? Authorizing Provider  allopurinol (ZYLOPRIM) 300 MG tablet Take 1 tablet (300 mg total) by mouth daily. 10/22/20  Yes Susy Frizzle, MD  apixaban (ELIQUIS) 5 MG TABS tablet Take 1 tablet (5 mg total) by mouth 2 (two) times daily. 10/20/20  Yes Minna Merritts, MD  carvedilol (COREG) 25 MG tablet Take 1 tablet (25 mg total) by mouth 2 (two) times daily with a meal. 10/20/20  Yes Gollan, Kathlene November, MD  fish oil-omega-3 fatty acids 1000 MG capsule Take 2 g by mouth daily.   Yes [provider]  furosemide (LASIX) 40 MG tablet Take one tablet (40MG ) by mouth Monday, Tuesday, and Wednesdays 10/20/20  Yes Susy Frizzle, MD  ibuprofen (ADVIL,MOTRIN) 200 MG  tablet Take 200 mg by mouth every 6 (six) hours as needed for fever or headache. FOR HEADACHES   Yes [provider]  levothyroxine (SYNTHROID) 25 MCG tablet Take 25 mcg by mouth daily before breakfast.   Yes [provider]  Multiple Vitamins-Minerals (MULTIVITAMINS THER. W/MINERALS) TABS Take 1 tablet by mouth daily.   Yes [provider]  tamsulosin (FLOMAX) 0.4 MG CAPS capsule Take 0.4 mg by mouth daily. 06/17/20  Yes [provider]  telmisartan (MICARDIS) 40 MG tablet Take 1 tablet (40 mg total) by mouth daily. 10/20/20  Yes Minna Merritts, MD  traMADol Veatrice Bourbon) 50 MG tablet Take 1 tablet by mouth twice daily as needed for pain 10/21/20  Yes Susy Frizzle, MD    Inpatient Medications: Scheduled Meds: .  allopurinol  300 mg Oral Daily  . apixaban  5 mg Oral BID  . carvedilol  25 mg Oral BID WC  . levothyroxine  25 mcg Oral Q0600  . tamsulosin  0.4 mg Oral Daily   Continuous Infusions: . [START ON 10/25/2020] cefTRIAXone (ROCEPHIN)  IV    . diltiazem (CARDIZEM) infusion 10 mg/hr (10/24/20 1606)  . lactated ringers Stopped (10/24/20 1328)   PRN Meds: acetaminophen **OR** acetaminophen, diltiazem, senna-docusate  Allergies:    Allergies  Allergen Reactions  . Lisinopril     FACIAL SWELLING    Social History:   Social History   Socioeconomic History  . Marital status: Widowed    Spouse name: Not on file  . Number of children: Not on file  . Years of education: Not on file  . Highest education level: Not on file  Occupational History  . Not on file  Tobacco Use  . Smoking status: Never Smoker  . Smokeless tobacco: Never Used  Substance and Sexual Activity  . Alcohol use: Yes    Comment: rarely  . Drug use: Yes    Types: Marijuana  . Sexual activity: Yes  Other Topics Concern  . Not on file  Social History Narrative  . Not on file   Social Determinants of Health   Financial Resource Strain: Not on file  Food Insecurity: Not on file  Transportation Needs: Not on file  Physical Activity: Not on file  Stress: Not on file  Social Connections: Not on file  Intimate Partner Violence: Not on file    Family History:    Family History  Problem Relation Age of Onset  . Hypertension Mother      ROS:  Please see the history of present illness.  General:no colds + fevers, no weight changes, + weakness Skin:no rashes or ulcers HEENT:no blurred vision, no congestion CV:see HPI PUL:see HPI GI:no diarrhea constipation or melena, no indigestion GU:no hematuria, no dysuria MS:no joint pain, no claudication Neuro:no syncope, no lightheadedness Endo:+ diabetes, + thyroid disease  All other ROS reviewed and negative.     Physical Exam/Data:   Vitals:   10/24/20  1555 10/24/20 1600 10/24/20 1605 10/24/20 1615  BP:   (!) 147/108   Pulse: (!) 141 (!) 151 (!) 148 (!) 135  Resp: (!) 25 (!) 24 (!) 37 (!) 40  Temp:      TempSrc:      SpO2: 93% 95% 94% 96%  Weight:      Height:        Intake/Output Summary (Last 24 hours) at 10/24/2020 1636 Last data filed at 10/24/2020 1429 Gross per 24 hour  Intake 2900 ml  Output 300 ml  Net 2600 ml   Last 3 Weights 10/23/2020 10/20/2020 09/12/2020  Weight (lbs) 310 lb 312 lb 303 lb  Weight (kg) 140.615 kg 141.522 kg 137.44 kg     Body mass index is 48.55 kg/m.  General:  Well nourished, well developed, increased SOB - NAD  HEENT: normal Lymph: no adenopathy Neck: mild JVD Endocrine:  No thryomegaly Vascular: No carotid bruits; pedal pulses 2+ bilaterally Cardiac: irreg irreg and rapid ; no murmur gallup or rub Lungs:  clear to auscultation bilaterally, OCC wheezing, no rhonchi or rales  Abd: soft, nontender, no hepatomegaly obese Ext: no edema Musculoskeletal:  No deformities, BUE and BLE strength normal and equal Skin: warm and dry  Neuro:  Alert and oriented X 3 MAE follows commands, no focal abnormalities noted Psych:  Normal affect   Relevant CV Studies: Echo 07/29/20  IMPRESSIONS    1. Left ventricular ejection fraction, by estimation, is 60 to 65%. The  left ventricle has normal function. The left ventricle has no regional  wall motion abnormalities. There is mild left ventricular hypertrophy.  Left ventricular diastolic parameters  are consistent with Grade II diastolic dysfunction (pseudonormalization).  2. Right ventricular systolic function is normal. The right ventricular  size is mildly enlarged.  3. Left atrial size was mild to moderately dilated.  4. Right atrial size was mildly dilated.  5. The mitral valve is normal in structure. No evidence of mitral valve  regurgitation. No evidence of mitral stenosis.  6. Tricuspid valve regurgitation is mild to moderate.  7. The  aortic valve was not well visualized. Aortic valve regurgitation  is mild. Mild aortic valve stenosis.   FINDINGS  Left Ventricle: Left ventricular ejection fraction, by estimation, is 60  to 65%. The left ventricle has normal function. The left ventricle has no  regional wall motion abnormalities. The left ventricular internal cavity  size was normal in size. There is  mild left ventricular hypertrophy. Left ventricular diastolic parameters  are consistent with Grade II diastolic dysfunction (pseudonormalization).   Right Ventricle: The right ventricular size is mildly enlarged. No  increase in right ventricular wall thickness. Right ventricular systolic  function is normal.   Left Atrium: Left atrial size was mild to moderately dilated.   Right Atrium: Right atrial size was mildly dilated.   Pericardium: The pericardium was not well visualized.   Mitral Valve: The mitral valve is normal in structure. No evidence of  mitral valve regurgitation. No evidence of mitral valve stenosis.   Tricuspid Valve: The tricuspid valve is normal in structure. Tricuspid  valve regurgitation is mild to moderate.   Aortic Valve: The aortic valve was not well visualized. Aortic valve  regurgitation is mild. Mild aortic stenosis is present. Aortic valve mean  gradient measures 10.0 mmHg. Aortic valve peak gradient measures 19.5  mmHg. Aortic valve area, by VTI measures  2.16 cm.   Pulmonic Valve: The pulmonic valve was not well visualized. Pulmonic valve  regurgitation is not visualized. No evidence of pulmonic stenosis.   Aorta: The aortic root is normal in size and structure.   Pulmonary Artery: The pulmonary artery is not well seen.   IAS/Shunts: The interatrial septum was not well visualized.  Laboratory Data:  High Sensitivity Troponin:  No results for input(s): TROPONINIHS in the last 720 hours.   Chemistry Recent Labs  Lab 10/23/20 2201 10/24/20 0751  NA 140 139  K 3.7 4.5   CL 105 104  CO2 24 24  GLUCOSE 92 88  BUN 20 20  CREATININE 1.70* 1.84*  CALCIUM 9.1 8.7*  GFRNONAA 41* 37*  ANIONGAP 11 11    Recent Labs  Lab 10/23/20 2201 10/24/20 0751  PROT 7.3 7.3  ALBUMIN 3.1* 2.8*  AST 22 27  ALT 14 13  ALKPHOS 53 50  BILITOT 0.8 1.0   Hematology Recent Labs  Lab 10/23/20 2201 10/24/20 0751  WBC 14.9* 22.4*  RBC 4.93 5.34  HGB 11.9* 12.6*  HCT 38.5* 43.6  MCV 78.1* 81.6  MCH 24.1* 23.6*  MCHC 30.9 28.9*  RDW 18.9* 19.4*  PLT 221 156   BNPNo results for input(s): BNP, PROBNP in the last 168 hours.  DDimer No results for input(s): DDIMER in the last 168 hours.   Radiology/Studies:  CT ABDOMEN PELVIS WO CONTRAST  Result Date: 10/24/2020 CLINICAL DATA:  Acute nonlocalized abdominal pain. EXAM: CT ABDOMEN AND PELVIS WITHOUT CONTRAST TECHNIQUE: Multidetector CT imaging of the abdomen and pelvis was performed following the standard protocol without IV contrast. COMPARISON:  08/21/2020 FINDINGS: Lower chest:  No contributory findings. Hepatobiliary: No focal liver abnormality.No evidence of biliary obstruction or stone. Pancreas: Unremarkable. Spleen: Unremarkable. Adrenals/Urinary Tract: Negative adrenals. Stented right kidney with severe scarring and multifocal lower pole moiety calculi. There are superimposed renal cysts. The intrarenal collecting system is similarly patulous to prior. A stone is seen in the upper ureter adjacent to the stent, 7 mm in diameter. Small left upper and lower pole renal calculi. Unremarkable bladder. Stomach/Bowel: No obstruction. No appendicitis. Bowel anastomosis in the left abdomen. Vascular/Lymphatic: No acute vascular abnormality. Stable mild enlargement of pelvic lymph nodes measuring up to 14 mm short axis at the right node of [place] [employer]:Symmetric enlargement of the prostate. There is a reported history of prostate cancer on prior imaging. Other: No ascites or pneumoperitoneum. Musculoskeletal: No acute  abnormalities. Scar-like appearance in the right gluteal region. There are a few sclerotic areas most notably seen in the L5 left body. Bone scan was performed October 2021. 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 degenerative disease. IMPRESSION: 1. No acute finding or change from October 2021. 2. Chronic hydronephrosis and urothelial thickening on the right with stable stent positioning. Multiple right renal and ureteral calculi. 3. Small left renal calculi. 4. History of prostate cancer with stable pelvic adenopathy. Electronically Signed   By: Monte Fantasia M.D.   On: 10/24/2020 04:53   DG Chest 1 View  Result Date: 10/24/2020 CLINICAL DATA:  Weakness. EXAM: CHEST  1 VIEW COMPARISON:  May 01, 2006 FINDINGS: There is no evidence of acute infiltrate, pleural effusion or pneumothorax. Mild suprahilar and infrahilar prominence of the pulmonary vasculature is seen. The cardiac silhouette is mildly enlarged. Degenerative changes seen throughout the thoracic spine. IMPRESSION: Mild cardiomegaly with mild pulmonary vascular congestion. Electronically Signed   By: Virgina Norfolk M.D.   On: 10/24/2020 00:12   DG Chest Port 1 View  Result Date: 10/24/2020 CLINICAL DATA:  Weakness. EXAM: PORTABLE CHEST 1 VIEW COMPARISON:  October 23, 2020. FINDINGS: Stable mild cardiomegaly is noted with probable central pulmonary vascular congestion. No pneumothorax or pleural effusion is noted. Both lungs are clear. The visualized skeletal structures are unremarkable. IMPRESSION: Stable mild cardiomegaly with probable central pulmonary vascular congestion. Electronically Signed   By: Marijo Conception M.D.   On: 10/24/2020 14:47     Assessment and Plan:   1. A fib with RVR with hx of PAF on anticoagulation. eliquis 5 mg BID monitor with increasing Cr.   Now on  dilt drip - rate still elevated will give another 10 mg bolus, BP is elevated, felt to be driven by acute illness.  Did miss one dose of  eliquis.  2. Chronic diastolic HF - No known CAD and stress test 01/24/2019 There is a medium sized region with moderate reduction in uptake in the  apical to mid inferior segment(s) that is predominantly reversible  consistent with ischemia.   EF 77%. And normal EF on echo in Sept, now with SOB due to rapid HR but has recently rec'd 60 mg IV lasix  He is + 2600 , no echo for now   3. Sepsis with urology consult and on ABX for UTI  4. AKI with increase of Cr. Per IM   5. HTN, losartan on hold continue coreg. 6. Chronic diastolic HF lymphedema . Has rec'd lasix 60 X 1, increased SOB with rapid a fib             CHA2DS2-VASc Score = 5  This indicates a 7.2% annual risk of stroke. The patient's score is based upon: CHF History: Yes HTN History: Yes Diabetes History: Yes Stroke History: No Vascular Disease History: No Age Score: 2 Gender Score: 0         For questions or updates, please contact [place] Please consult [email or web address] for contact info under    Signed, Cecilie Kicks, NP  10/24/2020 4:36 PM   Attending Note:   The patient was seen and examined.  Agree with assessment and plan as noted above.  Changes made to the above note as needed.  Patient seen and independently examined with Cecilie Kicks, NP.   We discussed all aspects of the encounter. I agree with the assessment and plan as stated above.  1.   Atrial fib:   With RVR.  Likely due to his underlying urosepsis.   Temp is 102.1 Wbc = 22K  Agree with Diltiazem drip for rate control Cont. eliquis  His is volume overloaded - it appears that he received IV fluids as part of his sepsis treatment.   I suspect he needs diuresis at this point   2.  Acute on chronic diastolic CHF:   Is very volume overloaded at this point. Also has leg lymphedema.  He is wheezing  Will start lasix 80 mg iv bid,  kdur 10 meq bid - titrate as needed.   3. HTN:  Cont meds  4.  Morbid obesity      I have spent a  total of 40 minutes with patient reviewing hospital  notes , telemetry, EKGs, labs and examining patient as well as establishing an assessment and plan that was discussed with the patient. > 50% of time was spent in direct patient care.    Thayer Headings, Brooke Bonito., MD, [facility] 10/24/2020, 5:08 PM [address]  [place] Pager [phone]

## 2020-10-24 NOTE — ED Notes (Signed)
Pt placed in hospital bed at this time. Tolerated well.

## 2020-10-24 NOTE — Progress Notes (Signed)
OT Cancellation Note  Patient Details Name: Terry [name] MRN: [id number] DOB: Apr 07, 1941   Cancelled Treatment:    Reason Eval/Treat Not Completed: Patient not medically ready (Pt with uncontrolled afib with RVR. HR is 127 bpm at rest. Will hold until more medically stable)  Zenovia Jarred, MSOT, OTR/L [facility] Office Number: [phone] Pager: [phone]  Zenovia Jarred 10/24/2020, 3:29 PM

## 2020-10-24 NOTE — Progress Notes (Signed)
Afib with RVR-appears comfortable. Will try IV Cardizem prn-if no improvement will place of infusion. Continue to monitor closely

## 2020-10-24 NOTE — Progress Notes (Signed)
Orthopedic Tech Progress Note Patient Details:  Terry [name] 1941/08/28 [id number]  Ortho Devices Type of Ortho Device: Haematologist Ortho Device/Splint Location: (B)LE Ortho Device/Splint Interventions: Ordered,Application   Post Interventions Patient Tolerated: Well Instructions Provided: Care of device   Braulio Bosch 10/24/2020, 10:38 AM

## 2020-10-24 NOTE — Progress Notes (Signed)
PT Cancellation Note  Patient Details Name: Terry [name] MRN: [id number] DOB: [birth year]   Cancelled Treatment:    Reason Eval/Treat Not Completed: Medical issues which prohibited therapy - Pt in afib with RVR, HR 127 bpm at rest. PT to hold, will check back when medically appropriate.   Stacie Glaze, PT Acute Rehabilitation Services Pager [phone]  Office [phone]    Louis Matte 10/24/2020, 3:28 PM

## 2020-10-24 NOTE — ED Notes (Signed)
Dinner Tray Ordered @ 1744. 

## 2020-10-25 ENCOUNTER — Encounter (HOSPITAL_COMMUNITY): Payer: Self-pay | Admitting: Family Medicine

## 2020-10-25 DIAGNOSIS — I480 Paroxysmal atrial fibrillation: Secondary | ICD-10-CM

## 2020-10-25 LAB — CBC
HCT: 33.6 % — ABNORMAL LOW (ref 39.0–52.0)
Hemoglobin: 10.3 g/dL — ABNORMAL LOW (ref 13.0–17.0)
MCH: 23.5 pg — ABNORMAL LOW (ref 26.0–34.0)
MCHC: 30.7 g/dL (ref 30.0–36.0)
MCV: 76.7 fL — ABNORMAL LOW (ref 80.0–100.0)
Platelets: 196 10*3/uL (ref 150–400)
RBC: 4.38 MIL/uL (ref 4.22–5.81)
RDW: 18.6 % — ABNORMAL HIGH (ref 11.5–15.5)
WBC: 21.7 10*3/uL — ABNORMAL HIGH (ref 4.0–10.5)
nRBC: 0 % (ref 0.0–0.2)

## 2020-10-25 LAB — BASIC METABOLIC PANEL
Anion gap: 10 (ref 5–15)
BUN: 26 mg/dL — ABNORMAL HIGH (ref 8–23)
CO2: 26 mmol/L (ref 22–32)
Calcium: 8.5 mg/dL — ABNORMAL LOW (ref 8.9–10.3)
Chloride: 101 mmol/L (ref 98–111)
Creatinine, Ser: 1.98 mg/dL — ABNORMAL HIGH (ref 0.61–1.24)
GFR, Estimated: 34 mL/min — ABNORMAL LOW (ref >60)
Glucose, Bld: 147 mg/dL — ABNORMAL HIGH (ref 70–99)
Potassium: 3.5 mmol/L (ref 3.5–5.1)
Sodium: 137 mmol/L (ref 135–145)

## 2020-10-25 LAB — GLUCOSE, CAPILLARY
Glucose-Capillary: 121 mg/dL — ABNORMAL HIGH (ref 70–99)
Glucose-Capillary: 120 mg/dL — ABNORMAL HIGH (ref 70–99)
Glucose-Capillary: 116 mg/dL — ABNORMAL HIGH (ref 70–99)
Glucose-Capillary: 125 mg/dL — ABNORMAL HIGH (ref 70–99)

## 2020-10-25 LAB — URINE CULTURE: Culture: <10000 — AB

## 2020-10-25 MED ORDER — INFLUENZA VAC A&B SA ADJ QUAD 0.5 ML IM PRSY
0.5000 mL | PREFILLED_SYRINGE | INTRAMUSCULAR | Status: AC
  Administered 2020-10-26: 0.5 mL via INTRAMUSCULAR
  Filled 2020-10-25: qty 0.5

## 2020-10-25 MED ORDER — BISACODYL 10 MG RE SUPP: 10.0000 mg | Freq: Every day | RECTAL | Status: DC | PRN

## 2020-10-25 MED ORDER — POLYETHYLENE GLYCOL 3350 17 G PO PACK
17.0000 g | PACK | Freq: Every day | ORAL | Status: DC
  Administered 2020-10-25 – 2020-10-26 (×2): 17 g via ORAL
  Filled 2020-10-25 (×2): qty 1

## 2020-10-25 MED ORDER — PNEUMOCOCCAL VAC POLYVALENT 25 MCG/0.5ML IJ INJ
0.5000 mL | INJECTION | INTRAMUSCULAR | Status: AC
  Administered 2020-10-26: 0.5 mL via INTRAMUSCULAR
  Filled 2020-10-25: qty 0.5

## 2020-10-25 NOTE — Evaluation (Signed)
Physical Therapy Evaluation Patient Details Name: Terry [name] MRN: [id number] DOB: [birth year] Today's Date: 10/25/2020   History of Present Illness  79 y.o male presenting with generalized weakness. Found to have sepsis with fever, tachycardia, tachypnea and elevated white blood cell count with urinalysis positive for UTI. PMH includes  paroxysmal A. Fib, HFpEF, HTN, CKD 3, DMT2 diet controlled, hypothyroidism, prostate cancer  Clinical Impression  Pt was seen for progression of gait on RW to cover a few steps and to get pt up for strengthening for his trunk and legs.  Pt is motivated to get stronger and wants to return to more independent status.  Will work on goals of acute PT to get his independent and safer motor skills recovered.  He is going to need to be up to get to BR more often as his continence is creating safety and hygiene concerns. Follow up with acute therapy goals of tx.     Follow Up Recommendations SNF    Equipment Recommendations  None recommended by PT    Recommendations for Other Services       Precautions / Restrictions Precautions Precautions: Fall Precaution Comments: urine incontinence Restrictions Weight Bearing Restrictions: No Other Position/Activity Restrictions: bil Unna boots      Mobility  Bed Mobility Overal bed mobility: Needs Assistance Bed Mobility: Supine to Sit     Supine to sit: Mod assist     General bed mobility comments: up in chair  when PT arrived    Transfers Overall transfer level: Needs assistance Equipment used: Rolling walker (2 wheeled) Transfers: Sit to/from Stand Sit to Stand: Mod assist;Max assist Stand pivot transfers: Mod assist       General transfer comment: 100% cues for all hand placement  Ambulation/Gait Ambulation/Gait assistance: Min assist Gait Distance (Feet): 10 Feet (5 x 2) Assistive device: Rolling walker (2 wheeled);1 person hand held assist Gait Pattern/deviations: Step-through pattern;Wide  base of support;Trunk flexed;Decreased stride length Gait velocity: reduced Gait velocity interpretation: <1.31 ft/sec, indicative of household ambulator General Gait Details: wide slow steps with cues for direction needed to set up to sit on side of bed  Stairs            Wheelchair Mobility    Modified Rankin (Stroke Patients Only)       Balance Overall balance assessment: Needs assistance Sitting-balance support: Feet supported Sitting balance-Leahy Scale: Fair     Standing balance support: Bilateral upper extremity supported;During functional activity Standing balance-Leahy Scale: Poor Standing balance comment: reliant on external support of max A to rise into standing + RW                             Pertinent Vitals/Pain Pain Assessment: No/denies pain    Home Living Family/patient expects to be discharged to:: Private residence Living Arrangements: Children Available Help at Discharge: Family;Available PRN/intermittently Type of Home: House Home Access: Level entry     Home Layout: Two level;Bed/bath upstairs;1/2 bath on main level Home Equipment: Clinical cytogeneticist - 2 wheels;Cane - single point Additional Comments: has been sleeping downstairs but has bedroom and bath upstairs    Prior Function Level of Independence: Independent with assistive device(s)         Comments: reports he can walk alone but is not getting up the stairs     Hand Dominance   Dominant Hand: Right    Extremity/Trunk Assessment   Upper Extremity Assessment Upper Extremity Assessment: Defer to OT  evaluation    Lower Extremity Assessment Lower Extremity Assessment: Generalized weakness    Cervical / Trunk Assessment Cervical / Trunk Assessment: Normal  Communication   Communication: No difficulties  Cognition Arousal/Alertness: Awake/alert Behavior During Therapy: WFL for tasks assessed/performed Overall Cognitive Status: No family/caregiver present to  determine baseline cognitive functioning Area of Impairment: Safety/judgement;Memory                     Memory: Decreased short-term memory   Safety/Judgement: Decreased awareness of safety;Decreased awareness of deficits   Problem Solving: Slow processing;Requires verbal cues;Requires tactile cues General Comments: self awareness is low, sitting in chair in urine and not aware, voiding on himself when attempting to use urinal      General Comments General comments (skin integrity, edema, etc.): pt is  more controlled for posture with RW, requiring cues for motor planning and safety    Exercises     Assessment/Plan    PT Assessment Patient needs continued PT services  PT Problem List Decreased strength;Decreased activity tolerance;Decreased balance;Decreased mobility;Decreased coordination;Decreased cognition;Decreased safety awareness;Decreased knowledge of use of DME;Obesity       PT Treatment Interventions DME instruction;Gait training;Stair training;Functional mobility training;Therapeutic activities;Balance training;Neuromuscular re-education;Therapeutic exercise;Patient/family education    PT Goals (Current goals can be found in the Care Plan section)  Acute Rehab PT Goals Patient Stated Goal: return home PT Goal Formulation: With patient Time For Goal Achievement: 11/08/20 Potential to Achieve Goals: Good    Frequency Min 3X/week   Barriers to discharge Inaccessible home environment;Decreased caregiver support has a full flight to upstairs bath    Co-evaluation               AM-PAC PT "6 Clicks" Mobility  Outcome Measure Help needed turning from your back to your side while in a flat bed without using bedrails?: A Little Help needed moving from lying on your back to sitting on the side of a flat bed without using bedrails?: A Little Help needed moving to and from a bed to a chair (including a wheelchair)?: A Little Help needed standing up from a  chair using your arms (e.g., wheelchair or bedside chair)?: A Lot Help needed to walk in hospital room?: A Little Help needed climbing 3-5 steps with a railing? : A Lot 6 Click Score: 16    End of Session Equipment Utilized During Treatment: Gait belt Activity Tolerance: Patient limited by fatigue;Treatment limited secondary to medical complications (Comment) Patient left: in chair;with call bell/phone within reach;with chair alarm set Nurse Communication: Mobility status;Other (comment) (urine incontinence) PT Visit Diagnosis: Unsteadiness on feet (R26.81);Muscle weakness (generalized) (M62.81);Difficulty in walking, not elsewhere classified (R26.2)    Time: 0973-5329 PT Time Calculation (min) (ACUTE ONLY): 33 min   Charges:   PT Evaluation $PT Eval Moderate Complexity: 1 Mod PT Treatments $Gait Training: 8-22 mins       Ramond Dial 10/25/2020, 1:56 PM  Mee Hives, PT MS Acute Rehab Dept. Number: [place] and [place]

## 2020-10-25 NOTE — Progress Notes (Addendum)
Progress Note  Patient Name: Nicoles Sedlacek Date of Encounter: 10/25/2020  [facility] Cardiologist: Ida Rogue, MD   Subjective   Denies any CP or SOB.   Inpatient Medications    Scheduled Meds:  allopurinol  300 mg Oral Daily   apixaban  5 mg Oral BID   carvedilol  25 mg Oral BID WC   furosemide  80 mg Intravenous BID   [START ON 10/26/2020] influenza vaccine adjuvanted  0.5 mL Intramuscular Tomorrow-1000   levothyroxine  25 mcg Oral Q0600   [START ON 10/26/2020] pneumococcal 23 valent vaccine  0.5 mL Intramuscular Tomorrow-1000   potassium chloride  10 mEq Oral BID   tamsulosin  0.4 mg Oral Daily   Continuous Infusions:  cefTRIAXone (ROCEPHIN)  IV 2 g (10/25/20 0939)   diltiazem (CARDIZEM) infusion 15 mg/hr (10/24/20 1646)   PRN Meds: acetaminophen **OR** acetaminophen, diltiazem, senna-docusate   Vital Signs    Vitals:   10/25/20 0256 10/25/20 0319 10/25/20 0541 10/25/20 0926  BP: 126/62 133/63 121/65   Pulse: 98 77 73   Resp: (!) 25 (!) 21 (!) 25   Temp: 98.4 F (36.9 C)  99 F (37.2 C) 98.3 F (36.8 C)  TempSrc: Oral  Oral Oral  SpO2: 98% 99% 96%   Weight:      Height:        Intake/Output Summary (Last 24 hours) at 10/25/2020 1010 Last data filed at 10/25/2020 0220 Gross per 24 hour  Intake 1000 ml  Output 4550 ml  Net -3550 ml   Last 3 Weights 10/23/2020 10/20/2020 09/12/2020  Weight (lbs) 310 lb 312 lb 303 lb  Weight (kg) 140.615 kg 141.522 kg 137.44 kg      Telemetry    NSR overnight, no afib - Personally Reviewed  ECG    Atrial fibrillation with RVR - Personally Reviewed  Physical Exam   GEN: No acute distress.   Neck: No JVD Cardiac: RRR, no murmurs, rubs, or gallops.  Respiratory: Clear to auscultation bilaterally. GI: Soft, nontender, non-distended  MS: No edema; No deformity. Neuro:  Nonfocal  Psych: Normal affect   Labs    High Sensitivity Troponin:  No results for input(s): TROPONINIHS in the last 720 hours.     Chemistry Recent Labs  Lab 10/23/20 2201 10/24/20 0751 10/25/20 0613  NA 140 139 137  K 3.7 4.5 3.5  CL 105 104 101  CO2 24 24 26   GLUCOSE 92 88 147*  BUN 20 20 26*  CREATININE 1.70* 1.84* 1.98*  CALCIUM 9.1 8.7* 8.5*  PROT 7.3 7.3  --   ALBUMIN 3.1* 2.8*  --   AST 22 27  --   ALT 14 13  --   ALKPHOS 53 50  --   BILITOT 0.8 1.0  --   GFRNONAA 41* 37* 34*  ANIONGAP 11 11 10      Hematology Recent Labs  Lab 10/23/20 2201 10/24/20 0751 10/25/20 0613  WBC 14.9* 22.4* 21.7*  RBC 4.93 5.34 4.38  HGB 11.9* 12.6* 10.3*  HCT 38.5* 43.6 33.6*  MCV 78.1* 81.6 76.7*  MCH 24.1* 23.6* 23.5*  MCHC 30.9 28.9* 30.7  RDW 18.9* 19.4* 18.6*  PLT 221 156 196    BNPNo results for input(s): BNP, PROBNP in the last 168 hours.   DDimer No results for input(s): DDIMER in the last 168 hours.   Radiology    CT ABDOMEN PELVIS WO CONTRAST  Result Date: 10/24/2020 CLINICAL DATA:  Acute nonlocalized abdominal pain. EXAM: CT  ABDOMEN AND PELVIS WITHOUT CONTRAST TECHNIQUE: Multidetector CT imaging of the abdomen and pelvis was performed following the standard protocol without IV contrast. COMPARISON:  08/21/2020 FINDINGS: Lower chest:  No contributory findings. Hepatobiliary: No focal liver abnormality.No evidence of biliary obstruction or stone. Pancreas: Unremarkable. Spleen: Unremarkable. Adrenals/Urinary Tract: Negative adrenals. Stented right kidney with severe scarring and multifocal lower pole moiety calculi. There are superimposed renal cysts. The intrarenal collecting system is similarly patulous to prior. A stone is seen in the upper ureter adjacent to the stent, 7 mm in diameter. Small left upper and lower pole renal calculi. Unremarkable bladder. Stomach/Bowel: No obstruction. No appendicitis. Bowel anastomosis in the left abdomen. Vascular/Lymphatic: No acute vascular abnormality. Stable mild enlargement of pelvic lymph nodes measuring up to 14 mm short axis at the right node of  [place] Reproductive:Symmetric enlargement of the prostate. There is a reported history of prostate cancer on prior imaging. Other: No ascites or pneumoperitoneum. Musculoskeletal: No acute abnormalities. Scar-like appearance in the right gluteal region. There are a few sclerotic areas most notably seen in the L5 left body. Bone scan was performed October 2021. 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 degenerative disease. IMPRESSION: 1. No acute finding or change from October 2021. 2. Chronic hydronephrosis and urothelial thickening on the right with stable stent positioning. Multiple right renal and ureteral calculi. 3. Small left renal calculi. 4. History of prostate cancer with stable pelvic adenopathy. Electronically Signed   By: Monte Fantasia M.D.   On: 10/24/2020 04:53   DG Chest 1 View  Result Date: 10/24/2020 CLINICAL DATA:  Weakness. EXAM: CHEST  1 VIEW COMPARISON:  May 01, 2006 FINDINGS: There is no evidence of acute infiltrate, pleural effusion or pneumothorax. Mild suprahilar and infrahilar prominence of the pulmonary vasculature is seen. The cardiac silhouette is mildly enlarged. Degenerative changes seen throughout the thoracic spine. IMPRESSION: Mild cardiomegaly with mild pulmonary vascular congestion. Electronically Signed   By: Virgina Norfolk M.D.   On: 10/24/2020 00:12   DG Chest Port 1 View  Result Date: 10/24/2020 CLINICAL DATA:  Weakness. EXAM: PORTABLE CHEST 1 VIEW COMPARISON:  October 23, 2020. FINDINGS: Stable mild cardiomegaly is noted with probable central pulmonary vascular congestion. No pneumothorax or pleural effusion is noted. Both lungs are clear. The visualized skeletal structures are unremarkable. IMPRESSION: Stable mild cardiomegaly with probable central pulmonary vascular congestion. Electronically Signed   By: Marijo Conception M.D.   On: 10/24/2020 14:47    Cardiac Studies   Echo 07/29/2020  1. Left ventricular ejection fraction, by  estimation, is 60 to 65%. The  left ventricle has normal function. The left ventricle has no regional  wall motion abnormalities. There is mild left ventricular hypertrophy.  Left ventricular diastolic parameters  are consistent with Grade II diastolic dysfunction (pseudonormalization).   2. Right ventricular systolic function is normal. The right ventricular  size is mildly enlarged.   3. Left atrial size was mild to moderately dilated.   4. Right atrial size was mildly dilated.   5. The mitral valve is normal in structure. No evidence of mitral valve  regurgitation. No evidence of mitral stenosis.   6. Tricuspid valve regurgitation is mild to moderate.   7. The aortic valve was not well visualized. Aortic valve regurgitation  is mild. Mild aortic valve stenosis.   Patient Profile     79 y.o. male with PMH of PAF, CHF, morbid obesity, HTN, lower extremity edema, CKD, stage III, and colon cancer with generalized weakness presented  with UTI and sepsis. He was in sinus rhythm on arrival however went into atrial fibrillation and was treated with rate control therapy, he converted overnight  Assessment & Plan    1. Atrial fibrillation with RVR  - in the setting of urosepsis  - started on Eliquis, reassess as outpatient the duration of NOAC  - CHA2DS2-Vasc score 5 (CHF, HTN, DM II, age>75)  - converted on rate control therapy, consider switch his 10mg /hr diltiazem to 240mg  daily of Diltiazem CD on top of Coreg 25mg  BID  2. Acute on chronic diastolic CHF  - continue to have 2-3+ pitting edema on exam, note, patient has been having edema for more than a year  - continue IV diuretic 80mg  BID, Cr trended up slightly, will follow closely  3. HTN  4. Morbid obesity  5. Urosepsis  6. AKI: 1.8, slightly up  7. Metastatic prostate CA      For questions or updates, please contact [place] Please consult [email or web address] for contact info under        Signed, Almyra Deforest, [place]   10/25/2020, 10:10 AM    No complaints Converted to NSR. Still with LE edema chronic component of lymph edema continue iv bid lasix suspect prostate cancer has caused issues with pelvic lymph nodes Ok to make slightly azotemic to Rx swelling   Jenkins Rouge MD [facility]

## 2020-10-25 NOTE — Progress Notes (Signed)
PROGRESS NOTE        PATIENT DETAILS Name: Terry [name] Age: 79 y.o. Sex: male Date of Birth: 1941-02-16 Admit Date: 10/23/2020 Admitting Physician Eben Burow, MD QIO:[id number], [name], MD  Brief Narrative: Patient is a 79 y.o. male PAF on anticoagulation, chronic diastolic heart failure, HTN, CKD stage IIIb, DM-2, prostate cancer, hypothyroidism, chronic bilateral lower extremity lymphedema-who presented with fever and generalized weakness-found to have complicated UTI, AKI-started on IV antibiotics and admitted to the hospitalist service.  See below for further details  Significant events: 12/9>> admit to [facility] for fever/weakness-complicated UTI, AKI 41/32>> A. fib with RVR  Significant studies: 12/10>> CT abdomen/pelvis: Chronic hydronephrosis/urothelial thickening of the right-stable stent.  History of prostate cancer with stable pelvic adenopathy 12/10>> chest x-ray: No pneumonia  Antimicrobial therapy: Vancomycin: 12/9 x 1  Flagyl: 12/9 >> 12/10  Cefepime: 12/9 x 1  Ceftriaxone: 12/10 >>  Microbiology data: 12/9>> blood culture: Pending 12/10>> urine culture: Pending  Procedures : None  Consults: Cardiology  DVT Prophylaxis : apixaban (ELIQUIS) tablet 5 mg    Subjective: Awake/alert-afebrile overnight.  Denies any chest pain or shortness of breath.  Assessment/Plan: Sepsis due to complicated UTI: Sepsis physiology has improved-febrile yesterday but afebrile overnight-continues to have leukocytosis but slowly downtrending-continue Rocephin-await culture data.  Follow clinical course.  C  AKI on CKD stage IIIb: AKI likely hemodynamically mediated-in the setting of sepsis/losartan/Lasix use-gently hydrated on initial admission-now volume overloaded-and started on Lasix.  Mild bump in creatinine-follow closely.  PAF with RVR: RVR provoked by acute infection-required Cardizem infusion for rate control-back in sinus rhythm today.   Remains on Cardizem-cardiology following-we will await further recommendations.  HTN: Hold losartan-continue Coreg-follow and optimize.  Acute on chronic diastolic heart failure/bilateral lower extremity lymphedema: Has chronic edema at baseline-some mild worsening of volume status likely due to IV fluid resuscitation for sepsis.  Continue IV Lasix-watch renal function-compressive wraps in place for significant amount of lymphedema.   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  Metastatic prostate cancer: Resume outpatient follow-up with oncology/urology  BPH: Continue Flomax  Hypothyroidism: Continue Synthroid  Gout: Continue allopurinol  Morbid Obesity: Estimated body mass index is 48.55 kg/m as calculated from the following:   Height as of this encounter: 5\' 7"  (1.702 m).   Weight as of this encounter: 140.6 kg.    Diet: Diet Order            Diet Heart Room service appropriate? Yes; Fluid consistency: Thin  Diet effective now                  Code Status: Full code   Family Communication: Spoke with (daughter-Lisa (is a RN)-[phone]) over phone on 12/11  Disposition Plan: Status is: Inpatient  Remains inpatient appropriate because:Inpatient level of care appropriate due to severity of illness   Dispo: The patient is from: Home              Anticipated d/c is to: Home              Anticipated d/c date is: 3 days              Patient currently is not medically stable to d/c.   Barriers to Discharge: Sepsis  physiology due to UTI with AKI-requiring IV antibiotics-not yet medically stable for discharge  Antimicrobial agents: Anti-infectives (From admission, onward)   Start     Dose/Rate Route Frequency Ordered Stop   10/25/20 0800  cefTRIAXone (ROCEPHIN) 2 g in sodium chloride 0.9 % 100  mL IVPB        2 g 200 mL/hr over 30 Minutes Intravenous Every 24 hours 10/24/20 1514     10/24/20 2200  vancomycin (VANCOREADY) IVPB 1750 mg/350 mL  Status:  Discontinued        1,750 mg 175 mL/hr over 120 Minutes Intravenous Every 24 hours 10/24/20 0107 10/24/20 0741   10/24/20 1000  ceFEPIme (MAXIPIME) 2 g in sodium chloride 0.9 % 100 mL IVPB  Status:  Discontinued        2 g 200 mL/hr over 30 Minutes Intravenous Every 12 hours 10/24/20 0107 10/24/20 0705   10/24/20 0715  cefTRIAXone (ROCEPHIN) 1 g in sodium chloride 0.9 % 100 mL IVPB  Status:  Discontinued        1 g 200 mL/hr over 30 Minutes Intravenous Every 24 hours 10/24/20 0705 10/24/20 1514   10/23/20 2300  vancomycin (VANCOCIN) 2,500 mg in sodium chloride 0.9 % 500 mL IVPB        2,500 mg 250 mL/hr over 120 Minutes Intravenous  Once 10/23/20 2203 10/24/20 0245   10/23/20 2215  ceFEPIme (MAXIPIME) 2 g in sodium chloride 0.9 % 100 mL IVPB        2 g 200 mL/hr over 30 Minutes Intravenous  Once 10/23/20 2201 10/23/20 2353   10/23/20 2215  vancomycin (VANCOCIN) IVPB 1000 mg/200 mL premix  Status:  Discontinued        1,000 mg 200 mL/hr over 60 Minutes Intravenous  Once 10/23/20 2201 10/23/20 2203   10/23/20 2215  metroNIDAZOLE (FLAGYL) tablet 500 mg  Status:  Discontinued        500 mg Oral Every 8 hours 10/23/20 2201 10/24/20 0742       Time spent: 35 minutes-Greater than 50% of this time was spent in counseling, explanation of diagnosis, planning of further management, and coordination of care.  MEDICATIONS: Scheduled Meds: . allopurinol  300 mg Oral Daily  . apixaban  5 mg Oral BID  . carvedilol  25 mg Oral BID WC  . furosemide  80 mg Intravenous BID  . [START ON 10/26/2020] influenza vaccine adjuvanted  0.5 mL Intramuscular Tomorrow-1000  . levothyroxine  25 mcg Oral Q0600  . [START ON 10/26/2020] pneumococcal 23 valent vaccine  0.5 mL Intramuscular Tomorrow-1000  . potassium chloride  10 mEq Oral BID  . tamsulosin   0.4 mg Oral Daily   Continuous Infusions: . cefTRIAXone (ROCEPHIN)  IV    . diltiazem (CARDIZEM) infusion 15 mg/hr (10/24/20 1646)   PRN Meds:.acetaminophen **OR** acetaminophen, diltiazem, senna-docusate   PHYSICAL EXAM: Vital signs: Vitals:   10/25/20 0256 10/25/20 0319 10/25/20 0541 10/25/20 0926  BP: 126/62 133/63 121/65   Pulse: 98 77 73   Resp: (!) 25 (!) 21 (!) 25   Temp: 98.4 F (36.9 C)  99 F (37.2 C) 98.3 F (36.8 C)  TempSrc: Oral  Oral Oral  SpO2: 98% 99% 96%   Weight:      Height:       Filed Weights   10/23/20 2154  Weight: (!) 140.6 kg   Body mass index is 48.55 kg/m.   Gen Exam:Alert awake-not in any distress HEENT:atraumatic, normocephalic Chest: B/L clear to auscultation anteriorly  CVS:S1S2 regular Abdomen:soft non tender, non distended Extremities:+++ edema Neurology: Non focal Skin: no rash  I have personally reviewed following labs and imaging studies  LABORATORY DATA: CBC: Recent Labs  Lab 10/23/20 2201 10/24/20 0751 10/25/20 0613  WBC 14.9* 22.4* 21.7*  NEUTROABS 13.7*  --   --   HGB 11.9* 12.6* 10.3*  HCT 38.5* 43.6 33.6*  MCV 78.1* 81.6 76.7*  PLT 221 156 353    Basic Metabolic Panel: Recent Labs  Lab 10/23/20 2201 10/24/20 0751 10/25/20 0613  NA 140 139 137  K 3.7 4.5 3.5  CL 105 104 101  CO2 24 24 26   GLUCOSE 92 88 147*  BUN 20 20 26*  CREATININE 1.70* 1.84* 1.98*  CALCIUM 9.1 8.7* 8.5*    GFR: Estimated Creatinine Clearance: 41 mL/min (A) (by C-G formula based on SCr of 1.98 mg/dL (H)).  Liver Function Tests: Recent Labs  Lab 10/23/20 2201 10/24/20 0751  AST 22 27  ALT 14 13  ALKPHOS 53 50  BILITOT 0.8 1.0  PROT 7.3 7.3  ALBUMIN 3.1* 2.8*   No results for input(s): LIPASE, AMYLASE in the last 168 hours. No results for input(s): AMMONIA in the last 168 hours.  Coagulation Profile: Recent Labs  Lab 10/23/20 2201  INR 1.3*    Cardiac Enzymes: No results for input(s): CKTOTAL, CKMB, CKMBINDEX,  TROPONINI in the last 168 hours.  BNP (last 3 results) No results for input(s): PROBNP in the last 8760 hours.  Lipid Profile: No results for input(s): CHOL, HDL, LDLCALC, TRIG, CHOLHDL, LDLDIRECT in the last 72 hours.  Thyroid Function Tests: No results for input(s): TSH, T4TOTAL, FREET4, T3FREE, THYROIDAB in the last 72 hours.  Anemia Panel: No results for input(s): VITAMINB12, FOLATE, FERRITIN, TIBC, IRON, RETICCTPCT in the last 72 hours.  Urine analysis:    Component Value Date/Time   COLORURINE AMBER (A) 10/24/2020 0423   APPEARANCEUR CLOUDY (A) 10/24/2020 0423   APPEARANCEUR Cloudy (A) 08/21/2020 1530   LABSPEC 1.015 10/24/2020 0423   PHURINE 6.0 10/24/2020 0423   GLUCOSEU NEGATIVE 10/24/2020 0423   HGBUR LARGE (A) 10/24/2020 0423   BILIRUBINUR NEGATIVE 10/24/2020 0423   BILIRUBINUR Negative 08/21/2020 1530   KETONESUR NEGATIVE 10/24/2020 0423   PROTEINUR 100 (A) 10/24/2020 0423   NITRITE NEGATIVE 10/24/2020 0423   LEUKOCYTESUR LARGE (A) 10/24/2020 0423    Sepsis Labs: Lactic Acid, Venous    Component Value Date/Time   LATICACIDVEN 1.8 10/24/2020 0130    MICROBIOLOGY: Recent Results (from the past 240 hour(s))  Resp Panel by RT-PCR (Flu A&B, Covid) Nasopharyngeal Swab     Status: None   Collection Time: 10/23/20 10:58 PM   Specimen: Nasopharyngeal Swab; Nasopharyngeal(NP) swabs in vial transport medium  Result Value Ref Range Status   SARS Coronavirus 2 by RT PCR NEGATIVE NEGATIVE Final    Comment: (NOTE) SARS-CoV-2 target nucleic acids are NOT DETECTED.  The SARS-CoV-2 RNA is generally detectable in upper respiratory specimens during the acute phase of infection. The lowest concentration of SARS-CoV-2 viral copies this assay can detect is 138 copies/mL. A negative result does not preclude SARS-Cov-2 infection and should not be used as the sole basis for treatment or other patient management decisions. A negative result may occur with  improper specimen  collection/handling, submission of specimen other than nasopharyngeal swab, presence of viral mutation(s) within the areas targeted by this assay, and inadequate number of viral copies(<138 copies/mL). A negative result must be combined with clinical observations, patient history, and epidemiological information.  The expected result is Negative.  Fact Sheet for Patients:  EntrepreneurPulse.com.au  Fact Sheet for Healthcare Providers:  IncredibleEmployment.be  This test is no t yet approved or cleared by the [place] FDA and  has been authorized for detection and/or diagnosis of SARS-CoV-2 by FDA under an Emergency Use Authorization (EUA). This EUA will remain  in effect (meaning this test can be used) for the duration of the COVID-19 declaration under Section 564(b)(1) of the Act, 21 U.S.C.section 360bbb-3(b)(1), unless the authorization is terminated  or revoked sooner.       Influenza A by PCR NEGATIVE NEGATIVE Final   Influenza B by PCR NEGATIVE NEGATIVE Final    Comment: (NOTE) The Xpert Xpress SARS-CoV-2/FLU/RSV plus assay is intended as an aid in the diagnosis of influenza from Nasopharyngeal swab specimens and should not be used as a sole basis for treatment. Nasal washings and aspirates are unacceptable for Xpert Xpress SARS-CoV-2/FLU/RSV testing.  Fact Sheet for Patients: EntrepreneurPulse.com.au  Fact Sheet for Healthcare Providers: IncredibleEmployment.be  This test is not yet approved or cleared by the [place] FDA and has been authorized for detection and/or diagnosis of SARS-CoV-2 by FDA under an Emergency Use Authorization (EUA). This EUA will remain in effect (meaning this test can be used) for the duration of the COVID-19 declaration under Section 564(b)(1) of the Act, 21 U.S.C. section 360bbb-3(b)(1), unless the authorization is terminated or revoked.  Performed at [facility], [address], [place], [place] [address]     RADIOLOGY STUDIES/RESULTS: CT ABDOMEN PELVIS WO CONTRAST  Result Date: 10/24/2020 CLINICAL DATA:  Acute nonlocalized abdominal pain. EXAM: CT ABDOMEN AND PELVIS WITHOUT CONTRAST TECHNIQUE: Multidetector CT imaging of the abdomen and pelvis was performed following the standard protocol without IV contrast. COMPARISON:  08/21/2020 FINDINGS: Lower chest:  No contributory findings. Hepatobiliary: No focal liver abnormality.No evidence of biliary obstruction or stone. Pancreas: Unremarkable. Spleen: Unremarkable. Adrenals/Urinary Tract: Negative adrenals. Stented right kidney with severe scarring and multifocal lower pole moiety calculi. There are superimposed renal cysts. The intrarenal collecting system is similarly patulous to prior. A stone is seen in the upper ureter adjacent to the stent, 7 mm in diameter. Small left upper and lower pole renal calculi. Unremarkable bladder. Stomach/Bowel: No obstruction. No appendicitis. Bowel anastomosis in the left abdomen. Vascular/Lymphatic: No acute vascular abnormality. Stable mild enlargement of pelvic lymph nodes measuring up to 14 mm short axis at the right node of [place] Reproductive:Symmetric enlargement of the prostate. There is a reported history of prostate cancer on prior imaging. Other: No ascites or pneumoperitoneum. Musculoskeletal: No acute abnormalities. Scar-like appearance in the right gluteal region. There are a few sclerotic areas most notably seen in the L5 left body. Bone scan was performed October 2021. 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 degenerative disease. IMPRESSION: 1. No acute finding or change from October 2021. 2. Chronic hydronephrosis and urothelial thickening on the right with stable stent positioning. Multiple right renal and ureteral calculi. 3. Small left renal calculi. 4. History of prostate cancer with stable pelvic adenopathy. Electronically  Signed   By: Monte Fantasia M.D.   On: 10/24/2020 04:53   DG Chest 1 View  Result Date: 10/24/2020 CLINICAL DATA:  Weakness. EXAM: CHEST  1 VIEW COMPARISON:  May 01, 2006 FINDINGS: There is no evidence of acute infiltrate, pleural effusion or pneumothorax. Mild suprahilar and infrahilar prominence of the pulmonary vasculature is seen. The cardiac silhouette is mildly enlarged. Degenerative changes seen throughout the thoracic spine.  IMPRESSION: Mild cardiomegaly with mild pulmonary vascular congestion. Electronically Signed   By: Virgina Norfolk M.D.   On: 10/24/2020 00:12   DG Chest Port 1 View  Result Date: 10/24/2020 CLINICAL DATA:  Weakness. EXAM: PORTABLE CHEST 1 VIEW COMPARISON:  October 23, 2020. FINDINGS: Stable mild cardiomegaly is noted with probable central pulmonary vascular congestion. No pneumothorax or pleural effusion is noted. Both lungs are clear. The visualized skeletal structures are unremarkable. IMPRESSION: Stable mild cardiomegaly with probable central pulmonary vascular congestion. Electronically Signed   By: Marijo Conception M.D.   On: 10/24/2020 14:47     LOS: 1 day   Oren Binet, MD  [facility]    To contact the attending provider between 7A-7P or the covering provider during after hours 7P-7A, please log into the web site [email or web address] and access using universal [place] password for that web site. If you do not have the password, please call the hospital operator.  10/25/2020, 9:33 AM

## 2020-10-25 NOTE — Plan of Care (Signed)
  Problem: Education: Goal: Knowledge of General Education information will improve Description: Including pain rating scale, medication(s)/side effects and non-pharmacologic comfort measures Outcome: Progressing   Problem: Health Behavior/Discharge Planning: Goal: Ability to manage health-related needs will improve Outcome: Progressing   Problem: Clinical Measurements: Goal: Ability to maintain clinical measurements within normal limits will improve Outcome: Progressing Goal: Will remain free from infection Outcome: Progressing Goal: Diagnostic test results will improve Outcome: Progressing Goal: Respiratory complications will improve Outcome: Progressing Goal: Cardiovascular complication will be avoided Outcome: Progressing   Problem: Coping: Goal: Level of anxiety will decrease Outcome: Progressing   Problem: Pain Managment: Goal: General experience of comfort will improve Outcome: Progressing   Problem: Safety: Goal: Ability to remain free from injury will improve Outcome: Progressing   Problem: Skin Integrity: Goal: Risk for impaired skin integrity will decrease Outcome: Progressing   Problem: Education: Goal: Ability to demonstrate management of disease process will improve Outcome: Progressing Goal: Ability to verbalize understanding of medication therapies will improve Outcome: Progressing Goal: Individualized Educational Video(s) Outcome: Progressing   Problem: Activity: Goal: Capacity to carry out activities will improve Outcome: Progressing   Problem: Cardiac: Goal: Ability to achieve and maintain adequate cardiopulmonary perfusion will improve Outcome: Progressing

## 2020-10-25 NOTE — Evaluation (Signed)
Occupational Therapy Evaluation Patient Details Name: Terry [name] MRN: [id number] DOB: January 26, 1941 Today's Date: 10/25/2020    History of Present Illness 79 y.o male presenting with generalized weakness. Found to have sepsis with fever, tachycardia, tachypnea and elevated white blood cell count with urinalysis positive for UTI. PMH includes  paroxysmal A. Fib, HFpEF, HTN, CKD 3, DMT2 diet controlled, hypothyroidism, prostate cancer   Clinical Impression   PTA pt living with children and functioning at mod I level for ADLs. He reports his daughter assists with IADLs. He also reports he lives in a two story home, where his bed and full bath are upstairs. At time of eval, pt able to complete bed mobility at mod A level and sit <> stand transfers with max A and RW. Pt required increased assist to power up and steady with RW, but then was able to pivot to recliner chair with mod A. Pt bed was notably saturated with urine and he had not alerted staff. Noted cognitive deficits in safety awareness and problem solving. Given current status, recommend pt d/c to SNF for continued ADL progression prior to d/c home. Will continue to follow per POC listed below.    Follow Up Recommendations  SNF;Supervision/Assistance - 24 hour    Equipment Recommendations  3 in 1 bedside commode;Wheelchair (measurements OT);Wheelchair cushion (measurements OT) (bariatric)    Recommendations for Other Services       Precautions / Restrictions Precautions Precautions: Fall Restrictions Weight Bearing Restrictions: No Other Position/Activity Restrictions: bil Unna boots      Mobility Bed Mobility Overal bed mobility: Needs Assistance Bed Mobility: Supine to Sit     Supine to sit: Mod assist     General bed mobility comments: step by step cueing needed to sequence transfer. Assist needed at trunk to come up into sitting    Transfers Overall transfer level: Needs assistance Equipment used: Rolling walker  (2 wheeled) Transfers: Sit to/from [facility] Sit to Stand: Max assist Stand pivot transfers: Mod assist       General transfer comment: mas A to power up into standing with RW; mod A to pivot over to recliner chair. Cues needed for safe RW usage and navigation    Balance Overall balance assessment: Needs assistance Sitting-balance support: Feet supported Sitting balance-Leahy Scale: Fair     Standing balance support: Bilateral upper extremity supported;During functional activity Standing balance-Leahy Scale: Zero Standing balance comment: reliant on external support of max A to rise into standing + RW                           ADL either performed or assessed with clinical judgement   ADL Overall ADL's : Needs assistance/impaired Eating/Feeding: Set up;Sitting   Grooming: Set up;Sitting   Upper Body Bathing: Minimal assistance;Sitting   Lower Body Bathing: Maximal assistance;Sitting/lateral leans;Sit to/from stand   Upper Body Dressing : Minimal assistance;Sitting   Lower Body Dressing: Maximal assistance;Sit to/from stand   Toilet Transfer: Maximal assistance;Stand-pivot;BSC;RW Toilet Transfer Details (indicate cue type and reason): simualted to recliner. Pt required max A to power up into standing with RW Toileting- Clothing Manipulation and Hygiene: Total assistance Toileting - Clothing Manipulation Details (indicate cue type and reason): incontinent. Laying in bed saturated with urine and did not inform staff     Functional mobility during ADLs: Maximal assistance;Rolling walker (sit <> stand)       Vision Patient Visual Report: No change from baseline  Perception     Praxis      Pertinent Vitals/Pain Pain Assessment: No/denies pain     Hand Dominance     Extremity/Trunk Assessment Upper Extremity Assessment Upper Extremity Assessment: Generalized weakness   Lower Extremity Assessment Lower Extremity Assessment:  Generalized weakness       Communication Communication Communication: No difficulties   Cognition Arousal/Alertness: Awake/alert Behavior During Therapy: WFL for tasks assessed/performed Overall Cognitive Status: No family/caregiver present to determine baseline cognitive functioning Area of Impairment: Memory;Safety/judgement;Problem solving                     Memory: Decreased short-term memory   Safety/Judgement: Decreased awareness of safety;Decreased awareness of deficits   Problem Solving: Slow processing;Requires verbal cues General Comments: noted slow processing without, especially with recalling home set up info. Overall decreased awareness of current deficits. Was laying in bed saturated with urine and did not inform staff   General Comments       Exercises     Shoulder Instructions      Home Living Family/patient expects to be discharged to:: Private residence Living Arrangements: Children (son and daughter are "in and out") Available Help at Discharge: Family;Available PRN/intermittently Type of Home: House Home Access: Level entry     Home Layout: Two level;Bed/bath upstairs;1/2 bath on main level Alternate Level Stairs-Number of Steps: full flight   Bathroom Shower/Tub: Teacher, early years/pre: Standard     Home Equipment: Clinical cytogeneticist - 2 wheels;Cane - single point   Additional Comments: Pt full bath and bedroom is upstairs. has been staying in recliner on main level.      Prior Functioning/Environment Level of Independence: Independent with assistive device(s)        Comments: pt is questionable historian. Uses RW vs cane for mobility. Reports on a normal day he can do ADLs at mod I level (increased time and RW vs cane). Lately he has not been able to get up the stairs to his bedroom and bathroom and has been staying on the main level with a 1/2 bath.        OT Problem List: Decreased strength;Decreased knowledge of  use of DME or AE;Decreased activity tolerance;Cardiopulmonary status limiting activity;Increased edema;Obesity;Impaired balance (sitting and/or standing);Decreased safety awareness;Decreased cognition      OT Treatment/Interventions: Self-care/ADL training;Therapeutic exercise;Patient/family education;Balance training;Energy conservation;Therapeutic activities;DME and/or AE instruction    OT Goals(Current goals can be found in the care plan section) Acute Rehab OT Goals Patient Stated Goal: return home OT Goal Formulation: With patient Time For Goal Achievement: 11/08/20 Potential to Achieve Goals: Good  OT Frequency: Min 2X/week   Barriers to D/C:            Co-evaluation              AM-PAC OT "6 Clicks" Daily Activity     Outcome Measure Help from another person eating meals?: A Little Help from another person taking care of personal grooming?: A Little Help from another person toileting, which includes using toliet, bedpan, or urinal?: A Lot Help from another person bathing (including washing, rinsing, drying)?: A Lot Help from another person to put on and taking off regular upper body clothing?: A Little Help from another person to put on and taking off regular lower body clothing?: A Lot 6 Click Score: 15   End of Session Equipment Utilized During Treatment: Gait belt;Rolling walker Nurse Communication: Mobility status  Activity Tolerance: Patient tolerated treatment well Patient left: in  chair;with call bell/phone within reach  OT Visit Diagnosis: Unsteadiness on feet (R26.81);Other abnormalities of gait and mobility (R26.89);Muscle weakness (generalized) (M62.81)                Time: 3790-2409 OT Time Calculation (min): 49 min Charges:  OT General Charges $OT Visit: 1 Visit OT Evaluation $OT Eval Moderate Complexity: 1 Mod OT Treatments $Self Care/Home Management : 23-37 mins  Zenovia Jarred, MSOT, OTR/L Acute Rehabilitation Services [facility] Office Number:  [phone] Pager: [phone]  Zenovia Jarred 10/25/2020, 11:49 AM

## 2020-10-26 LAB — COMPREHENSIVE METABOLIC PANEL
ALT: 16 U/L (ref 0–44)
AST: 35 U/L (ref 15–41)
Albumin: 2.7 g/dL — ABNORMAL LOW (ref 3.5–5.0)
Alkaline Phosphatase: 57 U/L (ref 38–126)
Anion gap: 13 (ref 5–15)
BUN: 28 mg/dL — ABNORMAL HIGH (ref 8–23)
CO2: 26 mmol/L (ref 22–32)
Calcium: 8.6 mg/dL — ABNORMAL LOW (ref 8.9–10.3)
Chloride: 100 mmol/L (ref 98–111)
Creatinine, Ser: 1.76 mg/dL — ABNORMAL HIGH (ref 0.61–1.24)
GFR, Estimated: 39 mL/min — ABNORMAL LOW (ref >60)
Glucose, Bld: 119 mg/dL — ABNORMAL HIGH (ref 70–99)
Potassium: 3.7 mmol/L (ref 3.5–5.1)
Sodium: 139 mmol/L (ref 135–145)
Total Bilirubin: 0.5 mg/dL (ref 0.3–1.2)
Total Protein: 7.2 g/dL (ref 6.5–8.1)

## 2020-10-26 LAB — CBC
HCT: 36.1 % — ABNORMAL LOW (ref 39.0–52.0)
Hemoglobin: 11.2 g/dL — ABNORMAL LOW (ref 13.0–17.0)
MCH: 23.6 pg — ABNORMAL LOW (ref 26.0–34.0)
MCHC: 31 g/dL (ref 30.0–36.0)
MCV: 76 fL — ABNORMAL LOW (ref 80.0–100.0)
Platelets: 214 10*3/uL (ref 150–400)
RBC: 4.75 MIL/uL (ref 4.22–5.81)
RDW: 18.6 % — ABNORMAL HIGH (ref 11.5–15.5)
WBC: 18.1 10*3/uL — ABNORMAL HIGH (ref 4.0–10.5)
nRBC: 0 % (ref 0.0–0.2)

## 2020-10-26 LAB — GLUCOSE, CAPILLARY
Glucose-Capillary: 142 mg/dL — ABNORMAL HIGH (ref 70–99)
Glucose-Capillary: 132 mg/dL — ABNORMAL HIGH (ref 70–99)
Glucose-Capillary: 146 mg/dL — ABNORMAL HIGH (ref 70–99)
Glucose-Capillary: 129 mg/dL — ABNORMAL HIGH (ref 70–99)

## 2020-10-26 MED ORDER — SENNOSIDES-DOCUSATE SODIUM 8.6-50 MG PO TABS
2.0000 | ORAL_TABLET | Freq: Every day | ORAL | Status: DC
  Administered 2020-10-26 – 2020-10-27 (×2): 2 via ORAL
  Filled 2020-10-26 (×2): qty 2

## 2020-10-26 MED ORDER — DILTIAZEM HCL ER COATED BEADS 180 MG PO CP24
360.0000 mg | ORAL_CAPSULE | Freq: Every day | ORAL | Status: DC
  Administered 2020-10-26 – 2020-10-28 (×3): 360 mg via ORAL
  Filled 2020-10-26 (×3): qty 2

## 2020-10-26 MED ORDER — POLYETHYLENE GLYCOL 3350 17 G PO PACK
17.0000 g | PACK | Freq: Two times a day (BID) | ORAL | Status: DC
  Administered 2020-10-26 – 2020-10-28 (×4): 17 g via ORAL
  Filled 2020-10-26 (×4): qty 1

## 2020-10-26 NOTE — Plan of Care (Signed)
  Problem: Fluid Volume: Goal: Hemodynamic stability will improve Outcome: Progressing   Problem: Clinical Measurements: Goal: Diagnostic test results will improve Outcome: Progressing   Problem: Clinical Measurements: Goal: Signs and symptoms of infection will decrease Outcome: Progressing   Problem: Respiratory: Goal: Ability to maintain adequate ventilation will improve Outcome: Progressing   

## 2020-10-26 NOTE — Plan of Care (Signed)
  Problem: Education: Goal: Knowledge of General Education information will improve Description: Including pain rating scale, medication(s)/side effects and non-pharmacologic comfort measures Outcome: Progressing   Problem: Health Behavior/Discharge Planning: Goal: Ability to manage health-related needs will improve Outcome: Progressing   Problem: Clinical Measurements: Goal: Ability to maintain clinical measurements within normal limits will improve Outcome: Progressing Goal: Will remain free from infection Outcome: Progressing Goal: Diagnostic test results will improve Outcome: Progressing Goal: Respiratory complications will improve Outcome: Progressing Goal: Cardiovascular complication will be avoided Outcome: Progressing   Problem: Coping: Goal: Level of anxiety will decrease Outcome: Progressing   Problem: Pain Managment: Goal: General experience of comfort will improve Outcome: Progressing   Problem: Safety: Goal: Ability to remain free from injury will improve Outcome: Progressing   Problem: Skin Integrity: Goal: Risk for impaired skin integrity will decrease Outcome: Progressing   Problem: Education: Goal: Ability to demonstrate management of disease process will improve Outcome: Progressing Goal: Ability to verbalize understanding of medication therapies will improve Outcome: Progressing Goal: Individualized Educational Video(s) Outcome: Progressing   Problem: Activity: Goal: Capacity to carry out activities will improve Outcome: Progressing   Problem: Cardiac: Goal: Ability to achieve and maintain adequate cardiopulmonary perfusion will improve Outcome: Progressing   Problem: Fluid Volume: Goal: Hemodynamic stability will improve Outcome: Progressing   Problem: Clinical Measurements: Goal: Diagnostic test results will improve Outcome: Progressing Goal: Signs and symptoms of infection will decrease Outcome: Progressing   Problem:  Respiratory: Goal: Ability to maintain adequate ventilation will improve Outcome: Progressing

## 2020-10-26 NOTE — Progress Notes (Signed)
PROGRESS NOTE        PATIENT DETAILS Name: Terry [name] Age: 79 y.o. Sex: male Date of Birth: Mar 16, 1941 Admit Date: 10/23/2020 Admitting Physician Eben Burow, MD SWF:[id number], [name], MD  Brief Narrative: Patient is a 79 y.o. male PAF on anticoagulation, chronic diastolic heart failure, HTN, CKD stage IIIb, DM-2, prostate cancer, hypothyroidism, chronic bilateral lower extremity lymphedema-who presented with fever and generalized weakness-found to have complicated UTI, AKI-started on IV antibiotics and admitted to the hospitalist service.  See below for further details  Significant events: 12/9>> admit to [facility] for fever/weakness-complicated UTI, AKI 73/22>> A. fib with RVR  Significant studies: 12/10>> CT abdomen/pelvis: Chronic hydronephrosis/urothelial thickening of the right-stable stent.  History of prostate cancer with stable pelvic adenopathy 12/10>> chest x-ray: No pneumonia  Antimicrobial therapy: Vancomycin: 12/9 x 1  Flagyl: 12/9 >> 12/10  Cefepime: 12/9 x 1  Ceftriaxone: 12/10 >>  Microbiology data: 12/9>> blood culture: No growth 12/10>> urine culture: Pending  Procedures : None  Consults: Cardiology  DVT Prophylaxis : apixaban (ELIQUIS) tablet 5 mg    Subjective: Feels much better-afebrile for almost 2 days-feels like he has to go to the bathroom for a bowel movement  Assessment/Plan: Sepsis due to complicated UTI: Sepsis physiology has improved-afebrile for almost 2 days now-leukocytosis decreasing-surprisingly both blood/urine cultures are negative-we will continue treatment as presumed UTI given clinical features on presentation-continue Rocephin.    AKI on CKD stage IIIb: AKI likely hemodynamically mediated-in the setting of sepsis/losartan/Lasix use-remains volume overloaded (has chronic lymphedema at baseline-worsened due to IV fluid resuscitation)-renal function relatively stable-continue on Lasix.    PAF with  RVR: RVR provoked by acute infection-required Cardizem infusion for rate control-maintaining sinus rhythm-continue Cardizem.  Remains on Eliquis.  HTN: BP controlled with Coreg and Cardizem-continue to hold losartan.    Acute on chronic diastolic heart failure/bilateral lower extremity lymphedema: Has chronic bilateral lower extremity lymphedema at baseline-some worsening of volume status due to IV fluid resuscitation-cardiology following-compression wraps in place.  On IV Lasix.  Follow volume status closely.  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  Metastatic prostate cancer: Resume outpatient follow-up with oncology/urology  BPH: Continue Flomax  Hypothyroidism: Continue Synthroid  Gout: Continue allopurinol  Constipation: No response to MiraLAX/senna-spoke with RN today-we will try Dulcolax suppository.  Morbid Obesity: Estimated body mass index is 48.55 kg/m as calculated from the following:   Height as of this encounter: 5\' 7"  (1.702 m).   Weight as of this encounter: 140.6 kg.    Diet: Diet Order            Diet Heart Room service appropriate? Yes; Fluid consistency: Thin  Diet effective now                  Code Status: Full code   Family Communication: Spoke with (daughter-Lisa (is a RN)-[phone]) over phone on 12/12  Disposition Plan: Status is: Inpatient  Remains inpatient appropriate because:Inpatient level of care appropriate due to severity of illness   Dispo: The patient is from: Home              Anticipated d/c is to: Home              Anticipated d/c date is:  3 days              Patient currently is not medically stable to d/c.   Barriers to Discharge: Sepsis physiology due to UTI with AKI-requiring IV antibiotics-not yet medically stable for  discharge  Antimicrobial agents: Anti-infectives (From admission, onward)   Start     Dose/Rate Route Frequency Ordered Stop   10/25/20 0800  cefTRIAXone (ROCEPHIN) 2 g in sodium chloride 0.9 % 100 mL IVPB        2 g 200 mL/hr over 30 Minutes Intravenous Every 24 hours 10/24/20 1514     10/24/20 2200  vancomycin (VANCOREADY) IVPB 1750 mg/350 mL  Status:  Discontinued        1,750 mg 175 mL/hr over 120 Minutes Intravenous Every 24 hours 10/24/20 0107 10/24/20 0741   10/24/20 1000  ceFEPIme (MAXIPIME) 2 g in sodium chloride 0.9 % 100 mL IVPB  Status:  Discontinued        2 g 200 mL/hr over 30 Minutes Intravenous Every 12 hours 10/24/20 0107 10/24/20 0705   10/24/20 0715  cefTRIAXone (ROCEPHIN) 1 g in sodium chloride 0.9 % 100 mL IVPB  Status:  Discontinued        1 g 200 mL/hr over 30 Minutes Intravenous Every 24 hours 10/24/20 0705 10/24/20 1514   10/23/20 2300  vancomycin (VANCOCIN) 2,500 mg in sodium chloride 0.9 % 500 mL IVPB        2,500 mg 250 mL/hr over 120 Minutes Intravenous  Once 10/23/20 2203 10/24/20 0245   10/23/20 2215  ceFEPIme (MAXIPIME) 2 g in sodium chloride 0.9 % 100 mL IVPB        2 g 200 mL/hr over 30 Minutes Intravenous  Once 10/23/20 2201 10/23/20 2353   10/23/20 2215  vancomycin (VANCOCIN) IVPB 1000 mg/200 mL premix  Status:  Discontinued        1,000 mg 200 mL/hr over 60 Minutes Intravenous  Once 10/23/20 2201 10/23/20 2203   10/23/20 2215  metroNIDAZOLE (FLAGYL) tablet 500 mg  Status:  Discontinued        500 mg Oral Every 8 hours 10/23/20 2201 10/24/20 0742       Time spent: 25 minutes-Greater than 50% of this time was spent in counseling, explanation of diagnosis, planning of further management, and coordination of care.  MEDICATIONS: Scheduled Meds: . allopurinol  300 mg Oral Daily  . apixaban  5 mg Oral BID  . carvedilol  25 mg Oral BID WC  . diltiazem  360 mg Oral Daily  . furosemide  80 mg Intravenous BID  . levothyroxine  25 mcg Oral Q0600  .  polyethylene glycol  17 g Oral Daily  . potassium chloride  10 mEq Oral BID  . tamsulosin  0.4 mg Oral Daily   Continuous Infusions: . cefTRIAXone (ROCEPHIN)  IV 2 g (10/26/20 0820)   PRN Meds:.acetaminophen **OR** acetaminophen, bisacodyl, senna-docusate   PHYSICAL EXAM: Vital signs: Vitals:   10/26/20 0536 10/26/20 0723 10/26/20 0815 10/26/20 1111  BP: 136/87 128/76 (!) 162/86 137/78  Pulse: 71 68 68 63  Resp: 18 18    Temp: 98.1 F (36.7 C) 98.1 F (36.7 C)    TempSrc: Oral Oral    SpO2: 97% 90% 93% 97%  Weight:      Height:       Filed Weights   10/23/20 2154  Weight: (!) 140.6 kg   Body mass index is 48.55 kg/m.   Gen Exam:Alert awake-not in any distress HEENT:atraumatic,  normocephalic Chest: B/L clear to auscultation anteriorly CVS:S1S2 regular Abdomen:soft non tender, non distended Extremities:++ edema Neurology: Non focal Skin: no rash  I have personally reviewed following labs and imaging studies  LABORATORY DATA: CBC: Recent Labs  Lab 10/23/20 2201 10/24/20 0751 10/25/20 0613 10/26/20 0525  WBC 14.9* 22.4* 21.7* 18.1*  NEUTROABS 13.7*  --   --   --   HGB 11.9* 12.6* 10.3* 11.2*  HCT 38.5* 43.6 33.6* 36.1*  MCV 78.1* 81.6 76.7* 76.0*  PLT 221 156 196 947    Basic Metabolic Panel: Recent Labs  Lab 10/23/20 2201 10/24/20 0751 10/25/20 0613 10/26/20 0525  NA 140 139 137 139  K 3.7 4.5 3.5 3.7  CL 105 104 101 100  CO2 24 24 26 26   GLUCOSE 92 88 147* 119*  BUN 20 20 26* 28*  CREATININE 1.70* 1.84* 1.98* 1.76*  CALCIUM 9.1 8.7* 8.5* 8.6*    GFR: Estimated Creatinine Clearance: 46.2 mL/min (A) (by C-G formula based on SCr of 1.76 mg/dL (H)).  Liver Function Tests: Recent Labs  Lab 10/23/20 2201 10/24/20 0751 10/26/20 0525  AST 22 27 35  ALT 14 13 16   ALKPHOS 53 50 57  BILITOT 0.8 1.0 0.5  PROT 7.3 7.3 7.2  ALBUMIN 3.1* 2.8* 2.7*   No results for input(s): LIPASE, AMYLASE in the last 168 hours. No results for input(s):  AMMONIA in the last 168 hours.  Coagulation Profile: Recent Labs  Lab 10/23/20 2201  INR 1.3*    Cardiac Enzymes: No results for input(s): CKTOTAL, CKMB, CKMBINDEX, TROPONINI in the last 168 hours.  BNP (last 3 results) No results for input(s): PROBNP in the last 8760 hours.  Lipid Profile: No results for input(s): CHOL, HDL, LDLCALC, TRIG, CHOLHDL, LDLDIRECT in the last 72 hours.  Thyroid Function Tests: No results for input(s): TSH, T4TOTAL, FREET4, T3FREE, THYROIDAB in the last 72 hours.  Anemia Panel: No results for input(s): VITAMINB12, FOLATE, FERRITIN, TIBC, IRON, RETICCTPCT in the last 72 hours.  Urine analysis:    Component Value Date/Time   COLORURINE AMBER (A) 10/24/2020 0423   APPEARANCEUR CLOUDY (A) 10/24/2020 0423   APPEARANCEUR Cloudy (A) 08/21/2020 1530   LABSPEC 1.015 10/24/2020 0423   PHURINE 6.0 10/24/2020 0423   GLUCOSEU NEGATIVE 10/24/2020 0423   HGBUR LARGE (A) 10/24/2020 0423   BILIRUBINUR NEGATIVE 10/24/2020 0423   BILIRUBINUR Negative 08/21/2020 1530   KETONESUR NEGATIVE 10/24/2020 0423   PROTEINUR 100 (A) 10/24/2020 0423   NITRITE NEGATIVE 10/24/2020 0423   LEUKOCYTESUR LARGE (A) 10/24/2020 0423    Sepsis Labs: Lactic Acid, Venous    Component Value Date/Time   LATICACIDVEN 1.8 10/24/2020 0130    MICROBIOLOGY: Recent Results (from the past 240 hour(s))  Blood Culture (routine x 2)     Status: None (Preliminary result)   Collection Time: 10/23/20 10:58 PM   Specimen: BLOOD  Result Value Ref Range Status   Specimen Description BLOOD LEFT ANTECUBITAL  Final   Special Requests   Final    BOTTLES DRAWN AEROBIC AND ANAEROBIC Blood Culture adequate volume   Culture   Final    NO GROWTH 2 DAYS Performed at [facility], [place] [address], [place], [place] [address]    Report Status PENDING  Incomplete  Resp Panel by RT-PCR (Flu A&B, Covid) Nasopharyngeal Swab     Status: None   Collection Time: 10/23/20 10:58 PM   Specimen:  Nasopharyngeal Swab; Nasopharyngeal(NP) swabs in vial transport medium  Result Value Ref Range  Status   SARS Coronavirus 2 by RT PCR NEGATIVE NEGATIVE Final    Comment: (NOTE) SARS-CoV-2 target nucleic acids are NOT DETECTED.  The SARS-CoV-2 RNA is generally detectable in upper respiratory specimens during the acute phase of infection. The lowest concentration of SARS-CoV-2 viral copies this assay can detect is 138 copies/mL. A negative result does not preclude SARS-Cov-2 infection and should not be used as the sole basis for treatment or other patient management decisions. A negative result may occur with  improper specimen collection/handling, submission of specimen other than nasopharyngeal swab, presence of viral mutation(s) within the areas targeted by this assay, and inadequate number of viral copies(<138 copies/mL). A negative result must be combined with clinical observations, patient history, and epidemiological information. The expected result is Negative.  Fact Sheet for Patients:  EntrepreneurPulse.com.au  Fact Sheet for Healthcare Providers:  IncredibleEmployment.be  This test is no t yet approved or cleared by the [place] FDA and  has been authorized for detection and/or diagnosis of SARS-CoV-2 by FDA under an Emergency Use Authorization (EUA). This EUA will remain  in effect (meaning this test can be used) for the duration of the COVID-19 declaration under Section 564(b)(1) of the Act, 21 U.S.C.section 360bbb-3(b)(1), unless the authorization is terminated  or revoked sooner.       Influenza A by PCR NEGATIVE NEGATIVE Final   Influenza B by PCR NEGATIVE NEGATIVE Final    Comment: (NOTE) The Xpert Xpress SARS-CoV-2/FLU/RSV plus assay is intended as an aid in the diagnosis of influenza from Nasopharyngeal swab specimens and should not be used as a sole basis for treatment. Nasal washings and aspirates are unacceptable for  Xpert Xpress SARS-CoV-2/FLU/RSV testing.  Fact Sheet for Patients: EntrepreneurPulse.com.au  Fact Sheet for Healthcare Providers: IncredibleEmployment.be  This test is not yet approved or cleared by the [place] FDA and has been authorized for detection and/or diagnosis of SARS-CoV-2 by FDA under an Emergency Use Authorization (EUA). This EUA will remain in effect (meaning this test can be used) for the duration of the COVID-19 declaration under Section 564(b)(1) of the Act, 21 U.S.C. section 360bbb-3(b)(1), unless the authorization is terminated or revoked.  Performed at [facility], [place] [address] [place], [place] [address]   Blood Culture (routine x 2)     Status: None (Preliminary result)   Collection Time: 10/23/20 11:05 PM   Specimen: BLOOD  Result Value Ref Range Status   Specimen Description BLOOD RIGHT ANTECUBITAL  Final   Special Requests   Final    BOTTLES DRAWN AEROBIC AND ANAEROBIC Blood Culture results may not be optimal due to an inadequate volume of blood received in culture bottles   Culture   Final    NO GROWTH 2 DAYS Performed at [facility], [place] [address] [place], [place] [address]    Report Status PENDING  Incomplete  Urine culture     Status: Abnormal   Collection Time: 10/24/20  4:23 AM   Specimen: Urine, Clean Catch  Result Value Ref Range Status   Specimen Description URINE, CLEAN CATCH  Final   Special Requests NONE  Final   Culture (A)  Final    <10,000 COLONIES/mL INSIGNIFICANT GROWTH Performed at [facility], [place] [address], [place], [place] [address]    Report Status 10/25/2020 FINAL  Final    RADIOLOGY STUDIES/RESULTS: DG Chest Port 1 View  Result Date: 10/24/2020 CLINICAL DATA:  Weakness. EXAM: PORTABLE CHEST 1 VIEW COMPARISON:  October 23, 2020. FINDINGS: Stable mild cardiomegaly is noted with probable central pulmonary vascular congestion. No pneumothorax or pleural  effusion is noted. Both lungs are clear. The visualized skeletal structures are unremarkable. IMPRESSION: Stable mild cardiomegaly with probable central pulmonary vascular congestion. Electronically Signed   By: Marijo Conception M.D.   On: 10/24/2020 14:47     LOS: 2 days   Oren Binet, MD  [facility]    To contact the attending provider between 7A-7P or the covering provider during after hours 7P-7A, please log into the web site [email or web address] and access using universal [place] password for that web site. If you do not have the password, please call the hospital operator.  10/26/2020, 1:54 PM

## 2020-10-26 NOTE — Progress Notes (Addendum)
Progress Note  Patient Name: Terry [name] Date of Encounter: 10/26/2020  [facility] Cardiologist: Ida Rogue, MD   Subjective   Denies any CP or SOB.   Inpatient Medications    Scheduled Meds:  allopurinol  300 mg Oral Daily   apixaban  5 mg Oral BID   carvedilol  25 mg Oral BID WC   furosemide  80 mg Intravenous BID   levothyroxine  25 mcg Oral Q0600   polyethylene glycol  17 g Oral Daily   potassium chloride  10 mEq Oral BID   tamsulosin  0.4 mg Oral Daily   Continuous Infusions:  cefTRIAXone (ROCEPHIN)  IV 2 g (10/26/20 0820)   diltiazem (CARDIZEM) infusion 15 mg/hr (10/26/20 0942)   PRN Meds: acetaminophen **OR** acetaminophen, bisacodyl, diltiazem, senna-docusate   Vital Signs    Vitals:   10/26/20 0327 10/26/20 0536 10/26/20 0723 10/26/20 0815  BP: 134/68 136/87 128/76 (!) 162/86  Pulse: 67 71 68 68  Resp: 18 18 18    Temp: 98.2 F (36.8 C) 98.1 F (36.7 C) 98.1 F (36.7 C)   TempSrc: Oral Oral Oral   SpO2: 96% 97% 90% 93%  Weight:      Height:        Intake/Output Summary (Last 24 hours) at 10/26/2020 1041 Last data filed at 10/26/2020 0534 Gross per 24 hour  Intake 572.88 ml  Output 6600 ml  Net -6027.12 ml   Last 3 Weights 10/23/2020 10/20/2020 09/12/2020  Weight (lbs) 310 lb 312 lb 303 lb  Weight (kg) 140.615 kg 141.522 kg 137.44 kg      Telemetry    NSR overnight, no recurrent afib - Personally Reviewed  ECG    Atrial fibrillation with RVR - Personally Reviewed  Physical Exam   GEN: No acute distress.   Neck: No JVD Cardiac: RRR, no murmurs, rubs, or gallops.  Respiratory: Clear to auscultation bilaterally. GI: Soft, nontender, non-distended  MS: 2+ leg edema; No deformity. Neuro:  Nonfocal  Psych: Normal affect   Labs    High Sensitivity Troponin:  No results for input(s): TROPONINIHS in the last 720 hours.    Chemistry Recent Labs  Lab 10/23/20 2201 10/24/20 0751 10/25/20 0613 10/26/20 0525  NA 140 139 137  139  K 3.7 4.5 3.5 3.7  CL 105 104 101 100  CO2 24 24 26 26   GLUCOSE 92 88 147* 119*  BUN 20 20 26* 28*  CREATININE 1.70* 1.84* 1.98* 1.76*  CALCIUM 9.1 8.7* 8.5* 8.6*  PROT 7.3 7.3  --  7.2  ALBUMIN 3.1* 2.8*  --  2.7*  AST 22 27  --  35  ALT 14 13  --  16  ALKPHOS 53 50  --  57  BILITOT 0.8 1.0  --  0.5  GFRNONAA 41* 37* 34* 39*  ANIONGAP 11 11 10 13      Hematology Recent Labs  Lab 10/24/20 0751 10/25/20 0613 10/26/20 0525  WBC 22.4* 21.7* 18.1*  RBC 5.34 4.38 4.75  HGB 12.6* 10.3* 11.2*  HCT 43.6 33.6* 36.1*  MCV 81.6 76.7* 76.0*  MCH 23.6* 23.5* 23.6*  MCHC 28.9* 30.7 31.0  RDW 19.4* 18.6* 18.6*  PLT 156 196 214    BNPNo results for input(s): BNP, PROBNP in the last 168 hours.   DDimer No results for input(s): DDIMER in the last 168 hours.   Radiology    DG Chest Port 1 View  Result Date: 10/24/2020 CLINICAL DATA:  Weakness. EXAM: PORTABLE CHEST 1  VIEW COMPARISON:  October 23, 2020. FINDINGS: Stable mild cardiomegaly is noted with probable central pulmonary vascular congestion. No pneumothorax or pleural effusion is noted. Both lungs are clear. The visualized skeletal structures are unremarkable. IMPRESSION: Stable mild cardiomegaly with probable central pulmonary vascular congestion. Electronically Signed   By: Marijo Conception M.D.   On: 10/24/2020 14:47    Cardiac Studies   Echo 07/29/2020  1. Left ventricular ejection fraction, by estimation, is 60 to 65%. The  left ventricle has normal function. The left ventricle has no regional  wall motion abnormalities. There is mild left ventricular hypertrophy.  Left ventricular diastolic parameters  are consistent with Grade II diastolic dysfunction (pseudonormalization).   2. Right ventricular systolic function is normal. The right ventricular  size is mildly enlarged.   3. Left atrial size was mild to moderately dilated.   4. Right atrial size was mildly dilated.   5. The mitral valve is normal in structure.  No evidence of mitral valve  regurgitation. No evidence of mitral stenosis.   6. Tricuspid valve regurgitation is mild to moderate.   7. The aortic valve was not well visualized. Aortic valve regurgitation  is mild. Mild aortic valve stenosis.   Patient Profile     79 y.o. male  with PMH of PAF, CHF, morbid obesity, HTN, lower extremity edema, CKD, stage III, and colon cancer with generalized weakness presented with UTI and sepsis. He was in sinus rhythm on arrival however went into atrial fibrillation and was treated with rate control therapy, he converted overnight  Assessment & Plan    1. Atrial fibrillation with RVR   - in the setting of urosepsis             - started on Eliquis, reassess as outpatient the duration of NOAC             - CHA2DS2-Vasc score 5 (CHF, HTN, DM II, age>75)  - currently on 15 mg/hr of IV diltiazem which equates to 360mg  diltiazem CD  2. Acute on chronic diastolic CHF             - continue to have 2-3+ pitting edema on exam, note, patient has been having edema for more than a year             - continue IV diuretic 80mg  BID, renal function slowly improving.    3. HTN   4. Morbid obesity   5. Urosepsis  6. AKI: renal function slowly improving with diuresis.    7. Metastatic prostate CA       For questions or updates, please contact [place] Please consult [email or web address] for contact info under        Signed, Almyra Deforest, [place]  10/26/2020, 10:41 AM    Patient examined chart reviewed making progress with LE edema. Still plus 2-3 right > left Not sure 6 L diuresis is accurate Maintaining NSR change to PO cardizem Cr is stable with diuresis Continue eliquis Still on iv antibiotics for urospesis  Jenkins Rouge MD [facility]

## 2020-10-27 ENCOUNTER — Inpatient Hospital Stay (HOSPITAL_COMMUNITY): Payer: Medicare HMO

## 2020-10-27 DIAGNOSIS — I5031 Acute diastolic (congestive) heart failure: Secondary | ICD-10-CM

## 2020-10-27 LAB — BASIC METABOLIC PANEL
Anion gap: 11 (ref 5–15)
Anion gap: 13 (ref 5–15)
BUN: 27 mg/dL — ABNORMAL HIGH (ref 8–23)
BUN: 27 mg/dL — ABNORMAL HIGH (ref 8–23)
CO2: 27 mmol/L (ref 22–32)
CO2: 25 mmol/L (ref 22–32)
Calcium: 8.4 mg/dL — ABNORMAL LOW (ref 8.9–10.3)
Calcium: 8.8 mg/dL — ABNORMAL LOW (ref 8.9–10.3)
Chloride: 100 mmol/L (ref 98–111)
Chloride: 99 mmol/L (ref 98–111)
Creatinine, Ser: 1.59 mg/dL — ABNORMAL HIGH (ref 0.61–1.24)
Creatinine, Ser: 1.21 mg/dL (ref 0.61–1.24)
GFR, Estimated: 44 mL/min — ABNORMAL LOW (ref >60)
GFR, Estimated: >60 mL/min (ref >60)
Glucose, Bld: 177 mg/dL — ABNORMAL HIGH (ref 70–99)
Glucose, Bld: 97 mg/dL (ref 70–99)
Potassium: 3.1 mmol/L — ABNORMAL LOW (ref 3.5–5.1)
Potassium: 4 mmol/L (ref 3.5–5.1)
Sodium: 138 mmol/L (ref 135–145)
Sodium: 137 mmol/L (ref 135–145)

## 2020-10-27 LAB — CBC
HCT: 36.2 % — ABNORMAL LOW (ref 39.0–52.0)
Hemoglobin: 11.1 g/dL — ABNORMAL LOW (ref 13.0–17.0)
MCH: 23.1 pg — ABNORMAL LOW (ref 26.0–34.0)
MCHC: 30.7 g/dL (ref 30.0–36.0)
MCV: 75.3 fL — ABNORMAL LOW (ref 80.0–100.0)
Platelets: 208 10*3/uL (ref 150–400)
RBC: 4.81 MIL/uL (ref 4.22–5.81)
RDW: 18.8 % — ABNORMAL HIGH (ref 11.5–15.5)
WBC: 11.3 10*3/uL — ABNORMAL HIGH (ref 4.0–10.5)
nRBC: 0.2 % (ref 0.0–0.2)

## 2020-10-27 LAB — ECHOCARDIOGRAM COMPLETE
Area-P 1/2: 3.39 cm2
Height: 67 in
S' Lateral: 2.5 cm
Weight: 4960 oz

## 2020-10-27 LAB — GLUCOSE, CAPILLARY
Glucose-Capillary: 166 mg/dL — ABNORMAL HIGH (ref 70–99)
Glucose-Capillary: 101 mg/dL — ABNORMAL HIGH (ref 70–99)
Glucose-Capillary: 130 mg/dL — ABNORMAL HIGH (ref 70–99)

## 2020-10-27 LAB — MAGNESIUM: Magnesium: 2 mg/dL (ref 1.7–2.4)

## 2020-10-27 MED ORDER — POTASSIUM CHLORIDE CRYS ER 20 MEQ PO TBCR
40.0000 meq | EXTENDED_RELEASE_TABLET | Freq: Every day | ORAL | Status: DC
  Administered 2020-10-28: 40 meq via ORAL
  Filled 2020-10-27: qty 2

## 2020-10-27 MED ORDER — COVID-19 MRNA VACCINE (PFIZER) 30 MCG/0.3ML IM SUSP
0.3000 mL | Freq: Once | INTRAMUSCULAR | Status: AC
  Administered 2020-10-27: 0.3 mL via INTRAMUSCULAR
  Filled 2020-10-27: qty 0.3

## 2020-10-27 MED ORDER — SPIRONOLACTONE 25 MG PO TABS
25.0000 mg | ORAL_TABLET | Freq: Every day | ORAL | Status: DC
  Administered 2020-10-27 – 2020-10-28 (×2): 25 mg via ORAL
  Filled 2020-10-27 (×2): qty 1

## 2020-10-27 MED ORDER — PERFLUTREN LIPID MICROSPHERE
1.0000 mL | INTRAVENOUS | Status: AC | PRN
  Administered 2020-10-27: 1 mL via INTRAVENOUS
  Filled 2020-10-27 (×2): qty 10

## 2020-10-27 MED ORDER — FUROSEMIDE 10 MG/ML IJ SOLN
80.0000 mg | Freq: Two times a day (BID) | INTRAMUSCULAR | Status: DC
  Administered 2020-10-27: 80 mg via INTRAVENOUS
  Filled 2020-10-27: qty 8

## 2020-10-27 MED ORDER — POTASSIUM CHLORIDE CRYS ER 20 MEQ PO TBCR
40.0000 meq | EXTENDED_RELEASE_TABLET | ORAL | Status: AC
  Administered 2020-10-27 (×2): 40 meq via ORAL
  Filled 2020-10-27 (×2): qty 2

## 2020-10-27 NOTE — TOC Initial Note (Signed)
Transition of Care [facility]) - Initial/Assessment Note    Patient Details  Name: Terry [name] MRN: [id number] Date of Birth: 1940-11-27  Transition of Care [facility]) CM/SW Contact:    Trula Ore, [place] Phone Number: 10/27/2020, 1:26 PM  Clinical Narrative:                   CSW received consult for possible SNF placement at time of discharge. CSW spoke with patient regarding PT recommendation of SNF placement at time of discharge. Patient comes from home and lives with daughter and son. Patient expressed understanding of PT recommendation and is agreeable to SNF placement at time of discharge. Patient gave CSW permission to fax out initial referral near [facility] area  Patient has not COVID vaccines. Patient is interested in receiveing the COVID vaccine. CSW informed MD. No further questions reported at this time. CSW to continue to follow and assist with discharge planning needs.  Expected Discharge Plan: Skilled Nursing Facility Barriers to Discharge: Continued Medical Work up   Patient Goals and CMS Choice Patient states their goals for this hospitalization and ongoing recovery are:: to go to SNF CMS Medicare.gov Compare Post Acute Care list provided to:: Patient Choice offered to / list presented to : Patient  Expected Discharge Plan and Services Expected Discharge Plan: [place]       Living arrangements for the past 2 months: Single Family Home                                      Prior Living Arrangements/Services Living arrangements for the past 2 months: Single Family Home Lives with:: Self,Adult Children (lives with daughter and son) Patient language and need for interpreter reviewed:: Yes Do you feel safe going back to the place where you live?: No   SNF  Need for Family Participation in Patient Care: Yes (Comment) Care giver support system in place?: Yes (comment)   Criminal Activity/Legal Involvement Pertinent to Current  Situation/Hospitalization: No - Comment as needed  Activities of Daily Living Home Assistive Devices/Equipment: Walker (specify type),Cane (specify quad or straight) ADL Screening (condition at time of admission) Patient's cognitive ability adequate to safely complete daily activities?: Yes Is the patient deaf or have difficulty hearing?: No Does the patient have difficulty seeing, even when wearing glasses/contacts?: No Does the patient have difficulty concentrating, remembering, or making decisions?: No Patient able to express need for assistance with ADLs?: Yes Does the patient have difficulty dressing or bathing?: No Independently performs ADLs?: Yes (appropriate for developmental age) Does the patient have difficulty walking or climbing stairs?: Yes Weakness of Legs: Both Weakness of Arms/Hands: None  Permission Sought/Granted Permission sought to share information with : Case Manager,Family Chief Financial Officer Permission granted to share information with : Yes, Verbal Permission Granted  Share Information with NAME: [name]  Permission granted to share info w AGENCY: SNF  Permission granted to share info w Relationship: daughter  Permission granted to share info w Contact Information: [name] [phone]  Emotional Assessment Appearance:: Appears stated age Attitude/Demeanor/Rapport: Gracious Affect (typically observed): Calm Orientation: : Oriented to Self,Oriented to Place,Oriented to  Time,Oriented to Situation Alcohol / Substance Use: Not Applicable Psych Involvement: No (comment)  Admission diagnosis:  SOB (shortness of breath) [R06.02] Sepsis ([place]) [A41.9] Urinary tract infection without hematuria, site unspecified [N39.0] Sepsis, due to unspecified organism, unspecified whether acute organ dysfunction present [facility]) [A41.9] Patient Active Problem  List   Diagnosis Date Noted  . Sepsis ([place]) 10/24/2020  . Complicated UTI (urinary tract infection)  10/24/2020  . Chronic heart failure with preserved ejection fraction (HFpEF) ([place]) 10/24/2020  . CHF, chronic ([place])   . Prostate cancer ([place])   . Congestive heart failure ([place]) 07/02/2020  . Macrocytic anemia 06/30/2020  . Elevated PSA 06/30/2020  . Cellulitis of lower extremity 06/30/2020  . Prediabetes 01/04/2019  . Paroxysmal atrial fibrillation ([place]) 01/01/2019  . Risk for falls 12/27/2017  . 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[place]) 10/18/2017  . Right ureteral stone 10/01/2017  . Iron deficiency anemia due to chronic blood loss 06/22/2016  . 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  . 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  . Depression with anxiety 06/19/2015  . CKD (chronic kidney disease) stage 3, GFR 30-59 ml/min (HCC) 05/26/2015  . Essential hypertension 11/01/2014  . Gouty arthritis of toe 11/01/2014  . Morbid obesity due to excess calories ([place]) 11/01/2014  . Primary osteoarthritis of both knees 11/01/2014   PCP:  Susy Frizzle, MD Pharmacy:   [facility], [place] [address] Phone: [phone] Fax: [phone]  [place]), [place] - [address] [place] [address] Phone: [phone] Fax: [phone]     Social Determinants of Health (SDOH) Interventions    Readmission Risk Interventions No flowsheet data found.

## 2020-10-27 NOTE — Progress Notes (Signed)
Progress Note  Patient Name: Terry [name] Date of Encounter: 10/27/2020  [facility] Cardiologist: Ida Rogue, MD   Subjective   3 beats of NSVT overnight. He remains in SR. Patient put out 4.7L overnight. Still volume up on exam. Breathing is improving. No chest pain. BMET pending.  Inpatient Medications    Scheduled Meds: . allopurinol  300 mg Oral Daily  . apixaban  5 mg Oral BID  . carvedilol  25 mg Oral BID WC  . diltiazem  360 mg Oral Daily  . furosemide  80 mg Intravenous BID  . levothyroxine  25 mcg Oral Q0600  . polyethylene glycol  17 g Oral BID  . potassium chloride  10 mEq Oral BID  . senna-docusate  2 tablet Oral QHS  . tamsulosin  0.4 mg Oral Daily   Continuous Infusions: . cefTRIAXone (ROCEPHIN)  IV Stopped (10/26/20 0934)   PRN Meds: acetaminophen **OR** acetaminophen, bisacodyl   Vital Signs    Vitals:   10/27/20 0055 10/27/20 0155 10/27/20 0200 10/27/20 0537  BP: (!) 153/74 (!) 147/69 (!) 142/68 139/70  Pulse: 67 66 62 66  Resp: 18 18 18 18   Temp: 97.8 F (36.6 C) 97.8 F (36.6 C) 97.8 F (36.6 C) 97.8 F (36.6 C)  TempSrc: Oral Oral Oral Oral  SpO2: 95% 95% 97% 96%  Weight:      Height:        Intake/Output Summary (Last 24 hours) at 10/27/2020 0711 Last data filed at 10/27/2020 0650 Gross per 24 hour  Intake 850.85 ml  Output 4700 ml  Net -3849.15 ml   Last 3 Weights 10/23/2020 10/20/2020 09/12/2020  Weight (lbs) 310 lb 312 lb 303 lb  Weight (kg) 140.615 kg 141.522 kg 137.44 kg      Telemetry    NSR, HR 60s, 3 beats NSVT - Personally Reviewed  ECG    pending - Personally Reviewed  Physical Exam   GEN: No acute distress.   Neck: No JVD Cardiac: RRR, no murmurs, rubs, or gallops.  Respiratory: diffusely diminished, wheezing GI: Soft, nontender, non-distended  MS: 1-2+ b/l lower leg edema; No deformity. Neuro:  Nonfocal  Psych: Normal affect   Labs    High Sensitivity Troponin:  No results for input(s):  TROPONINIHS in the last 720 hours.    Chemistry Recent Labs  Lab 10/23/20 2201 10/24/20 0751 10/25/20 0613 10/26/20 0525  NA 140 139 137 139  K 3.7 4.5 3.5 3.7  CL 105 104 101 100  CO2 24 24 26 26   GLUCOSE 92 88 147* 119*  BUN 20 20 26* 28*  CREATININE 1.70* 1.84* 1.98* 1.76*  CALCIUM 9.1 8.7* 8.5* 8.6*  PROT 7.3 7.3  --  7.2  ALBUMIN 3.1* 2.8*  --  2.7*  AST 22 27  --  35  ALT 14 13  --  16  ALKPHOS 53 50  --  57  BILITOT 0.8 1.0  --  0.5  GFRNONAA 41* 37* 34* 39*  ANIONGAP 11 11 10 13      Hematology Recent Labs  Lab 10/24/20 0751 10/25/20 0613 10/26/20 0525  WBC 22.4* 21.7* 18.1*  RBC 5.34 4.38 4.75  HGB 12.6* 10.3* 11.2*  HCT 43.6 33.6* 36.1*  MCV 81.6 76.7* 76.0*  MCH 23.6* 23.5* 23.6*  MCHC 28.9* 30.7 31.0  RDW 19.4* 18.6* 18.6*  PLT 156 196 214    BNPNo results for input(s): BNP, PROBNP in the last 168 hours.   DDimer No results for input(s):  DDIMER in the last 168 hours.   Radiology    No results found.  Cardiac Studies   Echo 07/29/2020 1. Left ventricular ejection fraction, by estimation, is 60 to 65%. The  left ventricle has normal function. The left ventricle has no regional  wall motion abnormalities. There is mild left ventricular hypertrophy.  Left ventricular diastolic parameters  are consistent with Grade II diastolic dysfunction (pseudonormalization).  2. Right ventricular systolic function is normal. The right ventricular  size is mildly enlarged.  3. Left atrial size was mild to moderately dilated.  4. Right atrial size was mildly dilated.  5. The mitral valve is normal in structure. No evidence of mitral valve  regurgitation. No evidence of mitral stenosis.  6. Tricuspid valve regurgitation is mild to moderate.  7. The aortic valve was not well visualized. Aortic valve regurgitation  is mild. Mild aortic valve stenosis.   Patient Profile     79 y.o. male with pmh of PAF on Eliqus, CHF, morbid obesity, HTN, lower  extremity edema, CKD stage 3, and colon cancer who is beign seen for afib in the setting of weakness, UTI/sepsis.   Assessment & Plan    Afib RVR with a hx of PAF - in the setting if urosepsis - CHADSVASC at least 5 (CHF, HTN, DM2, agex2) - A/c with Eliquis - IV dilt> dilt 360mg  daily. Also on coreg 25mg  BID - Remains in SR - Normal EF per echo 07/2020  Acute on chronic diastolic CHF - IV lasix 80 mg BID - patient put out -4.7L overnight, net -11.2L - needs daily weights - creatinine 1.76> AM labs pending - continue with diuresis  HTN - coreg 25mg  BID - dilt 360mg  daily - BPs reasonable  Urosepsis - leukocytosis improving - abx per primary team  AKI/CKD stage 3 - creatinine improving with diuresis   For questions or updates, please contact [facility] Please consult [email or web address] for contact info under        Signed, Iker Nuttall Ninfa Meeker, PA-C  10/27/2020, 7:11 AM

## 2020-10-27 NOTE — Progress Notes (Signed)
Orthopedic Tech Progress Note Patient Details:  Terry [name] [birth year] [id number] Removed old UNNA BOOTS and then applied a fresh pair.  Patient ID: Terry [name], male   DOB: 1941/06/29, 79 y.o.   MRN: [id number]   Janit Pagan 10/27/2020, 12:31 PM

## 2020-10-27 NOTE — Progress Notes (Signed)
PROGRESS NOTE        PATIENT DETAILS Name: Terry [name] Age: 79 y.o. Sex: male Date of Birth: Sep 19, 1941 Admit Date: 10/23/2020 Admitting Physician Eben Burow, MD [id number], [name], MD  Brief Narrative: Patient is a 79 y.o. male PAF on anticoagulation, chronic diastolic heart failure, HTN, CKD stage IIIb, DM-2, prostate cancer, hypothyroidism, chronic bilateral lower extremity lymphedema-who presented with fever and generalized weakness-found to have complicated UTI, AKI-started on IV antibiotics and admitted to the hospitalist service.  See below for further details  Significant events: 12/9>> admit to [facility] for fever/weakness-complicated UTI, AKI 92/33>> A. fib with RVR  Significant studies: 12/10>> CT abdomen/pelvis: Chronic hydronephrosis/urothelial thickening of the right-stable stent.  History of prostate cancer with stable pelvic adenopathy 12/10>> chest x-ray: No pneumonia  Antimicrobial therapy: Vancomycin: 12/9 x 1  Flagyl: 12/9 >> 12/10  Cefepime: 12/9 x 1  Ceftriaxone: 12/10 >>  Microbiology data: 12/9>> blood culture: No growth 12/10>> urine culture:<10,000 colnies/ml  Procedures : None  Consults: Cardiology  DVT Prophylaxis : apixaban (ELIQUIS) tablet 5 mg    Subjective: Had a small BM yesterday-no major issues overnight.  Lower extremity swelling has decreased.  Assessment/Plan: Sepsis due to complicated UTI: Sepsis physiology has improved-afebrile for almost 2 days now-leukocytosis decreasing-surprisingly both blood/urine cultures are negative (may have gotten antimicrobial therapy prior to cultures being obtained)-we will continue treatment as presumed UTI given clinical features on presentation for approximately 5-7 days.  Continue Rocephin.  AKI on CKD stage IIIb: AKI likely hemodynamically mediated-in the setting of sepsis/losartan/Lasix use-remains volume overloaded (has chronic lymphedema at baseline-worsened  due to IV fluid resuscitation)-renal function slowly improving-continue on Lasix.    PAF with RVR: RVR provoked by acute infection-required Cardizem infusion for rate control-maintaining sinus rhythm-continue Cardizem.  Remains on Eliquis.  HTN: BP controlled with Coreg and Cardizem-continue to hold losartan.    Acute on chronic diastolic heart failure/bilateral lower extremity lymphedema: Has chronic bilateral lower extremity lymphedema at baseline-some worsening of volume status due to IV fluid resuscitation-is noncompliant with Lasix in the outpatient setting-lower extremities wrapped with compression stockings-continue IV Lasix-follow volume status.  Cardiology following.  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  Metastatic prostate cancer: Resume outpatient follow-up with oncology/urology  BPH: Continue Flomax  Hypothyroidism: Continue Synthroid  Gout: Continue allopurinol  Constipation: Had small BM yesterday-continue MiraLAX/senna-he did not want to try Dulcolax suppository yesterday.  Follow.    Morbid Obesity: Estimated body mass index is 48.55 kg/m as calculated from the following:   Height as of this encounter: 5\' 7"  (1.702 m).   Weight as of this encounter: 140.6 kg.   Note-counseled extensively regarding importance of COVID-19 vaccine-over the past few days-this morning counseled again-he will think about it and let me know.  Diet: Diet Order            Diet Heart Room service appropriate? Yes; Fluid consistency: Thin  Diet effective now                  Code Status: Full code   Family Communication: Spoke with (daughter-Lisa (is a RN)-[phone]) left a voicemail on 12/13.    Disposition Plan: Status is: Inpatient  Remains inpatient appropriate because:Inpatient level of care  appropriate due  to severity of illness   Dispo: The patient is from: Home              Anticipated d/c is to: Home              Anticipated d/c date is: 3 days              Patient currently is not medically stable to d/c.   Barriers to Discharge: Sepsis physiology due to UTI with AKI-requiring IV antibiotics-not yet medically stable for discharge  Antimicrobial agents: Anti-infectives (From admission, onward)   Start     Dose/Rate Route Frequency Ordered Stop   10/25/20 0800  cefTRIAXone (ROCEPHIN) 2 g in sodium chloride 0.9 % 100 mL IVPB        2 g 200 mL/hr over 30 Minutes Intravenous Every 24 hours 10/24/20 1514     10/24/20 2200  vancomycin (VANCOREADY) IVPB 1750 mg/350 mL  Status:  Discontinued        1,750 mg 175 mL/hr over 120 Minutes Intravenous Every 24 hours 10/24/20 0107 10/24/20 0741   10/24/20 1000  ceFEPIme (MAXIPIME) 2 g in sodium chloride 0.9 % 100 mL IVPB  Status:  Discontinued        2 g 200 mL/hr over 30 Minutes Intravenous Every 12 hours 10/24/20 0107 10/24/20 0705   10/24/20 0715  cefTRIAXone (ROCEPHIN) 1 g in sodium chloride 0.9 % 100 mL IVPB  Status:  Discontinued        1 g 200 mL/hr over 30 Minutes Intravenous Every 24 hours 10/24/20 0705 10/24/20 1514   10/23/20 2300  vancomycin (VANCOCIN) 2,500 mg in sodium chloride 0.9 % 500 mL IVPB        2,500 mg 250 mL/hr over 120 Minutes Intravenous  Once 10/23/20 2203 10/24/20 0245   10/23/20 2215  ceFEPIme (MAXIPIME) 2 g in sodium chloride 0.9 % 100 mL IVPB        2 g 200 mL/hr over 30 Minutes Intravenous  Once 10/23/20 2201 10/23/20 2353   10/23/20 2215  vancomycin (VANCOCIN) IVPB 1000 mg/200 mL premix  Status:  Discontinued        1,000 mg 200 mL/hr over 60 Minutes Intravenous  Once 10/23/20 2201 10/23/20 2203   10/23/20 2215  metroNIDAZOLE (FLAGYL) tablet 500 mg  Status:  Discontinued        500 mg Oral Every 8 hours 10/23/20 2201 10/24/20 0742       Time spent: 25 minutes-Greater than 50% of this time  was spent in counseling, explanation of diagnosis, planning of further management, and coordination of care.  MEDICATIONS: Scheduled Meds: . allopurinol  300 mg Oral Daily  . apixaban  5 mg Oral BID  . carvedilol  25 mg Oral BID WC  . diltiazem  360 mg Oral Daily  . levothyroxine  25 mcg Oral Q0600  . polyethylene glycol  17 g Oral BID  . potassium chloride  40 mEq Oral Q4H  . [START ON 10/28/2020] potassium chloride  40 mEq Oral Daily  . senna-docusate  2 tablet Oral QHS  . spironolactone  25 mg Oral Daily  . tamsulosin  0.4 mg Oral Daily   Continuous Infusions: . cefTRIAXone (ROCEPHIN)  IV 2 g (10/27/20 0904)   PRN Meds:.acetaminophen **OR** acetaminophen, bisacodyl   PHYSICAL EXAM: Vital signs: Vitals:   10/27/20 0155 10/27/20 0200 10/27/20 0537 10/27/20 0751  BP: (!) 147/69 (!) 142/68 139/70 138/69  Pulse: 66 62 66 71  Resp: 18 18 18  20  Temp: 97.8 F (36.6 C) 97.8 F (36.6 C) 97.8 F (36.6 C) 98 F (36.7 C)  TempSrc: Oral Oral Oral Oral  SpO2: 95% 97% 96%   Weight:      Height:       Filed Weights   10/23/20 2154  Weight: (!) 140.6 kg   Body mass index is 48.55 kg/m.   Gen Exam:Alert awake-not in any distress HEENT:atraumatic, normocephalic Chest: B/L clear to auscultation anteriorly CVS:S1S2 regular Abdomen:soft non tender, non distended Extremities:+++edema Neurology: Non focal Skin: no rash  I have personally reviewed following labs and imaging studies  LABORATORY DATA: CBC: Recent Labs  Lab 10/23/20 2201 10/24/20 0751 10/25/20 0613 10/26/20 0525 10/27/20 0635  WBC 14.9* 22.4* 21.7* 18.1* 11.3*  NEUTROABS 13.7*  --   --   --   --   HGB 11.9* 12.6* 10.3* 11.2* 11.1*  HCT 38.5* 43.6 33.6* 36.1* 36.2*  MCV 78.1* 81.6 76.7* 76.0* 75.3*  PLT 221 156 196 214 841    Basic Metabolic Panel: Recent Labs  Lab 10/23/20 2201 10/24/20 0751 10/25/20 0613 10/26/20 0525 10/27/20 0635  NA 140 139 137 139 138  K 3.7 4.5 3.5 3.7 3.1*  CL 105 104  101 100 100  CO2 24 24 26 26 27   GLUCOSE 92 88 147* 119* 177*  BUN 20 20 26* 28* 27*  CREATININE 1.70* 1.84* 1.98* 1.76* 1.59*  CALCIUM 9.1 8.7* 8.5* 8.6* 8.4*  MG  --   --   --   --  2.0    GFR: Estimated Creatinine Clearance: 51.1 mL/min (A) (by C-G formula based on SCr of 1.59 mg/dL (H)).  Liver Function Tests: Recent Labs  Lab 10/23/20 2201 10/24/20 0751 10/26/20 0525  AST 22 27 35  ALT 14 13 16   ALKPHOS 53 50 57  BILITOT 0.8 1.0 0.5  PROT 7.3 7.3 7.2  ALBUMIN 3.1* 2.8* 2.7*   No results for input(s): LIPASE, AMYLASE in the last 168 hours. No results for input(s): AMMONIA in the last 168 hours.  Coagulation Profile: Recent Labs  Lab 10/23/20 2201  INR 1.3*    Cardiac Enzymes: No results for input(s): CKTOTAL, CKMB, CKMBINDEX, TROPONINI in the last 168 hours.  BNP (last 3 results) No results for input(s): PROBNP in the last 8760 hours.  Lipid Profile: No results for input(s): CHOL, HDL, LDLCALC, TRIG, CHOLHDL, LDLDIRECT in the last 72 hours.  Thyroid Function Tests: No results for input(s): TSH, T4TOTAL, FREET4, T3FREE, THYROIDAB in the last 72 hours.  Anemia Panel: No results for input(s): VITAMINB12, FOLATE, FERRITIN, TIBC, IRON, RETICCTPCT in the last 72 hours.  Urine analysis:    Component Value Date/Time   COLORURINE AMBER (A) 10/24/2020 0423   APPEARANCEUR CLOUDY (A) 10/24/2020 0423   APPEARANCEUR Cloudy (A) 08/21/2020 1530   LABSPEC 1.015 10/24/2020 0423   PHURINE 6.0 10/24/2020 0423   GLUCOSEU NEGATIVE 10/24/2020 0423   HGBUR LARGE (A) 10/24/2020 0423   BILIRUBINUR NEGATIVE 10/24/2020 0423   BILIRUBINUR Negative 08/21/2020 1530   KETONESUR NEGATIVE 10/24/2020 0423   PROTEINUR 100 (A) 10/24/2020 0423   NITRITE NEGATIVE 10/24/2020 0423   LEUKOCYTESUR LARGE (A) 10/24/2020 0423    Sepsis Labs: Lactic Acid, Venous    Component Value Date/Time   LATICACIDVEN 1.8 10/24/2020 0130    MICROBIOLOGY: Recent Results (from the past 240 hour(s))   Blood Culture (routine x 2)     Status: None (Preliminary result)   Collection Time: 10/23/20 10:58 PM   Specimen: BLOOD  Result  Value Ref Range Status   Specimen Description BLOOD LEFT ANTECUBITAL  Final   Special Requests   Final    BOTTLES DRAWN AEROBIC AND ANAEROBIC Blood Culture adequate volume   Culture   Final    NO GROWTH 3 DAYS Performed at [facility], [address] [place], [place] [address]    Report Status PENDING  Incomplete  Resp Panel by RT-PCR (Flu A&B, Covid) Nasopharyngeal Swab     Status: None   Collection Time: 10/23/20 10:58 PM   Specimen: Nasopharyngeal Swab; Nasopharyngeal(NP) swabs in vial transport medium  Result Value Ref Range Status   SARS Coronavirus 2 by RT PCR NEGATIVE NEGATIVE Final    Comment: (NOTE) SARS-CoV-2 target nucleic acids are NOT DETECTED.  The SARS-CoV-2 RNA is generally detectable in upper respiratory specimens during the acute phase of infection. The lowest concentration of SARS-CoV-2 viral copies this assay can detect is 138 copies/mL. A negative result does not preclude SARS-Cov-2 infection and should not be used as the sole basis for treatment or other patient management decisions. A negative result may occur with  improper specimen collection/handling, submission of specimen other than nasopharyngeal swab, presence of viral mutation(s) within the areas targeted by this assay, and inadequate number of viral copies(<138 copies/mL). A negative result must be combined with clinical observations, patient history, and epidemiological information. The expected result is Negative.  Fact Sheet for Patients:  EntrepreneurPulse.com.au  Fact Sheet for Healthcare Providers:  IncredibleEmployment.be  This test is no t yet approved or cleared by the [place] FDA and  has been authorized for detection and/or diagnosis of SARS-CoV-2 by FDA under an Emergency Use Authorization (EUA). This EUA  will remain  in effect (meaning this test can be used) for the duration of the COVID-19 declaration under Section 564(b)(1) of the Act, 21 U.S.C.section 360bbb-3(b)(1), unless the authorization is terminated  or revoked sooner.       Influenza A by PCR NEGATIVE NEGATIVE Final   Influenza B by PCR NEGATIVE NEGATIVE Final    Comment: (NOTE) The Xpert Xpress SARS-CoV-2/FLU/RSV plus assay is intended as an aid in the diagnosis of influenza from Nasopharyngeal swab specimens and should not be used as a sole basis for treatment. Nasal washings and aspirates are unacceptable for Xpert Xpress SARS-CoV-2/FLU/RSV testing.  Fact Sheet for Patients: EntrepreneurPulse.com.au  Fact Sheet for Healthcare Providers: IncredibleEmployment.be  This test is not yet approved or cleared by the [place] FDA and has been authorized for detection and/or diagnosis of SARS-CoV-2 by FDA under an Emergency Use Authorization (EUA). This EUA will remain in effect (meaning this test can be used) for the duration of the COVID-19 declaration under Section 564(b)(1) of the Act, 21 U.S.C. section 360bbb-3(b)(1), unless the authorization is terminated or revoked.  Performed at [facility], [place] [address], [place], [place] [address]   Blood Culture (routine x 2)     Status: None (Preliminary result)   Collection Time: 10/23/20 11:05 PM   Specimen: BLOOD  Result Value Ref Range Status   Specimen Description BLOOD RIGHT ANTECUBITAL  Final   Special Requests   Final    BOTTLES DRAWN AEROBIC AND ANAEROBIC Blood Culture results may not be optimal due to an inadequate volume of blood received in culture bottles   Culture   Final    NO GROWTH 3 DAYS Performed at [facility], [place] [address] [place], [place] [address]    Report Status PENDING  Incomplete  Urine  culture     Status: Abnormal   Collection Time: 10/24/20  4:23 AM   Specimen: Urine, Clean Catch   Result Value Ref Range Status   Specimen Description URINE, CLEAN CATCH  Final   Special Requests NONE  Final   Culture (A)  Final    <10,000 COLONIES/mL INSIGNIFICANT GROWTH Performed at [facility], [address] [place], [place] [address]    Report Status 10/25/2020 FINAL  Final    RADIOLOGY STUDIES/RESULTS: No results found.   LOS: 3 days   Oren Binet, MD  [facility]    To contact the attending provider between 7A-7P or the covering provider during after hours 7P-7A, please log into the web site [email or web address] and access using universal [place] password for that web site. If you do not have the password, please call the hospital operator.  10/27/2020, 1:22 PM

## 2020-10-27 NOTE — Progress Notes (Signed)
Physical Therapy Treatment Patient Details Name: Terry [name] MRN: [id number] DOB: 04/13/1941 Today's Date: 10/27/2020    History of Present Illness 79 y.o male presenting with generalized weakness. Found to have sepsis with fever, tachycardia, tachypnea and elevated white blood cell count with urinalysis positive for UTI. PMH includes  paroxysmal A. Fib, HFpEF, HTN, CKD 3, DMT2 diet controlled, hypothyroidism, prostate cancer    PT Comments    Pt agreeable to SNF to increase independence with functional mobility. Pt able to participate in session, limited by fatigue. Pt agreeable to get into recliner. Pt requiring mod A for supine>sit and min A for sit<>stand. Pt continues to demonstrate deficits in balance, strength, coordination, gait, endurance and safety and will benefit from skilled PT to address deficits to maximize independence with functional mobility prior to discharge.    Follow Up Recommendations  SNF     Equipment Recommendations  None recommended by PT    Recommendations for Other Services       Precautions / Restrictions Precautions Precautions: Fall Precaution Comments: urine incontinence Restrictions Weight Bearing Restrictions: No Other Position/Activity Restrictions: bil Unna boots    Mobility  Bed Mobility Overal bed mobility: Needs Assistance Bed Mobility: Supine to Sit     Supine to sit: Mod assist;HOB elevated        Transfers   Equipment used: Rolling walker (2 wheeled) Transfers: Sit to/from Stand Sit to Stand: Min assist;From elevated surface            Ambulation/Gait Ambulation/Gait assistance: Herbalist (Feet): 6 Feet Assistive device: Rolling walker (2 wheeled);1 person hand held assist Gait Pattern/deviations: Step-through pattern;Wide base of support;Trunk flexed;Decreased stride length         Stairs             Wheelchair Mobility    Modified Rankin (Stroke Patients Only)       Balance                                             Cognition                                              Exercises General Exercises - Lower Extremity Long Arc Quad: AROM;Strengthening;Both;10 reps;Seated Hip ABduction/ADduction: AROM;Strengthening;Both;10 reps;Seated Hip Flexion/Marching: AROM;Strengthening;Both;10 reps;Seated    General Comments        Pertinent Vitals/Pain Pain Assessment: No/denies pain    Home Living                      Prior Function            PT Goals (current goals can now be found in the care plan section) Acute Rehab PT Goals Patient Stated Goal: return home PT Goal Formulation: With patient Time For Goal Achievement: 11/08/20 Potential to Achieve Goals: Good Progress towards PT goals: Progressing toward goals    Frequency    Min 2X/week      PT Plan Discharge plan needs to be updated;Frequency needs to be updated    Co-evaluation              AM-PAC PT "6 Clicks" Mobility   Outcome Measure  Help needed turning from your back to your side while in a  flat bed without using bedrails?: A Little Help needed moving from lying on your back to sitting on the side of a flat bed without using bedrails?: A Lot Help needed moving to and from a bed to a chair (including a wheelchair)?: A Little Help needed standing up from a chair using your arms (e.g., wheelchair or bedside chair)?: A Little Help needed to walk in hospital room?: A Little Help needed climbing 3-5 steps with a railing? : A Lot 6 Click Score: 16    End of Session Equipment Utilized During Treatment: Gait belt Activity Tolerance: Patient tolerated treatment well Patient left: in chair;with call bell/phone within reach;with chair alarm set Nurse Communication: Mobility status PT Visit Diagnosis: Unsteadiness on feet (R26.81);Muscle weakness (generalized) (M62.81);Difficulty in walking, not elsewhere classified (R26.2)     Time:  4128-7867 PT Time Calculation (min) (ACUTE ONLY): 11 min  Charges:  $Therapeutic Exercise: 8-22 mins                     Lyanne Co, DPT Acute Rehabilitation Services [phone]   [place] 10/27/2020, 1:38 PM

## 2020-10-27 NOTE — Progress Notes (Signed)
Tele called pt ran non sustained VT. Vital signs stable heart rate 68. Paged Dr. Marlowe Sax made aware. Labs already in for this AM. No new orders.

## 2020-10-27 NOTE — Plan of Care (Signed)
  Problem: Activity: Goal: Capacity to carry out activities will improve Outcome: Progressing   Problem: Fluid Volume: Goal: Hemodynamic stability will improve Outcome: Progressing   Problem: Clinical Measurements: Goal: Diagnostic test results will improve Outcome: Progressing Goal: Signs and symptoms of infection will decrease Outcome: Progressing   Problem: Respiratory: Goal: Ability to maintain adequate ventilation will improve Outcome: Progressing

## 2020-10-27 NOTE — Progress Notes (Signed)
*  PRELIMINARY RESULTS* Echocardiogram 2D Echocardiogram with definity has been performed.  Leavy Cella 10/27/2020, 3:51 PM

## 2020-10-27 NOTE — NC FL2 (Signed)
[place] MEDICAID FL2 LEVEL OF CARE SCREENING TOOL     IDENTIFICATION  Patient Name: Terry [name] Birthdate: 09-02-1941 Sex: male Admission Date (Current Location): 10/23/2020  [facility] and [place] Number:  Herbalist and Address:  [facility], [place] [address] [place],  [address]      Provider Number: [id number]  Attending Physician Name and Address:  Jonetta Osgood, MD  Relative Name and Phone Number:  [name] [phone]    Current Level of Care: Hospital Recommended Level of Care: [place] Prior Approval Number:    Date Approved/Denied:   PASRR Number: [id number]  Discharge Plan: SNF    Current Diagnoses: Patient Active Problem List   Diagnosis Date Noted  . Sepsis ([place]) 10/24/2020  . Complicated UTI (urinary tract infection) 10/24/2020  . Chronic heart failure with preserved ejection fraction (HFpEF) ([place]) 10/24/2020  . CHF, chronic ([place])   . Prostate cancer ([place])   . Congestive heart failure ([place]) 07/02/2020  . Macrocytic anemia 06/30/2020  . Elevated PSA 06/30/2020  . Cellulitis of lower extremity 06/30/2020  . Prediabetes 01/04/2019  . Paroxysmal atrial fibrillation ([place]) 01/01/2019  . Risk for falls 12/27/2017  . 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 Right ureteral stone 10/01/2017  . Iron deficiency anemia due to chronic blood loss 06/22/2016  . 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  . 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  . Depression with anxiety 06/19/2015  . CKD (chronic kidney disease) stage 3, GFR 30-59 ml/min (HCC) 05/26/2015  . Essential hypertension 11/01/2014  . Gouty arthritis of toe 11/01/2014  . Morbid obesity due to excess calories ([place]) 11/01/2014  . Primary osteoarthritis of both knees 11/01/2014    Orientation RESPIRATION BLADDER Height & Weight     Self,Time,Situation,Place  Normal Continent,External catheter (External Urinary Catheter) Weight: (!) 310 lb (140.6  kg) Height:  5\' 7"  (170.2 cm)  BEHAVIORAL SYMPTOMS/MOOD NEUROLOGICAL BOWEL NUTRITION STATUS      Continent Diet (See Discharge Summary)  AMBULATORY STATUS COMMUNICATION OF NEEDS Skin   Limited Assist Verbally Normal                       Personal Care Assistance Level of Assistance  Bathing,Feeding,Dressing Bathing Assistance: Maximum assistance Feeding assistance: Independent Dressing Assistance: Maximum assistance     Functional Limitations Info  Sight,Hearing,Speech Sight Info: Adequate Hearing Info: Adequate Speech Info: Adequate    SPECIAL CARE FACTORS FREQUENCY  PT (By licensed PT),OT (By licensed OT)     PT Frequency: 5x min weekly OT Frequency: 5x min weekly            Contractures Contractures Info: Not present    Additional Factors Info  Code Status,Allergies Code Status Info: FULL Allergies Info: Lisinopril           Current Medications (10/27/2020):  This is the current hospital active medication list Current Facility-Administered Medications  Medication Dose Route Frequency Provider Last Rate Last Admin  . acetaminophen (TYLENOL) tablet 650 mg  650 mg Oral Q6H PRN Chotiner, Yevonne Aline, MD   650 mg at 10/24/20 1459   Or  . acetaminophen (TYLENOL) suppository 650 mg  650 mg Rectal Q6H PRN Chotiner, Yevonne Aline, MD      . allopurinol (ZYLOPRIM) tablet 300 mg  300 mg Oral Daily Chotiner, Yevonne Aline, MD   300 mg at 10/27/20 0824  . apixaban (ELIQUIS) tablet 5 mg  5 mg Oral BID Chotiner, Yevonne Aline, MD  5 mg at 10/27/20 0824  . bisacodyl (DULCOLAX) suppository 10 mg  10 mg Rectal Daily PRN Jonetta Osgood, MD      . carvedilol (COREG) tablet 25 mg  25 mg Oral BID WC Chotiner, Yevonne Aline, MD   25 mg at 10/27/20 0824  . cefTRIAXone (ROCEPHIN) 2 g in sodium chloride 0.9 % 100 mL IVPB  2 g Intravenous Q24H Jonetta Osgood, MD 200 mL/hr at 10/27/20 0904 2 g at 10/27/20 0904  . COVID-19 mRNA vaccine (Pfizer) injection 0.3 mL  0.3 mL Intramuscular Once  Jonetta Osgood, MD      . diltiazem (CARDIZEM CD) 24 hr capsule 360 mg  360 mg Oral Daily Almyra Deforest, [place]   360 mg at 10/27/20 0824  . levothyroxine (SYNTHROID) tablet 25 mcg  25 mcg Oral Q0600 Jonetta Osgood, MD   25 mcg at 10/27/20 0545  . polyethylene glycol (MIRALAX / GLYCOLAX) packet 17 g  17 g Oral BID Jonetta Osgood, MD   17 g at 10/27/20 0824  . potassium chloride SA (KLOR-CON) CR tablet 40 mEq  40 mEq Oral Q4H Jonetta Osgood, MD   40 mEq at 10/27/20 1205  . [START ON 10/28/2020] potassium chloride SA (KLOR-CON) CR tablet 40 mEq  40 mEq Oral Daily Ghimire, Henreitta Leber, MD      . senna-docusate (Senokot-S) tablet 2 tablet  2 tablet Oral QHS Jonetta Osgood, MD   2 tablet at 10/26/20 2147  . spironolactone (ALDACTONE) tablet 25 mg  25 mg Oral Daily Geralynn Rile, MD   25 mg at 10/27/20 1205  . tamsulosin (FLOMAX) capsule 0.4 mg  0.4 mg Oral Daily Chotiner, Yevonne Aline, MD   0.4 mg at 10/27/20 8937     Discharge Medications: Please see discharge summary for a list of discharge medications.  Relevant Imaging Results:  Relevant Lab Results:   Additional Information SSN-[id number]  Trula Ore, LCSWA

## 2020-10-28 LAB — BASIC METABOLIC PANEL
Anion gap: 9 (ref 5–15)
BUN: 26 mg/dL — ABNORMAL HIGH (ref 8–23)
CO2: 27 mmol/L (ref 22–32)
Calcium: 8.7 mg/dL — ABNORMAL LOW (ref 8.9–10.3)
Chloride: 102 mmol/L (ref 98–111)
Creatinine, Ser: 1.6 mg/dL — ABNORMAL HIGH (ref 0.61–1.24)
GFR, Estimated: 44 mL/min — ABNORMAL LOW (ref >60)
Glucose, Bld: 165 mg/dL — ABNORMAL HIGH (ref 70–99)
Potassium: 3.6 mmol/L (ref 3.5–5.1)
Sodium: 138 mmol/L (ref 135–145)

## 2020-10-28 LAB — GLUCOSE, CAPILLARY
Glucose-Capillary: 98 mg/dL (ref 70–99)
Glucose-Capillary: 118 mg/dL — ABNORMAL HIGH (ref 70–99)

## 2020-10-28 LAB — MAGNESIUM: Magnesium: 2.1 mg/dL (ref 1.7–2.4)

## 2020-10-28 MED ORDER — SENNOSIDES-DOCUSATE SODIUM 8.6-50 MG PO TABS: 2.0000 | ORAL_TABLET | Freq: Every evening | ORAL | 0 refills | Status: DC | PRN

## 2020-10-28 MED ORDER — SPIRONOLACTONE 25 MG PO TABS: 25.0000 mg | ORAL_TABLET | Freq: Every day | ORAL | Status: DC

## 2020-10-28 MED ORDER — TELMISARTAN 40 MG PO TABS: 40.0000 mg | ORAL_TABLET | Freq: Every day | ORAL | 0 refills | Status: DC

## 2020-10-28 MED ORDER — SPIRONOLACTONE 25 MG PO TABS: 25.0000 mg | ORAL_TABLET | Freq: Every day | ORAL | 0 refills | Status: DC

## 2020-10-28 MED ORDER — TORSEMIDE 20 MG PO TABS
40.0000 mg | ORAL_TABLET | Freq: Every day | ORAL | Status: DC
  Administered 2020-10-28: 40 mg via ORAL
  Filled 2020-10-28: qty 2

## 2020-10-28 MED ORDER — TORSEMIDE 20 MG PO TABS: 40.0000 mg | ORAL_TABLET | Freq: Every day | ORAL | 0 refills | Status: DC

## 2020-10-28 MED ORDER — CEPHALEXIN 500 MG PO CAPS
500.0000 mg | ORAL_CAPSULE | Freq: Two times a day (BID) | ORAL | 0 refills | Status: AC
Start: 2020-10-28 — End: 2020-10-29

## 2020-10-28 MED ORDER — POLYETHYLENE GLYCOL 3350 17 G PO PACK: 17.0000 g | PACK | Freq: Every day | ORAL | 0 refills | Status: DC | PRN

## 2020-10-28 NOTE — Discharge Summary (Signed)
PATIENT DETAILS Name: Terry [name] Age: 79 y.o. Sex: male Date of Birth: May 04, 1941 MRN: [id number]. Admitting Physician: Eben Burow, MD [id number], [name], MD  Admit Date: 10/23/2020 Discharge date: 10/28/2020  Recommendations for Outpatient Follow-up:  1. Follow up with PCP in 1-2 weeks 2. Please obtain CMP/CBC in one week 3. Please ensure follow-up with cardiology  Admitted From:  Home  Disposition: Home with home health services (Refused SNF)   Home Health: Yes  Equipment/Devices: None  Discharge Condition: Stable  CODE STATUS: FULL CODE  Diet recommendation:  Diet Order            Diet - low sodium heart healthy           Diet Heart Room service appropriate? Yes; Fluid consistency: Thin  Diet effective now                   Brief Narrative: Patient is a 79 y.o. male PAF on anticoagulation, chronic diastolic heart failure, HTN, CKD stage IIIb, DM-2, prostate cancer, hypothyroidism, chronic bilateral lower extremity lymphedema-who presented with fever and generalized weakness-found to have complicated UTI, AKI-started on IV antibiotics and admitted to the hospitalist service.  See below for further details  Significant events: 12/9>> admit to [facility] for fever/weakness-complicated UTI, AKI 93/73>> A. fib with RVR  Significant studies: 12/10>> CT abdomen/pelvis: Chronic hydronephrosis/urothelial thickening of the right-stable stent.  History of prostate cancer with stable pelvic adenopathy 12/10>> chest x-ray: No pneumonia  Antimicrobial therapy: Vancomycin: 12/9 x 1  Flagyl: 12/9 >> 12/10  Cefepime: 12/9 x 1  Ceftriaxone: 12/10 >>  Microbiology data: 12/9>> blood culture: No growth 12/10>> urine culture:<10,000 colnies/ml  Procedures : None  Consults: Cardiology  Brief Hospital Course: Sepsis due to complicated UTI: Sepsis physiology has resolved-leukocytosis continues to downtrend-surprisingly all cultures are negative  (may have been sterile-as patient may have gotten antimicrobial therapy).  Significant improvement after starting empiric antimicrobial therapy-transition to Keflex for discharge.    AKI on CKD stage IIIb: AKI likely hemodynamically mediated-in the setting of sepsis/losartan/Lasix use-remains volume overloaded (has chronic lymphedema at baseline-worsened due to IV fluid resuscitation)-renal function slowly improving-continue on diuretics on discharge.   PAF with RVR: RVR provoked by acute infection-required Cardizem infusion for rate control-maintaining sinus rhythm-no longer on Cardizem-rate stable on Coreg-continue Eliquis on discharge.  HTN: BP controlled with Coreg, Aldactone, Demadex and ARB.  Follow with primary cardiology/PCP on discharge.  Acute on chronic diastolic heart failure/bilateral lower extremity lymphedema: Has chronic bilateral lower extremity lymphedema at baseline-some worsening of volume status due to IV fluid resuscitation-is noncompliant with Lasix in the outpatient setting-lower extremities wrapped with compression stockings-management IV Lasix-with significant improvement in volume status.  Cardiology recommending transitioning to [facility] today-they will arrange for follow-up with outpatient primary cardiologist..  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  Metastatic prostate cancer: Resume outpatient follow-up with oncology/urology  BPH: Continue Flomax  Hypothyroidism: Continue Synthroid  Gout: Continue allopurinol  Constipation: Resolved after MiraLAX/senna.  Morbid Obesity: Estimated body mass index is 48.55 kg/m as calculated from the following:   Height as of this encounter: 5\' 7"  (1.702 m).   Weight as of this encounter: 140.6 kg.    Discharge Diagnoses:  Principal  Problem:   Sepsis ([place]) Active Problems:   CKD (chronic kidney disease) stage 3, GFR 30-59 ml/min (HCC)   Essential hypertension   Morbid  obesity due to excess calories (HCC)   Paroxysmal atrial fibrillation (HCC)   Complicated UTI (urinary tract infection)   Chronic heart failure with preserved ejection fraction (HFpEF) ([place])   Discharge Instructions:  Activity:  As tolerated with Full fall precautions use walker/cane & assistance as needed  Discharge Instructions    (HEART FAILURE PATIENTS) Call MD:  Anytime you have any of the following symptoms: 1) 3 pound weight gain in 24 hours or 5 pounds in 1 week 2) shortness of breath, with or without a dry hacking cough 3) swelling in the hands, feet or stomach 4) if you have to sleep on extra pillows at night in order to breathe.   Complete by: As directed    Call MD for:  difficulty breathing, headache or visual disturbances   Complete by: As directed    Diet - low sodium heart healthy   Complete by: As directed    Discharge instructions   Complete by: As directed    Follow with Primary MD  Susy Frizzle, MD in 1-2 weeks  Please follow-up with your primary cardiologist in 1 week  Please get a complete blood count and chemistry panel checked by your Primary MD at your next visit, and again as instructed by your Primary MD.  Get Medicines reviewed and adjusted: Please take all your medications with you for your next visit with your Primary MD  Laboratory/radiological data: Please request your Primary MD to go over all hospital tests and procedure/radiological results at the follow up, please ask your Primary MD to get all Hospital records sent to his/her office.  In some cases, they will be blood work, cultures and biopsy results pending at the time of your discharge. Please request that your primary care M.D. follows up on these results.  Also Note the following: If you experience worsening of your admission symptoms, develop  shortness of breath, life threatening emergency, suicidal or homicidal thoughts you must seek medical attention immediately by calling 911 or calling your MD immediately  if symptoms less severe.  You must read complete instructions/literature along with all the possible adverse reactions/side effects for all the Medicines you take and that have been prescribed to you. Take any new Medicines after you have completely understood and accpet all the possible adverse reactions/side effects.   Do not drive when taking Pain medications or sleeping medications (Benzodaizepines)  Do not take more than prescribed Pain, Sleep and Anxiety Medications. It is not advisable to combine anxiety,sleep and pain medications without talking with your primary care practitioner  Special Instructions: If you have smoked or chewed Tobacco  in the last 2 yrs please stop smoking, stop any regular Alcohol  and or any Recreational drug use.  Wear Seat belts while driving.  Please note: You were cared for by a hospitalist during your hospital stay. Once you are discharged, your primary care physician will handle any further medical issues. Please note that NO REFILLS for any discharge medications will be authorized once you are discharged, as it is imperative that you return to your primary care physician (or establish a relationship with a primary care physician if you do not have one) for your post hospital discharge needs so that they can reassess your need for medications and monitor your lab values.   Increase activity slowly   Complete by: As directed      Allergies as of 10/28/2020      Reactions   Lisinopril    FACIAL SWELLING  Medication List    STOP taking these medications   furosemide 40 MG tablet Commonly known as: LASIX   ibuprofen 200 MG tablet Commonly known as: ADVIL   traMADol 50 MG tablet Commonly known as: ULTRAM     TAKE these medications   allopurinol 300 MG tablet Commonly known  as: ZYLOPRIM Take 1 tablet (300 mg total) by mouth daily.   apixaban 5 MG Tabs tablet Commonly known as: Eliquis Take 1 tablet (5 mg total) by mouth 2 (two) times daily.   carvedilol 25 MG tablet Commonly known as: COREG Take 1 tablet (25 mg total) by mouth 2 (two) times daily with a meal.   cephALEXin 500 MG capsule Commonly known as: Keflex Take 1 capsule (500 mg total) by mouth 2 (two) times daily for 1 day.   fish oil-omega-3 fatty acids 1000 MG capsule Take 2 g by mouth daily.   levothyroxine 25 MCG tablet Commonly known as: SYNTHROID Take 25 mcg by mouth daily before breakfast.   multivitamins ther. w/minerals Tabs tablet Take 1 tablet by mouth daily.   polyethylene glycol 17 g packet Commonly known as: MIRALAX / GLYCOLAX Take 17 g by mouth daily as needed for mild constipation.   senna-docusate 8.6-50 MG tablet Commonly known as: Senokot-S Take 2 tablets by mouth at bedtime as needed for mild constipation.   spironolactone 25 MG tablet Commonly known as: ALDACTONE Take 1 tablet (25 mg total) by mouth daily.   tamsulosin 0.4 MG Caps capsule Commonly known as: FLOMAX Take 0.4 mg by mouth daily.   telmisartan 40 MG tablet Commonly known as: MICARDIS Take 1 tablet (40 mg total) by mouth daily. Start taking on: October 29, 2020   torsemide 20 MG tablet Commonly known as: DEMADEX Take 2 tablets (40 mg total) by mouth daily.       Allergies  Allergen Reactions  . Lisinopril     FACIAL SWELLING     Other Procedures/Studies: CT ABDOMEN PELVIS WO CONTRAST  Result Date: 10/24/2020 CLINICAL DATA:  Acute nonlocalized abdominal pain. EXAM: CT ABDOMEN AND PELVIS WITHOUT CONTRAST TECHNIQUE: Multidetector CT imaging of the abdomen and pelvis was performed following the standard protocol without IV contrast. COMPARISON:  08/21/2020 FINDINGS: Lower chest:  No contributory findings. Hepatobiliary: No focal liver abnormality.No evidence of biliary obstruction or  stone. Pancreas: Unremarkable. Spleen: Unremarkable. Adrenals/Urinary Tract: Negative adrenals. Stented right kidney with severe scarring and multifocal lower pole moiety calculi. There are superimposed renal cysts. The intrarenal collecting system is similarly patulous to prior. A stone is seen in the upper ureter adjacent to the stent, 7 mm in diameter. Small left upper and lower pole renal calculi. Unremarkable bladder. Stomach/Bowel: No obstruction. No appendicitis. Bowel anastomosis in the left abdomen. Vascular/Lymphatic: No acute vascular abnormality. Stable mild enlargement of pelvic lymph nodes measuring up to 14 mm short axis at the right node of [place] [employer]:Symmetric enlargement of the prostate. There is a reported history of prostate cancer on prior imaging. Other: No ascites or pneumoperitoneum. Musculoskeletal: No acute abnormalities. Scar-like appearance in the right gluteal region. There are a few sclerotic areas most notably seen in the L5 left body. Bone scan was performed October 2021. 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 degenerative disease. IMPRESSION: 1. No acute finding or change from October 2021. 2. Chronic hydronephrosis and urothelial thickening on the right with stable stent positioning. Multiple right renal and ureteral calculi. 3. Small left renal calculi. 4. History of prostate cancer with stable pelvic adenopathy. Electronically  Signed   By: Monte Fantasia M.D.   On: 10/24/2020 04:53   DG Chest 1 View  Result Date: 10/24/2020 CLINICAL DATA:  Weakness. EXAM: CHEST  1 VIEW COMPARISON:  May 01, 2006 FINDINGS: There is no evidence of acute infiltrate, pleural effusion or pneumothorax. Mild suprahilar and infrahilar prominence of the pulmonary vasculature is seen. The cardiac silhouette is mildly enlarged. Degenerative changes seen throughout the thoracic spine. IMPRESSION: Mild cardiomegaly with mild pulmonary vascular congestion. Electronically  Signed   By: Virgina Norfolk M.D.   On: 10/24/2020 00:12   DG Chest Port 1 View  Result Date: 10/24/2020 CLINICAL DATA:  Weakness. EXAM: PORTABLE CHEST 1 VIEW COMPARISON:  October 23, 2020. FINDINGS: Stable mild cardiomegaly is noted with probable central pulmonary vascular congestion. No pneumothorax or pleural effusion is noted. Both lungs are clear. The visualized skeletal structures are unremarkable. IMPRESSION: Stable mild cardiomegaly with probable central pulmonary vascular congestion. Electronically Signed   By: Marijo Conception M.D.   On: 10/24/2020 14:47   ECHOCARDIOGRAM COMPLETE  Result Date: 10/27/2020    ECHOCARDIOGRAM REPORT   Patient Name:   Terry [name] Date of Exam: 10/27/2020 Medical Rec #:  [id number]     Height:       67.0 in Accession #:    [id number]    Weight:       310.0 lb Date of Birth:  February 22, 1941     BSA:          2.436 m Patient Age:    17 years      BP:           138/69 mmHg Patient Gender: M             HR:           71 bpm. Exam Location:  Inpatient Procedure: 2D Echo Indications:    CHF-Acute Diastolic 151.76 / H60.73  History:        Patient has prior history of Echocardiogram examinations, most                 recent 07/29/2020. CHF, Arrythmias:Atrial Fibrillation; Risk                 Factors:Non-Smoker and Hypertension. Prostate Cancer, Sepsis.  Sonographer:    Leavy Cella Referring Phys: [id number] [place]  1. Left ventricular ejection fraction, by estimation, is 55 to 60%. The left ventricle has normal function. The left ventricle has no regional wall motion abnormalities. There is mild left ventricular hypertrophy. Left ventricular diastolic parameters are consistent with Grade I diastolic dysfunction (impaired relaxation).  2. Peak RV-RA gradient 30 mmHg. Right ventricular systolic function is normal. The right ventricular size is mildly enlarged.  3. Left atrial size was moderately dilated.  4. The mitral valve is normal in structure. No  evidence of mitral valve regurgitation. No evidence of mitral stenosis.  5. The aortic valve is tricuspid. Aortic valve regurgitation is trivial. Mild aortic valve sclerosis is present, with no evidence of aortic valve stenosis. FINDINGS  Left Ventricle: Left ventricular ejection fraction, by estimation, is 55 to 60%. The left ventricle has normal function. The left ventricle has no regional wall motion abnormalities. The left ventricular internal cavity size was normal in size. There is  mild left ventricular hypertrophy. Left ventricular diastolic parameters are consistent with Grade I diastolic dysfunction (impaired relaxation). Right Ventricle: Peak RV-RA gradient 30 mmHg. The right ventricular size is mildly enlarged. No increase in right ventricular wall  thickness. Right ventricular systolic function is normal. Left Atrium: Left atrial size was moderately dilated. Right Atrium: Right atrial size was normal in size. Pericardium: There is no evidence of pericardial effusion. Mitral Valve: The mitral valve is normal in structure. Mild mitral annular calcification. No evidence of mitral valve regurgitation. No evidence of mitral valve stenosis. Tricuspid Valve: The tricuspid valve is normal in structure. Tricuspid valve regurgitation is trivial. Aortic Valve: The aortic valve is tricuspid. Aortic valve regurgitation is trivial. Mild aortic valve sclerosis is present, with no evidence of aortic valve stenosis. Pulmonic Valve: The pulmonic valve was normal in structure. Pulmonic valve regurgitation is not visualized. Aorta: The aortic root is normal in size and structure. Venous: The inferior vena cava was not well visualized. IAS/Shunts: No atrial level shunt detected by color flow Doppler.  LEFT VENTRICLE PLAX 2D LVIDd:         4.75 cm  Diastology LVIDs:         2.50 cm  LV e' medial:    7.40 cm/s LV PW:         1.50 cm  LV E/e' medial:  14.6 LV IVS:        1.05 cm  LV e' lateral:   8.70 cm/s LVOT diam:     1.80  cm  LV E/e' lateral: 12.4 LVOT Area:     2.54 cm  RIGHT VENTRICLE TAPSE (M-mode): 3.3 cm LEFT ATRIUM              Index       RIGHT ATRIUM           Index LA diam:        5.70 cm  2.34 cm/m  RA Area:     17.40 cm LA Vol (A2C):   90.1 ml  36.98 ml/m RA Volume:   49.70 ml  20.40 ml/m LA Vol (A4C):   105.0 ml 43.10 ml/m LA Biplane Vol: 102.0 ml 41.87 ml/m   AORTA Ao Root diam: 2.60 cm MITRAL VALVE                TRICUSPID VALVE MV Area (PHT): 3.39 cm     TR Peak grad:   30.0 mmHg MV Decel Time: 224 msec     TR Vmax:        274.00 cm/s MV E velocity: 108.00 cm/s MV A velocity: 108.00 cm/s  SHUNTS MV E/A ratio:  1.00         Systemic Diam: 1.80 cm Loralie Champagne MD Electronically signed by Loralie Champagne MD Signature Date/Time: 10/27/2020/5:03:26 PM    Final      TODAY-DAY OF DISCHARGE:  Subjective:   Terry [name] today has no headache,no chest abdominal pain,no new weakness tingling or numbness, feels much better wants to go home today.   Objective:   Blood pressure 140/69, pulse 63, temperature (!) 97.3 F (36.3 C), temperature source Oral, resp. rate 18, height 5\' 7"  (1.702 m), weight 133.4 kg, SpO2 98 %.  Intake/Output Summary (Last 24 hours) at 10/28/2020 1016 Last data filed at 10/27/2020 2330 Gross per 24 hour  Intake 730 ml  Output 1950 ml  Net -1220 ml   Filed Weights   10/23/20 2154 10/28/20 0500  Weight: (!) 140.6 kg 133.4 kg    Exam: Awake Alert, Oriented *3, No new F.N deficits, Normal affect [place].AT,PERRAL Supple Neck,No JVD, No cervical lymphadenopathy appriciated.  Symmetrical Chest wall movement, Good air movement bilaterally, CTAB RRR,No Gallops,Rubs or new Murmurs, No Parasternal  Heave +ve B.Sounds, Abd Soft, Non tender, No organomegaly appriciated, No rebound -guarding or rigidity. No Cyanosis, Clubbing or edema, No new Rash or bruise   PERTINENT RADIOLOGIC STUDIES: ECHOCARDIOGRAM COMPLETE  Result Date: 10/27/2020    ECHOCARDIOGRAM REPORT   Patient Name:    Terry [name] Date of Exam: 10/27/2020 Medical Rec #:  [id number]     Height:       67.0 in Accession #:    [id number]    Weight:       310.0 lb Date of Birth:  21-Mar-1941     BSA:          2.436 m Patient Age:    31 years      BP:           138/69 mmHg Patient Gender: M             HR:           71 bpm. Exam Location:  Inpatient Procedure: 2D Echo Indications:    CHF-Acute Diastolic 160.10 / X32.35  History:        Patient has prior history of Echocardiogram examinations, most                 recent 07/29/2020. CHF, Arrythmias:Atrial Fibrillation; Risk                 Factors:Non-Smoker and Hypertension. Prostate Cancer, Sepsis.  Sonographer:    Leavy Cella Referring Phys: [id number] [place]  1. Left ventricular ejection fraction, by estimation, is 55 to 60%. The left ventricle has normal function. The left ventricle has no regional wall motion abnormalities. There is mild left ventricular hypertrophy. Left ventricular diastolic parameters are consistent with Grade I diastolic dysfunction (impaired relaxation).  2. Peak RV-RA gradient 30 mmHg. Right ventricular systolic function is normal. The right ventricular size is mildly enlarged.  3. Left atrial size was moderately dilated.  4. The mitral valve is normal in structure. No evidence of mitral valve regurgitation. No evidence of mitral stenosis.  5. The aortic valve is tricuspid. Aortic valve regurgitation is trivial. Mild aortic valve sclerosis is present, with no evidence of aortic valve stenosis. FINDINGS  Left Ventricle: Left ventricular ejection fraction, by estimation, is 55 to 60%. The left ventricle has normal function. The left ventricle has no regional wall motion abnormalities. The left ventricular internal cavity size was normal in size. There is  mild left ventricular hypertrophy. Left ventricular diastolic parameters are consistent with Grade I diastolic dysfunction (impaired relaxation). Right Ventricle: Peak RV-RA gradient  30 mmHg. The right ventricular size is mildly enlarged. No increase in right ventricular wall thickness. Right ventricular systolic function is normal. Left Atrium: Left atrial size was moderately dilated. Right Atrium: Right atrial size was normal in size. Pericardium: There is no evidence of pericardial effusion. Mitral Valve: The mitral valve is normal in structure. Mild mitral annular calcification. No evidence of mitral valve regurgitation. No evidence of mitral valve stenosis. Tricuspid Valve: The tricuspid valve is normal in structure. Tricuspid valve regurgitation is trivial. Aortic Valve: The aortic valve is tricuspid. Aortic valve regurgitation is trivial. Mild aortic valve sclerosis is present, with no evidence of aortic valve stenosis. Pulmonic Valve: The pulmonic valve was normal in structure. Pulmonic valve regurgitation is not visualized. Aorta: The aortic root is normal in size and structure. Venous: The inferior vena cava was not well visualized. IAS/Shunts: No atrial level shunt detected by color flow Doppler.  LEFT VENTRICLE PLAX 2D LVIDd:  4.75 cm  Diastology LVIDs:         2.50 cm  LV e' medial:    7.40 cm/s LV PW:         1.50 cm  LV E/e' medial:  14.6 LV IVS:        1.05 cm  LV e' lateral:   8.70 cm/s LVOT diam:     1.80 cm  LV E/e' lateral: 12.4 LVOT Area:     2.54 cm  RIGHT VENTRICLE TAPSE (M-mode): 3.3 cm LEFT ATRIUM              Index       RIGHT ATRIUM           Index LA diam:        5.70 cm  2.34 cm/m  RA Area:     17.40 cm LA Vol (A2C):   90.1 ml  36.98 ml/m RA Volume:   49.70 ml  20.40 ml/m LA Vol (A4C):   105.0 ml 43.10 ml/m LA Biplane Vol: 102.0 ml 41.87 ml/m   AORTA Ao Root diam: 2.60 cm MITRAL VALVE                TRICUSPID VALVE MV Area (PHT): 3.39 cm     TR Peak grad:   30.0 mmHg MV Decel Time: 224 msec     TR Vmax:        274.00 cm/s MV E velocity: 108.00 cm/s MV A velocity: 108.00 cm/s  SHUNTS MV E/A ratio:  1.00         Systemic Diam: 1.80 cm Loralie Champagne MD  Electronically signed by Loralie Champagne MD Signature Date/Time: 10/27/2020/5:03:26 PM    Final      PERTINENT LAB RESULTS: CBC: Recent Labs    10/26/20 0525 10/27/20 0635  WBC 18.1* 11.3*  HGB 11.2* 11.1*  HCT 36.1* 36.2*  PLT 214 208   CMET CMP     Component Value Date/Time   NA 138 10/28/2020 0328   K 3.6 10/28/2020 0328   CL 102 10/28/2020 0328   CO2 27 10/28/2020 0328   GLUCOSE 165 (H) 10/28/2020 0328   BUN 26 (H) 10/28/2020 0328   CREATININE 1.60 (H) 10/28/2020 0328   CREATININE 1.39 (H) 09/12/2020 1431   CALCIUM 8.7 (L) 10/28/2020 0328   PROT 7.2 10/26/2020 0525   ALBUMIN 2.7 (L) 10/26/2020 0525   AST 35 10/26/2020 0525   ALT 16 10/26/2020 0525   ALKPHOS 57 10/26/2020 0525   BILITOT 0.5 10/26/2020 0525   GFRNONAA 44 (L) 10/28/2020 0328   GFRNONAA 48 (L) 09/12/2020 1431   GFRAA 55 (L) 09/12/2020 1431    GFR Estimated Creatinine Clearance: 49.2 mL/min (A) (by C-G formula based on SCr of 1.6 mg/dL (H)). No results for input(s): LIPASE, AMYLASE in the last 72 hours. No results for input(s): CKTOTAL, CKMB, CKMBINDEX, TROPONINI in the last 72 hours. Invalid input(s): POCBNP No results for input(s): DDIMER in the last 72 hours. No results for input(s): HGBA1C in the last 72 hours. No results for input(s): CHOL, HDL, LDLCALC, TRIG, CHOLHDL, LDLDIRECT in the last 72 hours. No results for input(s): TSH, T4TOTAL, T3FREE, THYROIDAB in the last 72 hours.  Invalid input(s): FREET3 No results for input(s): VITAMINB12, FOLATE, FERRITIN, TIBC, IRON, RETICCTPCT in the last 72 hours. Coags: No results for input(s): INR in the last 72 hours.  Invalid input(s): PT Microbiology: Recent Results (from the past 240 hour(s))  Blood Culture (routine x 2)  Status: None (Preliminary result)   Collection Time: 10/23/20 10:58 PM   Specimen: BLOOD  Result Value Ref Range Status   Specimen Description BLOOD LEFT ANTECUBITAL  Final   Special Requests   Final    BOTTLES DRAWN  AEROBIC AND ANAEROBIC Blood Culture adequate volume   Culture   Final    NO GROWTH 3 DAYS Performed at [facility], [address], [place], [place] [address]    Report Status PENDING  Incomplete  Resp Panel by RT-PCR (Flu A&B, Covid) Nasopharyngeal Swab     Status: None   Collection Time: 10/23/20 10:58 PM   Specimen: Nasopharyngeal Swab; Nasopharyngeal(NP) swabs in vial transport medium  Result Value Ref Range Status   SARS Coronavirus 2 by RT PCR NEGATIVE NEGATIVE Final    Comment: (NOTE) SARS-CoV-2 target nucleic acids are NOT DETECTED.  The SARS-CoV-2 RNA is generally detectable in upper respiratory specimens during the acute phase of infection. The lowest concentration of SARS-CoV-2 viral copies this assay can detect is 138 copies/mL. A negative result does not preclude SARS-Cov-2 infection and should not be used as the sole basis for treatment or other patient management decisions. A negative result may occur with  improper specimen collection/handling, submission of specimen other than nasopharyngeal swab, presence of viral mutation(s) within the areas targeted by this assay, and inadequate number of viral copies(<138 copies/mL). A negative result must be combined with clinical observations, patient history, and epidemiological information. The expected result is Negative.  Fact Sheet for Patients:  EntrepreneurPulse.com.au  Fact Sheet for Healthcare Providers:  IncredibleEmployment.be  This test is no t yet approved or cleared by the [place] FDA and  has been authorized for detection and/or diagnosis of SARS-CoV-2 by FDA under an Emergency Use Authorization (EUA). This EUA will remain  in effect (meaning this test can be used) for the duration of the COVID-19 declaration under Section 564(b)(1) of the Act, 21 U.S.C.section 360bbb-3(b)(1), unless the authorization is terminated  or revoked sooner.       Influenza A by  PCR NEGATIVE NEGATIVE Final   Influenza B by PCR NEGATIVE NEGATIVE Final    Comment: (NOTE) The Xpert Xpress SARS-CoV-2/FLU/RSV plus assay is intended as an aid in the diagnosis of influenza from Nasopharyngeal swab specimens and should not be used as a sole basis for treatment. Nasal washings and aspirates are unacceptable for Xpert Xpress SARS-CoV-2/FLU/RSV testing.  Fact Sheet for Patients: EntrepreneurPulse.com.au  Fact Sheet for Healthcare Providers: IncredibleEmployment.be  This test is not yet approved or cleared by the [place] FDA and has been authorized for detection and/or diagnosis of SARS-CoV-2 by FDA under an Emergency Use Authorization (EUA). This EUA will remain in effect (meaning this test can be used) for the duration of the COVID-19 declaration under Section 564(b)(1) of the Act, 21 U.S.C. section 360bbb-3(b)(1), unless the authorization is terminated or revoked.  Performed at [facility], [place] [address] [place], [place] [address]   Blood Culture (routine x 2)     Status: None (Preliminary result)   Collection Time: 10/23/20 11:05 PM   Specimen: BLOOD  Result Value Ref Range Status   Specimen Description BLOOD RIGHT ANTECUBITAL  Final   Special Requests   Final    BOTTLES DRAWN AEROBIC AND ANAEROBIC Blood Culture results may not be optimal due to an inadequate volume of blood received in culture bottles   Culture   Final    NO GROWTH 3 DAYS Performed at [facility]  [facility], [address] [place], [place] [address]    Report Status PENDING  Incomplete  Urine culture     Status: Abnormal   Collection Time: 10/24/20  4:23 AM   Specimen: Urine, Clean Catch  Result Value Ref Range Status   Specimen Description URINE, CLEAN CATCH  Final   Special Requests NONE  Final   Culture (A)  Final    <10,000 COLONIES/mL INSIGNIFICANT GROWTH Performed at [facility], [place] [address] [place], [place] [address]     Report Status 10/25/2020 FINAL  Final    FURTHER DISCHARGE INSTRUCTIONS:  Get Medicines reviewed and adjusted: Please take all your medications with you for your next visit with your Primary MD  Laboratory/radiological data: Please request your Primary MD to go over all hospital tests and procedure/radiological results at the follow up, please ask your Primary MD to get all Hospital records sent to his/her office.  In some cases, they will be blood work, cultures and biopsy results pending at the time of your discharge. Please request that your primary care M.D. goes through all the records of your hospital data and follows up on these results.  Also Note the following: If you experience worsening of your admission symptoms, develop shortness of breath, life threatening emergency, suicidal or homicidal thoughts you must seek medical attention immediately by calling 911 or calling your MD immediately  if symptoms less severe.  You must read complete instructions/literature along with all the possible adverse reactions/side effects for all the Medicines you take and that have been prescribed to you. Take any new Medicines after you have completely understood and accpet all the possible adverse reactions/side effects.   Do not drive when taking Pain medications or sleeping medications (Benzodaizepines)  Do not take more than prescribed Pain, Sleep and Anxiety Medications. It is not advisable to combine anxiety,sleep and pain medications without talking with your primary care practitioner  Special Instructions: If you have smoked or chewed Tobacco  in the last 2 yrs please stop smoking, stop any regular Alcohol  and or any Recreational drug use.  Wear Seat belts while driving.  Please note: You were cared for by a hospitalist during your hospital stay. Once you are discharged, your primary care physician will handle any further medical issues. Please note that NO REFILLS for any discharge  medications will be authorized once you are discharged, as it is imperative that you return to your primary care physician (or establish a relationship with a primary care physician if you do not have one) for your post hospital discharge needs so that they can reassess your need for medications and monitor your lab values.  Total Time spent coordinating discharge including counseling, education and face to face time equals 35 minutes.  SignedOren Binet 10/28/2020 10:16 AM

## 2020-10-28 NOTE — Progress Notes (Signed)
Cardiology Progress Note  Patient ID: Terry [name] MRN: [id number] DOB: [birth year] Date of Encounter: 10/28/2020  Primary Cardiologist: Ida Rogue, MD  Subjective   Chief Complaint: Breathing improved.  HPI: Good urine output.  Breathing improved.  Echo unchanged from prior.  ROS:  All other ROS reviewed and negative. Pertinent positives noted in the HPI.     Inpatient Medications  Scheduled Meds: . allopurinol  300 mg Oral Daily  . apixaban  5 mg Oral BID  . carvedilol  25 mg Oral BID WC  . diltiazem  360 mg Oral Daily  . levothyroxine  25 mcg Oral Q0600  . polyethylene glycol  17 g Oral BID  . potassium chloride  40 mEq Oral Daily  . senna-docusate  2 tablet Oral QHS  . spironolactone  25 mg Oral Daily  . tamsulosin  0.4 mg Oral Daily   Continuous Infusions: . cefTRIAXone (ROCEPHIN)  IV 2 g (10/28/20 0918)   PRN Meds: acetaminophen **OR** acetaminophen, bisacodyl   Vital Signs   Vitals:   10/27/20 1800 10/27/20 2100 10/28/20 0500 10/28/20 0742  BP: 126/86 134/64 136/78 140/69  Pulse: 72 71 66 63  Resp: 18 18 18 18   Temp: 99 F (37.2 C) 98.5 F (36.9 C) 98.1 F (36.7 C) (!) 97.3 F (36.3 C)  TempSrc: Oral Oral Oral Oral  SpO2:  96% 98% 98%  Weight:   133.4 kg   Height:        Intake/Output Summary (Last 24 hours) at 10/28/2020 0926 Last data filed at 10/27/2020 2330 Gross per 24 hour  Intake 730 ml  Output 1950 ml  Net -1220 ml   Last 3 Weights 10/28/2020 10/23/2020 10/20/2020  Weight (lbs) 294 lb 1.5 oz 310 lb 312 lb  Weight (kg) 133.4 kg 140.615 kg 141.522 kg      Telemetry  Overnight telemetry shows sinus rhythm in the 60s, which I personally reviewed.    Physical Exam   Vitals:   10/27/20 1800 10/27/20 2100 10/28/20 0500 10/28/20 0742  BP: 126/86 134/64 136/78 140/69  Pulse: 72 71 66 63  Resp: 18 18 18 18   Temp: 99 F (37.2 C) 98.5 F (36.9 C) 98.1 F (36.7 C) (!) 97.3 F (36.3 C)  TempSrc: Oral Oral Oral Oral  SpO2:  96% 98%  98%  Weight:   133.4 kg   Height:         Intake/Output Summary (Last 24 hours) at 10/28/2020 0926 Last data filed at 10/27/2020 2330 Gross per 24 hour  Intake 730 ml  Output 1950 ml  Net -1220 ml    Last 3 Weights 10/28/2020 10/23/2020 10/20/2020  Weight (lbs) 294 lb 1.5 oz 310 lb 312 lb  Weight (kg) 133.4 kg 140.615 kg 141.522 kg    Body mass index is 46.06 kg/m.  General: Well nourished, well developed, in no acute distress Head: Atraumatic, normal size  Eyes: PEERLA, EOMI  Neck: Supple, no JVD Endocrine: No thryomegaly Cardiac: Normal S1, S2; RRR; no murmurs, rubs, or gallops Lungs: Clear to auscultation bilaterally, no wheezing, rhonchi or rales  Abd: Soft, nontender, no hepatomegaly  Ext: No edema, pulses 2+ Musculoskeletal: No deformities, BUE and BLE strength normal and equal Skin: Warm and dry, no rashes   Neuro: Alert and oriented to person, place, time, and situation, CNII-XII grossly intact, no focal deficits  Psych: Normal mood and affect   Labs  High Sensitivity Troponin:  No results for input(s): TROPONINIHS in the last 720 hours.  Cardiac EnzymesNo results for input(s): TROPONINI in the last 168 hours. No results for input(s): TROPIPOC in the last 168 hours.  Chemistry Recent Labs  Lab 10/23/20 2201 10/24/20 0751 10/25/20 0613 10/26/20 0525 10/27/20 0635 10/27/20 1256 10/28/20 0328  NA 140 139   < > 139 138 137 138  K 3.7 4.5   < > 3.7 3.1* 4.0 3.6  CL 105 104   < > 100 100 99 102  CO2 24 24   < > 26 27 25 27   GLUCOSE 92 88   < > 119* 177* 97 165*  BUN 20 20   < > 28* 27* 27* 26*  CREATININE 1.70* 1.84*   < > 1.76* 1.59* 1.21 1.60*  CALCIUM 9.1 8.7*   < > 8.6* 8.4* 8.8* 8.7*  PROT 7.3 7.3  --  7.2  --   --   --   ALBUMIN 3.1* 2.8*  --  2.7*  --   --   --   AST 22 27  --  35  --   --   --   ALT 14 13  --  16  --   --   --   ALKPHOS 53 50  --  57  --   --   --   BILITOT 0.8 1.0  --  0.5  --   --   --   GFRNONAA 41* 37*   < > 39* 44* >60 44*   ANIONGAP 11 11   < > 13 11 13 9    < > = values in this interval not displayed.    Hematology Recent Labs  Lab 10/25/20 [phone] 10/26/20 0525 10/27/20 0635  WBC 21.7* 18.1* 11.3*  RBC 4.38 4.75 4.81  HGB 10.3* 11.2* 11.1*  HCT 33.6* 36.1* 36.2*  MCV 76.7* 76.0* 75.3*  MCH 23.5* 23.6* 23.1*  MCHC 30.7 31.0 30.7  RDW 18.6* 18.6* 18.8*  PLT 196 214 208   BNPNo results for input(s): BNP, PROBNP in the last 168 hours.  DDimer No results for input(s): DDIMER in the last 168 hours.   Radiology  ECHOCARDIOGRAM COMPLETE  Result Date: 10/27/2020    ECHOCARDIOGRAM REPORT   Patient Name:   PRESTIN MUNCH Date of Exam: 10/27/2020 Medical Rec #:  [id number]     Height:       67.0 in Accession #:    [id number]    Weight:       310.0 lb Date of Birth:  December 19, 1940     BSA:          2.436 m Patient Age:    79 years      BP:           138/69 mmHg Patient Gender: M             HR:           71 bpm. Exam Location:  Inpatient Procedure: 2D Echo Indications:    CHF-Acute Diastolic 009.38 / H82.99  History:        Patient has prior history of Echocardiogram examinations, most                 recent 07/29/2020. CHF, Arrythmias:Atrial Fibrillation; Risk                 Factors:Non-Smoker and Hypertension. Prostate Cancer, Sepsis.  Sonographer:    Leavy Cella Referring Phys: [id number] [place]  1. Left ventricular ejection fraction, by estimation, is 55 to 60%. The  left ventricle has normal function. The left ventricle has no regional wall motion abnormalities. There is mild left ventricular hypertrophy. Left ventricular diastolic parameters are consistent with Grade I diastolic dysfunction (impaired relaxation).  2. Peak RV-RA gradient 30 mmHg. Right ventricular systolic function is normal. The right ventricular size is mildly enlarged.  3. Left atrial size was moderately dilated.  4. The mitral valve is normal in structure. No evidence of mitral valve regurgitation. No evidence of mitral  stenosis.  5. The aortic valve is tricuspid. Aortic valve regurgitation is trivial. Mild aortic valve sclerosis is present, with no evidence of aortic valve stenosis. FINDINGS  Left Ventricle: Left ventricular ejection fraction, by estimation, is 55 to 60%. The left ventricle has normal function. The left ventricle has no regional wall motion abnormalities. The left ventricular internal cavity size was normal in size. There is  mild left ventricular hypertrophy. Left ventricular diastolic parameters are consistent with Grade I diastolic dysfunction (impaired relaxation). Right Ventricle: Peak RV-RA gradient 30 mmHg. The right ventricular size is mildly enlarged. No increase in right ventricular wall thickness. Right ventricular systolic function is normal. Left Atrium: Left atrial size was moderately dilated. Right Atrium: Right atrial size was normal in size. Pericardium: There is no evidence of pericardial effusion. Mitral Valve: The mitral valve is normal in structure. Mild mitral annular calcification. No evidence of mitral valve regurgitation. No evidence of mitral valve stenosis. Tricuspid Valve: The tricuspid valve is normal in structure. Tricuspid valve regurgitation is trivial. Aortic Valve: The aortic valve is tricuspid. Aortic valve regurgitation is trivial. Mild aortic valve sclerosis is present, with no evidence of aortic valve stenosis. Pulmonic Valve: The pulmonic valve was normal in structure. Pulmonic valve regurgitation is not visualized. Aorta: The aortic root is normal in size and structure. Venous: The inferior vena cava was not well visualized. IAS/Shunts: No atrial level shunt detected by color flow Doppler.  LEFT VENTRICLE PLAX 2D LVIDd:         4.75 cm  Diastology LVIDs:         2.50 cm  LV e' medial:    7.40 cm/s LV PW:         1.50 cm  LV E/e' medial:  14.6 LV IVS:        1.05 cm  LV e' lateral:   8.70 cm/s LVOT diam:     1.80 cm  LV E/e' lateral: 12.4 LVOT Area:     2.54 cm  RIGHT  VENTRICLE TAPSE (M-mode): 3.3 cm LEFT ATRIUM              Index       RIGHT ATRIUM           Index LA diam:        5.70 cm  2.34 cm/m  RA Area:     17.40 cm LA Vol (A2C):   90.1 ml  36.98 ml/m RA Volume:   49.70 ml  20.40 ml/m LA Vol (A4C):   105.0 ml 43.10 ml/m LA Biplane Vol: 102.0 ml 41.87 ml/m   AORTA Ao Root diam: 2.60 cm MITRAL VALVE                TRICUSPID VALVE MV Area (PHT): 3.39 cm     TR Peak grad:   30.0 mmHg MV Decel Time: 224 msec     TR Vmax:        274.00 cm/s MV E velocity: 108.00 cm/s MV A velocity: 108.00 cm/s  SHUNTS MV E/A  ratio:  1.00         Systemic Diam: 1.80 cm Loralie Champagne MD Electronically signed by Loralie Champagne MD Signature Date/Time: 10/27/2020/5:03:26 PM    Final     Cardiac Studies  TTE 10/27/2020 1. Left ventricular ejection fraction, by estimation, is 55 to 60%. The  left ventricle has normal function. The left ventricle has no regional  wall motion abnormalities. There is mild left ventricular hypertrophy.  Left ventricular diastolic parameters  are consistent with Grade I diastolic dysfunction (impaired relaxation).  2. Peak RV-RA gradient 30 mmHg. Right ventricular systolic function is  normal. The right ventricular size is mildly enlarged.  3. Left atrial size was moderately dilated.  4. The mitral valve is normal in structure. No evidence of mitral valve  regurgitation. No evidence of mitral stenosis.  5. The aortic valve is tricuspid. Aortic valve regurgitation is trivial.  Mild aortic valve sclerosis is present, with no evidence of aortic valve  stenosis.   Patient Profile  79 year old male with history of paroxysmal atrial fibrillation on Eliquis, diastolic heart failure, obesity, hypertension, CKD stage III who was admitted on 10/24/2020 with weakness and found to have sepsis secondary to urinary source.  Course been complicated by diastolic heart failure and paroxysmal atrial fibrillation.   Assessment & Plan   1.  Paroxysmal A.  Fib -Driven by sepsis.  Back in sinus rhythm. -I have stopped his Cardizem.  Continue Coreg 25 mg twice a day. -On Eliquis 5 mg twice daily  2.  Acute on chronic diastolic heart failure -Net -12.4 L.  Creatinine has reached its nadir. -Transition to 40 mg of torsemide daily today. -Would recommend to start home ARB likely tomorrow. -Would continue Coreg and Aldactone at discharge for blood pressure control. -Can consider SGLT2 inhibitor per primary cardiologist  3.  Hypertension -Restart ARB likely tomorrow -We will continue Coreg and Aldactone at discharge for blood pressure control.  I think the addition of the ARB would be good for him.  For questions or updates, please contact [place] Please consult [email or web address] for contact info under   Time Spent with Patient: I have spent a total of 25 minutes with patient reviewing hospital notes, telemetry, EKGs, labs and examining the patient as well as establishing an assessment and plan that was discussed with the patient.  > 50% of time was spent in direct patient care.    Signed, Addison Naegeli. Audie Box, [place]  10/28/2020 9:26 AM

## 2020-10-28 NOTE — Plan of Care (Addendum)
  Problem: Education: Goal: Knowledge of General Education information will improve Description: Including pain rating scale, medication(s)/side effects and non-pharmacologic comfort measures Outcome: Progressing   Problem: Health Behavior/Discharge Planning: Goal: Ability to manage health-related needs will improve Outcome: Progressing   Problem: Clinical Measurements: Goal: Ability to maintain clinical measurements within normal limits will improve Outcome: Progressing   Problem: Clinical Measurements: Goal: Will remain free from infection Outcome: Progressing   Problem: Clinical Measurements: Goal: Diagnostic test results will improve Outcome: Progressing    pt educated on purpose and importance of continued use of medications. pt states he doesn't take due to having to urinate often when takine medication. Nurse educated patient on importance of taking his medication regimen as orered and being compliant. pt verbalized an undersanding and states "I'll do better".

## 2020-10-28 NOTE — TOC Transition Note (Signed)
Transition of Care (TOC) - CM/SW Discharge Note Marvetta Gibbons RN, BSN Transitions of Care Unit 4E- RN Case Manager See Treatment Team for direct phone # Cross coverage for [place]   Patient Details  Name: Terry [name] MRN: [id number] Date of Birth: Aug 04, 1941  Transition of Care [facility]) CM/SW Contact:  [name], RN Phone Number: 10/28/2020, 4:37 PM   Clinical Narrative:    Pt stable for transition home today, per MD daughter has decided to take pt home with [facility] and has declined SNF placement. Orders for HHRN/PT/OT/aide placed- per MD daughter states she has DME.  Call made to daughter [name] via TC to offer choice and confirm plans, per [name] her sister will transport pt home, choice offered Per CMS guidelines from medicare.gov website with star ratings (copy placed in shadow chart)- per [name] she does not have a preference for [facility] agency just request that agency has aide services available.  Calls made to multiple [place] agencies-  [facility]- unable to take pt back [facility]- [facility]- Unable to accept due to staffing [place] - pending- but likely a delay in start of care [facility]- delay for start of care until end of next week [facility]- able to accept insurance however do not have aide- no delay in start of care- spoke with [name] who is agreeable to services with [facility]- they will see pt tomorrow for start of care and work with family for [facility] resources if pt eligible.   Final next level of care: [place] Barriers to Discharge: Barriers Resolved   Patient Goals and CMS Choice Patient states their goals for this hospitalization and ongoing recovery are:: return home with [facility] CMS Medicare.gov Compare Post Acute Care list provided to:: Patient Choice offered to / list presented to : Adult Children  Discharge Placement                 Home with [facility]      Discharge Plan and Services In-house Referral: Clinical Social Work Discharge  Planning Services: CM Consult Post Acute Care Choice: Home Health          DME Arranged: N/A DME Agency: NA       HH Arranged: RN,PT,OT,Nurse's Aide [place] Agency: [facility] Date [place]: 10/28/20 Time [place]: 1400 Representative spoke with at [facility]: Amy  Social Determinants of Health ([place]) Interventions     Readmission Risk Interventions No flowsheet data found.

## 2020-10-28 NOTE — Discharge Instructions (Signed)
Sepsis, Self Care, Adult Sepsis is a serious illness that may require intensive care in the hospital. The following information explains what you need to know in order to manage your condition after you are discharged from the hospital. What are the risks? After being treated for sepsis and discharged from the hospital, you may be at a higher risk for certain problems. These problems may be physical or mental. Physical problems:  Weakness and tiredness.  Shortness of breath.  Pain in many areas of the body.  Difficulty walking.  Dry, itchy skin.  Lack of appetite. This may lead to weight loss.  Organ failure. Mental problems:  Difficulty sleeping.  Depression.  Confusion.  Anxiety and worry caused by having gone through a bad experience (post-traumatic stress disorder,PTSD).  Low self-esteem. Follow these instructions at home: Medicines  Take over-the-counter and prescription medicines only as told by your health care provider.  If you were prescribed an antibiotic, antiviral, or antifungal medicine, take it as told by your health care provider. Do not stop taking the medicine even if you start to feel better. Eating and drinking   Eat a healthy diet that includes plenty of vegetables, fruits, whole grains, low-fat dairy products, and lean protein. Ask your health care provider if you should avoid certain foods.  Drink enough fluid to keep your urine pale yellow. Alcohol use  Do not drink alcohol if: ? Your health care provider tells you not to drink. ? You are pregnant, may be pregnant, or are planning to become pregnant.  If you drink alcohol, limit how much you use to: ? 0-1 drink a day for women. ? 0-2 drinks a day for men.  Be aware of how much alcohol is in your drink. In the [place] one drink equals one 12 oz bottle of beer (355 mL), one 5 oz glass of wine (148 mL), or one 1 oz glass of hard liquor (44 mL). Activity  Rest and gradually return to your  normal activities. Ask your health care provider what activities are safe for you.  Avoid sitting for a long time without moving. Get up to take short walks every 1-2 hours. This is important to improve blood flow and breathing. Ask for help if you feel weak or unsteady.  Try to set small, achievable goals each week, such as dressing yourself, bathing, or walking up the stairs. It may take a while to rebuild your strength.  Try to exercise regularly if you feel healthy enough to do so. Ask your health care provider what exercises are safe for you. Preventing infection   Keep your vaccinations up to date. Get the flu shot every year.  Wash your hands often using soap and water. Use hand sanitizer if soap and water are not available.  Practice good hygiene. Keep cuts clean and covered until healed. Managing stress Talk with your health care provider or counselor about ways to reduce stress. He or she may suggest:  Meditation, muscle relaxation, and breathing exercises.  Talk therapy.  Spending time on hobbies and activities that you enjoy. General instructions  Get the right amount and quality of sleep. Most adults need 7-9 hours of sleep each night. To help with sleep: ? Keep your bedroom cool and dark. ? Do not eat a heavy meal within one hour of bedtime. ? Do not drink alcohol or caffeinated drinks before bed. ? Avoid screen time, such as television, computers, tablets, or cell phones before bed.  Do not use any products  that contain nicotine or tobacco, such as cigarettes, e-cigarettes, and chewing tobacco. If you need help quitting, ask your health care provider.  Talk to trusted family members and friends about your condition. Explain your symptoms to them, and let them know that you are working with a health care provider to treat your condition. This can provide you with one way to get support and guidance.  Keep all follow-up visits as told by your health care provider. This  is important. Questions to ask your health care provider:  What physical and emotional changes do I need to report?  Do I need to have someone with me all the time?  Is it safe for me to drive? Contact a health care provider if you:  Do not feel like you are getting better or regaining strength.  Have muscle or joint pain.  Frequently feel tired.  Are having trouble coping with your recovery.  Have nightmares, or trouble falling asleep or staying asleep.  Feel sad, down, or depressed more often than not, every day for more than 2 weeks.  Have difficulty concentrating.  Feel irritable or you cry for no reason. Get help right away if you:  Have difficulty breathing.  Have a rapid or skipping heartbeat.  Become confused or disoriented.  See, hear, or feel things that do not exist (hallucinations).  Have a high fever.  Have an infection that is getting worse or not getting better.  You have thoughts of hurting yourself or others. If you ever feel like you may hurt yourself or others, or have thoughts about taking your own life, get help right away. You can go to your nearest emergency department or call:  Your local emergency services (911 in the [place]  A suicide crisis helpline, such as the [place] at (757) 800-8276. This is open 24 hours a day. Summary  Sepsis is a serious illness that may require intensive care in a hospital. You may experience long-term health effects after you are discharged from the hospital.  Try to set small, achievable goals each week, such as dressing yourself, bathing, or walking up the stairs. It may take a while to rebuild your strength.  Keep all follow-up visits as told by your health care provider. This is important.  Know what symptoms you should get help right away for. This information is not intended to replace advice given to you by your health care provider. Make sure you discuss any questions you  have with your health care provider. Document Revised: 06/09/2018 Document Reviewed: 06/09/2018 Elsevier Patient Education  [place].  Atrial Fibrillation  Atrial fibrillation is a type of heartbeat that is irregular or fast. If you have this condition, your heart beats without any order. This makes it hard for your heart to pump blood in a normal way. Atrial fibrillation may come and go, or it may become a long-lasting problem. If this condition is not treated, it can put you at higher risk for stroke, heart failure, and other heart problems. What are the causes? This condition may be caused by diseases that damage the heart. They include:  High blood pressure.  Heart failure.  Heart valve disease.  Heart surgery. Other causes include:  Diabetes.  Thyroid disease.  Being overweight.  Kidney disease. Sometimes the cause is not known. What increases the risk? You are more likely to develop this condition if:  You are older.  You smoke.  You exercise often and very hard.  You  have a family history of this condition.  You are a man.  You use drugs.  You drink a lot of alcohol.  You have lung conditions, such as emphysema, pneumonia, or COPD.  You have sleep apnea. What are the signs or symptoms? Common symptoms of this condition include:  A feeling that your heart is beating very fast.  Chest pain or discomfort.  Feeling short of breath.  Suddenly feeling light-headed or weak.  Getting tired easily during activity.  Fainting.  Sweating. In some cases, there are no symptoms. How is this treated? Treatment for this condition depends on underlying conditions and how you feel when you have atrial fibrillation. They include:  Medicines to: ? Prevent blood clots. ? Treat heart rate or heart rhythm problems.  Using devices, such as a pacemaker, to correct heart rhythm problems.  Doing surgery to remove the part of the heart that sends bad  signals.  Closing an area where clots can form in the heart (left atrial appendage). In some cases, your doctor will treat other underlying conditions. Follow these instructions at home: Medicines  Take over-the-counter and prescription medicines only as told by your doctor.  Do not take any new medicines without first talking to your doctor.  If you are taking blood thinners: ? Talk with your doctor before you take any medicines that have aspirin or NSAIDs, such as ibuprofen, in them. ? Take your medicine exactly as told by your doctor. Take it at the same time each day. ? Avoid activities that could hurt or bruise you. Follow instructions about how to prevent falls. ? Wear a bracelet that says you are taking blood thinners. Or, carry a card that lists what medicines you take. Lifestyle      Do not use any products that have nicotine or tobacco in them. These include cigarettes, e-cigarettes, and chewing tobacco. If you need help quitting, ask your doctor.  Eat heart-healthy foods. Talk with your doctor about the right eating plan for you.  Exercise regularly as told by your doctor.  Do not drink alcohol.  Lose weight if you are overweight.  Do not use drugs, including cannabis. General instructions  If you have a condition that causes breathing to stop for a short period of time (apnea), treat it as told by your doctor.  Keep a healthy weight. Do not use diet pills unless your doctor says they are safe for you. Diet pills may make heart problems worse.  Keep all follow-up visits as told by your doctor. This is important. Contact a doctor if:  You notice a change in the speed, rhythm, or strength of your heartbeat.  You are taking a blood-thinning medicine and you get more bruising.  You get tired more easily when you move or exercise.  You have a sudden change in weight. Get help right away if:   You have pain in your chest or your belly (abdomen).  You have  trouble breathing.  You have side effects of blood thinners, such as blood in your vomit, poop (stool), or pee (urine), or bleeding that cannot stop.  You have any signs of a stroke. "BE FAST" is an easy way to remember the main warning signs: ? B - Balance. Signs are dizziness, sudden trouble walking, or loss of balance. ? E - Eyes. Signs are trouble seeing or a change in how you see. ? F - Face. Signs are sudden weakness or loss of feeling in the face, or the face  or eyelid drooping on one side. ? A - Arms. Signs are weakness or loss of feeling in an arm. This happens suddenly and usually on one side of the body. ? S - Speech. Signs are sudden trouble speaking, slurred speech, or trouble understanding what people say. ? T - Time. Time to call emergency services. Write down what time symptoms started.  You have other signs of a stroke, such as: ? A sudden, very bad headache with no known cause. ? Feeling like you may vomit (nausea). ? Vomiting. ? A seizure. These symptoms may be an emergency. Do not wait to see if the symptoms will go away. Get medical help right away. Call your local emergency services (911 in the [place] Do not drive yourself to the hospital. Summary  Atrial fibrillation is a type of heartbeat that is irregular or fast.  You are at higher risk of this condition if you smoke, are older, have diabetes, or are overweight.  Follow your doctor's instructions about medicines, diet, exercise, and follow-up visits.  Get help right away if you have signs or symptoms of a stroke.  Get help right away if you cannot catch your breath, or you have chest pain or discomfort. This information is not intended to replace advice given to you by your health care provider. Make sure you discuss any questions you have with your health care provider. Document Revised: 04/25/2019 Document Reviewed: 04/25/2019 Elsevier Patient Education  [place].

## 2020-10-29 LAB — CULTURE, BLOOD (ROUTINE X 2)
Culture: NO GROWTH
Culture: NO GROWTH
Special Requests: ADEQUATE

## 2020-11-03 ENCOUNTER — Telehealth: Payer: Self-pay

## 2020-11-03 NOTE — Telephone Encounter (Signed)
Terry [name] from [facility] called with BP report BP- 172/91  Weight-304.9 Lbs  OT requesting  1 week 1 2 week 2 1 week 1

## 2020-11-12 ENCOUNTER — Telehealth: Payer: Self-pay | Admitting: Nurse Practitioner

## 2020-11-12 NOTE — Telephone Encounter (Signed)
Spoke with daughter to see if it was okay to change the time of the Palliative f/u visit scheduled on 12/04/20 from 10 to 10:30 AM and she was in agreement with this and she will let the patient know as well.

## 2020-11-19 ENCOUNTER — Telehealth: Payer: Self-pay | Admitting: *Deleted

## 2020-11-19 NOTE — Telephone Encounter (Signed)
Received call from [place], PT with [facility].  Reports that patient had unobserved fall on Monday 11/17/2020 while outside picking up debris. No injury noted.   Advised to continue to monitor.

## 2020-11-21 ENCOUNTER — Other Ambulatory Visit: Payer: Self-pay

## 2020-11-21 ENCOUNTER — Telehealth: Payer: Self-pay

## 2020-11-21 NOTE — Telephone Encounter (Signed)
Benefits investigation faxed for Eligard.

## 2020-11-26 ENCOUNTER — Other Ambulatory Visit: Payer: Self-pay

## 2020-11-26 ENCOUNTER — Encounter: Payer: Self-pay | Admitting: Family Medicine

## 2020-11-26 ENCOUNTER — Other Ambulatory Visit
Admission: RE | Admit: 2020-11-26 | Discharge: 2020-11-26 | Disposition: A | Discharge status: Home / Self Care | Payer: Medicare HMO | Source: Ambulatory Visit | Attending: Urology | Admitting: Urology

## 2020-11-26 ENCOUNTER — Encounter: Payer: Self-pay | Admitting: Urology

## 2020-11-26 ENCOUNTER — Ambulatory Visit (INDEPENDENT_AMBULATORY_CARE_PROVIDER_SITE_OTHER): Payer: Medicare HMO | Admitting: Urology

## 2020-11-26 VITALS — BP 193/99 | HR 55 | Ht 67.0 in | Wt 300.0 lb

## 2020-11-26 DIAGNOSIS — C61 Malignant neoplasm of prostate: Secondary | ICD-10-CM

## 2020-11-26 DIAGNOSIS — N1330 Unspecified hydronephrosis: Secondary | ICD-10-CM

## 2020-11-26 LAB — PSA: Prostatic Specific Antigen: 26.57 ng/mL — ABNORMAL HIGH (ref 0.00–4.00)

## 2020-11-26 NOTE — Patient Instructions (Signed)
Leuprolide injection What is this medicine? LEUPROLIDE (loo PROE lide) is a man-made hormone. It is used to treat the symptoms of prostate cancer. This medicine may also be used to treat children with early onset of puberty. It may be used for other hormonal conditions. This medicine may be used for other purposes; ask your health care provider or pharmacist if you have questions. COMMON BRAND NAME(S): Lupron What should I tell my health care provider before I take this medicine? They need to know if you have any of these conditions:  diabetes  heart disease or previous heart attack  high blood pressure  high cholesterol  pain or difficulty passing urine  spinal cord metastasis  stroke  tobacco smoker  an unusual or allergic reaction to leuprolide, benzyl alcohol, other medicines, foods, dyes, or preservatives  pregnant or trying to get pregnant  breast-feeding How should I use this medicine? This medicine is for injection under the skin or into a muscle. You will be taught how to prepare and give this medicine. Use exactly as directed. Take your medicine at regular intervals. Do not take your medicine more often than directed. It is important that you put your used needles and syringes in a special sharps container. Do not put them in a trash can. If you do not have a sharps container, call your pharmacist or healthcare provider to get one. A special MedGuide will be given to you by the pharmacist with each prescription and refill. Be sure to read this information carefully each time. Talk to your pediatrician regarding the use of this medicine in children. While this medicine may be prescribed for children as young as 8 years for selected conditions, precautions do apply. Overdosage: If you think you have taken too much of this medicine contact a poison control center or emergency room at once. NOTE: This medicine is only for you. Do not share this medicine with others. What if  I miss a dose? If you miss a dose, take it as soon as you can. If it is almost time for your next dose, take only that dose. Do not take double or extra doses. What may interact with this medicine? Do not take this medicine with any of the following medications:  chasteberry  cisapride  dronedarone  pimozide  thioridazine This medicine may also interact with the following medications:  herbal or dietary supplements, like black cohosh or DHEA  male hormones, like estrogens or progestins and birth control pills, patches, rings, or injections  male hormones, like testosterone  other medicines that prolong the QT interval (abnormal heart rhythm) This list may not describe all possible interactions. Give your health care provider a list of all the medicines, herbs, non-prescription drugs, or dietary supplements you use. Also tell them if you smoke, drink alcohol, or use illegal drugs. Some items may interact with your medicine. What should I watch for while using this medicine? Visit your doctor or health care professional for regular checks on your progress. During the first week, your symptoms may get worse, but then will improve as you continue your treatment. You may get hot flashes, increased bone pain, increased difficulty passing urine, or an aggravation of nerve symptoms. Discuss these effects with your doctor or health care professional, some of them may improve with continued use of this medicine. Male patients may experience a menstrual cycle or spotting during the first 2 months of therapy with this medicine. If this continues, contact your doctor or health care professional.   This medicine may increase blood sugar. Ask your healthcare provider if changes in diet or medicines are needed if you have diabetes. What side effects may I notice from receiving this medicine? Side effects that you should report to your doctor or health care professional as soon as possible:  allergic  reactions like skin rash, itching or hives, swelling of the face, lips, or tongue  breathing problems  chest pain  depression or memory disorders  pain in your legs or groin  pain at site where injected  severe headache  signs and symptoms of high blood sugar such as being more thirsty or hungry or having to urinate more than normal. You may also feel very tired or have blurry vision  swelling of the feet and legs  visual changes  vomiting Side effects that usually do not require medical attention (report to your doctor or health care professional if they continue or are bothersome):  breast swelling or tenderness  decrease in sex drive or performance  diarrhea  hot flashes  loss of appetite  muscle, joint, or bone pains  nausea  redness or irritation at site where injected  skin problems or acne This list may not describe all possible side effects. Call your doctor for medical advice about side effects. You may report side effects to FDA at 1-800-FDA-1088. Where should I keep my medicine? Keep out of the reach of children. Store below 25 degrees C (77 degrees F). Do not freeze. Protect from light. Do not use if it is not clear or if there are particles present. Throw away any unused medicine after the expiration date. NOTE: This sheet is a summary. It may not cover all possible information. If you have questions about this medicine, talk to your doctor, pharmacist, or health care provider.  2021 Elsevier/Gold Standard (2019-10-03 10:57:41)  

## 2020-11-26 NOTE — Progress Notes (Signed)
11/26/2020 4:15 PM   Terry [name] [birth year] [id number]  Reason for visit: Follow up metastatic prostate cancer, right ureteral stent/stone  HPI: He is a very comorbid 80 year old male who recently moved to the area from [place]. I originally met him on 07/30/2020. He is a very poor historian, and the majority of the history has been obtained from the outside records that we were able to obtain from [facility]. He has anextensive cardiac history and CHF, and is currently on Eliquis and high-dose diuretics.  He was hospitalized in November 2018 after being found down likely secondary to sepsis of urinary source with a 1 cm right mid ureteral stone, and underwent urgent ureteral stent placement with urology at that time.  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   He was also found to have an [place] some [place].He was last seen in May 2018 and was on ADT for metastatic prostate cancer(last dose May 2018,PSA was1.9). His prostate biopsy showed Gleason score 5+4 = 9 prostate adenocarcinoma with a 155 g prostate, and metastatic disease with sclerotic lesions in the pubic rami.PSA on 06/25/2020 was 32.9, and increased to 41.1 on 07/30/2020, both off of ADT.  At our last visit, he had opted to resume ADT and a 1 month loading dose of [place] Koller was given on 08/21/2020, but he did not follow-up.  Imaging at that time with CT abdomen pelvis with contrast and bone scan showed sclerotic bone lesions in the lower lumbar spine and pelvis suspicious for bone mets.  He has been resistant to see oncology in the past, as he was hiding his prostate cancer diagnosis from his family.  His family is now aware of his diagnosis, and he has more willing to see oncology.   I still think a more palliative approach is necessary, but I do think it is worth at least visiting with oncology to discuss other alternative options in addition to ADT.  PSA was drawn today.  We will obtain prior authorization for Eligard, and have him follow-up in the next 1 to 2 weeks to resume ADT.  He was recently hospitalized in mid December with a possible UTI and sepsis from urinary source, however urine and blood cultures were all negative.  He has had the right indwelling ureteral stent for the last 3 years with proximal ureteral stone and completely atrophic kidney.  I again had a very long conversation with him about options including observation, attempted stent exchange, or attempted ureteroscopy/stone removal/stent change.  I think there is very little benefit at this time with his completely atrophic right kidney, and the fact that he really has not had any culture documented UTIs.  He is high risk for any surgical procedure, but if he was developing recurrent culture documented UTIs we could consider an attempt at right ureteroscopy or stent change in the future.  He understands his comorbidities and that he is high risk for this procedure, and is in agreement with ongoing observation at this time.  -Consider trial of Myrbetriq in the future if worsening OAB symptoms/incontinence -Referral placed to Dr. Janese Banks in oncology for metastatic prostate cancer -Continue ADT, will set up follow-up in the next few weeks for Eligard once insurance approved -RTC with me 6 months symptom check    I spent 45  total minutes on the day of the encounter including pre-visit review of the medical record, face-to-face time with the patient, and post visit ordering of labs/imaging/tests.  B[place]ley [place], [facility] [address] [place], [place] [address] [phone]

## 2020-11-27 ENCOUNTER — Other Ambulatory Visit: Payer: Self-pay | Admitting: Family Medicine

## 2020-11-27 DIAGNOSIS — I890 Lymphedema, not elsewhere classified: Secondary | ICD-10-CM

## 2020-11-27 NOTE — Telephone Encounter (Signed)
I placed the order.

## 2020-12-04 ENCOUNTER — Other Ambulatory Visit: Payer: Medicare HMO | Admitting: Nurse Practitioner

## 2020-12-04 ENCOUNTER — Telehealth: Payer: Self-pay | Admitting: Nurse Practitioner

## 2020-12-04 ENCOUNTER — Other Ambulatory Visit: Payer: Self-pay

## 2020-12-04 NOTE — Telephone Encounter (Signed)
I called Ms. Marcello Moores, Mr Ricke's daughter as well as home phone to confirm f/u PC visit, no answer, message left to return call to confirm appointment or can reschedule with contact information

## 2020-12-05 ENCOUNTER — Telehealth: Payer: Self-pay | Admitting: Nurse Practitioner

## 2020-12-05 NOTE — Telephone Encounter (Signed)
I returned Terry [name], Mr. Petraitis's daughters call regarding missed appointment. Terry [name] and I talked about Mr. Hataway current medical condition, functional level, wishes to see Oncology, Urology. We talked about Hospice criteria, eligibility. Terry [name] request I contact Mr. Scheiber to schedule f/u PC visit. I have attempted to contact Mr. Lipschutz on home phone, unable to leave a message, will put on reschedule list after no show appointment yesterday for f/u PC visit

## 2020-12-05 NOTE — Telephone Encounter (Signed)
Eligard injection approved for 12/05/20 - 06/04/21. Auth # [id number]. Pt scheduled.

## 2020-12-11 ENCOUNTER — Telehealth: Payer: Self-pay | Admitting: *Deleted

## 2020-12-11 DIAGNOSIS — N3281 Overactive bladder: Secondary | ICD-10-CM

## 2020-12-11 NOTE — Telephone Encounter (Signed)
OK to send 50mg  mybetriq 1x daily, 30 tabs, 11 refills, thanks

## 2020-12-11 NOTE — Telephone Encounter (Signed)
Pt calling back asking for Myrbetriq, pt states the OAB is worse and would like this sent in. Please advise on dose.

## 2020-12-12 MED ORDER — MIRABEGRON ER 50 MG PO TB24: 50.0000 mg | ORAL_TABLET | Freq: Every day | ORAL | 11 refills | Status: DC

## 2020-12-12 NOTE — Telephone Encounter (Signed)
RX sent

## 2020-12-15 ENCOUNTER — Ambulatory Visit: Payer: Self-pay

## 2020-12-15 ENCOUNTER — Encounter: Payer: Self-pay | Admitting: Urology

## 2020-12-15 ENCOUNTER — Telehealth: Payer: Self-pay | Admitting: Nurse Practitioner

## 2020-12-15 NOTE — Telephone Encounter (Signed)
Called patient to offer to schedule a Palliative f/u visit with NP, no answer and unable to leave a message, no voicemail set up.  I then called patient's daughter, [name], to confirm that I had the correct number for patient and to see if there was another number to try and reach him - no answer and left message with daughter requesting a return call.

## 2020-12-29 ENCOUNTER — Ambulatory Visit (INDEPENDENT_AMBULATORY_CARE_PROVIDER_SITE_OTHER): Payer: Medicare HMO | Admitting: Family Medicine

## 2020-12-29 ENCOUNTER — Other Ambulatory Visit: Payer: Self-pay

## 2020-12-29 DIAGNOSIS — C61 Malignant neoplasm of prostate: Secondary | ICD-10-CM

## 2020-12-29 MED ORDER — LEUPROLIDE ACETATE (6 MONTH) 45 MG ~~LOC~~ KIT
45.0000 mg | PACK | Freq: Once | SUBCUTANEOUS | Status: AC
  Administered 2020-12-29: 45 mg via SUBCUTANEOUS

## 2020-12-29 NOTE — Progress Notes (Signed)
Eligard SubQ Injection   Due to Prostate Cancer patient is present today for a Eligard Injection.  Medication: Eligard 6 month Dose: 45 mg  Location: right  Lot: [id number] Exp: 04/15/2022  Patient tolerated well, no complications were noted  Performed by: Elberta Leatherwood, [place]  Per Dr. Diamantina Providence patient is to continue therapy for 2 years. Patient's next follow up was scheduled for 06/22/2021. This appointment was scheduled using wheel and given to patient today along with reminder continue on Vitamin D 800-1000iu and Calium 1000-1200mg  daily while on Androgen Deprivation Therapy.  PA approval dates: Eligard injection approved for 12/05/20 - 06/04/21

## 2021-01-08 ENCOUNTER — Ambulatory Visit (INDEPENDENT_AMBULATORY_CARE_PROVIDER_SITE_OTHER): Payer: Medicare HMO | Admitting: Family Medicine

## 2021-01-08 ENCOUNTER — Other Ambulatory Visit: Payer: Self-pay

## 2021-01-08 ENCOUNTER — Telehealth: Payer: Self-pay | Admitting: Nurse Practitioner

## 2021-01-08 VITALS — BP 168/96 | HR 80 | Temp 97.6°F | Ht 67.0 in | Wt 307.0 lb

## 2021-01-08 DIAGNOSIS — N189 Chronic kidney disease, unspecified: Secondary | ICD-10-CM | POA: Diagnosis not present

## 2021-01-08 DIAGNOSIS — I480 Paroxysmal atrial fibrillation: Secondary | ICD-10-CM | POA: Diagnosis not present

## 2021-01-08 DIAGNOSIS — I5032 Chronic diastolic (congestive) heart failure: Secondary | ICD-10-CM

## 2021-01-08 DIAGNOSIS — C61 Malignant neoplasm of prostate: Secondary | ICD-10-CM | POA: Diagnosis not present

## 2021-01-08 DIAGNOSIS — E118 Type 2 diabetes mellitus with unspecified complications: Secondary | ICD-10-CM

## 2021-01-08 DIAGNOSIS — E039 Hypothyroidism, unspecified: Secondary | ICD-10-CM

## 2021-01-08 DIAGNOSIS — C7951 Secondary malignant neoplasm of bone: Secondary | ICD-10-CM

## 2021-01-08 MED ORDER — APIXABAN 5 MG PO TABS: 5.0000 mg | ORAL_TABLET | Freq: Two times a day (BID) | ORAL | 3 refills | Status: DC

## 2021-01-08 MED ORDER — TRAMADOL HCL 50 MG PO TABS: 50.0000 mg | ORAL_TABLET | Freq: Two times a day (BID) | ORAL | 0 refills | Status: DC

## 2021-01-08 MED ORDER — LEVOTHYROXINE SODIUM 25 MCG PO TABS: 25.0000 ug | ORAL_TABLET | Freq: Every day | ORAL | 3 refills | Status: DC

## 2021-01-08 MED ORDER — TELMISARTAN 40 MG PO TABS: 40.0000 mg | ORAL_TABLET | Freq: Every day | ORAL | 0 refills | Status: DC

## 2021-01-08 NOTE — Telephone Encounter (Signed)
Called patient to offer to schedule a Palliative f/u visit with NP, no answer and after several rings, no voicemail picked up and then the phone stopped ringing.

## 2021-01-08 NOTE — Progress Notes (Signed)
Subjective:    Patient ID: Terry [name], male    DOB: [birth year], 80 y.o.   MRN: [id number]  HPI  09/12/20 Patient has a history of diastolic congestive heart failure.  Reviewing his chart he has seen Dr. Rockey Situ.  However phone records indicate that he is not being compliant with his fluid pills.  He also has a history of paroxysmal atrial fibrillation for which he is on chronic anticoagulation.  The patient has a history of stage III chronic kidney disease.  He also has a history of hypertension.  Reviewing his urology office visit from October 7, the patient has metastatic prostate cancer.  See bone scan results below: IMPRESSION: 1. Subtle focal uptake over the right inferior pubic ramus correlating with sclerotic lesion on CT and likely due to metastatic disease. Recent small sclerotic areas over the lumbar spine and sacrum on recent CT not well visualized on bone scan.  They have determined to start him on androgen deprivation therapy and reassess the patient in 3 months.  At the present time they are monitoring his right ureteral stent as I believe he is too high of an operative wrist to go in and try to remove it.  Patient is here today for follow-up with me.  Recently a home health nurse mentioned that they heard wheezing in his lungs.  He has no history of smoking however he states that he did smoke a fair amount of marijuana when he was younger.  However today on exam, the patient has no audible wheezing.  He does have bibasilar crackles on both sides.  He also has +1 pitting edema along with fibrotic changes in both legs consistent with chronic lymphedema.  He states that he has not been taking his fluid pill except maybe 2 days a week.  He is certainly not taking Lasix 20 mg a day.  Instead he takes 40 mg 1 or 2 days a week as he deems fit.  He does this only because it causes urinary frequency and he hates to have to go to the bathroom so much.  He denies any shortness of breath on  exam today.  He denies any fever or chest pain or purulent sputum or productive cough or pleurisy.  Blood pressure is acceptable.  He does complain of bilateral knee pain.  He denies any pain in his bones other than his knees.  He does not complain of any pain in his right hip or right gluteal area related to the prostate cancer.  His biggest pain is in his knees and he takes ibuprofen every day for the pain in his knees.  He has underlying chronic kidney disease and is also on anticoagulation for atrial fibrillation.  Therefore I was very clear that he cannot take any over-the-counter NSAID.  His daughter was requesting palliative care to help manage pain however at this point I think the patient would simply benefit from a low-dose tramadol 1 or 2 times a day for the pain in his knees.  I do not feel that he requires hospice at this time.  I discussed this with the patient and he agrees.  He is here today walking under his own power using a cane.  He certainly does not appear bedridden or in a terminal phase of his cancer.  At that time, my plan was: Patient denies palliative care consult.  He would like to try tramadol 50 mg twice daily at home for knee pain.  I believe this is  appropriate.  I strongly emphasized the need to stay away from ibuprofen due to his chronic kidney disease as well as his chronic anticoagulation.  Today on exam he is in normal sinus rhythm however he is appropriately anticoagulated and his heart rate is well controlled.  I urged compliance with his Lasix.  I have asked him to try to take 40 mg at least 3 to 4 days a week.  Also implored him to use his compression wraps for his lymphedema.  I explained that this helps to move fluid from the tissue into the blood vessels where the diuretics can be more effective and help him diurese which I believe would help his weight, his leg swelling, his lymphedema, and his breathing.  Check renal function today along with a  CBC.   01/08/21 Patient is currently on leuprolide androgen deprivation therapy injections every 6 months for his metastatic prostate cancer.  This is being managed by urology.  After the patient's last visit, family members requested palliative care consultation.  Therefore I placed the referral.  He has missed his last appointment and has not rescheduled.   Wt Readings from Last 3 Encounters:  01/08/21 (!) 307 lb (139.3 kg)  11/26/20 300 lb (136.1 kg)  10/28/20 294 lb 1.5 oz (133.4 kg)   Patient's weight is up 13 pounds since his appointment in December.  He is not taking torsemide.  He is not taking spironolactone.  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  He complains of bilateral knee pain which is severe on a daily basis.  This limits his ability to walk.  He reports shortness of breath with activity however he denies any shortness of breath at rest.  He denies any angina.  He denies any orthopnea.  His lungs are clear to auscultation bilaterally today however his blood pressure is extremely high.  He is being compliant with his carvedilol and his telmisartan.  He refuses to take the fluid pill due to urinary incontinence.  He states that the fluid pill makes him urinate so much that he has episodes of incontinence this embarrassing.  He cannot make it to the restroom in time. Past Medical History:  Diagnosis Date  . Arthritis   . Cancer ([place])    Phreesia 08/04/2020  . CHF, chronic (HCC)    diastolic  . CKD (chronic kidney disease) stage 3, GFR 30-59 ml/min (HCC)   . Diabetes mellitus without complication ([place])   . Dysrhythmia   . Hypertension   . Hypothyroidism   . Paroxysmal atrial fibrillation (HCC)   . Prostate cancer [facility])    Past Surgical History:  Procedure Laterality Date  . COLON SURGERY N/A    Phreesia 08/04/2020   Current Outpatient Medications on File Prior to Visit  Medication Sig  Dispense Refill  . allopurinol (ZYLOPRIM) 300 MG tablet Take 1 tablet (300 mg total) by mouth daily. 90 tablet 3  . apixaban (ELIQUIS) 5 MG TABS tablet Take 1 tablet (5 mg total) by mouth 2 (two) times daily. 60 tablet 3  . carvedilol (COREG) 25 MG tablet Take 1 tablet (25 mg total) by mouth 2 (two) times daily with a meal. 180 tablet 0  . fish oil-omega-3 fatty acids 1000 MG capsule Take 2 g by mouth daily.    Marland Kitchen levothyroxine (SYNTHROID) 25 MCG tablet Take 25 mcg by mouth daily before breakfast.    . mirabegron ER (MYRBETRIQ)  50 MG TB24 tablet Take 1 tablet (50 mg total) by mouth daily. 30 tablet 11  . Multiple Vitamins-Minerals (MULTIVITAMINS THER. W/MINERALS) TABS Take 1 tablet by mouth daily.    . polyethylene glycol (MIRALAX / GLYCOLAX) 17 g packet Take 17 g by mouth daily as needed for mild constipation. 14 each 0  . senna-docusate (SENOKOT-S) 8.6-50 MG tablet Take 2 tablets by mouth at bedtime as needed for mild constipation. 60 tablet 0  . spironolactone (ALDACTONE) 25 MG tablet Take 1 tablet (25 mg total) by mouth daily. 30 tablet 0  . tamsulosin (FLOMAX) 0.4 MG CAPS capsule Take 0.4 mg by mouth daily.    Marland Kitchen telmisartan (MICARDIS) 40 MG tablet Take 1 tablet (40 mg total) by mouth daily. 90 tablet 0  . torsemide (DEMADEX) 20 MG tablet Take 2 tablets (40 mg total) by mouth daily. 60 tablet 0   No current facility-administered medications on file prior to visit.   Allergies  Allergen Reactions  . Lisinopril     FACIAL SWELLING   Social History   Socioeconomic History  . Marital status: Widowed    Spouse name: Not on file  . Number of children: Not on file  . Years of education: Not on file  . Highest education level: Not on file  Occupational History  . Not on file  Tobacco Use  . Smoking status: Never Smoker  . Smokeless tobacco: Never Used  Substance and Sexual Activity  . Alcohol use: Yes    Comment: rarely  . Drug use: Yes    Types: Marijuana  . Sexual activity: Yes   Other Topics Concern  . Not on file  Social History Narrative  . Not on file   Social Determinants of Health   Financial Resource Strain: Not on file  Food Insecurity: Not on file  Transportation Needs: Not on file  Physical Activity: Not on file  Stress: Not on file  Social Connections: Not on file  Intimate Partner Violence: Not on file    Past Medical History:  Diagnosis Date  . Arthritis   . Cancer ([place])    Phreesia 08/04/2020  . CHF, chronic (HCC)    diastolic  . CKD (chronic kidney disease) stage 3, GFR 30-59 ml/min (HCC)   . Diabetes mellitus without complication ([place])   . Dysrhythmia   . Hypertension   . Hypothyroidism   . Paroxysmal atrial fibrillation (HCC)   . Prostate cancer [facility])       Review of Systems  All other systems reviewed and are negative.      Objective:   Physical Exam Vitals reviewed.  Constitutional:      General: He is not in acute distress.    Appearance: He is obese. He is not ill-appearing, toxic-appearing or diaphoretic.  Cardiovascular:     Rate and Rhythm: Normal rate. Rhythm irregular.     Heart sounds: Normal heart sounds.  Pulmonary:     Effort: Pulmonary effort is normal. No respiratory distress.     Breath sounds: No wheezing or rales.  Abdominal:     General: Abdomen is flat. Bowel sounds are normal.     Palpations: Abdomen is soft.  Musculoskeletal:     Right knee: Decreased range of motion. Tenderness present.     Left knee: Decreased range of motion. Tenderness present.     Right lower leg: Edema present.     Left lower leg: Edema present.  Neurological:     Mental Status: He is alert.  Assessment & Plan:  Paroxysmal atrial fibrillation (HCC)  Chronic diastolic congestive heart failure (HCC)  Prostate cancer metastatic to bone (HCC)  Chronic kidney disease, unspecified CKD stage - Plan: CBC with Differential/Platelet, COMPLETE METABOLIC PANEL WITH GFR  Controlled type 2 diabetes mellitus  with complication, without long-term current use of insulin ([place]) - Plan: Hemoglobin A1c  Hypothyroidism, unspecified type - Plan: TSH  Patient's blood pressure is elevated today and he appears fluid overloaded however he refuses spironolactone and torsemide.  I will check his renal function and if he can tolerate it I will start him on hydrochlorothiazide.  Hopefully this would help his blood pressure and also work as a mild diuretic which would be better than nothing.  He is appropriately anticoagulated on Eliquis.  He is no longer taking NSAIDs for his knee.  I told him that he can take tramadol twice a day for his knee every day and I will give him 60 tablets/month.  Check an A1c to monitor his blood sugar and check a TSH to monitor his subclinical hypothyroidism for which he takes levothyroxine 25 mcg a day.  I explained to the patient that not taking his diuretic could lead to pulmonary edema and uncontrolled leg swelling

## 2021-01-09 ENCOUNTER — Telehealth: Payer: Self-pay | Admitting: Nurse Practitioner

## 2021-01-09 LAB — COMPLETE METABOLIC PANEL WITH GFR
AG Ratio: 0.9 (calc) — ABNORMAL LOW (ref 1.0–2.5)
ALT: 11 U/L (ref 9–46)
AST: 17 U/L (ref 10–35)
Albumin: 3.8 g/dL (ref 3.6–5.1)
Alkaline phosphatase (APISO): 68 U/L (ref 35–144)
BUN/Creatinine Ratio: 16 (calc) (ref 6–22)
BUN: 21 mg/dL (ref 7–25)
CO2: 27 mmol/L (ref 20–32)
Calcium: 9.5 mg/dL (ref 8.6–10.3)
Chloride: 107 mmol/L (ref 98–110)
Creat: 1.32 mg/dL — ABNORMAL HIGH (ref 0.70–1.18)
GFR, Est African American: 59 mL/min/{1.73_m2} — ABNORMAL LOW (ref >=60)
GFR, Est Non African American: 51 mL/min/{1.73_m2} — ABNORMAL LOW (ref >=60)
Globulin: 4.2 g/dL (calc) — ABNORMAL HIGH (ref 1.9–3.7)
Glucose, Bld: 102 mg/dL — ABNORMAL HIGH (ref 65–99)
Potassium: 4.5 mmol/L (ref 3.5–5.3)
Sodium: 141 mmol/L (ref 135–146)
Total Bilirubin: 0.4 mg/dL (ref 0.2–1.2)
Total Protein: 8 g/dL (ref 6.1–8.1)

## 2021-01-09 LAB — HEMOGLOBIN A1C
Hgb A1c MFr Bld: 5.8 % of total Hgb — ABNORMAL HIGH (ref <5.7)
Mean Plasma Glucose: 120 mg/dL
eAG (mmol/L): 6.6 mmol/L

## 2021-01-09 LAB — CBC WITH DIFFERENTIAL/PLATELET
Absolute Monocytes: 690 cells/uL (ref 200–950)
Basophils Absolute: 41 cells/uL (ref 0–200)
Basophils Relative: 0.6 %
Eosinophils Absolute: 110 cells/uL (ref 15–500)
Eosinophils Relative: 1.6 %
HCT: 37.8 % — ABNORMAL LOW (ref 38.5–50.0)
Hemoglobin: 12.2 g/dL — ABNORMAL LOW (ref 13.2–17.1)
Lymphs Abs: 966 cells/uL (ref 850–3900)
MCH: 25.4 pg — ABNORMAL LOW (ref 27.0–33.0)
MCHC: 32.3 g/dL (ref 32.0–36.0)
MCV: 78.8 fL — ABNORMAL LOW (ref 80.0–100.0)
MPV: 11.1 fL (ref 7.5–12.5)
Monocytes Relative: 10 %
Neutro Abs: 5092 cells/uL (ref 1500–7800)
Neutrophils Relative %: 73.8 %
Platelets: 200 10*3/uL (ref 140–400)
RBC: 4.8 10*6/uL (ref 4.20–5.80)
RDW: 18.3 % — ABNORMAL HIGH (ref 11.0–15.0)
Total Lymphocyte: 14 %
WBC: 6.9 10*3/uL (ref 3.8–10.8)

## 2021-01-09 LAB — TSH: TSH: 4.48 mIU/L (ref 0.40–4.50)

## 2021-01-09 NOTE — Telephone Encounter (Signed)
Called daughter to let her know that I have been unable to reach patient or leave a message to schedule a Palliative f/u visit with him.  Left my name and call back number.

## 2021-01-27 ENCOUNTER — Telehealth: Payer: Self-pay | Admitting: Nurse Practitioner

## 2021-01-27 NOTE — Telephone Encounter (Signed)
Called patient to offer to schedule a Palliative f/u visit, no answer and no voicemail set up.  I called and spoke with daughter, [name], to let her know that I just tried again to reach patient to schedule a Palliative f/u visit and that he didn't answer.  Daughter stated that patient is currently in [place], [place] visiting family and will not return to [place] until mid-April.  Daughter said they would contact us to schedule a visit when he returns.  Notified Palliative NP.

## 2021-01-28 ENCOUNTER — Encounter: Payer: Self-pay | Admitting: *Deleted

## 2021-01-28 MED ORDER — HYDROCHLOROTHIAZIDE 25 MG PO TABS: 25.0000 mg | ORAL_TABLET | Freq: Every day | ORAL | 3 refills | Status: DC

## 2021-03-17 ENCOUNTER — Telehealth: Payer: Self-pay | Admitting: Nurse Practitioner

## 2021-03-17 NOTE — Telephone Encounter (Signed)
Rec'd message that patient's daughter, [name], had called regarding scheduling Palliative f/u visit.  Returned call to daughter, with no answer - left message requesting a return call.

## 2021-03-19 ENCOUNTER — Telehealth: Payer: Self-pay | Admitting: Nurse Practitioner

## 2021-03-19 NOTE — Telephone Encounter (Signed)
Returned call to Reeds, patient's daughter,  and spoke with her about scheduling a f/u visit, I offred a telephone f/u visit and she said that she thinks he is beyond on that, she thinks patient may be ready to transition to Hospice services.  Daughter stated that he is only getting hormone therapy for the prostate cancer and has declined all other treatments and she said he is more debilitated and having more pain.  Told daughter that I would touch base with NP to see where she can work him and that I would get back with her and she was in agreement with this.

## 2021-03-19 NOTE — Telephone Encounter (Signed)
Spoke with daughter, Terry [name], and have scheduled an In-home Palliative f/u visit for 03/24/21 @ 10:30 AM.

## 2021-03-24 ENCOUNTER — Other Ambulatory Visit: Payer: Self-pay

## 2021-03-24 ENCOUNTER — Other Ambulatory Visit: Payer: Medicare HMO | Admitting: Nurse Practitioner

## 2021-03-24 ENCOUNTER — Encounter: Payer: Self-pay | Admitting: Nurse Practitioner

## 2021-03-24 DIAGNOSIS — Z515 Encounter for palliative care: Secondary | ICD-10-CM

## 2021-03-24 DIAGNOSIS — R5381 Other malaise: Secondary | ICD-10-CM

## 2021-03-24 NOTE — Progress Notes (Signed)
[facility] Palliative Care Consult Note Telephone: [phone]  Fax: [phone]    Date of encounter: 03/24/21 PATIENT NAME: Terry [name] [id number]   [phone] (home) [phone] (work) DOB: 1941/03/08 MRN: [id number] PRIMARY CARE PROVIDER:    Susy Frizzle, MD,  [address] [place] [address] [phone]  RESPONSIBLE PARTY:    Contact Information    Name Relation Home Work Collins Daughter [phone]       I met face to face with patient and family in home. Palliative Care was asked to follow this patient by consultation request of  Susy Frizzle, MD to address advance care planning and complex medical decision making. This is a follow up visit.  ASSESSMENT AND PLAN / RECOMMENDATIONS:   Advance Care Planning/Goals of Care: Goals include to maximize quality of life and symptom management. Our advance care planning conversation included a discussion about:     The value and importance of advance care planning   Experiences with loved ones who have been seriously ill or have died   Exploration of personal, cultural or spiritual beliefs that might influence medical decisions   Exploration of goals of care in the event of a sudden injury or illness   Identification and preparation of a healthcare agent   Review and updating or creation of an  advance directive document .  Decision not to resuscitate or to de-escalate disease focused treatments due to poor prognosis.  CODE STATUS: Full code  Symptom Management/Plan: 1. ACP; wishes are to be a full code with aggressive interventions  2. Debility secondary to CHF continue to encourage mobility, fall precautions, ongoing decline  3. Palliative care encounter; Palliative care encounter; Palliative medicine team will continue to support patient, patient's family, and medical team. Visit consisted of counseling and  education dealing with the complex and emotionally intense issues of symptom management and palliative care in the setting of serious and potentially life-threatening illness  Follow up Palliative Care Visit: Palliative care will continue to follow for complex medical decision making, advance care planning, and clarification of goals. Return 8 weeks or prn.  I spent 95  minutes providing this consultation. More than 50% of the time in this consultation was spent in counseling and care coordination.  PPS: 40%  HOSPICE ELIGIBILITY/DIAGNOSIS: TBD  Chief Complaint: Follow-up palliative consult for complex medical decision making  HISTORY OF PRESENT ILLNESS:  Terry [name] is a 80 y.o. year old male  with Malignant neoplasm of transverse colon, Prostate cancer, 9 / 20 / 2021, proximal atrial fibrillation, diastolic congestive heart failure, chronic kidney disease, hypertension, iron deficiency anemia, morbid obesity, depression with anxiety. I called Terry [name] to confirm in person palliative care visit with [name]. I visited Terry [name] who was sitting on the couch in his living room. Terry [name] and I talked about purpose of palliative care visit. We talked about how he was feeling today. We talked about his trip to [place]. We talked about His functional level as he continues to walk with a Walker. Terry [name] endorses he does have falls intermittently. We talked about his requirement for assistance with bathing, dressing. Terry [name] endorses that he does have some incontinent episodes with his urine. Terry [name] endorses he took a bunch of laxatives and had an incontinent bowel movement. We talked about bowel regimen overuse of laxatives. We talked about Terry [name]'s appetite which he shared is good. Terry [name]  endorses he is trying to eat smaller meals. We talked about how he is getting his food. Terry [name] endorses that there is restaurants right near his house he can get food from and he also can get Meals on Wheels  if he wanted. We talked about chronic disease progression of congestive heart failure at length. We talked about the edema his legs including with the lymphoedema machine. We talked about symptoms of pain which he does experience with arthritis. Terry [name] endorses he does get reliefperiod we talked about shortness of breath period Terry [name] endorses that heIt gets more fatigue than desnick.currently Terry [name] is not on oxygen. We talked about his prostate cancer and current treatment regiment that he's on. We talked about medical goals of care including CPR. Terry [name] endorses he wishes to continue if he needs to have CPR he will. Terry [name] endorses thatGod will take care of him and when it's his time it'll be his time. Terry [name] talked about several years ago he became really sick that he felt like he could have passed on but he didn't want to and it became better leaving it all in God's hands. We talked about the option of Hospice benefit through Medicare per daughter Lisa's request for giving Terry [name] information about the program. Terry [name] and I did talk about the Hospice benefit what services are provided. We did talk about Hospice philosophy of comfort care at home, allowing one's disease process to progress naturally. We did talk about his Eligard  which if he chose to go the path of Hospice would need to stop as he would not be eligible if he was receiving the Eligard. We talked about role of palliative care in plan of care. Terry [name] talked about wanting to continue his injection. Terry [name] endorses the last Oncology visit he had a very good appointment and was told that there could be other options for treatment. Terry [name] endorses he wants to take every treatment that is available and offered to him. Terry [name] endorses he is not interested in Hospice. We talked about role of palliative care and plan of care. Terry [name], Terry [name]'s daughter came, joining the discussion. Terry [name] became very agitated and upset  statingthe visit was for telling him not to take his injection and letting him die with Hospice. Explained multiple times with Terry [name] and Terry [name] that the purpose of the palliative care visit was to provide knowledge about Hospice services that was requested by his daughter Terry [name]. We talked about chronic disease progression. We talked about his functional changes. We talked about receiving his injection and his hope for improvement in quality of life. Encourage Terry Rudnick to further discuss his options with his primary provider, Urologist, making Oncology appointment of which he has not done as of yet for further discussion about options for treatment and what would give him the most benefit for quality of life. Terry Shadwick preceded to he continue to be very agitated yelling at his daughter about family dynamics, care issues. attempted to diffuse Terry Leece though it did take some time as he revisited at the time when his wife was sick and passed away. Terry [name] continued to explain the ADL requirements and the need for more help at home. Terry Xia endorses that he does not need anymore help he's doing fine with what he has.We talked about the importance of having resources and knowledge to know what Is available and options. We talked about role of palliative  care in plan of care period we talked about different medical specialties and their concentration on the specific problem they deal with. We talked about palliative being holistic and looking at the whole picture of one's health. Terry [name] talked about at this point in time they would have to pay for private help out of pocket. Terry [name] attempted to explain to Terry Sumlin though he remained agitated, yelling at [facility] and provider. Therapeutic listening, emotional support provided. Asked Terry Gilliam about scheduling follow up palliative care visit. Terry Coran in agreement, follow up visit scheduled. At the end of the palliative care visit Terry Parson expressed he was thankful for the  visit.  History obtained from review of EMR, discussion with  and interview with daughter, Terry [name] and Terry. Elmes.  I reviewed available labs, medications, imaging, studies and related documents from the EMR.  Records reviewed and summarized above.   ROS  Full 14 system review of systems performed and negative with exception of: as per HPI.  Physical Exam: Constitutional: NAD General: obese, chronically ill, irritable male EYES:  lids intact ENMT:  oral mucous membranes moist CV: S1S2, RRR, no LE edema Pulmonary: LCTA, no increased work of breathing, room air Abdomen: soft and non tender GU: deferred MSK: ambulatory with walker Skin: warm and dry, no rashes or wounds on visible skin Neuro:  + generalized weakness,  +mild cognitive impairment Psych: non-anxious affect, A and O x 3  Questions and concerns were addressed. The patient/family was encouraged to call with questions and/or concerns. My business card was provided. Provided general support and encouragement, no other unmet needs identified  Thank you for the opportunity to participate in the care of Terry. Farina.  The palliative care team will continue to follow. Please call our office at [phone] if we can be of additional assistance.   This chart was dictated using voice recognition software. Despite best efforts to proofread, errors can occur which can change the documentation meaning.  Inocente Krach Z Narvel Kozub, NP , MSN, ACHPN  COVID-19 PATIENT SCREENING TOOL Asked and negative response unless otherwise noted:   Have you had symptoms of covid, tested positive or been in contact with someone with symptoms/positive test in the past 5-10 days?  NO

## 2021-04-01 ENCOUNTER — Encounter: Payer: Self-pay | Admitting: Family Medicine

## 2021-05-05 ENCOUNTER — Other Ambulatory Visit: Payer: Self-pay

## 2021-05-05 ENCOUNTER — Encounter: Payer: Self-pay | Admitting: Family Medicine

## 2021-05-05 ENCOUNTER — Other Ambulatory Visit: Payer: Self-pay | Admitting: Family Medicine

## 2021-05-06 ENCOUNTER — Ambulatory Visit: Payer: Medicare HMO | Admitting: Cardiovascular Disease

## 2021-05-06 MED ORDER — TELMISARTAN 40 MG PO TABS: 40.0000 mg | ORAL_TABLET | Freq: Every day | ORAL | 3 refills | Status: DC

## 2021-05-06 NOTE — Progress Notes (Deleted)
Cardiology Office Note    Date:  05/06/2021   ID:  Terry [name], DOB [birth year], MRN [id number]  PCP:  Terry Frizzle, MD  Cardiologist:  Ida Rogue, MD  Electrophysiologist:  None   Chief Complaint: Follow up  History of Present Illness:   Terry [name] is a 80 y.o. male with history of HFpEF, PAF on Eliquis, CKD stage III, DM2, HTN, HLD, colon cancer s/p resection, prostate cancer, morbid obesity, lymphedema, nephrolithiasis with prior ureteral stenting with hematuria who presents for follow-up of his diastolic CHF and PAF.  He was previously followed by Dr. Dec[place] Callas wiht [facility] while living in [place]. Upon moving to [place], he has established with Dr. Rockey Situ.   MPI in 01/2019, showed a medium sized region wh moderate reduction in uptake in the apical and mid inferior segments that was predominately reversible and consistent with ischemia with an EF of 77%. LHC was deferred given hematuria with need for ureteral stent removal. Echo from 07/2019 showed an EF of 65%, no obvious WMA, Gr1DD, normal RVSF with mildly enlarged RV cavity size, mild MR, mild TR, borderline elevated PASP, and a trivial pericardial effusion.   Upon establishing with Dr. Rockey Situ, he underwent echo in 07/2020 that showed an EF of 60-65%, no RWMA, mild LVH, Gr2DD, normal RVSF with mildly enlarged RV cavity size, mildly to moderately dilated left atrium, mildly dilated right atrium, mild to moderate TR, and mild aortic stenosis.   He ws last seen in the office on 10/20/2020 with noted nonadherence to Lasix and lymphedema pumps. Following this, he was admitted on 10/23/2020 with sepsis secondary to UTI complicated by Afib with RVR and acute on CKD. Echo during that admission showed an EF of 55-60%, no RWMA, mild LVH, Gr1DD, normal RVSF with mildly enlarged RV cavity size, moderately dilated left atrium, trivial AI, and mild aortic sclerosis without evidence of stenosis.  ***   Labs independently reviewed: 12/2020 - TSH  normal, A1c 5.8, potassium 4.5, BUN 21, serum creatinine 1.32, albumin 3.8, AST/ALT normal, Hgb 12.2, PLT 200 10/2020 - magnesium 2.1 06/2020 - TC 112, TG 65, HDL 35, LDL 63  Past Medical History:  Diagnosis Date   Arthritis    Cancer ([place])    Phreesia 08/04/2020   CHF, chronic (HCC)    diastolic   CKD (chronic kidney disease) stage 3, GFR 30-59 ml/min (HCC)    Diabetes mellitus without complication (HCC)    Dysrhythmia    Hypertension    Hypothyroidism    Paroxysmal atrial fibrillation ([place])    Prostate cancer [facility])     Past Surgical History:  Procedure Laterality Date   COLON SURGERY N/A    Phreesia 08/04/2020    Current Medications: No outpatient medications have been marked as taking for the 05/07/21 encounter (Appointment) with Rise Mu, PA-C.    Allergies:   Lisinopril   Social History   Socioeconomic History   Marital status: Widowed    Spouse name: Not on file   Number of children: Not on file   Years of education: Not on file   Highest education level: Not on file  Occupational History   Not on file  Tobacco Use   Smoking status: Never   Smokeless tobacco: Never  Substance and Sexual Activity   Alcohol use: Yes    Comment: rarely   Drug use: Yes    Types: Marijuana   Sexual activity: Yes  Other Topics Concern   Not on file  Social History Narrative  Not on file   Social Determinants of Health   Financial Resource Strain: Not on file  Food Insecurity: Not on file  Transportation Needs: Not on file  Physical Activity: Not on file  Stress: Not on file  Social Connections: Not on file     Family History:  The patient's family history includes Hypertension in his mother.  ROS:   ROS   EKGs/Labs/Other Studies Reviewed:    Studies reviewed were summarized above. The additional studies were reviewed today: As above.   EKG:  EKG is ordered today.  The EKG ordered today demonstrates ***  Recent Labs: 10/28/2020: Magnesium  2.1 01/08/2021: ALT 11; BUN 21; Creat 1.32; Hemoglobin 12.2; Platelets 200; Potassium 4.5; Sodium 141; TSH 4.48  Recent Lipid Panel    Component Value Date/Time   CHOL 112 06/25/2020 1144   TRIG 65 06/25/2020 1144   HDL 35 (L) 06/25/2020 1144   CHOLHDL 3.2 06/25/2020 1144   LDLCALC 63 06/25/2020 1144    PHYSICAL EXAM:    VS:  There were no vitals taken for this visit.  BMI: There is no height or weight on file to calculate BMI.  Physical Exam  Wt Readings from Last 3 Encounters:  01/08/21 (!) 307 lb (139.3 kg)  11/26/20 300 lb (136.1 kg)  10/28/20 294 lb 1.5 oz (133.4 kg)     ASSESSMENT & PLAN:   HFpEF:  PAF: ***.  CHADS2VASc at least 5 (CHF, HTN, age x 2, DM).  HTN: Blood pressure ***  HLD: LDL 63.  Morbid obesity with lymphedema:  CKD stage III:  Disposition: F/u with Dr. Rockey Situ or an APP in ***.   Medication Adjustments/Labs and Tests Ordered: Current medicines are reviewed at length with the patient today.  Concerns regarding medicines are outlined above. Medication changes, Labs and Tests ordered today are summarized above and listed in the Patient Instructions accessible in Encounters.   Signed, Christell Faith, PA-C 05/06/2021 7:43 PM     [place] [facility] [address], [place] [address] [phone]

## 2021-05-06 NOTE — Telephone Encounter (Signed)
Pt is seeing Dr. Rockey Situ today at 3:00 pm. Please advise on refill at that time. Pt has not been seen since Dec. 2021. Thank you!

## 2021-05-07 ENCOUNTER — Ambulatory Visit: Payer: Medicare HMO | Admitting: Physician Assistant

## 2021-05-07 MED ORDER — CARVEDILOL 25 MG PO TABS: 25.0000 mg | ORAL_TABLET | Freq: Two times a day (BID) | ORAL | 0 refills | Status: DC

## 2021-05-07 NOTE — Telephone Encounter (Signed)
Please see note pt has appointment today.

## 2021-05-07 NOTE — Telephone Encounter (Signed)
Patient did not show for his appointment. Sent in one prescription with no refills until he can come in to be seen.

## 2021-05-07 NOTE — Telephone Encounter (Signed)
Will check with provider after his appointment with provider.

## 2021-05-08 ENCOUNTER — Encounter: Payer: Self-pay | Admitting: Physician Assistant

## 2021-05-13 ENCOUNTER — Other Ambulatory Visit: Payer: Self-pay

## 2021-05-13 DIAGNOSIS — N3281 Overactive bladder: Secondary | ICD-10-CM

## 2021-05-13 MED ORDER — TELMISARTAN 40 MG PO TABS: 40.0000 mg | ORAL_TABLET | Freq: Every day | ORAL | 3 refills | Status: DC

## 2021-05-13 MED ORDER — TAMSULOSIN HCL 0.4 MG PO CAPS: 0.4000 mg | ORAL_CAPSULE | Freq: Every day | ORAL | 3 refills | Status: DC

## 2021-05-13 MED ORDER — HYDROCHLOROTHIAZIDE 25 MG PO TABS: 25.0000 mg | ORAL_TABLET | Freq: Every day | ORAL | 3 refills | Status: DC

## 2021-05-13 MED ORDER — ALLOPURINOL 300 MG PO TABS: 300.0000 mg | ORAL_TABLET | Freq: Every day | ORAL | 3 refills | Status: DC

## 2021-05-13 MED ORDER — MIRABEGRON ER 50 MG PO TB24: 50.0000 mg | ORAL_TABLET | Freq: Every day | ORAL | 11 refills | Status: DC

## 2021-05-13 MED ORDER — APIXABAN 5 MG PO TABS: 5.0000 mg | ORAL_TABLET | Freq: Two times a day (BID) | ORAL | 3 refills | Status: DC

## 2021-05-13 MED ORDER — LEVOTHYROXINE SODIUM 25 MCG PO TABS: 25.0000 ug | ORAL_TABLET | Freq: Every day | ORAL | 3 refills | Status: DC

## 2021-05-19 ENCOUNTER — Encounter: Payer: Self-pay | Admitting: Family Medicine

## 2021-05-19 ENCOUNTER — Ambulatory Visit: Payer: Medicare HMO | Admitting: Family Medicine

## 2021-05-19 NOTE — Progress Notes (Signed)
Cardiology Office Note  Date:  05/20/2021   ID:  Terry [name], DOB [birth year], MRN [id number]  PCP:  Susy Frizzle, MD   Chief Complaint  Patient presents with   6 month follow up     Patient c/o LE edema, chest  pain and shortness of breath with exertion. Medications reviewed by the patient verbally.     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 Who presents for follow-up of his congestive heart failure  Last seen by myself in clinic  dec 6 , 2021  presenting in a wheelchair Wife died, son visits   Recent events reviewed Oct 28 2020  fever and generalized weakness-found to have complicated UTI, AKI-started on IV antibiotics and admitted to the hospitalist service  Notes indicating weight up at least 12 pounds from prior clinic visit  Lab work reviewed  A1c 5.8 Creatinine 1.32 in February, improved numbers from 1.6 previously Hemoglobin 12  Living more by himself, son comes to visit him Not very mobile Not using his lymphedema compression pumps On last clinic visit we had recommended he use these Leg swelling worse, no weeping sores Sitting most of the day Previously told to hold his calcium channel blocker, does not appear that he is on amlodipine at this time  Lasix held on discharge from the hospital early 2021, only on HCTZ Spironolactone on his discharge medication list, not on his list today Family does not help with his medications  Seen in the hospital for prostate cancer Oct 2021  Lab work reviewed Creatinine 1.32 BUN 21 Hemoglobin 12 Total cholesterol 112 LDL 63 Hemoglobin A1c 5.8  EKG personally reviewed by myself on todays visit Shows normal sinus rhythm rate 67 bpm no significant ST-T wave changes   PMH:   has a past medical history of Arthritis, Cancer ([place]), CHF, chronic ([place]), CKD  (chronic kidney disease) stage 3, GFR 30-59 ml/min ([place]), Diabetes mellitus without complication ([place]), Dysrhythmia, Hypertension, Hypothyroidism, Paroxysmal atrial fibrillation ([place]), and Prostate cancer ([place]).  PSH:    Past Surgical History:  Procedure Laterality Date   COLON SURGERY N/A    Phreesia 08/04/2020    Current Outpatient Medications  Medication Sig Dispense Refill   allopurinol (ZYLOPRIM) 300 MG tablet Take 1 tablet (300 mg total) by mouth daily. 90 tablet 3   apixaban (ELIQUIS) 5 MG TABS tablet Take 1 tablet (5 mg total) by mouth 2 (two) times daily. 60 tablet 3   carvedilol (COREG) 25 MG tablet Take 1 tablet (25 mg total) by mouth 2 (two) times daily with a meal. 180 tablet 0   fish oil-omega-3 fatty acids 1000 MG capsule Take 2 g by mouth daily.     furosemide (LASIX) 20 MG tablet Take 1 tablet (20 mg total) by mouth daily. 90 tablet 3   hydrochlorothiazide (HYDRODIURIL) 25 MG tablet Take 1 tablet (25 mg total) by mouth daily. 90 tablet 3   levothyroxine (SYNTHROID) 25 MCG tablet Take 1 tablet (25 mcg total) by mouth daily before breakfast. 90 tablet 3   mirabegron ER (MYRBETRIQ) 50 MG TB24 tablet Take 1 tablet (50 mg total) by mouth daily. 30 tablet 11   Multiple Vitamins-Minerals (MULTIVITAMINS THER. W/MINERALS) TABS Take 1 tablet by mouth daily.     polyethylene glycol (MIRALAX / GLYCOLAX) 17 g packet Take 17 g by mouth daily as needed for mild constipation. 14 each  0   senna-docusate (SENOKOT-S) 8.6-50 MG tablet Take 2 tablets by mouth at bedtime as needed for mild constipation. 60 tablet 0   spironolactone (ALDACTONE) 25 MG tablet Take 1 tablet (25 mg total) by mouth daily. 90 tablet 3   tamsulosin (FLOMAX) 0.4 MG CAPS capsule Take 1 capsule (0.4 mg total) by mouth daily. 30 capsule 3   telmisartan (MICARDIS) 40 MG tablet Take 1 tablet (40 mg total) by mouth daily. 90 tablet 3   No current facility-administered medications for this visit.     Allergies:   Lisinopril    Social History:  The patient  reports that he has never smoked. He has never used smokeless tobacco. He reports current alcohol use. He reports current drug use. Drug: Marijuana.   Family History:   family history includes Hypertension in his mother.    Review of Systems: Review of Systems  Constitutional: Negative.   HENT: Negative.    Respiratory: Negative.    Cardiovascular:  Positive for leg swelling.  Gastrointestinal: Negative.   Musculoskeletal: Negative.   Neurological: Negative.   Psychiatric/Behavioral: Negative.    All other systems reviewed and are negative.  PHYSICAL EXAM: VS:  BP (!) 150/90 (BP Location: Left Arm, Patient Position: Sitting, Cuff Size: Large)   Pulse 67   Ht 5\' 7"  (1.702 m)   Wt (!) 312 lb (141.5 kg)   SpO2 98%   BMI 48.87 kg/m  , BMI Body mass index is 48.87 kg/m. Constitutional: Morbid obesity, oriented to person, place, and time. No distress.  HENT:  Head: Grossly normal Eyes:  no discharge. No scleral icterus.  Neck: No JVD, no carotid bruits  Cardiovascular: Regular rate and rhythm, no murmurs appreciated Pulmonary/Chest: Clear to auscultation bilaterally, no wheezes or rails Abdominal: Soft.  no distension.  no tenderness.  Musculoskeletal: Normal range of motion Neurological:  normal muscle tone. Coordination normal. No atrophy Skin: Skin warm and dry Psychiatric: normal affect, pleasant   Recent Labs: 10/28/2020: Magnesium 2.1 01/08/2021: ALT 11; BUN 21; Creat 1.32; Hemoglobin 12.2; Platelets 200; Potassium 4.5; Sodium 141; TSH 4.48    Lipid Panel Lab Results  Component Value Date   CHOL 112 06/25/2020   HDL 35 (L) 06/25/2020   LDLCALC 63 06/25/2020   TRIG 65 06/25/2020      Wt Readings from Last 3 Encounters:  05/20/21 (!) 312 lb (141.5 kg)  01/08/21 (!) 307 lb (139.3 kg)  11/26/20 300 lb (136.1 kg)     ASSESSMENT AND PLAN:  Problem List Items Addressed This Visit       Cardiology Problems   Essential  hypertension   Relevant Medications   furosemide (LASIX) 20 MG tablet   spironolactone (ALDACTONE) 25 MG tablet   Other Relevant Orders   EKG 12-Lead   Congestive heart failure (HCC) - Primary   Relevant Medications   furosemide (LASIX) 20 MG tablet   spironolactone (ALDACTONE) 25 MG tablet   Other Relevant Orders   EKG 12-Lead     Other   CKD (chronic kidney disease) stage 3, GFR 30-59 ml/min (HCC)   Other Visit Diagnoses     Morbid obesity ([place])       Lymphedema       Lower extremity edema       Medication management       Relevant Orders   Basic metabolic panel     Lymphedema Strongly recommended that he use lymphedema compression pumps 1 hour twice daily,  This was discussed on his  last clinic visit and again discussed today Continue leg elevation, Ace wraps as needed Family present for discussion Previously on diuretic, this was held Will restart Lasix 20 mg daily, spironolactone, continue HCTZ Suggest he have BMP next week with Dr. Dennard Schaumann and we will arrange for repeat BMP in 2 months time  Chronic diastolic CHF Likely exacerbated by morbid obesity Lasix 20 daily, her recent 12 pound weight gain, legs massively distended exacerbated by lymphedema  Morbid obesity Difficult situation given sedentary lifestyle, morbid obesity, immobility High risk of hospitalization   severe right hydronephrosis with ureteral stent  Has recovered  Paroxysmal atrial fibrillation NSR today continue Eliquis, also high risk of DVT given sedentary state Unclear medication compliance  History of cellulitis At risk given his lymphedema Recommend lymphedema pumps as above  Prostate cancer Stable pelvic adenopathy   Total encounter time more than 25 minutes  Greater than 50% was spent in counseling and coordination of care with the patient    Signed, Esmond Plants, M.D., Ph.D. [place], [place]

## 2021-05-20 ENCOUNTER — Ambulatory Visit (INDEPENDENT_AMBULATORY_CARE_PROVIDER_SITE_OTHER): Payer: Medicare HMO | Admitting: Cardiovascular Disease

## 2021-05-20 ENCOUNTER — Other Ambulatory Visit: Payer: Self-pay

## 2021-05-20 ENCOUNTER — Encounter: Payer: Self-pay | Admitting: Cardiovascular Disease

## 2021-05-20 VITALS — BP 150/90 | HR 67 | Ht 67.0 in | Wt 312.0 lb

## 2021-05-20 DIAGNOSIS — I10 Essential (primary) hypertension: Secondary | ICD-10-CM

## 2021-05-20 DIAGNOSIS — I890 Lymphedema, not elsewhere classified: Secondary | ICD-10-CM | POA: Diagnosis not present

## 2021-05-20 DIAGNOSIS — N1832 Chronic kidney disease, stage 3b: Secondary | ICD-10-CM

## 2021-05-20 DIAGNOSIS — I5032 Chronic diastolic (congestive) heart failure: Secondary | ICD-10-CM | POA: Diagnosis not present

## 2021-05-20 DIAGNOSIS — R600 Localized edema: Secondary | ICD-10-CM

## 2021-05-20 DIAGNOSIS — Z79899 Other long term (current) drug therapy: Secondary | ICD-10-CM

## 2021-05-20 MED ORDER — SPIRONOLACTONE 25 MG PO TABS: 25.0000 mg | ORAL_TABLET | Freq: Every day | ORAL | 3 refills | Status: DC

## 2021-05-20 MED ORDER — FUROSEMIDE 20 MG PO TABS: 20.0000 mg | ORAL_TABLET | Freq: Every day | ORAL | 3 refills | Status: DC

## 2021-05-20 NOTE — Patient Instructions (Addendum)
Medication Instructions:  START lasix 20 mg daily  spironolactone 25 daily   Please take these two together   If you need a refill on your cardiac medications before your next appointment, please call your pharmacy.    Lab work: BMP in 2 months Walk into medical mall at the check in desk, they will direct you to lab registration, hours for labs are Monday-Friday 07:00am-5:30pm (no appointment necessary)   Testing/Procedures: No new testing needed  Follow-Up: At [facility], you and your health needs are our priority.  As part of our continuing mission to provide you with exceptional heart care, we have created designated Provider Care Teams.  These Care Teams include your primary Cardiologist (physician) and Advanced Practice Providers (APPs -  Physician Assistants and Nurse Practitioners) who all work together to provide you with the care you need, when you need it.  You will need a follow up appointment in 6 months  Providers on your designated Care Team:   Murray Hodgkins, NP Christell Faith, PA-C Marrianne Mood, PA-C [place]ence [place], [place]   COVID-19 Vaccine Information can be found at: ShippingScam.co.uk For questions related to vaccine distribution or appointments, please email vaccine@[facility] .com or call [phone].   Please wear your compression pumps to help with the lymphedema in you lower legs

## 2021-05-25 ENCOUNTER — Encounter: Payer: Self-pay | Admitting: Family Medicine

## 2021-05-28 ENCOUNTER — Other Ambulatory Visit: Payer: Self-pay

## 2021-05-28 ENCOUNTER — Telehealth: Payer: Self-pay | Admitting: Family Medicine

## 2021-05-28 ENCOUNTER — Encounter: Payer: Self-pay | Admitting: Family Medicine

## 2021-05-28 ENCOUNTER — Ambulatory Visit (INDEPENDENT_AMBULATORY_CARE_PROVIDER_SITE_OTHER): Payer: Medicare HMO | Admitting: Family Medicine

## 2021-05-28 VITALS — BP 148/92 | HR 88 | Temp 98.6°F | Resp 14 | Ht 67.0 in | Wt 314.0 lb

## 2021-05-28 DIAGNOSIS — C61 Malignant neoplasm of prostate: Secondary | ICD-10-CM

## 2021-05-28 DIAGNOSIS — I480 Paroxysmal atrial fibrillation: Secondary | ICD-10-CM | POA: Diagnosis not present

## 2021-05-28 DIAGNOSIS — N189 Chronic kidney disease, unspecified: Secondary | ICD-10-CM | POA: Diagnosis not present

## 2021-05-28 DIAGNOSIS — I5032 Chronic diastolic (congestive) heart failure: Secondary | ICD-10-CM

## 2021-05-28 DIAGNOSIS — C7951 Secondary malignant neoplasm of bone: Secondary | ICD-10-CM

## 2021-05-28 DIAGNOSIS — I890 Lymphedema, not elsewhere classified: Secondary | ICD-10-CM

## 2021-05-28 MED ORDER — TRAMADOL HCL 50 MG PO TABS: 50.0000 mg | ORAL_TABLET | Freq: Two times a day (BID) | ORAL | 0 refills | Status: AC

## 2021-05-28 NOTE — Telephone Encounter (Signed)
Patient was just seen by provider; daughter [name] who accompanied patient forgot to ask for referral to podiatrist. Please advise at [phone].

## 2021-05-28 NOTE — Progress Notes (Signed)
Subjective:    Patient ID: Terry [name], male    DOB: 22-Dec-1940, 80 y.o.   MRN: [id number]  HPI  09/12/20 Patient has a history of diastolic congestive heart failure.  Reviewing his chart he has seen Dr. Rockey Situ.  However phone records indicate that he is not being compliant with his fluid pills.  He also has a history of paroxysmal atrial fibrillation for which he is on chronic anticoagulation.  The patient has a history of stage III chronic kidney disease.  He also has a history of hypertension.  Reviewing his urology office visit from October 7, the patient has metastatic prostate cancer.  See bone scan results below: IMPRESSION: 1. Subtle focal uptake over the right inferior pubic ramus correlating with sclerotic lesion on CT and likely due to metastatic disease. Recent small sclerotic areas over the lumbar spine and sacrum on recent CT not well visualized on bone scan.  They have determined to start him on androgen deprivation therapy and reassess the patient in 3 months.  At the present time they are monitoring his right ureteral stent as I believe he is too high of an operative risk to go in and try to remove it.  Patient is here today for follow-up with me.  Recently a home health nurse mentioned that they heard wheezing in his lungs.  He has no history of smoking however he states that he did smoke a fair amount of marijuana when he was younger.  However today on exam, the patient has no audible wheezing.  He does have bibasilar crackles on both sides.  He also has +1 pitting edema along with fibrotic changes in both legs consistent with chronic lymphedema.  He states that he has not been taking his fluid pill except maybe 2 days a week.  He is certainly not taking Lasix 20 mg a day.  Instead he takes 40 mg 1 or 2 days a week as he deems fit.  He does this only because it causes urinary frequency and he hates to have to go to the bathroom so much.  He denies any shortness of breath on exam  today.  He denies any fever or chest pain or purulent sputum or productive cough or pleurisy.  Blood pressure is acceptable.  He does complain of bilateral knee pain.  He denies any pain in his bones other than his knees.  He does not complain of any pain in his right hip or right gluteal area related to the prostate cancer.  His biggest pain is in his knees and he takes ibuprofen every day for the pain in his knees.  He has underlying chronic kidney disease and is also on anticoagulation for atrial fibrillation.  Therefore I was very clear that he cannot take any over-the-counter NSAID.  His daughter was requesting palliative care to help manage pain however at this point I think the patient would simply benefit from a low-dose tramadol 1 or 2 times a day for the pain in his knees.  I do not feel that he requires hospice at this time.  I discussed this with the patient and he agrees.  He is here today walking under his own power using a cane.  He certainly does not appear bedridden or in a terminal phase of his cancer.  At that time, my plan was: Patient denies palliative care consult.  He would like to try tramadol 50 mg twice daily at home for knee pain.  I believe this is  appropriate.  I strongly emphasized the need to stay away from ibuprofen due to his chronic kidney disease as well as his chronic anticoagulation.  Today on exam he is in normal sinus rhythm however he is appropriately anticoagulated and his heart rate is well controlled.  I urged compliance with his Lasix.  I have asked him to try to take 40 mg at least 3 to 4 days a week.  Also implored him to use his compression wraps for his lymphedema.  I explained that this helps to move fluid from the tissue into the blood vessels where the diuretics can be more effective and help him diurese which I believe would help his weight, his leg swelling, his lymphedema, and his breathing.  Check renal function today along with a CBC.   01/08/21 Patient is  currently on leuprolide androgen deprivation therapy injections every 6 months for his metastatic prostate cancer.  This is being managed by urology.  After the patient's last visit, family members requested palliative care consultation.  Therefore I placed the referral.  He has missed his last appointment and has not rescheduled.   Patient's weight is up 13 pounds since his appointment in December.  He is not taking torsemide.  He is not taking spironolactone.  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  He complains of bilateral knee pain which is severe on a daily basis.  This limits his ability to walk.  He reports shortness of breath with activity however he denies any shortness of breath at rest.  He denies any angina.  He denies any orthopnea.  His lungs are clear to auscultation bilaterally today however his blood pressure is extremely high.  He is being compliant with his carvedilol and his telmisartan.  He refuses to take the fluid pill due to urinary incontinence.  He states that the fluid pill makes him urinate so much that he has episodes of incontinence this embarrassing.  He cannot make it to the restroom in time.  05/28/21 I have copied his recent cardiologist A/P below for my reference: "Strongly recommended that he use lymphedema compression pumps 1 hour twice daily,  This was discussed on his last clinic visit and again discussed today Continue leg elevation, Ace wraps as needed Family present for discussion Previously on diuretic, this was held Will restart Lasix 20 mg daily, spironolactone, continue HCTZ Suggest he have BMP next week with Dr. Dennard Schaumann and we will arrange for repeat BMP in 2 months time"  Wt Readings from Last 3 Encounters:  05/28/21 (!) 314 lb (142.4 kg)  05/20/21 (!) 312 lb (141.5 kg)  01/08/21 (!) 307 lb (139.3 kg)   Since I last saw the patient, he has gained additional weight of 7  pounds.  He has tense lymphedema in both legs distal to the knee.  He has a weeping small ulcer on the anterior surface of his right shin that is roughly 3 mm in diameter.  The skin is hyperpigmented and fibrotic due to chronic scarring and swelling in both legs distal to the knee.  He denies any chest pain.  He denies any shortness of breath.  However he does report dyspnea on exertion.  He is unable to walk by himself.  He is using a cane but due to his severe obesity he has a difficult time walking more than a few feet with a cane.  He believes that he would  be safer using a rolling walker with a seat.  He gets extremely short of breath with minimal activity so he would be able to sit down and take a break whenever he got short of breath using the rolling walker with a seat.  He is not using his lymphedema compression pumps as recommended.  He states that he has not used them.  He states that he is unable to get them on however I question compliance simply due to an difference.  He also states that he is not taking the Lasix or the spironolactone or the hydrochlorothiazide as recommended by his cardiologist.  He denies any side effects from the medication or any reason that keeps him from taking the medication although I suspect that is due to increased urinary frequency and incontinence.  Today he does not appear fluid overloaded however he is morbidly obese.  His lungs are clear to auscultation bilaterally. Past Medical History:  Diagnosis Date   Arthritis    Cancer ([place])    Phreesia 08/04/2020   CHF, chronic (HCC)    diastolic   CKD (chronic kidney disease) stage 3, GFR 30-59 ml/min (HCC)    Diabetes mellitus without complication (HCC)    Dysrhythmia    Hypertension    Hypothyroidism    Paroxysmal atrial fibrillation (HCC)    Prostate cancer [facility])    Past Surgical History:  Procedure Laterality Date   COLON SURGERY N/A    Phreesia 08/04/2020   Current Outpatient Medications on File Prior  to Visit  Medication Sig Dispense Refill   allopurinol (ZYLOPRIM) 300 MG tablet Take 1 tablet (300 mg total) by mouth daily. 90 tablet 3   apixaban (ELIQUIS) 5 MG TABS tablet Take 1 tablet (5 mg total) by mouth 2 (two) times daily. 60 tablet 3   carvedilol (COREG) 25 MG tablet Take 1 tablet (25 mg total) by mouth 2 (two) times daily with a meal. 180 tablet 0   fish oil-omega-3 fatty acids 1000 MG capsule Take 2 g by mouth daily.     furosemide (LASIX) 20 MG tablet Take 1 tablet (20 mg total) by mouth daily. 90 tablet 3   hydrochlorothiazide (HYDRODIURIL) 25 MG tablet Take 1 tablet (25 mg total) by mouth daily. 90 tablet 3   levothyroxine (SYNTHROID) 25 MCG tablet Take 1 tablet (25 mcg total) by mouth daily before breakfast. 90 tablet 3   mirabegron ER (MYRBETRIQ) 50 MG TB24 tablet Take 1 tablet (50 mg total) by mouth daily. 30 tablet 11   Multiple Vitamins-Minerals (MULTIVITAMINS THER. W/MINERALS) TABS Take 1 tablet by mouth daily.     polyethylene glycol (MIRALAX / GLYCOLAX) 17 g packet Take 17 g by mouth daily as needed for mild constipation. 14 each 0   senna-docusate (SENOKOT-S) 8.6-50 MG tablet Take 2 tablets by mouth at bedtime as needed for mild constipation. 60 tablet 0   spironolactone (ALDACTONE) 25 MG tablet Take 1 tablet (25 mg total) by mouth daily. 90 tablet 3   tamsulosin (FLOMAX) 0.4 MG CAPS capsule Take 1 capsule (0.4 mg total) by mouth daily. 30 capsule 3   telmisartan (MICARDIS) 40 MG tablet Take 1 tablet (40 mg total) by mouth daily. 90 tablet 3   No current facility-administered medications on file prior to visit.   Allergies  Allergen Reactions   Lisinopril     FACIAL SWELLING   Social History   Socioeconomic History   Marital status: Widowed    Spouse name: Not on file  Number of children: Not on file   Years of education: Not on file   Highest education level: Not on file  Occupational History   Not on file  Tobacco Use   Smoking status: Never   Smokeless  tobacco: Never  Vaping Use   Vaping Use: Never used  Substance and Sexual Activity   Alcohol use: Yes    Comment: rarely   Drug use: Yes    Types: Marijuana   Sexual activity: Yes  Other Topics Concern   Not on file  Social History Narrative   Not on file   Social Determinants of Health   Financial Resource Strain: Not on file  Food Insecurity: Not on file  Transportation Needs: Not on file  Physical Activity: Not on file  Stress: Not on file  Social Connections: Not on file  Intimate Partner Violence: Not on file    Past Medical History:  Diagnosis Date   Arthritis    Cancer ([place])    Phreesia 08/04/2020   CHF, chronic (HCC)    diastolic   CKD (chronic kidney disease) stage 3, GFR 30-59 ml/min (HCC)    Diabetes mellitus without complication ([place])    Dysrhythmia    Hypertension    Hypothyroidism    Paroxysmal atrial fibrillation ([place])    Prostate cancer ([place])       Review of Systems     Objective:   Physical Exam Vitals reviewed.  Constitutional:      General: He is not in acute distress.    Appearance: He is obese. He is not ill-appearing, toxic-appearing or diaphoretic.  Cardiovascular:     Rate and Rhythm: Normal rate. Rhythm irregular.     Heart sounds: Normal heart sounds.  Pulmonary:     Effort: Pulmonary effort is normal. No respiratory distress.     Breath sounds: No wheezing or rales.  Abdominal:     General: Abdomen is flat. Bowel sounds are normal.     Palpations: Abdomen is soft.  Musculoskeletal:     Right lower leg: Edema present.     Left lower leg: Edema present.  Neurological:     Mental Status: He is alert.     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     Assessment & Plan:  Paroxysmal atrial fibrillation (HCC)  Chronic diastolic congestive heart failure (HCC) - Plan: traMADol (ULTRAM) 50 MG tablet, CBC with  Differential/Platelet, COMPLETE METABOLIC PANEL WITH GFR, Urinalysis, Routine w reflex microscopic  Prostate cancer metastatic to bone (HCC) - Plan: traMADol (ULTRAM) 50 MG tablet, CBC with Differential/Platelet, COMPLETE METABOLIC PANEL WITH GFR, Urinalysis, Routine w reflex microscopic  Chronic kidney disease, unspecified CKD stage - Plan: traMADol (ULTRAM) 50 MG tablet, CBC with Differential/Platelet, COMPLETE METABOLIC PANEL WITH GFR, Urinalysis, Routine w reflex microscopic  Lymphedema I will consult home health for Unna boot dressing changes 2-3 times a week to prevent the venous stasis ulcer from worsening on the right leg.  I continue to encourage compliance with lymphedema compression wraps which the patient continues to not wear at home.  Also emphasized this to his family.  I will also write a prescription for a rolling walker with a seat to assist in ambulation as he is a very high fall risk.  His prostate cancer is currently being managed by his urologist with androgen deprivation therapy.  He is not yet due  for a PSA.  Today he is in normal sinus rhythm and his heart rate is well controlled however his blood pressure remains high but he is not being compliant with his spironolactone/hydrochlorothiazide or furosemide.  I asked him to at least take his furosemide to help some with the swelling and prevent fluid overload from diastolic heart failure.

## 2021-05-29 ENCOUNTER — Encounter: Payer: Self-pay | Admitting: Family Medicine

## 2021-05-29 ENCOUNTER — Other Ambulatory Visit: Payer: Self-pay | Admitting: Family Medicine

## 2021-05-29 ENCOUNTER — Other Ambulatory Visit: Payer: Self-pay | Admitting: *Deleted

## 2021-05-29 DIAGNOSIS — L603 Nail dystrophy: Secondary | ICD-10-CM

## 2021-05-29 LAB — CBC WITH DIFFERENTIAL/PLATELET
Absolute Monocytes: 605 cells/uL (ref 200–950)
Basophils Absolute: 38 cells/uL (ref 0–200)
Basophils Relative: 0.6 %
Eosinophils Absolute: 170 cells/uL (ref 15–500)
Eosinophils Relative: 2.7 %
HCT: 39 % (ref 38.5–50.0)
Hemoglobin: 12 g/dL — ABNORMAL LOW (ref 13.2–17.1)
Lymphs Abs: 945 cells/uL (ref 850–3900)
MCH: 24.5 pg — ABNORMAL LOW (ref 27.0–33.0)
MCHC: 30.8 g/dL — ABNORMAL LOW (ref 32.0–36.0)
MCV: 79.8 fL — ABNORMAL LOW (ref 80.0–100.0)
MPV: 10.6 fL (ref 7.5–12.5)
Monocytes Relative: 9.6 %
Neutro Abs: 4542 cells/uL (ref 1500–7800)
Neutrophils Relative %: 72.1 %
Platelets: 207 10*3/uL (ref 140–400)
RBC: 4.89 10*6/uL (ref 4.20–5.80)
RDW: 17.8 % — ABNORMAL HIGH (ref 11.0–15.0)
Total Lymphocyte: 15 %
WBC: 6.3 10*3/uL (ref 3.8–10.8)

## 2021-05-29 LAB — COMPLETE METABOLIC PANEL WITH GFR
AG Ratio: 0.9 (calc) — ABNORMAL LOW (ref 1.0–2.5)
ALT: 8 U/L — ABNORMAL LOW (ref 9–46)
AST: 14 U/L (ref 10–35)
Albumin: 3.8 g/dL (ref 3.6–5.1)
Alkaline phosphatase (APISO): 67 U/L (ref 35–144)
BUN/Creatinine Ratio: 15 (calc) (ref 6–22)
BUN: 22 mg/dL (ref 7–25)
CO2: 26 mmol/L (ref 20–32)
Calcium: 10 mg/dL (ref 8.6–10.3)
Chloride: 106 mmol/L (ref 98–110)
Creat: 1.43 mg/dL — ABNORMAL HIGH (ref 0.70–1.22)
Globulin: 4.3 g/dL (calc) — ABNORMAL HIGH (ref 1.9–3.7)
Glucose, Bld: 93 mg/dL (ref 65–99)
Potassium: 4.2 mmol/L (ref 3.5–5.3)
Sodium: 139 mmol/L (ref 135–146)
Total Bilirubin: 0.3 mg/dL (ref 0.2–1.2)
Total Protein: 8.1 g/dL (ref 6.1–8.1)
eGFR: 50 mL/min/{1.73_m2} — ABNORMAL LOW (ref >=60)

## 2021-05-29 LAB — URINALYSIS, ROUTINE W REFLEX MICROSCOPIC
Bacteria, UA: NONE SEEN /HPF
Bilirubin Urine: NEGATIVE
Glucose, UA: NEGATIVE
Hyaline Cast: NONE SEEN /LPF
Ketones, ur: NEGATIVE
Nitrite: NEGATIVE
RBC / HPF: >=60 /HPF — AB (ref 0–2)
Specific Gravity, Urine: 1.013 (ref 1.001–1.035)
pH: 6.5 (ref 5.0–8.0)

## 2021-05-29 LAB — MICROSCOPIC MESSAGE

## 2021-05-29 MED ORDER — CEPHALEXIN 500 MG PO CAPS: 500.0000 mg | ORAL_CAPSULE | Freq: Three times a day (TID) | ORAL | 0 refills | Status: AC

## 2021-06-09 ENCOUNTER — Other Ambulatory Visit: Payer: Self-pay

## 2021-06-09 ENCOUNTER — Other Ambulatory Visit: Payer: Medicare HMO | Admitting: Nurse Practitioner

## 2021-06-09 ENCOUNTER — Encounter: Payer: Self-pay | Admitting: Nurse Practitioner

## 2021-06-09 DIAGNOSIS — Z515 Encounter for palliative care: Secondary | ICD-10-CM

## 2021-06-09 DIAGNOSIS — R0602 Shortness of breath: Secondary | ICD-10-CM

## 2021-06-09 DIAGNOSIS — R5381 Other malaise: Secondary | ICD-10-CM

## 2021-06-09 NOTE — Progress Notes (Signed)
[facility] Consult Note Telephone: [phone]  Fax: [phone]    Date of encounter: 06/09/21 PATIENT NAME: Terry [name] [id number]   [phone] (home) [phone] (work) DOB: October 02, 1941 MRN: [id number] PRIMARY CARE PROVIDER:    Susy Frizzle, MD,  [address] [place] [address] [phone]  RESPONSIBLE PARTY:    Contact Information     Name Relation Home Work Mobile   [place] Daughter [phone]        Due to the COVID-19 crisis, this visit was done via telemedicine from my office and it was initiated and consent by this patient and or family.  I connected with  Terry [name] on 80/26/22 by a video enabled telemedicine application and verified that I am speaking with the correct person.   I discussed the limitations of evaluation and management by telemedicine. The patient expressed understanding and agreed to proceed. Palliative Care was asked to follow this patient by consultation request of  Susy Frizzle, MD to address advance care planning and complex medical decision making. This is a follow up visit. ASSESSMENT AND PLAN / RECOMMENDATIONS:   Symptom Management/Plan: 1. ACP; wishes are to be a full code with aggressive interventions   2. Debility secondary to CHF continue to encourage mobility, fall precautions, ongoing decline  3. Shortness of breath secondary to CHF, educated and discussed medication compliance. We talked about monitoring edema, weights. We talked about mobility.   4. Insomnia; discussed sleep patterns, encouraged sleep hygiene. Terry [name] endorses he does continue to nap during the day.    5. Palliative care encounter; Palliative care encounter; Palliative medicine team will continue to support patient, patient's family, and medical team. Visit consisted of counseling and education dealing with the complex and emotionally intense  issues of symptom management and palliative care in the setting of serious and potentially life-threatening illness   Follow up Palliative Care Visit: Palliative care will continue to follow for complex medical decision making, advance care planning, and clarification of goals. Return 8 weeks or prn.  I spent 40 minutes providing this consultation. More than 50% of the time in this consultation was spent in counseling and care coordination. PPS: 50%  Chief Complaint: Follow up palliative consult for complex medical decision making  HISTORY OF PRESENT ILLNESS:  Terry [name] is a 80 y.o. year old male  with multiple medical problems including Malignant neoplasm of transverse colon, Prostate cancer, 9 / 20 / 2021, proximal atrial fibrillation, diastolic congestive heart failure, chronic kidney disease, hypertension, iron deficiency anemia, morbid obesity, depression with anxiety. I called Terry [name], Terry [name] daughter to confirm in person PC f/u visit. Terry [name] requested change to telemedicine. Changed PC f/u per request. Terry [name] and I talked at length by phone concerning recent MD appointment, Terry [name] current clinical state as she is an Therapist, sports. We talked about Terry [name]'s functional overall decline. We talked about symptoms, appetite, compliance to medications. We talked about resources she is attempting to get for Terry [name]. We talked about hospice, though at this time Terry [name] continues to wish to receive Eligard for prostate cancer. We talked about Urology visit. We talked about Terry [name] sleeping during the day. We talked about weakness, fatigue. We talked about overall decline. Terry [name] endorses Terry [name] continues to drive short distances. We talked about concerns with driving and advised further discussions with primary. Terry [name] endorses she and her siblings have been alternating staying with  Terry [name]. Terry [name] talked about the challenges with trying to care for Terry [name] meeting resistance and barriers to  care. Discussed can have PC RN with PC SW start visits through [facility] program. Terry [name] in agreement. We scheduled telemedicine with video. I connected by video with Terry [name]. We talked about how he has been feeling. Terry [name] endorses he is tired. We talked about symptoms of overall fatigue, weakness BLE, edema, pain, shortness of breath. We talked about appetite. We talked about medications and medication compliance. We talked about Cardiology and Urology last appointments. We talk about chronic disease. We talked about functional abilities, debility. We talked about PC SW/PC RN and Terry [name] in agreement. We talked about medical goals of care to continue aggressive interventions with Eligard for prostate cancer. We talked about role pc in poc. We talked about f/u pc visit, scheduled. Therapeutic listening, emotional support provided. Questions answered.   History obtained from review of EMR, discussion with daughter Terry [name] and  Terry [name].  I reviewed available labs, medications, imaging, studies and related documents from the EMR.  Records reviewed and summarized above.   ROS Full 14 system review of systems performed and negative with exception of: as per HPI.   Physical Exam: deferred Questions and concerns were addressed. The patient/family was encouraged to call with questions and/or concerns. My contact information was provided. Provided general support and encouragement, no other unmet needs identified   Thank you for the opportunity to participate in the care of Terry [name].  The palliative care team will continue to follow. Please call our office at [phone] if we can be of additional assistance.   This chart was dictated using voice recognition software.  Despite best efforts to proofread,  errors can occur which can change the documentation meaning.   Nina Mondor Ihor Gully, NP

## 2021-06-22 ENCOUNTER — Ambulatory Visit: Payer: Self-pay

## 2021-06-23 ENCOUNTER — Encounter: Payer: Self-pay | Admitting: Urology

## 2021-07-16 ENCOUNTER — Telehealth: Payer: Self-pay | Admitting: *Deleted

## 2021-07-16 ENCOUNTER — Telehealth: Payer: Self-pay | Admitting: Family Medicine

## 2021-07-16 ENCOUNTER — Other Ambulatory Visit: Payer: Self-pay | Admitting: *Deleted

## 2021-07-16 DIAGNOSIS — I890 Lymphedema, not elsewhere classified: Secondary | ICD-10-CM

## 2021-07-16 DIAGNOSIS — I5032 Chronic diastolic (congestive) heart failure: Secondary | ICD-10-CM

## 2021-07-16 NOTE — Telephone Encounter (Signed)
New referral placed for [name] for wound care to BLE.   Call placed to patient and patient daughter Terry [name] to follow up. [place].

## 2021-07-16 NOTE — Telephone Encounter (Signed)
Received following message from referrals coordinator, Vaughan Basta Unfortunately we will need an updated Home Health order for this patient. Somehow, Kimberly got him mixed up with his son who has the same name and live in the same household. They were already seeing the son, so they assumed the son was the patient they were suppose to be seeing. [facility] is trying to get it approved again for the father, but they will need a new order put in Epic. Thank you

## 2021-07-16 NOTE — Telephone Encounter (Signed)
I spoke to patient's daughter, [name].  [name] will speak to patient and call back to  schedule Medicare Annual Wellness Visit (AWV) in office.   If not able to come in office, please offer to do virtually or by telephone.  Left office number and my jabber [phone].  Due for AWVI  Please schedule at anytime with Nurse Health Advisor.

## 2021-07-17 NOTE — Telephone Encounter (Signed)
Multiple calls placed to patient and patient daughter with no answer and no return call.   Message to be closed.

## 2021-07-24 ENCOUNTER — Telehealth: Payer: Self-pay | Admitting: *Deleted

## 2021-07-24 NOTE — Telephone Encounter (Signed)
Received call from [place], [facility] SN with [facility] [phone] telephone.   Reports that patient is being seen for BLE edema with open ulcers to R LE. Requested VO for [facility] SN 2x weekly x5 weeks, 3x weekly x2 weeks for UNNA boots. VO given.   Also reports that medication reconciliation was not up to date with last discharge list.   Reports that patient has OTC Vit C, Zinc, and Moringa (supplement to decrease inflammation and pain) that is not on current list.   Also states that patient is supposed to be taking Flomax, but patient reports that he is not having any issues with urination or flow of urine. Advised to continue to take Flomax due to prior prostate cancer.

## 2021-07-31 ENCOUNTER — Encounter: Payer: Self-pay | Admitting: Family Medicine

## 2021-07-31 ENCOUNTER — Telehealth: Payer: Self-pay

## 2021-07-31 ENCOUNTER — Other Ambulatory Visit: Payer: Self-pay

## 2021-07-31 ENCOUNTER — Other Ambulatory Visit: Payer: Self-pay | Admitting: Family Medicine

## 2021-07-31 ENCOUNTER — Telehealth: Payer: Self-pay | Admitting: *Deleted

## 2021-07-31 DIAGNOSIS — N3281 Overactive bladder: Secondary | ICD-10-CM

## 2021-07-31 MED ORDER — TELMISARTAN 40 MG PO TABS: 40.0000 mg | ORAL_TABLET | Freq: Every day | ORAL | 3 refills | Status: DC

## 2021-07-31 MED ORDER — CARVEDILOL 25 MG PO TABS: 25.0000 mg | ORAL_TABLET | Freq: Two times a day (BID) | ORAL | 0 refills | Status: DC

## 2021-07-31 NOTE — Telephone Encounter (Signed)
*  STAT* If patient is at the pharmacy, call can be transferred to refill team.   1. Which medications need to be refilled? (please list name of each medication and dose if known) Carvedilol and Micardis  2. Which pharmacy/location (including street and city if local pharmacy) is medication to be sent to? [place]  3. Do they need a 30 day or 90 day supply? 90  Also, patient would like a call to discuss what these medications are for.

## 2021-07-31 NOTE — Telephone Encounter (Signed)
Received call from [place], [place] with [facility] [phone] telephone.   Reports that OT has not been able to schedule evaluation for this week and requested approval to move to next week.   VO given.

## 2021-07-31 NOTE — Telephone Encounter (Signed)
Pt needs to reach out to PCP for refills Dr. Dola Argyle has been refilling this medication Appears last refilled 07/31/2021 by Dr. Fredric Dine

## 2021-07-31 NOTE — Telephone Encounter (Signed)
[facility] called for verbal order  for home PT/OT for patient  for once a week for 4 weeks .  Gave verbal okay .

## 2021-07-31 NOTE — Telephone Encounter (Signed)
Pt called in requesting a refill of telmisartan (MICARDIS) 40 MG, and carvedilol (COREG) 25 MG tablet. Pt is completely out of these meds. Please advise.  Cb#: [phone]

## 2021-07-31 NOTE — Telephone Encounter (Signed)
Prescription sent to pharmacy.

## 2021-08-05 MED ORDER — MIRABEGRON ER 50 MG PO TB24: 50.0000 mg | ORAL_TABLET | Freq: Every day | ORAL | 1 refills | Status: DC

## 2021-08-05 MED ORDER — SPIRONOLACTONE 25 MG PO TABS: 25.0000 mg | ORAL_TABLET | Freq: Every day | ORAL | 1 refills | Status: DC

## 2021-08-05 MED ORDER — CARVEDILOL 25 MG PO TABS: 25.0000 mg | ORAL_TABLET | Freq: Two times a day (BID) | ORAL | 1 refills | Status: DC

## 2021-08-05 MED ORDER — SENNOSIDES-DOCUSATE SODIUM 8.6-50 MG PO TABS: 2.0000 | ORAL_TABLET | Freq: Every evening | ORAL | 1 refills | Status: DC | PRN

## 2021-08-05 MED ORDER — FUROSEMIDE 20 MG PO TABS: 20.0000 mg | ORAL_TABLET | Freq: Every day | ORAL | 1 refills | Status: DC

## 2021-08-05 MED ORDER — LEVOTHYROXINE SODIUM 25 MCG PO TABS: 25.0000 ug | ORAL_TABLET | Freq: Every day | ORAL | 1 refills | Status: DC

## 2021-08-05 MED ORDER — HYDROCHLOROTHIAZIDE 25 MG PO TABS: 25.0000 mg | ORAL_TABLET | Freq: Every day | ORAL | 1 refills | Status: DC

## 2021-08-05 MED ORDER — APIXABAN 5 MG PO TABS: 5.0000 mg | ORAL_TABLET | Freq: Two times a day (BID) | ORAL | 1 refills | Status: DC

## 2021-08-05 MED ORDER — TELMISARTAN 40 MG PO TABS: 40.0000 mg | ORAL_TABLET | Freq: Every day | ORAL | 1 refills | Status: DC

## 2021-08-05 MED ORDER — ALLOPURINOL 300 MG PO TABS: 300.0000 mg | ORAL_TABLET | Freq: Every day | ORAL | 1 refills | Status: DC

## 2021-08-11 ENCOUNTER — Telehealth: Payer: Self-pay | Admitting: *Deleted

## 2021-08-11 NOTE — Telephone Encounter (Signed)
Received fax from [facility].   Reports that patient stated he had chest pain x3 mornings a week that is not resolved for 40min - 1hour.   PCP made aware and advised OV will be required.   Call placed to patient. [place] [facility].

## 2021-08-20 ENCOUNTER — Other Ambulatory Visit: Payer: Self-pay

## 2021-08-20 ENCOUNTER — Encounter: Payer: Self-pay | Admitting: Urology

## 2021-08-20 ENCOUNTER — Ambulatory Visit (INDEPENDENT_AMBULATORY_CARE_PROVIDER_SITE_OTHER): Payer: Medicare HMO | Admitting: Urology

## 2021-08-20 ENCOUNTER — Telehealth: Payer: Self-pay | Admitting: Family Medicine

## 2021-08-20 VITALS — BP 181/81 | HR 56 | Ht 66.0 in | Wt 300.0 lb

## 2021-08-20 DIAGNOSIS — N3281 Overactive bladder: Secondary | ICD-10-CM | POA: Diagnosis not present

## 2021-08-20 DIAGNOSIS — C61 Malignant neoplasm of prostate: Secondary | ICD-10-CM

## 2021-08-20 DIAGNOSIS — N200 Calculus of kidney: Secondary | ICD-10-CM

## 2021-08-20 NOTE — Telephone Encounter (Signed)
Patient needs to be seen as he was also voicing some c/o chest pain.   Call placed to patient and family. No answer. No VM.   Call placed to [facility]. LM on VM to have patient schedule appointment.

## 2021-08-20 NOTE — Progress Notes (Signed)
08/20/2021 4:27 PM   Jamelle Zeleznik [birth year] [id number]  Reason for visit:  Follow up metastatic prostate cancer, right ureteral stent/stone   HPI: He is a very comorbid 80 year old male who recently moved to the area from [place].  I originally met him on 07/30/2020.  He is a very poor historian, and the majority of the history has been obtained from the outside records that we were able to obtain from [facility].  He has an extensive cardiac history and CHF, and is currently on Eliquis and diuretics.   He was hospitalized in November 2018 after being found down likely secondary to sepsis of urinary source with a 1 cm right mid ureteral stone, and underwent urgent ureteral stent placement with urology at that time.  The right kidney was significantly atrophic and chronically obstructed prior to that stent. He also underwent an exploratory laparotomy for a colon cancer at some point during that hospitalization as well.      He was also found to have an elevated PSA at some point prior to 2018 with a different urologist in [place] Dr. Vedia Coffer. He was last seen in May 2018 and was on ADT for metastatic prostate cancer(last dose May 2018, PSA was 1.9).  His prostate biopsy showed Gleason score 5+4 = 9 prostate adenocarcinoma with a 155 g prostate, and metastatic disease with sclerotic lesions in the pubic rami.   PSA on 06/25/2020 was 32.9, and increased to 41.1 on 07/30/2020, both off of ADT.   At our visit last year he had opted to resume ADT and a 1 month loading dose of [place] Koller was given on 08/21/2020, but he did not initially follow-up.  Imaging at that time with CT abdomen pelvis with contrast and bone scan showed sclerotic bone lesions in the lower lumbar spine and pelvis suspicious for bone mets.  He has been resistant to see oncology in the past, as he was hiding his prostate cancer diagnosis from his family.  His family is now aware of his diagnosis.  I previously had referred him to  oncology for a more comprehensive palliative care discussion, but he did not follow-up with them as recommended.  I still think a more palliative approach is necessary, but I do think it is worth at least visiting with oncology to discuss other alternative options in addition to ADT.  PSA was drawn today.     He has had the right indwelling ureteral stent for the last 3+ years with proximal ureteral stone and completely atrophic right kidney.  I again had a very long conversation with him about options including observation, attempted stent exchange, or attempted ureteroscopy/stone removal/stent change.  I think there is very little benefit at this time with his completely atrophic right kidney, and the fact that he really has not had any culture documented UTIs.  He is high risk for any surgical procedure, but if he was developing recurrent culture documented UTIs we could consider an attempt at right ureteroscopy or stent change in the future.  He understands his comorbidities and that he is high risk for this procedure, and is in agreement with ongoing observation at this time.  He is a very challenging patient to care for, as I am not sure he has a good grasp of his significant comorbidities as well as metastatic prostate cancer despite my attempts to discuss this at length in frank terms.  He repeatedly refers to his religious beliefs anytime we try to talk about prostate cancer  or his mortality.  He also frequently no-shows visits, and has not been present with family which makes care of this very sick and chronically ill patient with metastatic prostate cancer very challenging.  I think he will sincerely benefit from a visit with oncology, and frankly I think a more palliative approach is very reasonable with his age and comorbidities.  PSA today is pending.  We will replace a referral to oncology, and we discussed this with his daughter over the phone.  Regarding his urinary symptoms of urgency,  frequency, nocturia, I recommended continuing the Myrbetriq, and considering a fitting for a condom catheter for overnight.  Again, I think he would be extremely high risk for any surgery for his right-sided stone and ureteral stent change with very little benefit.    I spent 45 total minutes on the day of the encounter including pre-visit review of the medical record, face-to-face time with the patient, and post visit ordering of labs/imaging/tests.  Billey Co, [facility] [address] [place], [place] [address] [phone]

## 2021-08-20 NOTE — Telephone Encounter (Signed)
Received voicemail message from [place] with [facility] to report patient's resting b/p as 144/100; unable to do PT as a result. Stated patient asymptomatic. Requesting call back. Please advise at [phone].

## 2021-08-21 LAB — PSA: Prostate Specific Ag, Serum: 32.1 ng/mL — ABNORMAL HIGH (ref 0.0–4.0)

## 2021-08-25 ENCOUNTER — Telehealth: Payer: Self-pay

## 2021-08-25 NOTE — Telephone Encounter (Signed)
-----   Message from Billey Co, MD sent at 08/25/2021  8:04 AM EDT ----- PSA up to 32, would recommend discussing further with oncology at upcoming appointment  Nickolas Madrid, MD 08/25/2021

## 2021-08-27 ENCOUNTER — Ambulatory Visit (INDEPENDENT_AMBULATORY_CARE_PROVIDER_SITE_OTHER): Payer: Medicare HMO | Admitting: Family Medicine

## 2021-08-27 ENCOUNTER — Telehealth: Payer: Self-pay

## 2021-08-27 ENCOUNTER — Encounter: Payer: Self-pay | Admitting: Family Medicine

## 2021-08-27 ENCOUNTER — Other Ambulatory Visit: Payer: Self-pay

## 2021-08-27 VITALS — BP 160/68 | HR 96 | Temp 97.6°F | Resp 14 | Ht 67.0 in | Wt 306.0 lb

## 2021-08-27 DIAGNOSIS — C61 Malignant neoplasm of prostate: Secondary | ICD-10-CM | POA: Diagnosis not present

## 2021-08-27 DIAGNOSIS — I480 Paroxysmal atrial fibrillation: Secondary | ICD-10-CM

## 2021-08-27 DIAGNOSIS — E118 Type 2 diabetes mellitus with unspecified complications: Secondary | ICD-10-CM

## 2021-08-27 DIAGNOSIS — Z23 Encounter for immunization: Secondary | ICD-10-CM

## 2021-08-27 DIAGNOSIS — I5032 Chronic diastolic (congestive) heart failure: Secondary | ICD-10-CM

## 2021-08-27 DIAGNOSIS — E039 Hypothyroidism, unspecified: Secondary | ICD-10-CM

## 2021-08-27 DIAGNOSIS — I890 Lymphedema, not elsewhere classified: Secondary | ICD-10-CM

## 2021-08-27 DIAGNOSIS — C7951 Secondary malignant neoplasm of bone: Secondary | ICD-10-CM

## 2021-08-27 MED ORDER — ALLOPURINOL 300 MG PO TABS: 300.0000 mg | ORAL_TABLET | Freq: Every day | ORAL | 3 refills | Status: DC

## 2021-08-27 NOTE — Telephone Encounter (Signed)
230 pm.  At the request of Christin Gusler, NP, phone call made to daughter [name] to schedule a home visit.  No answer but message has been left requesting a call back.

## 2021-08-27 NOTE — Progress Notes (Signed)
Subjective:    Patient ID: Terry [name], male    DOB: 21-Jan-1941, 80 y.o.   MRN: [id number]  HPI  01/08/21 Patient is currently on leuprolide androgen deprivation therapy injections every 6 months for his metastatic prostate cancer.  This is being managed by urology.  After the patient's last visit, family members requested palliative care consultation.  Therefore I placed the referral.  He has missed his last appointment and has not rescheduled.   Patient's weight is up 13 pounds since his appointment in December.  He is not taking torsemide.  He is not taking spironolactone.  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  He complains of bilateral knee pain which is severe on a daily basis.  This limits his ability to walk.  He reports shortness of breath with activity however he denies any shortness of breath at rest.  He denies any angina.  He denies any orthopnea.  His lungs are clear to auscultation bilaterally today however his blood pressure is extremely high.  He is being compliant with his carvedilol and his telmisartan.  He refuses to take the fluid pill due to urinary incontinence.  He states that the fluid pill makes him urinate so much that he has episodes of incontinence this embarrassing.  He cannot make it to the restroom in time.  05/28/21 I have copied his recent cardiologist A/P below for my reference: "Strongly recommended that he use lymphedema compression pumps 1 hour twice daily,  This was discussed on his last clinic visit and again discussed today Continue leg elevation, Ace wraps as needed Family present for discussion Previously on diuretic, this was held Will restart Lasix 20 mg daily, spironolactone, continue HCTZ Suggest he have BMP next week with Dr. Dennard Schaumann and we will arrange for repeat BMP in 2 months time"   Since I last saw the patient, he has gained additional weight of 7 pounds.  He  has tense lymphedema in both legs distal to the knee.  He has a weeping small ulcer on the anterior surface of his right shin that is roughly 3 mm in diameter.  The skin is hyperpigmented and fibrotic due to chronic scarring and swelling in both legs distal to the knee.  He denies any chest pain.  He denies any shortness of breath.  However he does report dyspnea on exertion.  He is unable to walk by himself.  He is using a cane but due to his severe obesity he has a difficult time walking more than a few feet with a cane.  He believes that he would be safer using a rolling walker with a seat.  He gets extremely short of breath with minimal activity so he would be able to sit down and take a break whenever he got short of breath using the rolling walker with a seat.  He is not using his lymphedema compression pumps as recommended.  He states that he has not used them.  He states that he is unable to get them on however I question compliance simply due to an difference.  He also states that he is not taking the Lasix or the spironolactone or the hydrochlorothiazide as recommended by his cardiologist.  He denies any side effects from the medication or any reason that keeps him from taking the medication although I suspect that is due to increased urinary frequency and incontinence.  Today he does not  appear fluid overloaded however he is morbidly obese.  His lungs are clear to auscultation bilaterally.  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  I continue to encourage compliance with lymphedema compression wraps which the patient continues to not wear at home.  Also emphasized this to his family.  I will also write a prescription for a rolling walker with a seat to assist in ambulation as he is a very high fall risk.  His prostate cancer is currently being managed by his urologist with  androgen deprivation therapy.  He is not yet due for a PSA.  Today he is in normal sinus rhythm and his heart rate is well controlled however his blood pressure remains high but he is not being compliant with his spironolactone/hydrochlorothiazide or furosemide.  I asked him to at least take his furosemide to help some with the swelling and prevent fluid overload from diastolic heart failure.  08/27/21 I copied his most recent urologist's plan below for my reference: He has had the right indwelling ureteral stent for the last 3+ years with proximal ureteral stone and completely atrophic right kidney.  I again had a very long conversation with him about options including observation, attempted stent exchange, or attempted ureteroscopy/stone removal/stent change.  I think there is very little benefit at this time with his completely atrophic right kidney, and the fact that he really has not had any culture documented UTIs.  He is high risk for any surgical procedure, but if he was developing recurrent culture documented UTIs we could consider an attempt at right ureteroscopy or stent change in the future.  He understands his comorbidities and that he is high risk for this procedure, and is in agreement with ongoing observation at this time.   He is a very challenging patient to care for, as I am not sure he has a good grasp of his significant comorbidities as well as metastatic prostate cancer despite my attempts to discuss this at length in frank terms.  He repeatedly refers to his religious beliefs anytime we try to talk about prostate cancer or his mortality.  He also frequently no-shows visits, and has not been present with family which makes care of this very sick and chronically ill patient with metastatic prostate cancer very challenging.   I think he will sincerely benefit from a visit with oncology, and frankly I think a more palliative approach is very reasonable with his age and comorbidities.  PSA  today is pending.  We will replace a referral to oncology, and we discussed this with his daughter over the phone.   Regarding his urinary symptoms of urgency, frequency, nocturia, I recommended continuing the Myrbetriq, and considering a fitting for a condom catheter for overnight.  Again, I think he would be extremely high risk for any surgery for his right-sided stone and ureteral stent change with very little benefit.   I reviewed his blood pressure from the urology office.  His systolic blood pressure was over 180.  Here today his systolic blood pressure is 160.  Patient states that he has been out of some of his medication.  He is unable to tell me which medicines he has been out of other than allopurinol.  He states that he just started back on them 2 or 3 days ago.  I reviewed his medication list with him  in detail.  Patient is very confused about several of the medicines that are on his medicine list.  He thinks that he is taking them but he is not certain.  He also insists that his blood pressure is better controlled at home.  However he is unable to provide me any values that he has been checking.  On a positive note, he has been receiving wound care.  The nurses been coming out to the home and wrapping his legs.  As result the swelling in his legs is the best I have ever seen.  He has the wraps on today and the swelling is down to trace to +1 which is much better than before.  He denies any chest pain or shortness of breath orthopnea.  He denies any syncope or near syncope.  He does report trouble sleeping at night due to pain in his back and in his legs.  Often he has to get up in the middle the night and move to a chair for comfort and pain not because he short of breath Past Medical History:  Diagnosis Date   Arthritis    Cancer ([place])    Phreesia 08/04/2020   CHF, chronic (HCC)    diastolic   CKD (chronic kidney disease) stage 3, GFR 30-59 ml/min (HCC)    Diabetes mellitus without  complication ([place])    Dysrhythmia    Hypertension    Hypothyroidism    Lymphedema    Paroxysmal atrial fibrillation (HCC)    Prostate cancer [facility])    Past Surgical History:  Procedure Laterality Date   COLON SURGERY N/A    Phreesia 08/04/2020   Current Outpatient Medications on File Prior to Visit  Medication Sig Dispense Refill   allopurinol (ZYLOPRIM) 300 MG tablet Take 1 tablet (300 mg total) by mouth daily. 90 tablet 1   apixaban (ELIQUIS) 5 MG TABS tablet Take 1 tablet (5 mg total) by mouth 2 (two) times daily. 180 tablet 1   carvedilol (COREG) 25 MG tablet Take 1 tablet (25 mg total) by mouth 2 (two) times daily with a meal. 180 tablet 1   furosemide (LASIX) 20 MG tablet Take 1 tablet (20 mg total) by mouth daily. 90 tablet 1   hydrochlorothiazide (HYDRODIURIL) 25 MG tablet Take 1 tablet (25 mg total) by mouth daily. 90 tablet 1   levothyroxine (SYNTHROID) 25 MCG tablet Take 1 tablet (25 mcg total) by mouth daily before breakfast. 90 tablet 1   mirabegron ER (MYRBETRIQ) 50 MG TB24 tablet Take 1 tablet (50 mg total) by mouth daily. 90 tablet 1   Multiple Vitamins-Minerals (MULTIVITAMINS THER. W/MINERALS) TABS Take 1 tablet by mouth daily.     polyethylene glycol (MIRALAX / GLYCOLAX) 17 g packet Take 17 g by mouth daily as needed for mild constipation. 14 each 0   senna-docusate (SENOKOT-S) 8.6-50 MG tablet Take 2 tablets by mouth at bedtime as needed for mild constipation. 180 tablet 1   spironolactone (ALDACTONE) 25 MG tablet Take 1 tablet (25 mg total) by mouth daily. 90 tablet 1   tamsulosin (FLOMAX) 0.4 MG CAPS capsule Take 1 capsule (0.4 mg total) by mouth daily. 30 capsule 3   telmisartan (MICARDIS) 40 MG tablet Take 1 tablet (40 mg total) by mouth daily. 90 tablet 1   No current facility-administered medications on file prior to visit.   Allergies  Allergen Reactions   Lisinopril     FACIAL SWELLING   Social History   Socioeconomic History  Marital status: Widowed     Spouse name: Not on file   Number of children: Not on file   Years of education: Not on file   Highest education level: Not on file  Occupational History   Not on file  Tobacco Use   Smoking status: Never   Smokeless tobacco: Never  Vaping Use   Vaping Use: Never used  Substance and Sexual Activity   Alcohol use: Yes    Comment: rarely   Drug use: Yes    Types: Marijuana   Sexual activity: Yes  Other Topics Concern   Not on file  Social History Narrative   Not on file   Social Determinants of Health   Financial Resource Strain: Not on file  Food Insecurity: Not on file  Transportation Needs: Not on file  Physical Activity: Not on file  Stress: Not on file  Social Connections: Not on file  Intimate Partner Violence: Not on file    Past Medical History:  Diagnosis Date   Arthritis    Cancer ([place])    Phreesia 08/04/2020   CHF, chronic (HCC)    diastolic   CKD (chronic kidney disease) stage 3, GFR 30-59 ml/min (HCC)    Diabetes mellitus without complication (HCC)    Dysrhythmia    Hypertension    Hypothyroidism    Lymphedema    Paroxysmal atrial fibrillation ([place])    Prostate cancer ([place])       Review of Systems     Objective:   Physical Exam Vitals reviewed.  Constitutional:      General: He is not in acute distress.    Appearance: He is obese. He is not ill-appearing, toxic-appearing or diaphoretic.  Cardiovascular:     Rate and Rhythm: Normal rate. Rhythm irregular.     Heart sounds: Normal heart sounds.  Pulmonary:     Effort: Pulmonary effort is normal. No respiratory distress.     Breath sounds: No wheezing or rales.  Abdominal:     General: Abdomen is flat. Bowel sounds are normal.     Palpations: Abdomen is soft.  Musculoskeletal:     Right lower leg: Edema present.     Left lower leg: Edema present.  Neurological:     Mental Status: He is alert.       Assessment & Plan:      Chronic diastolic congestive heart failure  (HCC)  Lymphedema  Paroxysmal atrial fibrillation (HCC)  Prostate cancer metastatic to bone (HCC)  Controlled type 2 diabetes mellitus with complication, without long-term current use of insulin ([place]) - Plan: CBC with Differential/Platelet, COMPLETE METABOLIC PANEL WITH GFR, Hemoglobin A1c  Hypothyroidism, unspecified type - Plan: TSH My first concern is his blood pressure.  Given his history of diastolic heart failure, I am concerned that if his blood pressure remains high it could lead to pulmonary edema and hospital admission.  Second I am concerned about medication compliance.  Therefore I asked the patient to go home and call us back with a list of his medicines as to what he is actually taking so that we can verify against his regular medicine list.  I also want him to call us with his blood pressures at home.  If his blood pressures at home are well controlled, I will not uptitrate his medication further.  However if his medicines at home are missing or if his blood pressures at home are high as they are here, we will need to uptitrate his medication to control  his blood pressure better.  Today he appears euvolemic.  The swelling in his legs is much better on compression therapy.  He is in normal sinus rhythm and his heart rate is well controlled and he is appropriately anticoagulated on Eliquis.  I will check an A1c to ensure adequate management of his diabetes and I will also check a TSH to ensure that his levothyroxine is dosed appropriately.  However the patient is a challenge because I question if he is compliant with his medication.  I believe that he is very well intention but I am concerned that he may get confused and not take some of his medicines by accident.  Therefore I want to verify that he is actually on the medication we believe him to be taking

## 2021-08-28 ENCOUNTER — Ambulatory Visit: Payer: Medicare HMO | Admitting: Physician Assistant

## 2021-08-28 ENCOUNTER — Inpatient Hospital Stay: Payer: Medicare HMO | Attending: Oncology | Admitting: Oncology

## 2021-08-28 ENCOUNTER — Encounter: Payer: Self-pay | Admitting: Oncology

## 2021-08-28 ENCOUNTER — Inpatient Hospital Stay: Payer: Medicare HMO

## 2021-08-28 ENCOUNTER — Encounter: Payer: Self-pay | Admitting: Physician Assistant

## 2021-08-28 VITALS — BP 145/71 | HR 65 | Temp 99.2°F | Resp 20 | Wt 307.8 lb

## 2021-08-28 DIAGNOSIS — Z7189 Other specified counseling: Secondary | ICD-10-CM

## 2021-08-28 DIAGNOSIS — C7951 Secondary malignant neoplasm of bone: Secondary | ICD-10-CM | POA: Insufficient documentation

## 2021-08-28 DIAGNOSIS — I4891 Unspecified atrial fibrillation: Secondary | ICD-10-CM | POA: Insufficient documentation

## 2021-08-28 DIAGNOSIS — C184 Malignant neoplasm of transverse colon: Secondary | ICD-10-CM

## 2021-08-28 DIAGNOSIS — N183 Chronic kidney disease, stage 3 unspecified: Secondary | ICD-10-CM | POA: Diagnosis not present

## 2021-08-28 DIAGNOSIS — Z5111 Encounter for antineoplastic chemotherapy: Secondary | ICD-10-CM | POA: Diagnosis not present

## 2021-08-28 DIAGNOSIS — I130 Hypertensive heart and chronic kidney disease with heart failure and stage 1 through stage 4 chronic kidney disease, or unspecified chronic kidney disease: Secondary | ICD-10-CM | POA: Diagnosis not present

## 2021-08-28 DIAGNOSIS — Z85038 Personal history of other malignant neoplasm of large intestine: Secondary | ICD-10-CM | POA: Insufficient documentation

## 2021-08-28 DIAGNOSIS — C61 Malignant neoplasm of prostate: Secondary | ICD-10-CM | POA: Diagnosis not present

## 2021-08-28 DIAGNOSIS — I509 Heart failure, unspecified: Secondary | ICD-10-CM | POA: Diagnosis not present

## 2021-08-28 LAB — COMPLETE METABOLIC PANEL WITH GFR
AG Ratio: 1.1 (calc) (ref 1.0–2.5)
ALT: 8 U/L — ABNORMAL LOW (ref 9–46)
AST: 12 U/L (ref 10–35)
Albumin: 4.1 g/dL (ref 3.6–5.1)
Alkaline phosphatase (APISO): 87 U/L (ref 35–144)
BUN/Creatinine Ratio: 20 (calc) (ref 6–22)
BUN: 32 mg/dL — ABNORMAL HIGH (ref 7–25)
CO2: 21 mmol/L (ref 20–32)
Calcium: 9.7 mg/dL (ref 8.6–10.3)
Chloride: 109 mmol/L (ref 98–110)
Creat: 1.62 mg/dL — ABNORMAL HIGH (ref 0.70–1.22)
Globulin: 3.8 g/dL (calc) — ABNORMAL HIGH (ref 1.9–3.7)
Glucose, Bld: 92 mg/dL (ref 65–99)
Potassium: 4.2 mmol/L (ref 3.5–5.3)
Sodium: 145 mmol/L (ref 135–146)
Total Bilirubin: 0.2 mg/dL (ref 0.2–1.2)
Total Protein: 7.9 g/dL (ref 6.1–8.1)
eGFR: 43 mL/min/{1.73_m2} — ABNORMAL LOW (ref >=60)

## 2021-08-28 LAB — HEMOGLOBIN A1C
Hgb A1c MFr Bld: 6 % of total Hgb — ABNORMAL HIGH (ref <5.7)
Mean Plasma Glucose: 126 mg/dL
eAG (mmol/L): 7 mmol/L

## 2021-08-28 LAB — CBC WITH DIFFERENTIAL/PLATELET
Absolute Monocytes: 750 cells/uL (ref 200–950)
Basophils Absolute: 53 cells/uL (ref 0–200)
Basophils Relative: 0.7 %
Eosinophils Absolute: 150 cells/uL (ref 15–500)
Eosinophils Relative: 2 %
HCT: 40.4 % (ref 38.5–50.0)
Hemoglobin: 12.7 g/dL — ABNORMAL LOW (ref 13.2–17.1)
Lymphs Abs: 1230 cells/uL (ref 850–3900)
MCH: 25.1 pg — ABNORMAL LOW (ref 27.0–33.0)
MCHC: 31.4 g/dL — ABNORMAL LOW (ref 32.0–36.0)
MCV: 79.8 fL — ABNORMAL LOW (ref 80.0–100.0)
MPV: 11.5 fL (ref 7.5–12.5)
Monocytes Relative: 10 %
Neutro Abs: 5318 cells/uL (ref 1500–7800)
Neutrophils Relative %: 70.9 %
Platelets: 208 10*3/uL (ref 140–400)
RBC: 5.06 10*6/uL (ref 4.20–5.80)
RDW: 16.3 % — ABNORMAL HIGH (ref 11.0–15.0)
Total Lymphocyte: 16.4 %
WBC: 7.5 10*3/uL (ref 3.8–10.8)

## 2021-08-28 LAB — TSH: TSH: 4.04 mIU/L (ref 0.40–4.50)

## 2021-08-31 ENCOUNTER — Encounter: Payer: Self-pay | Admitting: Oncology

## 2021-09-01 ENCOUNTER — Telehealth: Payer: Self-pay | Admitting: *Deleted

## 2021-09-01 NOTE — Telephone Encounter (Signed)
Received call from [place], [facility] SN with [place].   Reports that patient noted to have BP reading of 132/100 on 08/31/2021. States that patient reported he did not take his AM meds on 08/31/2021.  Patient noted to be asymptomatic.

## 2021-09-04 ENCOUNTER — Inpatient Hospital Stay: Payer: Medicare HMO

## 2021-09-04 ENCOUNTER — Telehealth: Payer: Self-pay | Admitting: *Deleted

## 2021-09-04 ENCOUNTER — Encounter: Payer: Self-pay | Admitting: Oncology

## 2021-09-04 ENCOUNTER — Other Ambulatory Visit: Payer: Self-pay

## 2021-09-04 DIAGNOSIS — Z7189 Other specified counseling: Secondary | ICD-10-CM | POA: Insufficient documentation

## 2021-09-04 DIAGNOSIS — C61 Malignant neoplasm of prostate: Secondary | ICD-10-CM

## 2021-09-04 DIAGNOSIS — Z5111 Encounter for antineoplastic chemotherapy: Secondary | ICD-10-CM | POA: Diagnosis not present

## 2021-09-04 MED ORDER — LEUPROLIDE ACETATE (6 MONTH) 45 MG ~~LOC~~ KIT
45.0000 mg | PACK | SUBCUTANEOUS | Status: DC
  Administered 2021-09-04: 45 mg via SUBCUTANEOUS
  Filled 2021-09-04: qty 45

## 2021-09-04 NOTE — Telephone Encounter (Signed)
Received call from [place], [facility] OT with [place] [phone]~ telephone.   Requested VO to extend OT 1x weekly x3 weeks. VO given.

## 2021-09-04 NOTE — Progress Notes (Signed)
Hematology/Oncology Consult note [facility] [phone] [phone]  Patient Care Team: Susy Frizzle, MD as PCP - General (Family Medicine) Minna Merritts, MD as Consulting Physician (Cardiology)   Name of the patient: Terry [name]  [id number]  1941-08-19    Reason for referral-prostate cancer   Referring physician-Dr. Diamantina Providence  Date of visit: 09/04/21   History of presenting illness- Patient is a 80 year old male with multiple comorbidities including hypertension hyperlipidemia congestive heart failure CKD stage III paroxysmal atrial fibrillation who has been followed up by urology over the last 1 year.  He has had a history of colon cancer in the past and has undergone surgery for that as well.  He was being treated for metastatic prostate cancer in 2018 in [place] by Dr. Vedia Coffer.  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  While he was off ADT his PSA increased to 41 in September 2021.  He was given loading dose of Firmagon in urology clinic but subsequently did not follow-up.  Seen by them recently on 08/20/2021 and repeat PSA at that time was 32.  Patient also has a history of right ureteral stent placement for proximal ureteral stone.  Options for potential stent exchange or removal was discussed but he was considered high risk and consideration will be given in the future if he has repeat UTIs.  He has been sent to oncology for further management of his prostate cancer.  His last 45-month Lupron/Eligard was given on 12/29/2020  Patient is here with his son today.  Reports ongoing fatigue.  He is able to ambulate around the house with the help of a walker.  No recent hospitalizations.  ECOG PS- 2-3  Pain scale- 0   Review of systems- Review of Systems  Constitutional:  Positive for malaise/fatigue. Negative for chills, fever and weight loss.  HENT:   Negative for congestion, ear discharge and nosebleeds.   Eyes:  Negative for blurred vision.  Respiratory:  Negative for cough, hemoptysis, sputum production, shortness of breath and wheezing.   Cardiovascular:  Negative for chest pain, palpitations, orthopnea and claudication.  Gastrointestinal:  Negative for abdominal pain, blood in stool, constipation, diarrhea, heartburn, melena, nausea and vomiting.  Genitourinary:  Negative for dysuria, flank pain, frequency, hematuria and urgency.  Musculoskeletal:  Negative for back pain, joint pain and myalgias.  Skin:  Negative for rash.  Neurological:  Negative for dizziness, tingling, focal weakness, seizures, weakness and headaches.  Endo/Heme/Allergies:  Does not bruise/bleed easily.  Psychiatric/Behavioral:  Negative for depression and suicidal ideas. The patient does not have insomnia.    Allergies  Allergen Reactions   Lisinopril     FACIAL SWELLING    Patient Active Problem List   Diagnosis Date Noted   Sepsis ([place]) 67/89/3810   Complicated UTI (urinary tract infection) 10/24/2020   Chronic heart failure with preserved ejection fraction (HFpEF) ([place]) 10/24/2020   CHF, chronic (HCC)    Prostate cancer ([place])    Congestive heart failure ([place]) 07/02/2020   Macrocytic anemia 06/30/2020   Elevated PSA 06/30/2020   Cellulitis of lower extremity 06/30/2020   Prediabetes 01/04/2019   Paroxysmal atrial fibrillation ([place]) 01/01/2019   Risk for falls 12/27/2017   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[place]) 10/18/2017   Right ureteral stone 10/01/2017   Iron deficiency anemia due to chronic blood loss 06/22/2016   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  Depression with anxiety 06/19/2015   CKD (chronic kidney disease) stage 3, GFR 30-59 ml/min (HCC) 05/26/2015   Essential hypertension 11/01/2014   Gouty arthritis of toe 11/01/2014   Morbid obesity due to excess calories ([place]) 11/01/2014   Primary  osteoarthritis of both knees 11/01/2014     Past Medical History:  Diagnosis Date   Arthritis    Cancer ([place])    [employer] 08/04/2020   CHF, chronic (HCC)    diastolic   CKD (chronic kidney disease) stage 3, GFR 30-59 ml/min (HCC)    Diabetes mellitus without complication (HCC)    Dysrhythmia    Hypertension    Hypothyroidism    Lymphedema    Paroxysmal atrial fibrillation (HCC)    Prostate cancer [facility])      Past Surgical History:  Procedure Laterality Date   COLON SURGERY N/A    [employer] 08/04/2020    Social History   Socioeconomic History   Marital status: Widowed    Spouse name: Not on file   Number of children: Not on file   Years of education: Not on file   Highest education level: Not on file  Occupational History   Not on file  Tobacco Use   Smoking status: Never   Smokeless tobacco: Never  Vaping Use   Vaping Use: Never used  Substance and Sexual Activity   Alcohol use: Yes    Comment: rarely   Drug use: Yes    Types: Marijuana   Sexual activity: Yes  Other Topics Concern   Not on file  Social History Narrative   Not on file   Social Determinants of Health   Financial Resource Strain: Not on file  Food Insecurity: Not on file  Transportation Needs: Not on file  Physical Activity: Not on file  Stress: Not on file  Social Connections: Not on file  Intimate Partner Violence: Not on file     Family History  Problem Relation Age of Onset   Hypertension Mother    Diabetes Son      Current Outpatient Medications:    apixaban (ELIQUIS) 5 MG TABS tablet, Take 1 tablet (5 mg total) by mouth 2 (two) times daily., Disp: 180 tablet, Rfl: 1   carvedilol (COREG) 25 MG tablet, Take 1 tablet (25 mg total) by mouth 2 (two) times daily with a meal., Disp: 180 tablet, Rfl: 1   furosemide (LASIX) 20 MG tablet, Take 1 tablet (20 mg total) by mouth daily., Disp: 90 tablet, Rfl: 1   hydrochlorothiazide (HYDRODIURIL) 25 MG tablet, Take 1 tablet (25 mg total)  by mouth daily., Disp: 90 tablet, Rfl: 1   levothyroxine (SYNTHROID) 25 MCG tablet, Take 1 tablet (25 mcg total) by mouth daily before breakfast., Disp: 90 tablet, Rfl: 1   mirabegron ER (MYRBETRIQ) 50 MG TB24 tablet, Take 1 tablet (50 mg total) by mouth daily., Disp: 90 tablet, Rfl: 1   Multiple Vitamins-Minerals (MULTIVITAMINS THER. W/MINERALS) TABS, Take 1 tablet by mouth daily., Disp: , Rfl:    polyethylene glycol (MIRALAX / GLYCOLAX) 17 g packet, Take 17 g by mouth daily as needed for mild constipation., Disp: 14 each, Rfl: 0   senna-docusate (SENOKOT-S) 8.6-50 MG tablet, Take 2 tablets by mouth at bedtime as needed for mild constipation., Disp: 180 tablet, Rfl: 1   spironolactone (ALDACTONE) 25 MG tablet, Take 1 tablet (25 mg total) by mouth daily., Disp: 90 tablet, Rfl: 1   tamsulosin (FLOMAX) 0.4 MG CAPS capsule, Take 1 capsule (0.4 mg total) by  mouth daily., Disp: 30 capsule, Rfl: 3   telmisartan (MICARDIS) 40 MG tablet, Take 1 tablet (40 mg total) by mouth daily., Disp: 90 tablet, Rfl: 1   allopurinol (ZYLOPRIM) 300 MG tablet, Take 1 tablet (300 mg total) by mouth daily. (Patient not taking: Reported on 08/28/2021), Disp: 90 tablet, Rfl: 3   Physical exam:  Vitals:   08/28/21 1509  BP: (!) 145/71  Pulse: 65  Resp: 20  Temp: 99.2 F (37.3 C)  SpO2: 92%  Weight: (!) 307 lb 12.8 oz (139.6 kg)   Physical Exam Constitutional:      Appearance: He is obese.     Comments: Sitting in a wheelchair.  Appears fatigued  Cardiovascular:     Rate and Rhythm: Normal rate and regular rhythm.     Heart sounds: Normal heart sounds.  Pulmonary:     Effort: Pulmonary effort is normal.     Breath sounds: Normal breath sounds.  Abdominal:     General: Bowel sounds are normal.     Palpations: Abdomen is soft.  Skin:    General: Skin is warm and dry.  Neurological:     Mental Status: He is alert and oriented to person, place, and time.       CMP Latest Ref Rng & Units 08/27/2021   Glucose 65 - 99 mg/dL 92  BUN 7 - 25 mg/dL 32(H)  Creatinine 0.70 - 1.22 mg/dL 1.62(H)  Sodium 135 - 146 mmol/L 145  Potassium 3.5 - 5.3 mmol/L 4.2  Chloride 98 - 110 mmol/L 109  CO2 20 - 32 mmol/L 21  Calcium 8.6 - 10.3 mg/dL 9.7  Total Protein 6.1 - 8.1 g/dL 7.9  Total Bilirubin 0.2 - 1.2 mg/dL 0.2  Alkaline Phos 38 - 126 U/L -  AST 10 - 35 U/L 12  ALT 9 - 46 U/L 8(L)   CBC Latest Ref Rng & Units 08/27/2021  WBC 3.8 - 10.8 Thousand/uL 7.5  Hemoglobin 13.2 - 17.1 g/dL 12.7(L)  Hematocrit 38.5 - 50.0 % 40.4  Platelets 140 - 400 Thousand/uL 208    Assessment and plan- Patient is a 80 y.o. male referred for metastatic prostate cancer   Most recent PSA in October 2022 was 32 but patient has been off Lupron since February 2022 and is overdue for it at this time.  I will bring him back next week for 55-month dose of Eligard once we get insurance approval.  I would like to get a baseline CT chest abdomen and pelvis without contrast as well as bone scan to assess his disease status.  He does have an element of CKD and and therefore I am avoiding contrast at this time.  I am inclined to continue Lupron every 6 months and assess his PSA trends and see his overall burden of disease before considering any additional treatments for him.  Did explain to the patient that he has metastatic disease given the presence of bone lesions in the past and therefore treatment will be palliative and not curative.  I will see him back after CT scans are done  Thank you for this kind referral and the opportunity to participate in the care of this patient   Visit Diagnosis 1. Prostate cancer metastatic to bone ([place])   2. Goals of care, counseling/discussion     Dr. Randa Evens, MD, MPH [facility] [phone] 09/04/2021 7:27 AM

## 2021-09-10 ENCOUNTER — Ambulatory Visit: Admission: RE | Admit: 2021-09-10 | Payer: Medicare HMO | Source: Ambulatory Visit

## 2021-09-10 ENCOUNTER — Ambulatory Visit: Payer: Medicare HMO

## 2021-09-16 ENCOUNTER — Telehealth: Payer: Self-pay | Admitting: *Deleted

## 2021-09-16 DIAGNOSIS — C61 Malignant neoplasm of prostate: Secondary | ICD-10-CM

## 2021-09-16 DIAGNOSIS — I10 Essential (primary) hypertension: Secondary | ICD-10-CM

## 2021-09-16 DIAGNOSIS — C7951 Secondary malignant neoplasm of bone: Secondary | ICD-10-CM

## 2021-09-16 DIAGNOSIS — I480 Paroxysmal atrial fibrillation: Secondary | ICD-10-CM

## 2021-09-16 DIAGNOSIS — I5032 Chronic diastolic (congestive) heart failure: Secondary | ICD-10-CM

## 2021-09-16 DIAGNOSIS — E039 Hypothyroidism, unspecified: Secondary | ICD-10-CM

## 2021-09-16 DIAGNOSIS — N3281 Overactive bladder: Secondary | ICD-10-CM

## 2021-09-16 NOTE — Telephone Encounter (Signed)
Received call from [place], [facility] OT with [facility] [phone]~ telephone.    Requested VO to extend OT 1x weekly x3 weeks for medication administration due to cognitive decline.   VO given.   Reports that patient is not taking medication as directed and cannot fill pill box on his own.   Referral orders placed for CCM for medication assistance blister packs.

## 2021-09-17 ENCOUNTER — Telehealth: Payer: Self-pay | Admitting: *Deleted

## 2021-09-17 NOTE — Chronic Care Management (AMB) (Signed)
  Chronic Care Management   Outreach Note  09/17/2021 Name: [name] MRN: [id number] DOB: 03/27/1941  Terry [name] is a 80 y.o. year old male who is a primary care patient of Pickard, [name], MD. I reached out to Vincente Liberty by phone today in response to a referral sent by Mr. Diron Haddon primary care provider.  An unsuccessful telephone outreach was attempted today. The patient was referred to the case management team for assistance with care management and care coordination.   Follow Up Plan: A HIPAA compliant phone message was left for the patient providing contact information and requesting a return call. The care management team will reach out to the patient again over the next 7 days.  If patient returns call to provider office, please advise to call [place] at [phone].  [place] Management  Direct Dial: [phone]

## 2021-09-18 ENCOUNTER — Other Ambulatory Visit: Payer: Self-pay

## 2021-09-18 ENCOUNTER — Encounter
Admission: RE | Admit: 2021-09-18 | Discharge: 2021-09-18 | Disposition: A | Discharge status: Home / Self Care | Payer: Medicare HMO | Source: Ambulatory Visit | Attending: Oncology | Admitting: Oncology

## 2021-09-18 ENCOUNTER — Ambulatory Visit
Admission: RE | Admit: 2021-09-18 | Discharge: 2021-09-18 | Disposition: A | Discharge status: Home / Self Care | Payer: Medicare HMO | Source: Ambulatory Visit | Attending: Oncology | Admitting: Oncology

## 2021-09-18 DIAGNOSIS — C61 Malignant neoplasm of prostate: Secondary | ICD-10-CM | POA: Insufficient documentation

## 2021-09-18 DIAGNOSIS — C7951 Secondary malignant neoplasm of bone: Secondary | ICD-10-CM | POA: Insufficient documentation

## 2021-09-18 MED ORDER — TECHNETIUM TC 99M MEDRONATE IV KIT
20.0000 | PACK | Freq: Once | INTRAVENOUS | Status: AC | PRN
  Administered 2021-09-18: 20.59 via INTRAVENOUS

## 2021-09-21 ENCOUNTER — Telehealth: Payer: Self-pay | Admitting: Oncology

## 2021-09-21 NOTE — Progress Notes (Addendum)
Spoilage occurred on 09/04/2021,Medication dispensed: Eligard 45mg  X2, per Cyndia Diver, RN advised pharmacy of waste/spoilage of medication due to Medication hardened . [employer] manufacture was advised of (spoilage/waste/defective) Defective and is processing replacement of medication waste. Finance team advised of replacement of waste.  Case #:[id number] Lot:  [id number] Exp: 01/2023  Lots & Exp day are the same for both packages   Madalyn Rob, CPhT IV Drug Replacement Specialist  [place] Phone: [phone]

## 2021-09-21 NOTE — Telephone Encounter (Signed)
Per Radiology patient was a NO-Show for his CT scan on 10/27. When I called patient to let him know about rescheduled scan---he declined stating that he does not "want any more scans now, they take too long". Patient confirmed he would be at appointment to see Dr. Janese Banks on 11/9.   CT is still scheduled, will wait to cancel until speaking with him in person on 11/9.

## 2021-09-23 ENCOUNTER — Other Ambulatory Visit: Payer: Self-pay

## 2021-09-23 ENCOUNTER — Inpatient Hospital Stay: Payer: Medicare HMO | Attending: Oncology | Admitting: Oncology

## 2021-09-23 VITALS — BP 142/71 | HR 65 | Temp 98.6°F | Resp 16 | Ht 67.0 in | Wt 301.0 lb

## 2021-09-23 DIAGNOSIS — C61 Malignant neoplasm of prostate: Secondary | ICD-10-CM | POA: Diagnosis present

## 2021-09-23 DIAGNOSIS — C7951 Secondary malignant neoplasm of bone: Secondary | ICD-10-CM | POA: Diagnosis not present

## 2021-09-23 DIAGNOSIS — Z79899 Other long term (current) drug therapy: Secondary | ICD-10-CM | POA: Insufficient documentation

## 2021-09-23 DIAGNOSIS — N183 Chronic kidney disease, stage 3 unspecified: Secondary | ICD-10-CM | POA: Diagnosis not present

## 2021-09-23 DIAGNOSIS — R5383 Other fatigue: Secondary | ICD-10-CM | POA: Diagnosis not present

## 2021-09-23 DIAGNOSIS — Z85038 Personal history of other malignant neoplasm of large intestine: Secondary | ICD-10-CM | POA: Diagnosis not present

## 2021-09-23 DIAGNOSIS — I509 Heart failure, unspecified: Secondary | ICD-10-CM | POA: Diagnosis not present

## 2021-09-23 DIAGNOSIS — I130 Hypertensive heart and chronic kidney disease with heart failure and stage 1 through stage 4 chronic kidney disease, or unspecified chronic kidney disease: Secondary | ICD-10-CM | POA: Diagnosis not present

## 2021-09-23 DIAGNOSIS — I480 Paroxysmal atrial fibrillation: Secondary | ICD-10-CM | POA: Insufficient documentation

## 2021-09-25 ENCOUNTER — Telehealth: Payer: Self-pay | Admitting: Family Medicine

## 2021-09-25 NOTE — Chronic Care Management (AMB) (Signed)
  Chronic Care Management   Outreach Note  09/25/2021 Name: Terry [name] MRN: [id number] DOB: 1941-02-25  Referred by: Susy Frizzle, MD Reason for referral : No chief complaint on file.   An unsuccessful telephone outreach was attempted today. The patient was referred to the pharmacist for assistance with care management and care coordination.   Follow Up Plan: pt will have $20 copay  [place]

## 2021-09-25 NOTE — Chronic Care Management (AMB) (Signed)
  Chronic Care Management   Note  09/25/2021 Name: Terry [name] MRN: [id number] DOB: Feb 06, 1941  Terry [name] is a 80 y.o. year old male who is a primary care patient of Pickard, [name], MD. I reached out to Terry [name] by phone today in response to a referral sent by Terry [name] PCP.  Terry [name] was given information about Chronic Care Management services today including:  CCM service includes personalized support from designated clinical staff supervised by his physician, including individualized plan of care and coordination with other care providers 24/7 contact phone numbers for assistance for urgent and routine care needs. Service will only be billed when office clinical staff spend 20 minutes or more in a month to coordinate care. Only one practitioner may furnish and bill the service in a calendar month. The patient may stop CCM services at any time (effective at the end of the month) by phone call to the office staff. The patient is responsible for co-pay (up to 20% after annual deductible is met) if co-pay is required by the individual health plan.   Patient agreed to services and verbal consent obtained.   Follow up plan: Telephone appointment with care management team member scheduled Terry [name] and Terry [name]  [place] Management  Direct Dial: [phone]

## 2021-09-25 NOTE — Progress Notes (Signed)
  Chronic Care Management   Note  09/25/2021 Name: Hikaru Delorenzo MRN: [id number] DOB: [birth year]  [name] is a 80 y.o. year old male who is a primary care patient of Pickard, [name], MD. I reached out to [facility] by phone today in response to a referral sent by Mr. Fabrizzio Diggs's PCP, Dennard Schaumann, [name], MD.   Mr. Ramthun was given information about Chronic Care Management services today including:  CCM service includes personalized support from designated clinical staff supervised by his physician, including individualized plan of care and coordination with other care providers 24/7 contact phone numbers for assistance for urgent and routine care needs. Service will only be billed when office clinical staff spend 20 minutes or more in a month to coordinate care. Only one practitioner may furnish and bill the service in a calendar month. The patient may stop CCM services at any time (effective at the end of the month) by phone call to the office staff.   Patient agreed to services and verbal consent obtained.   Follow up plan: PT AWARE $20 [place]

## 2021-09-27 ENCOUNTER — Encounter: Payer: Self-pay | Admitting: Oncology

## 2021-09-27 NOTE — Progress Notes (Signed)
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  He has had a history of colon cancer in the past and has undergone surgery for that as well.  He was being treated for metastatic prostate cancer in 2018 in [place] by Dr. Vedia Coffer.  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  While he was off ADT his PSA increased to 41 in September 2021.  He was given loading dose of Firmagon in urology clinic but subsequently did not follow-up.  Seen by them recently on 08/20/2021 and repeat PSA at that time was 32.  Patient also has a history of right ureteral stent placement for proximal ureteral stone.  Options for potential stent exchange or removal was discussed but he was considered high risk and consideration will be given in the future if he has repeat UTIs.  He has been sent to oncology for further management of his prostate cancer.    Patient was given Eligard in February 2022 and then again in October 2022 as he missed a dose in between    Interval history- patient reports no new complaints at this time.  He  has some ongoing fatigue.  He gets around the house with the help of a walker.  ECOG PS- 2-3 Pain scale- 3   Review of systems- Review of Systems  Constitutional:  Positive for malaise/fatigue. Negative for chills, fever and weight loss.  HENT:  Negative for congestion, ear discharge and nosebleeds.   Eyes:  Negative for blurred vision.  Respiratory:  Negative for cough, hemoptysis, sputum production, shortness of breath and wheezing.   Cardiovascular:  Negative for chest pain, palpitations, orthopnea and claudication.  Gastrointestinal:  Negative for abdominal pain, blood in stool, constipation, diarrhea, heartburn, melena, nausea and vomiting.  Genitourinary:  Negative for dysuria, flank pain, frequency, hematuria and urgency.  Musculoskeletal:  Negative for back pain, joint pain and myalgias.  Skin:  Negative for rash.  Neurological:  Negative for dizziness, tingling, focal weakness, seizures, weakness and headaches.  Endo/Heme/Allergies:  Does not bruise/bleed easily.  Psychiatric/Behavioral:  Negative for depression and suicidal ideas. The patient does not have insomnia.      Allergies  Allergen Reactions   Lisinopril     FACIAL SWELLING     Past Medical History:  Diagnosis Date   Arthritis    Cancer ([place])    Phreesia 08/04/2020   CHF, chronic (HCC)    diastolic   CKD (chronic kidney disease) stage 3, GFR 30-59 ml/min (HCC)    Diabetes mellitus without complication (HCC)    Dysrhythmia    Hypertension    Hypothyroidism    Lymphedema    Paroxysmal atrial fibrillation (HCC)    Prostate cancer ([place])  Past Surgical History:  Procedure Laterality Date   COLON SURGERY N/A    Phreesia 08/04/2020    Social History   Socioeconomic History   Marital status: Widowed    Spouse name: Not on file   Number of children: Not on file   Years of education: Not on file   Highest education level: Not on file  Occupational History   Not on file  Tobacco Use   Smoking  status: Never   Smokeless tobacco: Never  Vaping Use   Vaping Use: Never used  Substance and Sexual Activity   Alcohol use: Yes    Comment: rarely   Drug use: Yes    Types: Marijuana   Sexual activity: Yes  Other Topics Concern   Not on file  Social History Narrative   Not on file   Social Determinants of Health   Financial Resource Strain: Not on file  Food Insecurity: Not on file  Transportation Needs: Not on file  Physical Activity: Not on file  Stress: Not on file  Social Connections: Not on file  Intimate Partner Violence: Not on file    Family History  Problem Relation Age of Onset   Hypertension Mother    Diabetes Son      Current Outpatient Medications:    apixaban (ELIQUIS) 5 MG TABS tablet, Take 1 tablet (5 mg total) by mouth 2 (two) times daily., Disp: 180 tablet, Rfl: 1   carvedilol (COREG) 25 MG tablet, Take 1 tablet (25 mg total) by mouth 2 (two) times daily with a meal., Disp: 180 tablet, Rfl: 1   furosemide (LASIX) 20 MG tablet, Take 1 tablet (20 mg total) by mouth daily., Disp: 90 tablet, Rfl: 1   hydrochlorothiazide (HYDRODIURIL) 25 MG tablet, Take 1 tablet (25 mg total) by mouth daily., Disp: 90 tablet, Rfl: 1   levothyroxine (SYNTHROID) 25 MCG tablet, Take 1 tablet (25 mcg total) by mouth daily before breakfast., Disp: 90 tablet, Rfl: 1   mirabegron ER (MYRBETRIQ) 50 MG TB24 tablet, Take 1 tablet (50 mg total) by mouth daily., Disp: 90 tablet, Rfl: 1   Multiple Vitamins-Minerals (MULTIVITAMINS THER. W/MINERALS) TABS, Take 1 tablet by mouth daily., Disp: , Rfl:    polyethylene glycol (MIRALAX / GLYCOLAX) 17 g packet, Take 17 g by mouth daily as needed for mild constipation., Disp: 14 each, Rfl: 0   senna-docusate (SENOKOT-S) 8.6-50 MG tablet, Take 2 tablets by mouth at bedtime as needed for mild constipation., Disp: 180 tablet, Rfl: 1   spironolactone (ALDACTONE) 25 MG tablet, Take 1 tablet (25 mg total) by mouth daily., Disp: 90 tablet, Rfl: 1    tamsulosin (FLOMAX) 0.4 MG CAPS capsule, Take 1 capsule (0.4 mg total) by mouth daily., Disp: 30 capsule, Rfl: 3   telmisartan (MICARDIS) 40 MG tablet, Take 1 tablet (40 mg total) by mouth daily., Disp: 90 tablet, Rfl: 1   allopurinol (ZYLOPRIM) 300 MG tablet, Take 1 tablet (300 mg total) by mouth daily. (Patient not taking: No sig reported), Disp: 90 tablet, Rfl: 3  Physical exam:  Vitals:   09/23/21 1506  BP: (!) 142/71  Pulse: 65  Resp: 16  Temp: 98.6 F (37 C)  TempSrc: Tympanic  Weight: (!) 301 lb (136.5 kg)  Height: 5\' 7"  (1.702 m)   Physical Exam Constitutional:      Appearance: He is obese.     Comments: Sitting in a wheelchair.  Appears in no acute distress  Cardiovascular:     Rate and Rhythm:  Normal rate and regular rhythm.     Heart sounds: Normal heart sounds.  Pulmonary:     Effort: Pulmonary effort is normal.     Breath sounds: Normal breath sounds.  Abdominal:     General: Bowel sounds are normal.     Palpations: Abdomen is soft.  Musculoskeletal:     Cervical back: Normal range of motion.     Right lower leg: Edema present.     Left lower leg: Edema present.  Skin:    General: Skin is warm and dry.  Neurological:     Mental Status: He is alert and oriented to person, place, and time.     CMP Latest Ref Rng & Units 08/27/2021  Glucose 65 - 99 mg/dL 92  BUN 7 - 25 mg/dL 32(H)  Creatinine 0.70 - 1.22 mg/dL 1.62(H)  Sodium 135 - 146 mmol/L 145  Potassium 3.5 - 5.3 mmol/L 4.2  Chloride 98 - 110 mmol/L 109  CO2 20 - 32 mmol/L 21  Calcium 8.6 - 10.3 mg/dL 9.7  Total Protein 6.1 - 8.1 g/dL 7.9  Total Bilirubin 0.2 - 1.2 mg/dL 0.2  Alkaline Phos 38 - 126 U/L -  AST 10 - 35 U/L 12  ALT 9 - 46 U/L 8(L)   CBC Latest Ref Rng & Units 08/27/2021  WBC 3.8 - 10.8 Thousand/uL 7.5  Hemoglobin 13.2 - 17.1 g/dL 12.7(L)  Hematocrit 38.5 - 50.0 % 40.4  Platelets 140 - 400 Thousand/uL 208    No images are attached to the encounter.  NM Bone Scan Whole  Body  Result Date: 09/21/2021 CLINICAL DATA:  Prostate cancer staging. EXAM: NUCLEAR MEDICINE WHOLE BODY BONE SCAN TECHNIQUE: Whole body anterior and posterior images were obtained approximately 3 hours after intravenous injection of radiopharmaceutical. RADIOPHARMACEUTICALS:  20.59 mCi Technetium-64m MDP IV COMPARISON:  Abdomen pelvis CT and reconstructions of 10/24/2020, and the bone scan dated 08/21/2020 FINDINGS: There is uptake in the shoulders, left sternoclavicular joint and both knees, and in both feet which was seen previously and most likely degenerative etiology. There is additional uptake along thoracic spine consistent with bridging enthesopathy including mild focal activity to the right at about T8 or 7. There is new focal activity in the anterior right fifth rib which could be due to trauma or a metastasis. No other focal rib activity is seen. There is mild activity corresponding to a presumed sclerotic metastasis in the right inferior pubic ramus, additional mild activity corresponding to presumed sclerotic metastasis in the left side of the L5 vertebral body. No activity is seen in the sacrum corresponding to the small sclerotic foci noted on CT. There is no new further abnormal uptake in the axial and appendicular skeleton compared to prior study. Physiologic renal activity is again shown. IMPRESSION: Mild activity corresponding to sclerotic lesions in the L5 vertebral body and right ischium, presumed metastatic and has not increased in intensity. There are additional sites of degenerative activity in the appendicular skeleton as above and activity most likely due to degenerative changes in thoracic spine again noted. There is new focal activity in the anterior right fifth rib which could be due to trauma or metastasis. No other new activity is seen. Electronically Signed   By: Telford Nab M.D.   On: 09/21/2021 03:31     Assessment and plan- Patient is a 80 y.o. male with metastatic  castrate sensitive prostate cancer with bone metastases here for routine follow-up    Patient did not show up for his  CT scans which was scheduled. He has now rescheduled her scans.  Sclerotic lesions in the L5 vertebral region as well as right ischium bone scan does show and this was compared to his prior scan from a year ago and has not changed significantly.  His last PSA in October was 32 elevated as compared to 26 in January 2022.  Since then he has received a dose of Eligard.  He would be due for his next dose of Eligard in 3 months time and I will see him at that time with CBC with differential CMP and PSA.  My plan is to continue Eligard along without initiation of additional antiandrogen drugs for now and decide based on his PSA response.   Visit Diagnosis 1. Prostate cancer [facility])      Dr. Randa Evens, MD, MPH [facility] at [facility] [phone] 09/27/2021 7:02 AM

## 2021-09-28 ENCOUNTER — Ambulatory Visit (INDEPENDENT_AMBULATORY_CARE_PROVIDER_SITE_OTHER): Payer: Medicare HMO | Admitting: *Deleted

## 2021-09-28 ENCOUNTER — Ambulatory Visit: Payer: Medicare HMO | Attending: Oncology

## 2021-09-28 DIAGNOSIS — I10 Essential (primary) hypertension: Secondary | ICD-10-CM

## 2021-09-28 DIAGNOSIS — I5032 Chronic diastolic (congestive) heart failure: Secondary | ICD-10-CM

## 2021-09-28 NOTE — Chronic Care Management (AMB) (Signed)
Chronic Care Management   CCM RN Visit Note  09/28/2021 Name: Terry [name] MRN: [id number] DOB: 1941/08/23  Subjective: Terry [name] is a 80 y.o. year old male who is a primary care patient of Pickard, Terry Mcgee, MD. The care management team was consulted for assistance with disease management and care coordination needs.    Engaged with patient by telephone for initial visit in response to provider referral for case management and/or care coordination services.   Consent to Services:  The patient was given the following information about Chronic Care Management services today, agreed to services, and gave verbal consent: 1. CCM service includes personalized support from designated clinical staff supervised by the primary care provider, including individualized plan of care and coordination with other care providers 2. 24/7 contact phone numbers for assistance for urgent and routine care needs. 3. Service will only be billed when office clinical staff spend 20 minutes or more in a month to coordinate care. 4. Only one practitioner may furnish and bill the service in a calendar month. 5.The patient may stop CCM services at any time (effective at the end of the month) by phone call to the office staff. 6. The patient will be responsible for cost sharing (co-pay) of up to 20% of the service fee (after annual deductible is met). Patient agreed to services and consent obtained.  Patient agreed to services and verbal consent obtained.   Assessment: Review of patient past medical history, allergies, medications, health status, including review of consultants reports, laboratory and other test data, was performed as part of comprehensive evaluation and provision of chronic care management services.   SDOH (Social Determinants of Health) assessments and interventions performed:  SDOH Interventions    Flowsheet Row Most Recent Value  SDOH Interventions   Food Insecurity Interventions Intervention Not  Indicated  Transportation Interventions Intervention Not Indicated        CCM Care Plan  Allergies  Allergen Reactions   Lisinopril     FACIAL SWELLING    Outpatient Encounter Medications as of 09/28/2021  Medication Sig   apixaban (ELIQUIS) 5 MG TABS tablet Take 1 tablet (5 mg total) by mouth 2 (two) times daily.   carvedilol (COREG) 25 MG tablet Take 1 tablet (25 mg total) by mouth 2 (two) times daily with a meal.   furosemide (LASIX) 20 MG tablet Take 1 tablet (20 mg total) by mouth daily.   hydrochlorothiazide (HYDRODIURIL) 25 MG tablet Take 1 tablet (25 mg total) by mouth daily.   levothyroxine (SYNTHROID) 25 MCG tablet Take 1 tablet (25 mcg total) by mouth daily before breakfast.   mirabegron ER (MYRBETRIQ) 50 MG TB24 tablet Take 1 tablet (50 mg total) by mouth daily.   Multiple Vitamins-Minerals (MULTIVITAMINS THER. W/MINERALS) TABS Take 1 tablet by mouth daily.   polyethylene glycol (MIRALAX / GLYCOLAX) 17 g packet Take 17 g by mouth daily as needed for mild constipation.   senna-docusate (SENOKOT-S) 8.6-50 MG tablet Take 2 tablets by mouth at bedtime as needed for mild constipation.   spironolactone (ALDACTONE) 25 MG tablet Take 1 tablet (25 mg total) by mouth daily.   tamsulosin (FLOMAX) 0.4 MG CAPS capsule Take 1 capsule (0.4 mg total) by mouth daily.   telmisartan (MICARDIS) 40 MG tablet Take 1 tablet (40 mg total) by mouth daily.   allopurinol (ZYLOPRIM) 300 MG tablet Take 1 tablet (300 mg total) by mouth daily. (Patient not taking: No sig reported)   No facility-administered encounter medications on file as of 09/28/2021.  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[place]) 10/18/2017   Right ureteral stone 10/01/2017   Iron deficiency anemia due to chronic blood loss 06/22/2016   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   Depression with anxiety 06/19/2015   CKD (chronic kidney disease) stage 3, GFR 30-59 ml/min (HCC) 05/26/2015   Essential hypertension 11/01/2014   Gouty arthritis of toe 11/01/2014   Morbid obesity due to excess calories ([place]) 11/01/2014   Primary osteoarthritis of both knees 11/01/2014    Conditions to be addressed/monitored:CHF and HTN  Care Plan : RN Care Manager plan of care  Updates made by Kassie Mends, RN since 09/28/2021 12:00 AM     Problem: No plan of care established for management of chronic disease states (CHF, HTN, cancer)   Priority: High     Long-Range Goal: Development of plan of care for chronic disease management (CHF, HTN, cancer)   Start Date: 09/28/2021  Expected End Date: 03/27/2022  Priority: High  Note:    Current Barriers:  Knowledge Deficits related to plan of care for management of CHF and HTN  Chronic Disease Management support and education needs related to CHF and HTN Patient reports his adult son lives with him and provides assistance if needed, has adult daughter Terry [name] [facility]), patient reports he still drives, is overall independent with aspects of his care, requires assistance getting up steps, uses shower seat for bathing.  Patient reports he has prostate cancer and with last scan/ tests, etc reports " things are about the same, stabilized for now", sees oncologist Dr. Randa Evens with last visit 09/23/21.  Patient reports he has a scale but does not weight often, has blood pressure cuff and checks blood pressure "  on occasion"  Patient reports he does not adhere to a special diet but tries " to eat as healthy as I can"   RNCM Clinical Goal(s):  Patient will verbalize understanding of plan for management of CHF and HTN as evidenced by patient report, EHR review take all medications exactly as prescribed and will call provider for medication related questions as evidenced by patient report, EHR review, collaboration with CCM pharmacist    attend all scheduled medical appointments: 12/10/21 with CCM pharmacist as evidenced by patient report, EHR review and        through collaboration with RN Care manager, provider, and care team.   Interventions: 1:1 collaboration with primary care provider regarding development and update of comprehensive plan of care as evidenced by provider attestation and co-signature Inter-disciplinary care team collaboration (see longitudinal plan of care) Evaluation of current treatment plan related to  self management and patient's adherence to plan as established by provider  Hypertension Interventions: Last practice recorded BP readings:  BP Readings from Last 3 Encounters:  09/23/21 (!) 142/71  08/28/21 (!) 145/71  08/27/21 (!) 160/68  Most recent eGFR/CrCl:  Lab Results  Component Value Date   EGFR 43 (  L) 08/27/2021    No components found for: CRCL  Evaluation of current treatment plan related to hypertension self management and patient's adherence to plan as established by provider; Provided education to patient re: stroke prevention, s/s of heart attack and stroke; Reviewed medications with patient and discussed importance of compliance; Discussed plans with patient for ongoing care management follow up and provided patient with direct contact information for care management team; Advised patient, providing education and rationale, to monitor blood pressure daily and record, calling PCP for findings outside established parameters;  Discussed complications of poorly  controlled blood pressure such as heart disease, stroke, circulatory complications, vision complications, kidney impairment, sexual dysfunction;  Provided education via My Chart- low sodium diet  Heart Failure Interventions: Basic overview and discussion of pathophysiology of Heart Failure reviewed; Provided education on low sodium diet; Reviewed Heart Failure Action Plan in depth and provided written copy; Assessed need for readable accurate scales in home; Provided education about placing scale on hard, flat surface; Advised patient to weigh each morning after emptying bladder; Discussed importance of daily weight and advised patient to weigh and record daily; Discussed the importance of keeping all appointments with provider; Screening for signs and symptoms of depression related to chronic disease state;  Assessed social determinant of health barriers;   Reviewed upcoming scheduled appointments  Patient Goals/Self-Care Activities: Patient will self administer medications as prescribed as evidenced by self report/primary caregiver report  Patient will attend all scheduled provider appointments as evidenced by clinician review of documented attendance to scheduled appointments and patient/caregiver report Patient will continue to perform ADL's independently as evidenced by patient/caregiver report Patient will continue to perform IADL's independently as evidenced by patient/caregiver report Patient will call provider office for new concerns or questions as evidenced by review of documented incoming telephone call notes and patient report call office if I gain more than 2 pounds in one day or 5 pounds in one week keep legs up while sitting track weight in diary weigh myself daily develop a rescue plan follow rescue plan if symptoms flare-up - check blood pressure 3 times per week - write blood pressure results in a log or diary - take blood pressure log to all doctor appointments -  develop an action plan for high blood pressure - eat more whole grains, fruits and vegetables, lean meats and healthy fats Call RN care manager if you have any questions at [phone]   Follow Up Plan:  Telephone outreach call scheduled for:  10/30/2021      Plan:Telephone follow up appointment with care management team member scheduled for:  10/30/2021  [name] [facility], BSN RN Case Manager [facility] [phone]

## 2021-09-28 NOTE — Patient Instructions (Addendum)
Visit Information   PATIENT GOALS/PLAN OF CARE:  Care Plan : RN Care Manager plan of care  Updates made by Kassie Mends, RN since 09/28/2021 12:00 AM     Problem: No plan of care established for management of chronic disease states (CHF, HTN, cancer)   Priority: High     Long-Range Goal: Development of plan of care for chronic disease management (CHF, HTN, cancer)   Start Date: 09/28/2021  Expected End Date: 03/27/2022  Priority: High  Note:    Current Barriers:  Knowledge Deficits related to plan of care for management of CHF and HTN  Chronic Disease Management support and education needs related to CHF and HTN Patient reports his adult son lives with him and provides assistance if needed, has adult daughter [name] [facility]), patient reports he still drives, is overall independent with aspects of his care, requires assistance getting up steps, uses shower seat for bathing.  Patient reports he has prostate cancer and with last scan/ tests, etc reports " things are about the same, stabilized for now", sees oncologist Dr. Randa Evens with last visit 09/23/21.  Patient reports he has a scale but does not weight often, has blood pressure cuff and checks blood pressure " on occasion"  Patient reports he does not adhere to a special diet but tries " to eat as healthy as I can"   RNCM Clinical Goal(s):  Patient will verbalize understanding of plan for management of CHF and HTN as evidenced by patient report, EHR review take all medications exactly as prescribed and will call provider for medication related questions as evidenced by patient report, EHR review, collaboration with CCM pharmacist    attend all scheduled medical appointments: 12/10/21 with CCM pharmacist as evidenced by patient report, EHR review and        through collaboration with RN Care manager, provider, and care team.   Interventions: 1:1 collaboration with primary care provider regarding development and update of comprehensive  plan of care as evidenced by provider attestation and co-signature Inter-disciplinary care team collaboration (see longitudinal plan of care) Evaluation of current treatment plan related to  self management and patient's adherence to plan as established by provider  Hypertension Interventions: Last practice recorded BP readings:  BP Readings from Last 3 Encounters:  09/23/21 (!) 142/71  08/28/21 (!) 145/71  08/27/21 (!) 160/68  Most recent eGFR/CrCl:  Lab Results  Component Value Date   EGFR 43 (L) 08/27/2021    No components found for: CRCL  Evaluation of current treatment plan related to hypertension self management and patient's adherence to plan as established by provider; Provided education to patient re: stroke prevention, s/s of heart attack and stroke; Reviewed medications with patient and discussed importance of compliance; Discussed plans with patient for ongoing care management follow up and provided patient with direct contact information for care management team; Advised patient, providing education and rationale, to monitor blood pressure daily and record, calling PCP for findings outside established parameters;  Discussed complications of poorly controlled blood pressure such as heart disease, stroke, circulatory complications, vision complications, kidney impairment, sexual dysfunction;  Provided education via My Chart- low sodium diet  Heart Failure Interventions: Basic overview and discussion of pathophysiology of Heart Failure reviewed; Provided education on low sodium diet; Reviewed Heart Failure Action Plan in depth and provided written copy; Assessed need for readable accurate scales in home; Provided education about placing scale on hard, flat surface; Advised patient to weigh each morning after emptying bladder; Discussed importance  of daily weight and advised patient to weigh and record daily; Discussed the importance of keeping all appointments with  provider; Screening for signs and symptoms of depression related to chronic disease state;  Assessed social determinant of health barriers;   Reviewed upcoming scheduled appointments  Patient Goals/Self-Care Activities: Patient will self administer medications as prescribed as evidenced by self report/primary caregiver report  Patient will attend all scheduled provider appointments as evidenced by clinician review of documented attendance to scheduled appointments and patient/caregiver report Patient will continue to perform ADL's independently as evidenced by patient/caregiver report Patient will continue to perform IADL's independently as evidenced by patient/caregiver report Patient will call provider office for new concerns or questions as evidenced by review of documented incoming telephone call notes and patient report call office if I gain more than 2 pounds in one day or 5 pounds in one week keep legs up while sitting track weight in diary weigh myself daily develop a rescue plan follow rescue plan if symptoms flare-up - check blood pressure 3 times per week - write blood pressure results in a log or diary - take blood pressure log to all doctor appointments - develop an action plan for high blood pressure - eat more whole grains, fruits and vegetables, lean meats and healthy fats Call RN care manager if you have any questions at [phone]   Follow Up Plan:  Telephone outreach call scheduled for:  10/30/2021      Consent to CCM Services: Mr. Alfieri was given information about Chronic Care Management services including:  CCM service includes personalized support from designated clinical staff supervised by his physician, including individualized plan of care and coordination with other care providers 24/7 contact phone numbers for assistance for urgent and routine care needs. Service will only be billed when office clinical staff spend 20 minutes or more in a month to  coordinate care. Only one practitioner may furnish and bill the service in a calendar month. The patient may stop CCM services at any time (effective at the end of the month) by phone call to the office staff. The patient will be responsible for cost sharing (co-pay) of up to 20% of the service fee (after annual deductible is met).  Patient agreed to services and verbal consent obtained.   Patient verbalizes understanding of instructions provided today and agrees to view in [place].   Telephone follow up appointment with care management team member scheduled for:   10/30/2021  Heart Failure Action Plan A heart failure action plan helps you understand what to do when you have symptoms of heart failure. Your action plan is a color-coded plan that lists the symptoms to watch for and indicates what actions to take. If you have symptoms in the red zone, you need medical care right away. If you have symptoms in the yellow zone, you are having problems. If you have symptoms in the green zone, you are doing well. Follow the plan that was created by you and your health care provider. Review your plan each time you visit your health care provider. Red zone These signs and symptoms mean you should get medical help right away: You have trouble breathing when resting. You have a dry cough that is getting worse. You have swelling or pain in your legs or abdomen that is getting worse. You suddenly gain more than 2-3 lb (0.9-1.4 kg) in 24 hours, or more than 5 lb (2.3 kg) in a week. This amount may be more or less depending  on your condition. You have trouble staying awake or you feel confused. You have chest pain. You do not have an appetite. You pass out. You have worsening sadness or depression. If you have any of these symptoms, call your local emergency services (911 in the [place] right away. Do not drive yourself to the hospital. Yellow zone These signs and symptoms mean your condition may be  getting worse and you should make some changes: You have trouble breathing when you are active, or you need to sleep with your head raised on extra pillows to help you breathe. You have swelling in your legs or abdomen. You gain 2-3 lb (0.9-1.4 kg) in 24 hours, or 5 lb (2.3 kg) in a week. This amount may be more or less depending on your condition. You get tired easily. You have trouble sleeping. You have a dry cough. If you have any of these symptoms: Contact your health care provider within the next day. Your health care provider may adjust your medicines. Green zone These signs mean you are doing well and can continue what you are doing: You do not have shortness of breath. You have very little swelling or no new swelling. Your weight is stable (no gain or loss). You have a normal activity level. You do not have chest pain or any other new symptoms. Follow these instructions at home: Take over-the-counter and prescription medicines only as told by your health care provider. Weigh yourself daily. Your target weight is __________ lb (__________ kg). Call your health care provider if you gain more than __________ lb (__________ kg) in 24 hours, or more than __________ lb (__________ kg) in a week. Health care provider name: _____________________________________________________ Health care provider phone number: _____________________________________________________ Eat a heart-healthy diet. Work with a diet and nutrition specialist (dietitian) to create an eating plan that is best for you. Keep all follow-up visits. This is important. Where to find more information American Heart Association: www.heart.org Summary A heart failure action plan helps you understand what to do when you have symptoms of heart failure. Follow the action plan that was created by you and your health care provider. Get help right away if you have any symptoms in the red zone. This information is not intended to  replace advice given to you by your health care provider. Make sure you discuss any questions you have with your health care provider. Document Revised: 06/16/2020 Document Reviewed: 06/16/2020 Elsevier Patient Education  2022 [place]. Low-Sodium Eating Plan Sodium, which is an element that makes up salt, helps you maintain a healthy balance of fluids in your body. Too much sodium can increase your blood pressure and cause fluid and waste to be held in your body. Your health care provider or dietitian may recommend following this plan if you have high blood pressure (hypertension), kidney disease, liver disease, or heart failure. Eating less sodium can help lower your blood pressure, reduce swelling, and protect your heart, liver, and kidneys. What are tips for following this plan? Reading food labels The Nutrition Facts label lists the amount of sodium in one serving of the food. If you eat more than one serving, you must multiply the listed amount of sodium by the number of servings. Choose foods with less than 140 mg of sodium per serving. Avoid foods with 300 mg of sodium or more per serving. Shopping  Look for lower-sodium products, often labeled as "low-sodium" or "no salt added." Always check the sodium content, even if foods are labeled  as "unsalted" or "no salt added." Buy fresh foods. Avoid canned foods and pre-made or frozen meals. Avoid canned, cured, or processed meats. Buy breads that have less than 80 mg of sodium per slice. Cooking  Eat more home-cooked food and less restaurant, buffet, and fast food. Avoid adding salt when cooking. Use salt-free seasonings or herbs instead of table salt or sea salt. Check with your health care provider or pharmacist before using salt substitutes. Cook with plant-based oils, such as canola, sunflower, or olive oil. Meal planning When eating at a restaurant, ask that your food be prepared with less salt or no salt, if possible. Avoid  dishes labeled as brined, pickled, cured, smoked, or made with soy sauce, miso, or teriyaki sauce. Avoid foods that contain MSG (monosodium glutamate). MSG is sometimes added to [place] food, bouillon, and some canned foods. Make meals that can be grilled, baked, poached, roasted, or steamed. These are generally made with less sodium. General information Most people on this plan should limit their sodium intake to 1,500-2,000 mg (milligrams) of sodium each day. What foods should I eat? Fruits Fresh, frozen, or canned fruit. Fruit juice. Vegetables Fresh or frozen vegetables. "No salt added" canned vegetables. "No salt added" tomato sauce and paste. Low-sodium or reduced-sodium tomato and vegetable juice. Grains Low-sodium cereals, including oats, puffed wheat and rice, and shredded wheat. Low-sodium crackers. Unsalted rice. Unsalted pasta. Low-sodium bread. Whole-grain breads and whole-grain pasta. Meats and other proteins Fresh or frozen (no salt added) meat, poultry, seafood, and fish. Low-sodium canned tuna and salmon. Unsalted nuts. Dried peas, beans, and lentils without added salt. Unsalted canned beans. Eggs. Unsalted nut butters. Dairy Milk. Soy milk. Cheese that is naturally low in sodium, such as ricotta cheese, fresh mozzarella, or Swiss cheese. Low-sodium or reduced-sodium cheese. Cream cheese. Yogurt. Seasonings and condiments Fresh and dried herbs and spices. Salt-free seasonings. Low-sodium mustard and ketchup. Sodium-free salad dressing. Sodium-free light mayonnaise. Fresh or refrigerated horseradish. Lemon juice. Vinegar. Other foods Homemade, reduced-sodium, or low-sodium soups. Unsalted popcorn and pretzels. Low-salt or salt-free chips. The items listed above may not be a complete list of foods and beverages you can eat. Contact a dietitian for more information. What foods should I avoid? Vegetables Sauerkraut, pickled vegetables, and relishes. Olives. [place] fries. Onion  rings. Regular canned vegetables (not low-sodium or reduced-sodium). Regular canned tomato sauce and paste (not low-sodium or reduced-sodium). Regular tomato and vegetable juice (not low-sodium or reduced-sodium). Frozen vegetables in sauces. Grains Instant hot cereals. Bread stuffing, pancake, and biscuit mixes. Croutons. Seasoned rice or pasta mixes. Noodle soup cups. Boxed or frozen macaroni and cheese. Regular salted crackers. Self-rising flour. Meats and other proteins Meat or fish that is salted, canned, smoked, spiced, or pickled. Precooked or cured meat, such as sausages or meat loaves. Berniece Salines. Ham. Pepperoni. Hot dogs. Corned beef. Chipped beef. Salt pork. Jerky. Pickled herring. Anchovies and sardines. Regular canned tuna. Salted nuts. Dairy Processed cheese and cheese spreads. Hard cheeses. Cheese curds. Blue cheese. Feta cheese. String cheese. Regular cottage cheese. Buttermilk. Canned milk. Fats and oils Salted butter. Regular margarine. Ghee. Bacon fat. Seasonings and condiments Onion salt, garlic salt, seasoned salt, table salt, and sea salt. Canned and packaged gravies. Worcestershire sauce. Tartar sauce. Barbecue sauce. Teriyaki sauce. Soy sauce, including reduced-sodium. Steak sauce. Fish sauce. Oyster sauce. Cocktail sauce. Horseradish that you find on the shelf. Regular ketchup and mustard. Meat flavorings and tenderizers. Bouillon cubes. Hot sauce. Pre-made or packaged marinades. Pre-made or packaged taco seasonings. Relishes. Regular  salad dressings. Salsa. Other foods Salted popcorn and pretzels. Corn chips and puffs. Potato and tortilla chips. Canned or dried soups. Pizza. Frozen entrees and pot pies. The items listed above may not be a complete list of foods and beverages you should avoid. Contact a dietitian for more information. Summary Eating less sodium can help lower your blood pressure, reduce swelling, and protect your heart, liver, and kidneys. Most people on this plan  should limit their sodium intake to 1,500-2,000 mg (milligrams) of sodium each day. Canned, boxed, and frozen foods are high in sodium. Restaurant foods, fast foods, and pizza are also very high in sodium. You also get sodium by adding salt to food. Try to cook at home, eat more fresh fruits and vegetables, and eat less fast food and canned, processed, or prepared foods. This information is not intended to replace advice given to you by your health care provider. Make sure you discuss any questions you have with your health care provider. Document Revised: 12/07/2019 Document Reviewed: 10/03/2019 Elsevier Patient Education  2022 [place]   [name] [facility], BSN RN Case Manager [facility] [phone]

## 2021-09-30 ENCOUNTER — Other Ambulatory Visit: Payer: Self-pay

## 2021-09-30 ENCOUNTER — Ambulatory Visit
Admission: RE | Admit: 2021-09-30 | Discharge: 2021-09-30 | Disposition: A | Discharge status: Home / Self Care | Payer: Medicare HMO | Source: Ambulatory Visit | Attending: Oncology | Admitting: Oncology

## 2021-09-30 DIAGNOSIS — C61 Malignant neoplasm of prostate: Secondary | ICD-10-CM | POA: Diagnosis not present

## 2021-09-30 HISTORY — DX: Disorder of kidney and ureter, unspecified: N28.9

## 2021-09-30 LAB — POCT I-STAT CREATININE: Creatinine, Ser: 2.2 mg/dL — ABNORMAL HIGH (ref 0.61–1.24)

## 2021-09-30 MED ORDER — IOHEXOL 300 MG/ML  SOLN
100.0000 mL | Freq: Once | INTRAMUSCULAR | Status: AC | PRN
  Administered 2021-09-30: 80 mL via INTRAVENOUS

## 2021-10-10 IMAGING — DX DG CHEST 1V
1 series · 1 of 1 positions shown · non-contrast
Comparison: May 01, 2006

CLINICAL DATA: Weakness.

EXAM:
CHEST  1 VIEW

[chest ap]
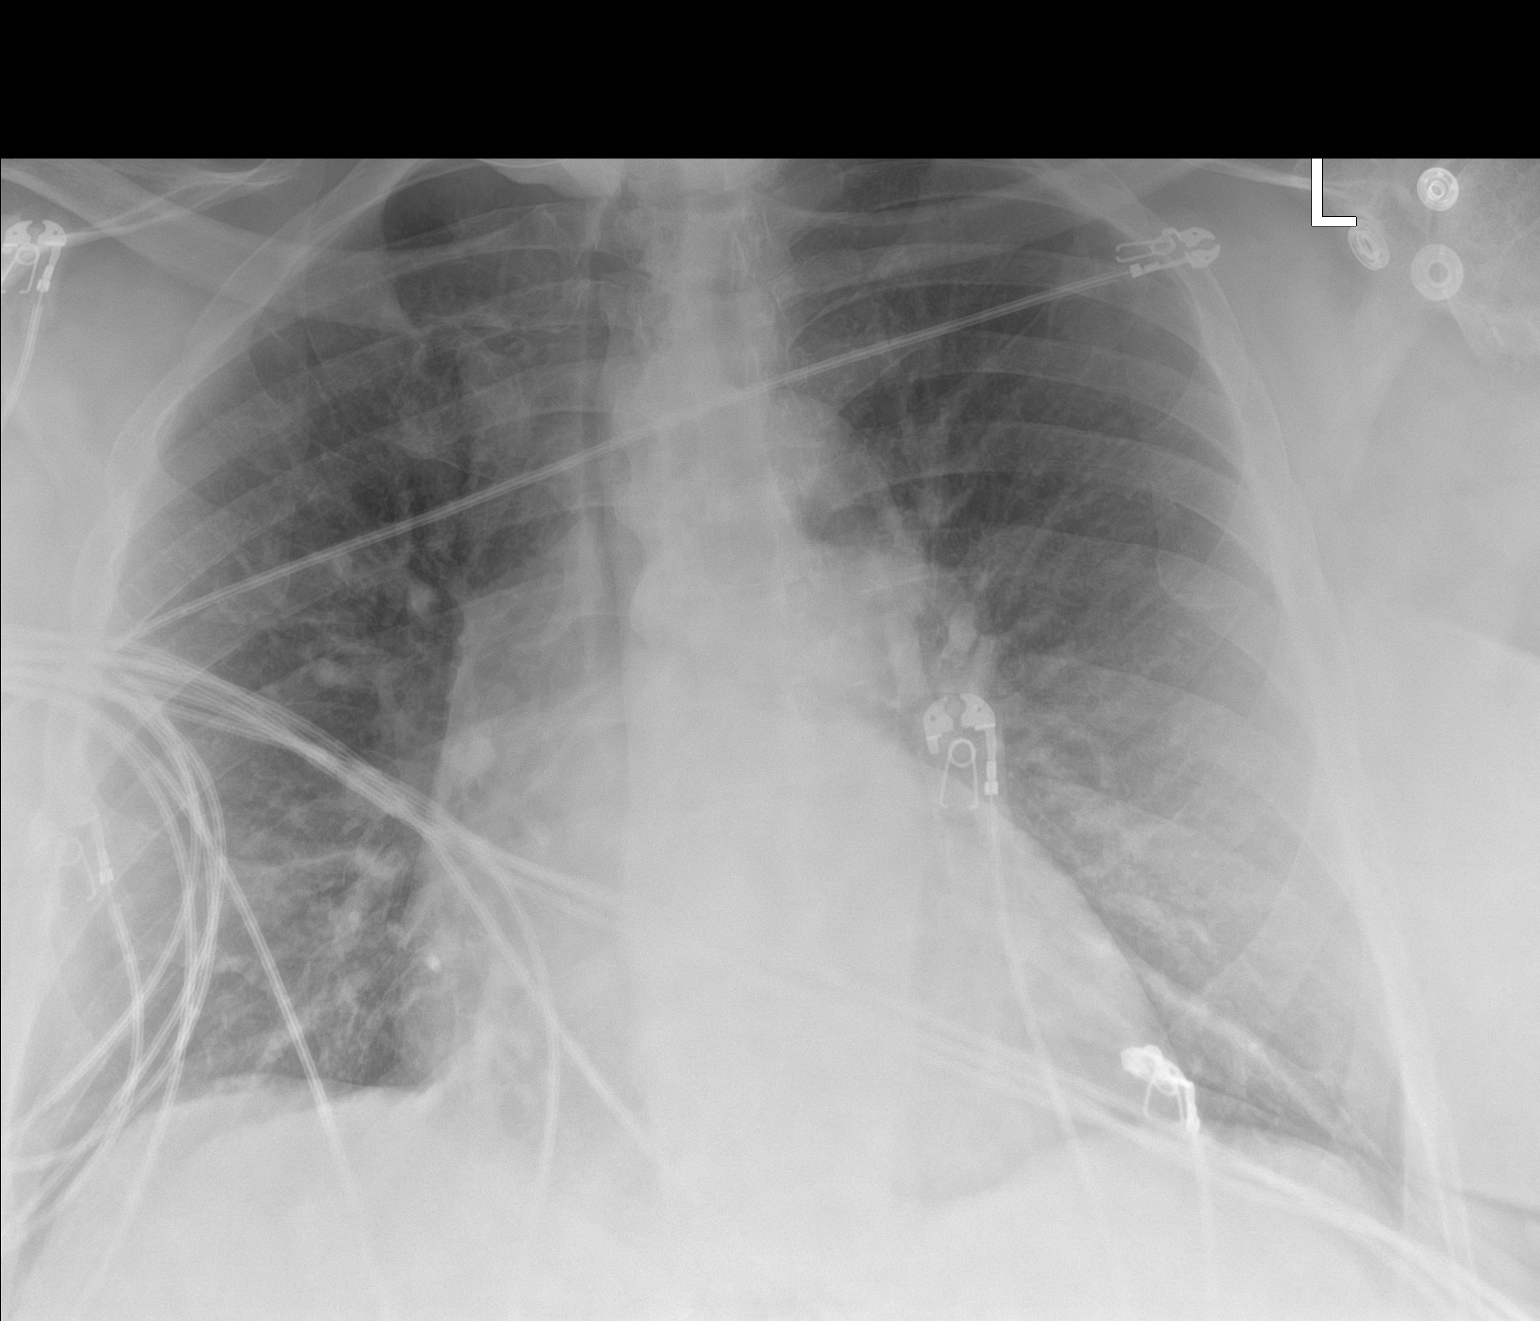

[1 of 1 positions shown; findings below may reference images not displayed]

FINDINGS: There is no evidence of acute infiltrate, pleural effusion or
pneumothorax. Mild suprahilar and infrahilar prominence of the
pulmonary vasculature is seen. The cardiac silhouette is mildly
enlarged. Degenerative changes seen throughout the thoracic spine.
IMPRESSION: Mild cardiomegaly with mild pulmonary vascular congestion.

## 2021-10-11 IMAGING — CT CT ABD-PELV W/O CM
2 of 4 series · 17 of 46 positions shown, 19 images · non-contrast
Comparison: 08/21/2020

CLINICAL DATA: Acute nonlocalized abdominal pain.

EXAM:
CT ABDOMEN AND PELVIS WITHOUT CONTRAST
TECHNIQUE: Multidetector CT imaging of the abdomen and pelvis was performed
following the standard protocol without IV contrast.

[Series 4: ap without · axial · non-contrast · 0.95mm/px · z∈[+704,+1104]mm · 14 of 90 slices shown, 16 images]
[im 5/90  soft-tissue]
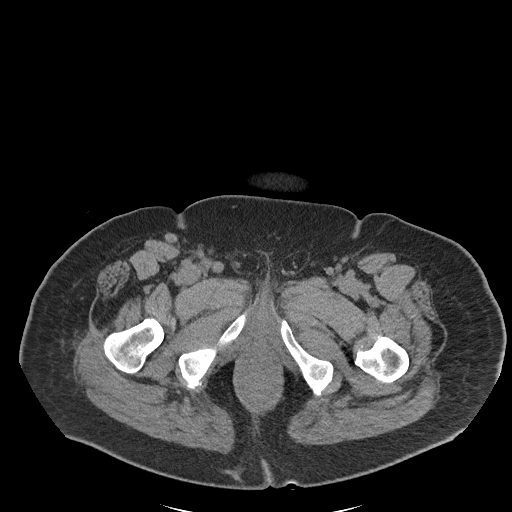
[im 5/90  bone]
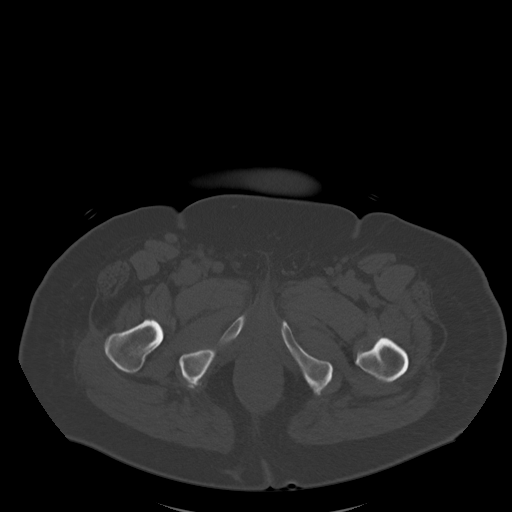
[im 10/90  soft-tissue]
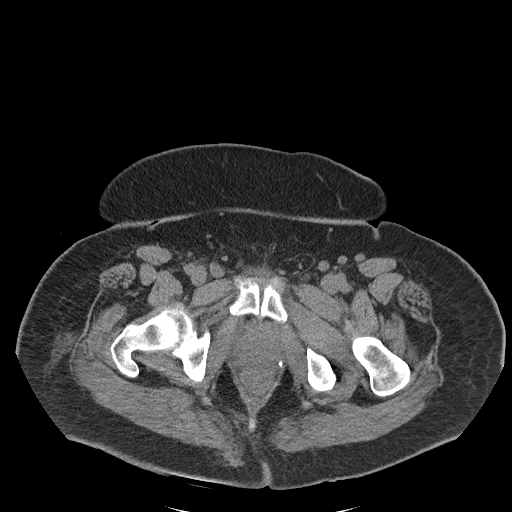
[im 20/90  soft-tissue]
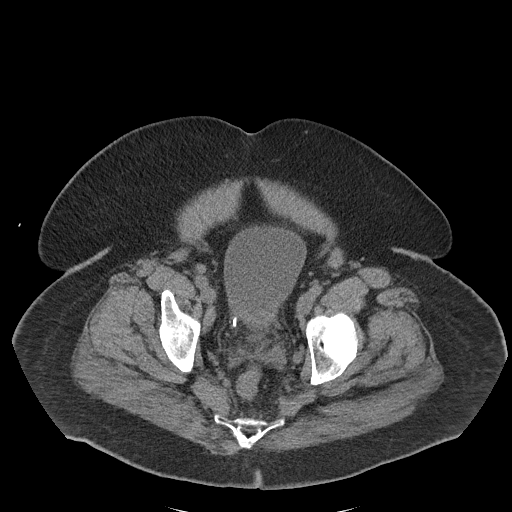
[im 25/90  soft-tissue]
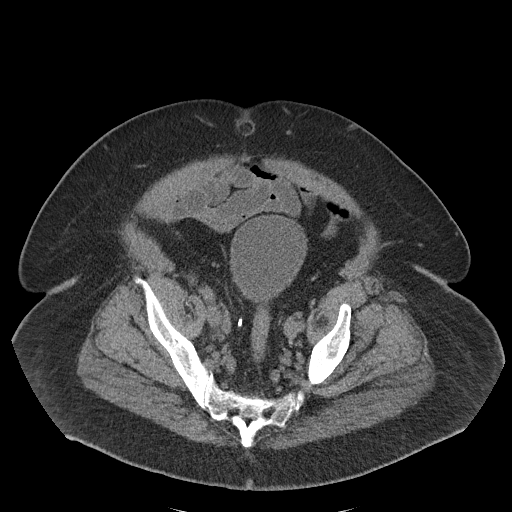
[im 30/90  soft-tissue]
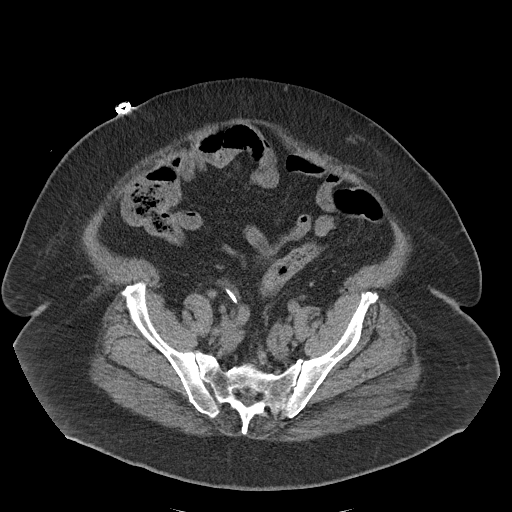
[im 35/90  soft-tissue]
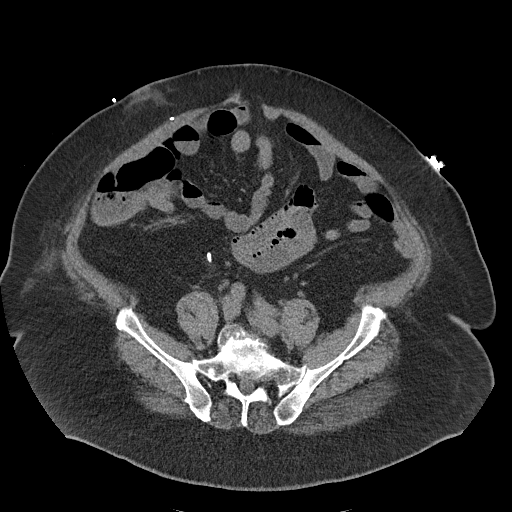
[im 40/90  soft-tissue]
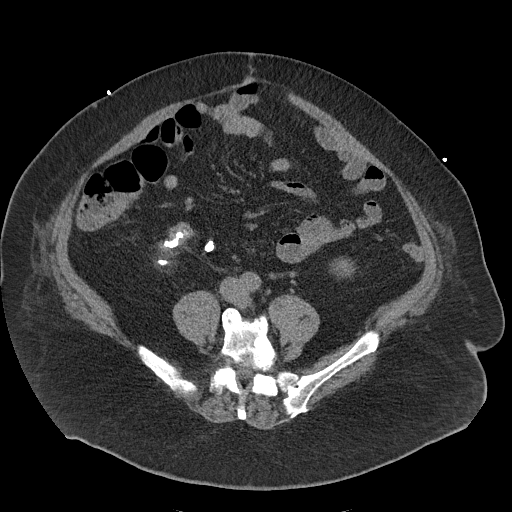
[im 50/90  soft-tissue]
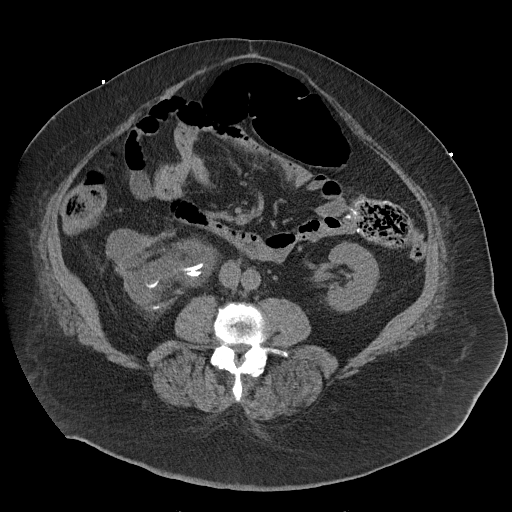
[im 55/90  soft-tissue]
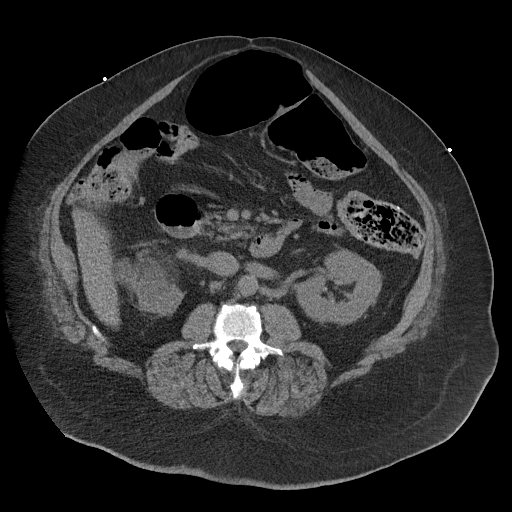
[im 55/90  bone]
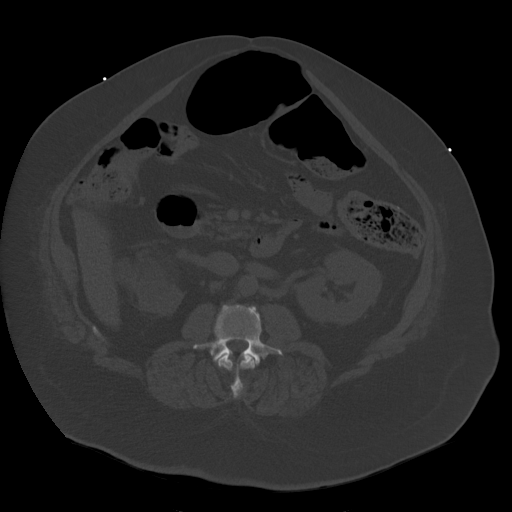
[im 60/90  soft-tissue]
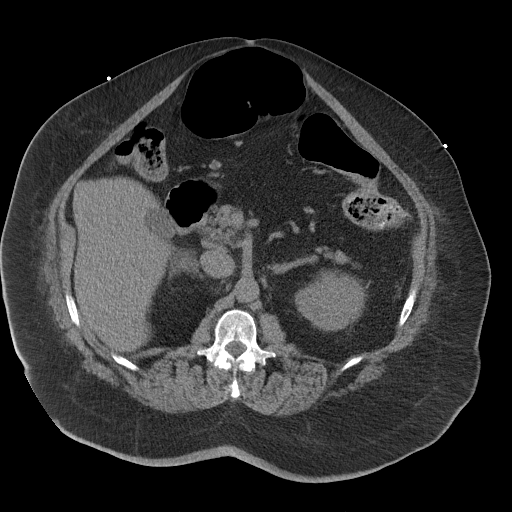
[im 65/90  soft-tissue]
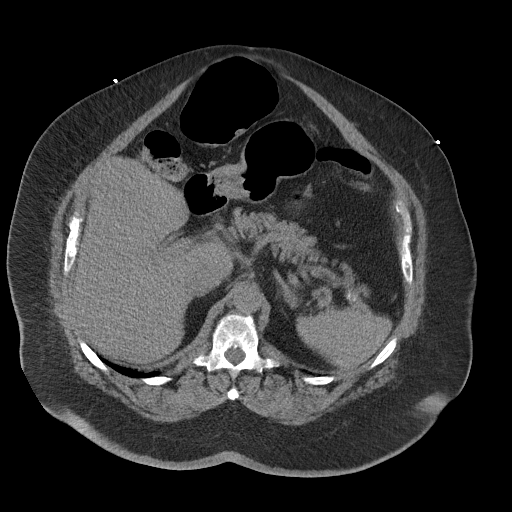
[im 70/90  soft-tissue]
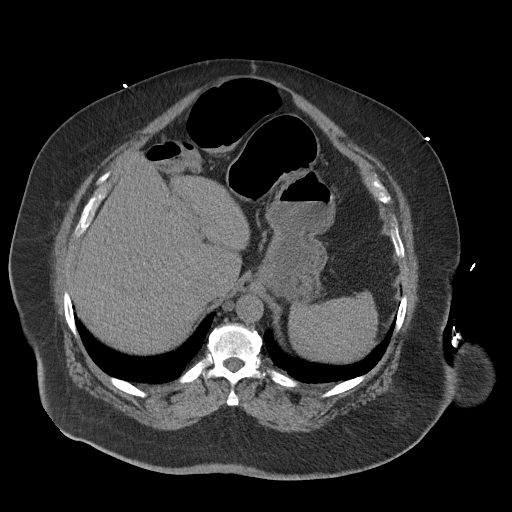
[im 80/90  soft-tissue]
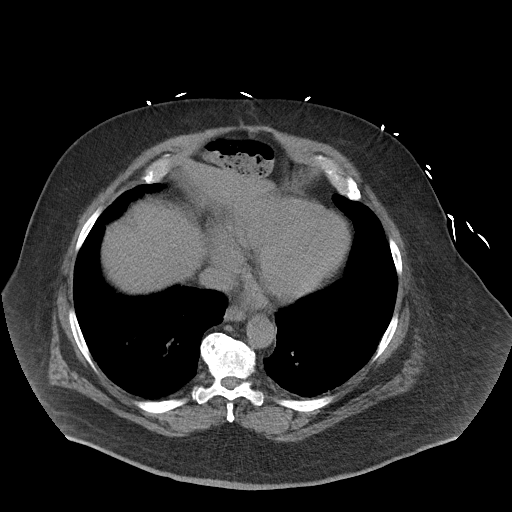
[im 85/90  soft-tissue]
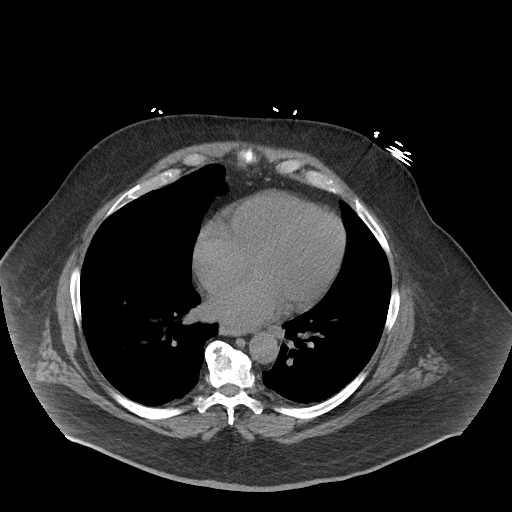

[Series 6: cor · coronal · 0.94mm/px · 3 of 145 slices shown]
[im 49/145  soft-tissue]
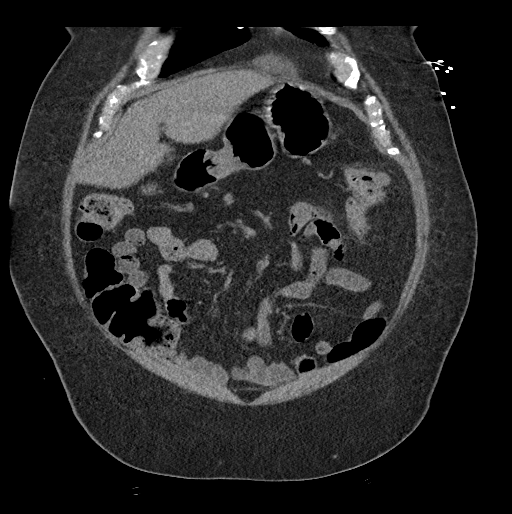
[im 65/145  soft-tissue]
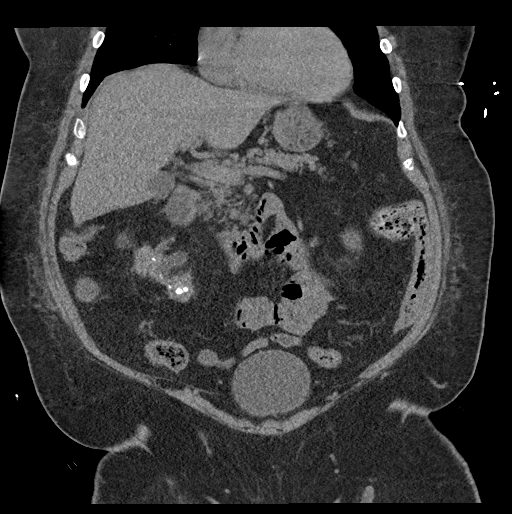
[im 81/145  soft-tissue]
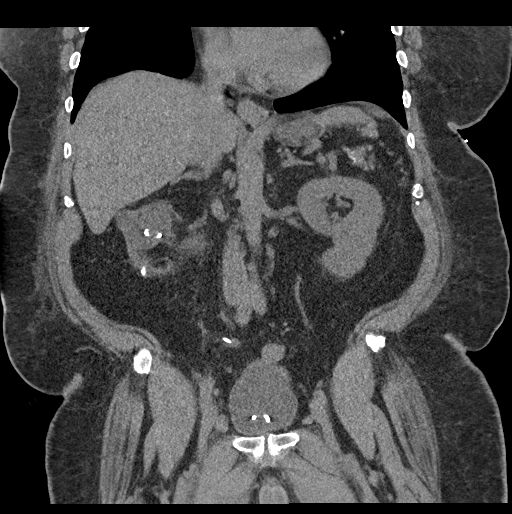

[17 of 46 positions shown; findings below may reference images not displayed]

FINDINGS: Lower chest:  No contributory findings.

Hepatobiliary: No focal liver abnormality.No evidence of biliary
obstruction or stone.

Pancreas: Unremarkable.

Spleen: Unremarkable.

Adrenals/Urinary Tract: Negative adrenals. Stented right kidney with
severe scarring and multifocal lower pole moiety calculi. There are
superimposed renal cysts. The intrarenal collecting system is
similarly patulous to prior. A stone is seen in the upper ureter
adjacent to the stent, 7 mm in diameter. Small left upper and lower
pole renal calculi. Unremarkable bladder.

Stomach/Bowel: No obstruction. No appendicitis. Bowel anastomosis in
the left abdomen.

Vascular/Lymphatic: No acute vascular abnormality. Stable mild
enlargement of pelvic lymph nodes measuring up to 14 mm short axis
at the right node of [place]

Reproductive:Symmetric enlargement of the prostate. There is a
reported history of prostate cancer on prior imaging.

Other: No ascites or pneumoperitoneum.

Musculoskeletal: No acute abnormalities. Scar-like appearance in the
right gluteal region. There are a few sclerotic areas most notably
seen in the L5 left body. Bone scan was performed August 2020.
Spondylosis with multi-level bridging. Advanced lower lumbar
degenerative disease.
IMPRESSION: 1. No acute finding or change from August 2020.
2. Chronic hydronephrosis and urothelial thickening on the right
with stable stent positioning. Multiple right renal and ureteral
calculi.
3. Small left renal calculi.
4. History of prostate cancer with stable pelvic adenopathy.

## 2021-10-11 IMAGING — DX DG CHEST 1V PORT
1 series · 1 of 1 positions shown · non-contrast
Comparison: October 23, 2020.

CLINICAL DATA: Weakness.

EXAM:
PORTABLE CHEST 1 VIEW

[chest ap]
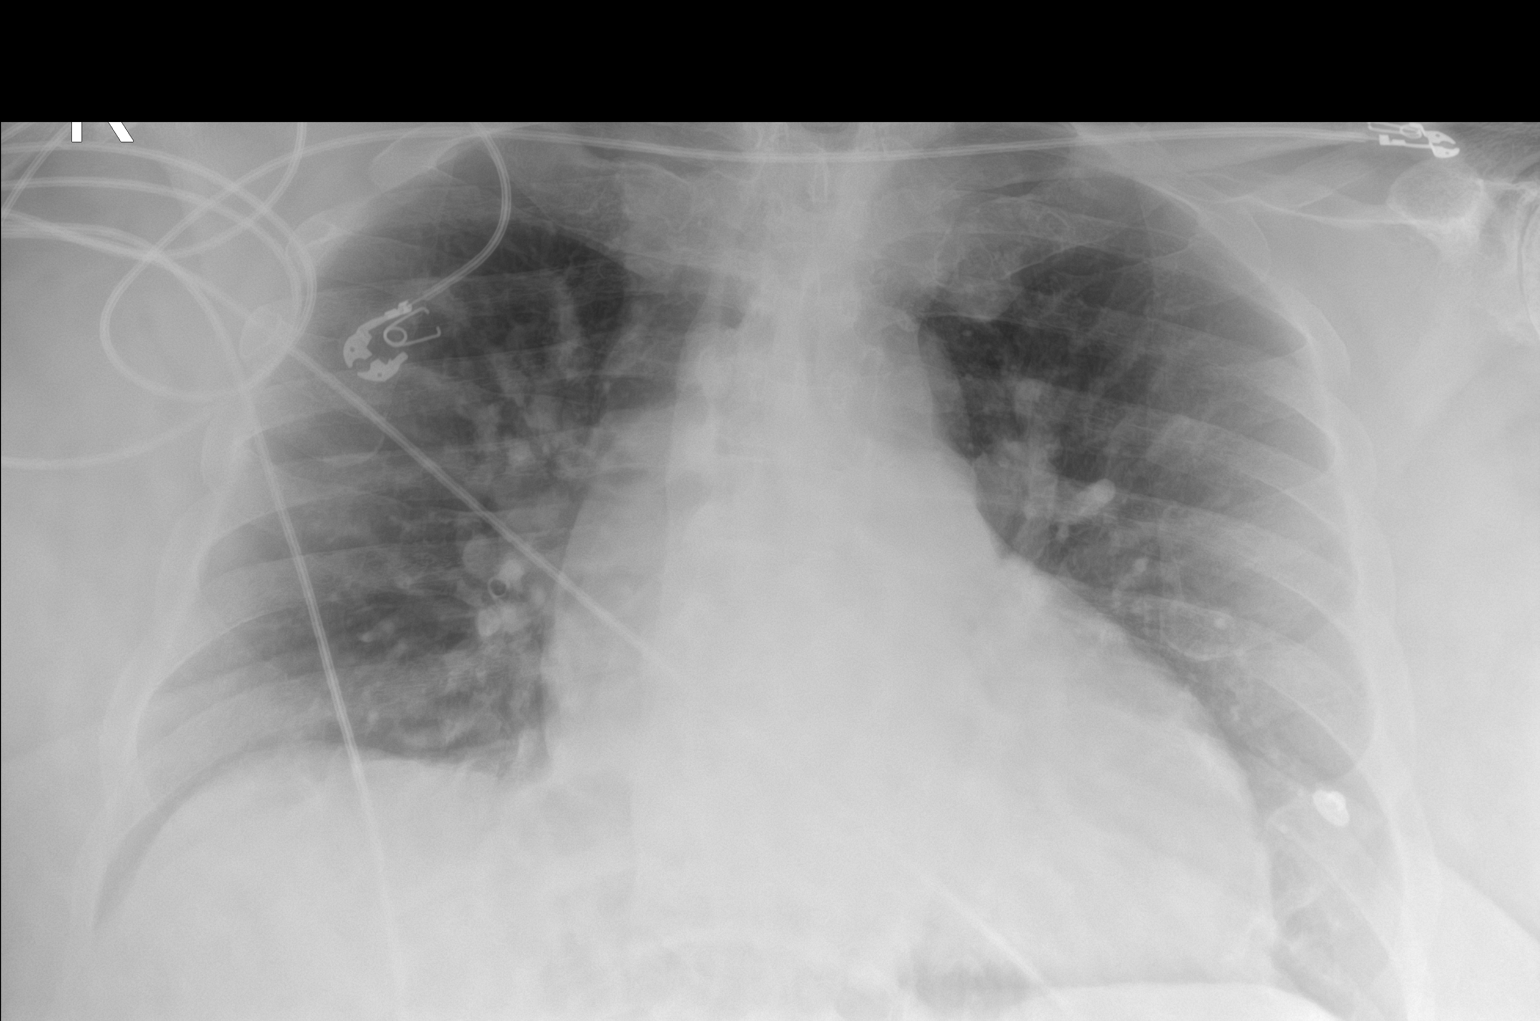

[1 of 1 positions shown; findings below may reference images not displayed]

FINDINGS: Stable mild cardiomegaly is noted with probable central pulmonary
vascular congestion. No pneumothorax or pleural effusion is noted.
Both lungs are clear. The visualized skeletal structures are
unremarkable.
IMPRESSION: Stable mild cardiomegaly with probable central pulmonary vascular
congestion.

## 2021-10-14 DIAGNOSIS — I5032 Chronic diastolic (congestive) heart failure: Secondary | ICD-10-CM | POA: Diagnosis not present

## 2021-10-14 DIAGNOSIS — I110 Hypertensive heart disease with heart failure: Secondary | ICD-10-CM

## 2021-10-16 DIAGNOSIS — L97812 Non-pressure chronic ulcer of other part of right lower leg with fat layer exposed: Secondary | ICD-10-CM | POA: Diagnosis not present

## 2021-10-16 DIAGNOSIS — E1122 Type 2 diabetes mellitus with diabetic chronic kidney disease: Secondary | ICD-10-CM

## 2021-10-16 DIAGNOSIS — E1151 Type 2 diabetes mellitus with diabetic peripheral angiopathy without gangrene: Secondary | ICD-10-CM | POA: Diagnosis not present

## 2021-10-16 DIAGNOSIS — I872 Venous insufficiency (chronic) (peripheral): Secondary | ICD-10-CM | POA: Diagnosis not present

## 2021-10-16 DIAGNOSIS — I890 Lymphedema, not elsewhere classified: Secondary | ICD-10-CM | POA: Diagnosis not present

## 2021-10-16 DIAGNOSIS — L97822 Non-pressure chronic ulcer of other part of left lower leg with fat layer exposed: Secondary | ICD-10-CM

## 2021-10-16 DIAGNOSIS — I130 Hypertensive heart and chronic kidney disease with heart failure and stage 1 through stage 4 chronic kidney disease, or unspecified chronic kidney disease: Secondary | ICD-10-CM

## 2021-10-16 DIAGNOSIS — I5032 Chronic diastolic (congestive) heart failure: Secondary | ICD-10-CM

## 2021-10-28 ENCOUNTER — Telehealth: Payer: Medicare HMO

## 2021-10-30 ENCOUNTER — Ambulatory Visit (INDEPENDENT_AMBULATORY_CARE_PROVIDER_SITE_OTHER): Payer: Medicare HMO | Admitting: *Deleted

## 2021-10-30 DIAGNOSIS — I5032 Chronic diastolic (congestive) heart failure: Secondary | ICD-10-CM

## 2021-10-30 DIAGNOSIS — I10 Essential (primary) hypertension: Secondary | ICD-10-CM

## 2021-10-30 NOTE — Chronic Care Management (AMB) (Signed)
Chronic Care Management   CCM RN Visit Note  10/30/2021 Name: Terry [name] MRN: [id number] DOB: Mar 19, 1941  Subjective: Terry [name] is a 80 y.o. year old male who is a primary care patient of [name], Terry Mcgee, MD. The care management team was consulted for assistance with disease management and care coordination needs.    Engaged with patient by telephone for follow up visit in response to provider referral for case management and/or care coordination services.   Consent to Services:  The patient was given information about Chronic Care Management services, agreed to services, and gave verbal consent prior to initiation of services.  Please see initial visit note for detailed documentation.   Patient agreed to services and verbal consent obtained.   Assessment: Review of patient past medical history, allergies, medications, health status, including review of consultants reports, laboratory and other test data, was performed as part of comprehensive evaluation and provision of chronic care management services.   SDOH (Social Determinants of Health) assessments and interventions performed:    CCM Care Plan  Allergies  Allergen Reactions   Lisinopril     FACIAL SWELLING    Outpatient Encounter Medications as of 10/30/2021  Medication Sig   apixaban (ELIQUIS) 5 MG TABS tablet Take 1 tablet (5 mg total) by mouth 2 (two) times daily.   carvedilol (COREG) 25 MG tablet Take 1 tablet (25 mg total) by mouth 2 (two) times daily with a meal.   furosemide (LASIX) 20 MG tablet Take 1 tablet (20 mg total) by mouth daily.   hydrochlorothiazide (HYDRODIURIL) 25 MG tablet Take 1 tablet (25 mg total) by mouth daily.   levothyroxine (SYNTHROID) 25 MCG tablet Take 1 tablet (25 mcg total) by mouth daily before breakfast.   mirabegron ER (MYRBETRIQ) 50 MG TB24 tablet Take 1 tablet (50 mg total) by mouth daily.   Multiple Vitamins-Minerals (MULTIVITAMINS THER. W/MINERALS) TABS Take 1 tablet by mouth  daily.   polyethylene glycol (MIRALAX / GLYCOLAX) 17 g packet Take 17 g by mouth daily as needed for mild constipation.   senna-docusate (SENOKOT-S) 8.6-50 MG tablet Take 2 tablets by mouth at bedtime as needed for mild constipation.   spironolactone (ALDACTONE) 25 MG tablet Take 1 tablet (25 mg total) by mouth daily.   tamsulosin (FLOMAX) 0.4 MG CAPS capsule Take 1 capsule (0.4 mg total) by mouth daily.   telmisartan (MICARDIS) 40 MG tablet Take 1 tablet (40 mg total) by mouth daily.   allopurinol (ZYLOPRIM) 300 MG tablet Take 1 tablet (300 mg total) by mouth daily. (Patient not taking: Reported on 08/28/2021)   No facility-administered encounter medications on file as of 10/30/2021.    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[place]) 10/18/2017   Right ureteral stone 10/01/2017   Iron deficiency anemia due to chronic blood loss 06/22/2016   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   Depression with anxiety 06/19/2015   CKD (chronic kidney disease) stage  3, GFR 30-59 ml/min (HCC) 05/26/2015   Essential hypertension 11/01/2014   Gouty arthritis of toe 11/01/2014   Morbid obesity due to excess calories ([place]) 11/01/2014   Primary osteoarthritis of both knees 11/01/2014    Conditions to be addressed/monitored:CHF and HTN  Care Plan : RN Care Manager plan of care  Updates made by Kassie Mends, RN since  10/30/2021 12:00 AM     Problem: No plan of care established for management of chronic disease states (CHF, HTN, cancer)   Priority: High     Long-Range Goal: Development of plan of care for chronic disease management (CHF, HTN, cancer)   Start Date: 09/28/2021  Expected End Date: 03/27/2022  Priority: High  Note:    Current Barriers:  Knowledge Deficits related to plan of care for management of CHF and HTN  Chronic Disease Management support and education needs related to CHF and HTN Patient reports his adult son lives with him and provides assistance if needed, has adult daughter Terry [name] [facility]), patient reports he still drives, is overall independent with aspects of his care, requires assistance getting up steps, uses shower seat for bathing.  Patient reports he has prostate cancer and with last scan/ tests, etc reports " things are about the same, stabilized for now", sees oncologist Dr. Randa Evens, had CT scan 09/30/21 but has not received a report on this.  Patient reports he has a scale but does not weight often, has blood pressure cuff and checks blood pressure " on occasion"  Patient reports he does not adhere to a special diet but tries " to eat as healthy as I can"   RNCM Clinical Goal(s):  Patient will verbalize understanding of plan for management of CHF and HTN as evidenced by patient report, EHR review take all medications exactly as prescribed and will call provider for medication related questions as evidenced by patient report, EHR review, collaboration with CCM pharmacist    attend all scheduled medical appointments: 12/10/21 with CCM pharmacist as evidenced by patient report, EHR review and        through collaboration with RN Care manager, provider, and care team.   Interventions: 1:1 collaboration with primary care provider regarding development and update of comprehensive plan of care as evidenced by provider attestation and co-signature Inter-disciplinary care team  collaboration (see longitudinal plan of care) Evaluation of current treatment plan related to  self management and patient's adherence to plan as established by provider  Hypertension Interventions: Last practice recorded BP readings:  BP Readings from Last 3 Encounters:  09/23/21 (!) 142/71  08/28/21 (!) 145/71  08/27/21 (!) 160/68  Most recent eGFR/CrCl:  Lab Results  Component Value Date   EGFR 43 (L) 08/27/2021    No components found for: CRCL  Evaluation of current treatment plan related to hypertension self management and patient's adherence to plan as established by provider Reviewed medications with patient and discussed importance of compliance Discussed plans with patient for ongoing care management follow up and provided patient with direct contact information for care management team Advised patient, providing education and rationale, to monitor blood pressure daily and record, calling PCP for findings outside established parameters Discussed complications of poorly controlled blood pressure such as heart disease, stroke, circulatory complications, vision complications, kidney impairment, sexual dysfunction Reinforced low sodium diet Reviewed upcoming scheduled appointments Reinforced good handwashing and wearing a mask as needed  Heart Failure Interventions: Discussed importance of daily weight and advised patient to weigh and record daily Discussed the  importance of keeping all appointments with provider Provided patient with education about the role of exercise in the management of heart failure Reinforced heart failure action plan, importance of calling doctor early for change in healthy status, symptoms   Patient Goals/Self-Care Activities: Take medications as prescribed   Attend all scheduled provider appointments Perform all self care activities independently  Perform IADL's (shopping, preparing meals, housekeeping, managing finances) independently Call provider  office for new concerns or questions  call office if I gain more than 2 pounds in one day or 5 pounds in one week keep legs up while sitting track weight in diary weigh myself daily follow rescue plan if symptoms flare-up dress right for the weather, hot or cold check blood pressure 3 times per week write blood pressure results in a log or diary take blood pressure log to all doctor appointments develop an action plan for high blood pressure take medications for blood pressure exactly as prescribed Practice good handwashing, wear a mask as needed Call RN care manager if you have any questions at [phone]   Follow Up Plan:  Telephone outreach call scheduled for:  10/30/2021      Plan:Telephone follow up appointment with care management team member scheduled for:  12/11/2021  [name] [facility], BSN RN Case Manager [facility] [phone]

## 2021-10-30 NOTE — Patient Instructions (Signed)
Visit Information  Thank you for taking time to visit with me today. Please don't hesitate to contact me if I can be of assistance to you before our next scheduled telephone appointment.  Following are the goals we discussed today:  Take medications as prescribed   Attend all scheduled provider appointments Perform all self care activities independently  Perform IADL's (shopping, preparing meals, housekeeping, managing finances) independently Call provider office for new concerns or questions  call office if I gain more than 2 pounds in one day or 5 pounds in one week keep legs up while sitting track weight in diary weigh myself daily follow rescue plan if symptoms flare-up dress right for the weather, hot or cold check blood pressure 3 times per week write blood pressure results in a log or diary take blood pressure log to all doctor appointments develop an action plan for high blood pressure take medications for blood pressure exactly as prescribed Practice good handwashing, wear a mask as needed Call RN care manager if you have any questions at [phone]  Our next appointment is by telephone on 12/11/21 at 9 am  Please call the care guide team at [phone] if you need to cancel or reschedule your appointment.   If you are experiencing a Mental Health or [place] or need someone to talk to, please    Patient verbalizes understanding of instructions provided today and agrees to view in [place].   [name] RNC, BSN RN Case Manager [facility] [phone]

## 2021-11-14 DIAGNOSIS — I5032 Chronic diastolic (congestive) heart failure: Secondary | ICD-10-CM

## 2021-11-14 DIAGNOSIS — I10 Essential (primary) hypertension: Secondary | ICD-10-CM | POA: Diagnosis not present

## 2021-11-19 ENCOUNTER — Telehealth: Payer: Self-pay | Admitting: Family Medicine

## 2021-11-19 NOTE — Telephone Encounter (Signed)
Left message for patient's daughter, [name],  to call back and schedule Medicare Annual Wellness Visit (AWV) in office.   If not able to come in office, please offer to do virtually or by telephone.  Left office number and my jabber [phone].  Due for AWVI  Please schedule at anytime with Nurse Health Advisor.

## 2021-11-27 NOTE — Progress Notes (Deleted)
Chronic Care Management Pharmacy Note  11/27/2021 Name:  Terry [name] MRN:  [id number] DOB:  Oct 02, 1941  Summary: ***  Recommendations/Changes made from today's visit: ***  Plan: ***   Subjective: Terry [name] is an 81 y.o. year old male who is a primary patient of Pickard, [name], MD.  The CCM team was consulted for assistance with disease management and care coordination needs.    {CCMTELEPHONEFACETOFACE:21091510} for {CCMINITIALFOLLOWUPCHOICE:21091511} in response to provider referral for pharmacy case management and/or care coordination services.   Consent to Services:  {CCMCONSENTOPTIONS:25074}  Patient Care Team: Susy Frizzle, MD as PCP - General (Family Medicine) Minna Merritts, MD as Consulting Physician (Cardiology) Kassie Mends, RN as Case Manager Deirdre Peer, LCSW as Social Worker Rosana Hoes, Jerilynn Mages, [facility] as Pharmacist (Pharmacist)  Recent office visits: ***  Recent consult visits: [facility] visits: {Hospital DC Yes/No:25215}   Objective:  Lab Results  Component Value Date   CREATININE 2.20 (H) 09/30/2021   BUN 32 (H) 08/27/2021   GFRNONAA 51 (L) 01/08/2021   GFRAA 59 (L) 01/08/2021   NA 145 08/27/2021   K 4.2 08/27/2021   CALCIUM 9.7 08/27/2021   CO2 21 08/27/2021   GLUCOSE 92 08/27/2021    Lab Results  Component Value Date/Time   HGBA1C 6.0 (H) 08/27/2021 04:48 PM   HGBA1C 5.8 (H) 01/08/2021 02:22 PM    Last diabetic Eye exam: No results found for: HMDIABEYEEXA  Last diabetic Foot exam: No results found for: HMDIABFOOTEX   Lab Results  Component Value Date   CHOL 112 06/25/2020   HDL 35 (L) 06/25/2020   LDLCALC 63 06/25/2020   TRIG 65 06/25/2020   CHOLHDL 3.2 06/25/2020    Hepatic Function Latest Ref Rng & Units 08/27/2021 05/28/2021 01/08/2021  Total Protein 6.1 - 8.1 g/dL 7.9 8.1 8.0  Albumin 3.5 - 5.0 g/dL - - -  AST 10 - 35 U/L _0 ALT 9 - 46 U/L 8(L) 8(L) 11  Alk Phosphatase 38 - 126 U/L - - -   Total Bilirubin 0.2 - 1.2 mg/dL 0.2 0.3 0.4    Lab Results  Component Value Date/Time   TSH 4.04 08/27/2021 04:48 PM   TSH 4.48 01/08/2021 02:22 PM   FREET4 1.2 06/25/2020 11:44 AM    CBC Latest Ref Rng & Units 08/27/2021 05/28/2021 01/08/2021  WBC 3.8 - 10.8 Thousand/uL 7.5 6.3 6.9  Hemoglobin 13.2 - 17.1 g/dL 12.7(L) 12.0(L) 12.2(L)  Hematocrit 38.5 - 50.0 % 40.4 39.0 37.8(L)  Platelets 140 - 400 Thousand/uL 208 207 200    No results found for: VD25OH  Clinical ASCVD: {YES/NO:21197} The ASCVD Risk score (Arnett DK, et al., 2019) failed to calculate for the following reasons:   The 2019 ASCVD risk score is only valid for ages 3 to 39    Depression screen PHQ 2/9 09/28/2021 09/28/2021 01/08/2021  Decreased Interest 0 0 0  Down, Depressed, Hopeless 0 0 0  PHQ - 2 Score 0 0 0     ***Other: (CHADS2VASc if Afib, MMRC or CAT for COPD, ACT, DEXA)  Social History   Tobacco Use  Smoking Status Never  Smokeless Tobacco Never   BP Readings from Last 3 Encounters:  09/23/21 (!) 142/71  08/28/21 (!) 145/71  08/27/21 (!) 160/68   Pulse Readings from Last 3 Encounters:  09/23/21 65  08/28/21 65  08/27/21 96   Wt Readings from Last 3 Encounters:  09/23/21 (!) 301 lb (136.5 kg)  08/28/21 Marland Kitchen)  307 lb 12.8 oz (139.6 kg)  08/27/21 (!) 306 lb (138.8 kg)   BMI Readings from Last 3 Encounters:  09/23/21 47.14 kg/m  08/28/21 48.21 kg/m  08/27/21 47.93 kg/m    Assessment/Interventions: Review of patient past medical history, allergies, medications, health status, including review of consultants reports, laboratory and other test data, was performed as part of comprehensive evaluation and provision of chronic care management services.   SDOH:  (Social Determinants of Health) assessments and interventions performed: {yes/no:20286}  SDOH Screenings   Alcohol Screen: Not on file  Depression (PHQ2-9): Low Risk    PHQ-2 Score: 0  Financial Resource Strain: Not on file  Food  Insecurity: No Food Insecurity   Worried About Charity fundraiser in the Last Year: Never true   Ran Out of Food in the Last Year: Never true  Housing: Not on file  Physical Activity: Not on file  Social Connections: Not on file  Stress: Not on file  Tobacco Use: Low Risk    Smoking Tobacco Use: Never   Smokeless Tobacco Use: Never   Passive Exposure: Not on file  Transportation Needs: No Transportation Needs   Lack of Transportation (Medical): No   Lack of Transportation (Non-Medical): No    CCM Care Plan  Allergies  Allergen Reactions   Lisinopril     FACIAL SWELLING    Medications Reviewed Today     Reviewed by Kassie Mends, RN (Registered Nurse) on 10/30/21 at 43  Med List Status: <None>   Medication Order Taking? Sig Documenting Provider Last Dose Status Informant  allopurinol (ZYLOPRIM) 300 MG tablet [id number] No Take 1 tablet (300 mg total) by mouth daily.  Patient not taking: Reported on 08/28/2021   Susy Frizzle, MD Not Taking Active   apixaban (ELIQUIS) 5 MG TABS tablet [id number] Yes Take 1 tablet (5 mg total) by mouth 2 (two) times daily. Susy Frizzle, MD Taking Active   carvedilol (COREG) 25 MG tablet [id number] Yes Take 1 tablet (25 mg total) by mouth 2 (two) times daily with a meal. Susy Frizzle, MD Taking Active   furosemide (LASIX) 20 MG tablet [id number] Yes Take 1 tablet (20 mg total) by mouth daily. Susy Frizzle, MD Taking Active   hydrochlorothiazide (HYDRODIURIL) 25 MG tablet [id number] Yes Take 1 tablet (25 mg total) by mouth daily. Susy Frizzle, MD Taking Active   levothyroxine (SYNTHROID) 25 MCG tablet [id number] Yes Take 1 tablet (25 mcg total) by mouth daily before breakfast. Susy Frizzle, MD Taking Active   mirabegron ER (MYRBETRIQ) 50 MG TB24 tablet [id number] Yes Take 1 tablet (50 mg total) by mouth daily. Susy Frizzle, MD Taking Active   Multiple Vitamins-Minerals (MULTIVITAMINS THER. W/MINERALS) Sheral Flow [id number] Yes  Take 1 tablet by mouth daily. [provider] Taking Active Self  polyethylene glycol (MIRALAX / GLYCOLAX) 17 g packet [id number] Yes Take 17 g by mouth daily as needed for mild constipation. Jonetta Osgood, MD Taking Active   senna-docusate (SENOKOT-S) 8.6-50 MG tablet [id number] Yes Take 2 tablets by mouth at bedtime as needed for mild constipation. Susy Frizzle, MD Taking Active   spironolactone (ALDACTONE) 25 MG tablet [id number] Yes Take 1 tablet (25 mg total) by mouth daily. Susy Frizzle, MD Taking Active   tamsulosin [facility]) 0.4 MG CAPS capsule [id number] Yes Take 1 capsule (0.4 mg total) by mouth daily. Susy Frizzle, MD Taking Active   telmisartan (MICARDIS) 40 MG tablet [id number] Yes Take 1  tablet (40 mg total) by mouth daily. Susy Frizzle, MD Taking Active             Patient Active Problem List   Diagnosis Date Noted   Goals of care, counseling/discussion 09/04/2021   Sepsis ([place]) 40/34/7425   Complicated UTI (urinary tract infection) 10/24/2020   Chronic heart failure with preserved ejection fraction (HFpEF) ([place]) 10/24/2020   CHF, chronic (HCC)    Prostate cancer ([place])    Congestive heart failure ([place]) 07/02/2020   Macrocytic anemia 06/30/2020   Elevated PSA 06/30/2020   Cellulitis of lower extremity 06/30/2020   Prediabetes 01/04/2019   Paroxysmal atrial fibrillation ([place]) 01/01/2019   Risk for falls 12/27/2017   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[place]) 10/18/2017   Right ureteral stone 10/01/2017   Iron deficiency anemia due to chronic blood loss 06/22/2016   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   Depression with anxiety 06/19/2015   CKD (chronic kidney disease) stage 3, GFR 30-59 ml/min (HCC) 05/26/2015   Essential hypertension 11/01/2014   Gouty arthritis of toe 11/01/2014   Morbid obesity due to excess calories ([place]) 11/01/2014   Primary osteoarthritis of both knees 11/01/2014    Immunization  History  Administered Date(s) Administered   Fluad Quad(high Dose 65+) 10/26/2020, 08/27/2021   Influenza Split 10/17/2012, 10/03/2017   PFIZER(Purple Top)SARS-COV-2 Vaccination 10/27/2020   Pneumococcal Polysaccharide-23 10/17/2012, 10/26/2020    Conditions to be addressed/monitored:  CHF, HTN w/CKD, Afib, Pre-DM,   There are no care plans that you recently modified to display for this patient.    Medication Assistance: {MEDASSISTANCEINFO:25044}  Compliance/Adherence/Medication fill history: Care Gaps: ***  Star-Rating Drugs: ***  Patient's preferred pharmacy is:  [place], [place] [address] Phone: [phone] Fax: [phone]  [place] ([place]), [place] - [address] [place] [address] Phone: [phone] Fax: [phone]  Uses pill box? {Yes or If no, why not?:20788} Pt endorses ***% compliance  We discussed: {Pharmacy options:24294} Patient decided to: {US Pharmacy Plan:23885}  Care Plan and Follow Up Patient Decision:  {FOLLOWUP:24991}  Plan: {CM FOLLOW UP JSEG:31517}  ***  Current Barriers:  {pharmacybarriers:24917}  Pharmacist Clinical Goal(s):  Patient will {PHARMACYGOALCHOICES:24921} through collaboration with PharmD and provider.   Interventions: 1:1 collaboration with Susy Frizzle, MD regarding development and update of comprehensive plan of care as evidenced by provider attestation and co-signature Inter-disciplinary care team collaboration (see longitudinal plan of care) Comprehensive medication review performed; medication list updated in electronic medical record  Hypertension w/ CKD (BP goal {CHL HP UPSTREAM Pharmacist BP [phone]}) -{US controlled/uncontrolled:25276} -Current treatment: *** -Medications previously tried: ***  -Current home readings: *** -Current dietary habits: *** -Current  exercise habits: *** -{ACTIONS;DENIES/REPORTS:21021675::"Denies"} hypotensive/hypertensive symptoms -Educated on {CCM BP Counseling:25124} -Counseled to monitor BP at home ***, document, and provide log at future appointments -{CCMPHARMDINTERVENTION:25122}  Pre-Diabetes (A1c goal {A1c goals:23924}) -{US controlled/uncontrolled:25276} -Current medications: *** -Medications previously tried: ***  -Current home glucose readings fasting glucose: *** post prandial glucose: *** -{ACTIONS;DENIES/REPORTS:21021675::"Denies"} hypoglycemic/hyperglycemic symptoms -Current meal patterns:  breakfast: ***  lunch: ***  dinner: *** snacks: *** drinks: *** -Current exercise: *** -Educated on {CCM DM COUNSELING:25123} -Counseled to check feet daily and get yearly eye exams -{CCMPHARMDINTERVENTION:25122}  Atrial Fibrillation (Goal: prevent stroke and major bleeding) -{US controlled/uncontrolled:25276} -CHADSVASC: *** -Current treatment: Rate control: *** Anticoagulation: *** -Medications previously tried: *** -Home BP and HR readings: ***  -  Counseled on {CCMAFIBCOUNSELING:25120} -{CCMPHARMDINTERVENTION:25122}  Heart Failure (Goal: manage symptoms and prevent exacerbations) -{US controlled/uncontrolled:25276} -Last ejection fraction: *** (Date: ***) -HF type: {type of heart failure:30421350} -NYHA Class: {CHL HP Upstream Pharm NYHA [phone]} -AHA HF Stage: {CHL HP Upstream Pharm AHA HF Stage:[phone]} -Current treatment: *** -Medications previously tried: ***  -Current home BP/HR readings: *** -Current dietary habits: *** -Current exercise habits: *** -Educated on {CCM HF Counseling:25125} -{CCMPHARMDINTERVENTION:25122}  *** (Goal: ***) -{US controlled/uncontrolled:25276} -Current treatment  *** -Medications previously tried: ***  -{CCMPHARMDINTERVENTION:25122}  Patient Goals/Self-Care Activities Patient will:  - {pharmacypatientgoals:24919}  Follow Up Plan: {CM  FOLLOW UP YPZX:80638}

## 2021-12-03 ENCOUNTER — Encounter: Payer: Self-pay | Admitting: Oncology

## 2021-12-03 ENCOUNTER — Telehealth: Payer: Self-pay | Admitting: Pharmacist

## 2021-12-03 NOTE — Chronic Care Management (AMB) (Signed)
Chronic Care Management Pharmacy Assistant   Name: Terry [name]  MRN: [id number] DOB: 27-Sep-1941   Reason for Encounter: Chart Review For Initial Visit With Clinical Pharmacist.   Conditions to be addressed/monitored: CHF, Atrial Fibrillation, Malignant neoplasm of transverse colon, OA of both knees, CKD, Prostate cancer metastatic to bone, Prediabetes, Vitamin B12 deficiency,   Primary concerns for visit include:    Recent office visits:  08/27/2021 OV (PCP) Susy Frizzle, MD; However the patient is a challenge because I question if he is compliant with his medication.  I believe that he is very well intention but I am concerned that he may get confused and not take some of his medicines by accident.  Therefore I want to verify that he is actually on the medication we believe him to be taking  Recent consult visits:  09/23/2021 OV (Oncology) Sindy Guadeloupe, MD; My plan is to continue Eligard along without initiation of additional antiandrogen drugs for now and decide based on his PSA response.  08/28/2021 OV (Oncology) Sindy Guadeloupe, MD;  I am inclined to continue Lupron every 6 months and assess his PSA trends and see his overall burden of disease before considering any additional treatments for him  08/20/2021 OV (Urology) Billey Co, MD; Regarding his urinary symptoms of urgency, frequency, nocturia, I recommended continuing the Myrbetriq, and considering a fitting for a condom catheter for overnight.  Again, I think he would be extremely high risk for any surgery for his right-sided stone and ureteral stent change with very little benefit  Hospital visits:  None in previous 6 months  Medications: Outpatient Encounter Medications as of 12/03/2021  Medication Sig   allopurinol (ZYLOPRIM) 300 MG tablet Take 1 tablet (300 mg total) by mouth daily. (Patient not taking: Reported on 08/28/2021)   apixaban (ELIQUIS) 5 MG TABS tablet Take 1 tablet (5 mg total) by mouth 2  (two) times daily.   carvedilol (COREG) 25 MG tablet Take 1 tablet (25 mg total) by mouth 2 (two) times daily with a meal.   furosemide (LASIX) 20 MG tablet Take 1 tablet (20 mg total) by mouth daily.   hydrochlorothiazide (HYDRODIURIL) 25 MG tablet Take 1 tablet (25 mg total) by mouth daily.   levothyroxine (SYNTHROID) 25 MCG tablet Take 1 tablet (25 mcg total) by mouth daily before breakfast.   mirabegron ER (MYRBETRIQ) 50 MG TB24 tablet Take 1 tablet (50 mg total) by mouth daily.   Multiple Vitamins-Minerals (MULTIVITAMINS THER. W/MINERALS) TABS Take 1 tablet by mouth daily.   polyethylene glycol (MIRALAX / GLYCOLAX) 17 g packet Take 17 g by mouth daily as needed for mild constipation.   senna-docusate (SENOKOT-S) 8.6-50 MG tablet Take 2 tablets by mouth at bedtime as needed for mild constipation.   spironolactone (ALDACTONE) 25 MG tablet Take 1 tablet (25 mg total) by mouth daily.   tamsulosin (FLOMAX) 0.4 MG CAPS capsule Take 1 capsule (0.4 mg total) by mouth daily.   telmisartan (MICARDIS) 40 MG tablet Take 1 tablet (40 mg total) by mouth daily.   No facility-administered encounter medications on file as of 12/03/2021.   Current Medications: Allopurinol 300 mg last filled 09/25/2021 90 DS Mirabegron 50 mg last filled 09/25/2021 90 DS Senna-Docusate 8.6-50 mg Carvedilol 25 mg last filled 11/07/2021 90 DS Telmisartan 40 mg last filled 11/07/2021 90 DS Furosemide 20 mg last filled 08/23/2021 90 DS Spironolactone 25 mg last filled 08/23/2021 90 DS Eliquis 5 mg last filled 10/17/2021 90 DS Hydrochlorothiazide 25  mg last filled 08/23/2021 90 DS Levothyroxine 25 mcg last filled 11/07/2021 90 DS Tamsulosin 0.4 mg last filled 08/23/2021 90 DS Miralax [place] Vitamins-Minerals  Patient Questions: Any changes in your medications or health?  Any side effects from any medications?   Do you have any symptoms or problems not managed by your medications?  Any concerns about your health  right now?  Has your provider asked that you check blood pressure, blood sugar, or follow special diet at home?  Do you get any type of exercise on a regular basis?  Can you think of a goal you would like to reach for your health?  Do you have any problems getting your medications?  Is there anything that you would like to discuss during the appointment?   Please bring medications and supplements to appointment  **Unsuccessful attempt at reaching patient to complete this call.**   Care Gaps: Medicare Annual Wellness: Due now Hemoglobin A1C: 6.0% on 08/27/2021 Colonoscopy: Aged out  Future Appointments  Date Time Provider [place]  12/10/2021  9:00 AM [facility] PHARMACIST [facility] None  12/11/2021  9:00 AM [facility] CARE MGR [facility] None  12/28/2021  1:45 PM [facility] None  12/28/2021  2:15 PM Sindy Guadeloupe, MD [facility] None  02/18/2022  2:00 PM Billey Co, MD [facility] None   Star Rating Drugs: Telmisartan 40 mg last filled 11/07/2021 90 DS  April D Calhoun, [place] Pharmacist Assistant [phone]

## 2021-12-07 ENCOUNTER — Telehealth: Payer: Self-pay

## 2021-12-07 NOTE — Telephone Encounter (Signed)
118 pm. Follow up call made to patient.  No answer.  Message left on VM requesting a call back.

## 2021-12-10 ENCOUNTER — Ambulatory Visit: Payer: Medicare HMO

## 2021-12-11 ENCOUNTER — Telehealth: Payer: Medicare HMO

## 2021-12-11 ENCOUNTER — Telehealth: Payer: Self-pay | Admitting: *Deleted

## 2021-12-11 NOTE — Telephone Encounter (Signed)
°  Care Management   Follow Up Note   12/11/2021 Name: Terry [name] MRN: [id number] DOB: 08-14-1941   Referred by: Susy Frizzle, MD Reason for referral : Chronic Care Management (CHF, HTN)   An unsuccessful telephone outreach was attempted today. The patient was referred to the case management team for assistance with care management and care coordination.   Follow Up Plan: Telephone follow up appointment with care management team member scheduled for:  upon care guide rescheduling.  [name] RNC, BSN RN Case Manager [facility] [phone]

## 2021-12-15 ENCOUNTER — Telehealth: Payer: Medicare Other

## 2021-12-18 ENCOUNTER — Encounter: Payer: Self-pay | Admitting: Oncology

## 2021-12-18 ENCOUNTER — Telehealth: Payer: Self-pay | Admitting: Family Medicine

## 2021-12-18 NOTE — Telephone Encounter (Signed)
Left message for patient to call back and schedule Medicare Annual Wellness Visit (AWV) in office.  ° °If not able to come in office, please offer to do virtually or by telephone.  Left office number and my jabber #[phone]. ° °Due for AWVI ° °Please schedule at anytime with Nurse Health Advisor. °  °

## 2021-12-21 ENCOUNTER — Other Ambulatory Visit: Payer: Self-pay | Admitting: *Deleted

## 2021-12-21 ENCOUNTER — Ambulatory Visit (INDEPENDENT_AMBULATORY_CARE_PROVIDER_SITE_OTHER): Payer: Medicare Other | Admitting: *Deleted

## 2021-12-21 DIAGNOSIS — I10 Essential (primary) hypertension: Secondary | ICD-10-CM

## 2021-12-21 DIAGNOSIS — C7951 Secondary malignant neoplasm of bone: Secondary | ICD-10-CM

## 2021-12-21 DIAGNOSIS — I5032 Chronic diastolic (congestive) heart failure: Secondary | ICD-10-CM

## 2021-12-21 DIAGNOSIS — C61 Malignant neoplasm of prostate: Secondary | ICD-10-CM

## 2021-12-21 NOTE — Chronic Care Management (AMB) (Signed)
Chronic Care Management   CCM RN Visit Note  12/21/2021 Name: Terry [name] MRN: [id number] DOB: 11/09/1941  Subjective: Terry [name] is a 81 y.o. year old male who is a primary care patient of Pickard, [name], MD. The care management team was consulted for assistance with disease management and care coordination needs.    Engaged with patient by telephone for follow up visit in response to provider referral for case management and/or care coordination services.   Consent to Services:  The patient was given information about Chronic Care Management services, agreed to services, and gave verbal consent prior to initiation of services.  Please see initial visit note for detailed documentation.   Patient agreed to services and verbal consent obtained.   Assessment: Review of patient past medical history, allergies, medications, health status, including review of consultants reports, laboratory and other test data, was performed as part of comprehensive evaluation and provision of chronic care management services.   SDOH (Social Determinants of Health) assessments and interventions performed:    CCM Care Plan  Allergies  Allergen Reactions   Lisinopril     FACIAL SWELLING    Outpatient Encounter Medications as of 12/21/2021  Medication Sig   apixaban (ELIQUIS) 5 MG TABS tablet Take 1 tablet (5 mg total) by mouth 2 (two) times daily.   carvedilol (COREG) 25 MG tablet Take 1 tablet (25 mg total) by mouth 2 (two) times daily with a meal.   furosemide (LASIX) 20 MG tablet Take 1 tablet (20 mg total) by mouth daily.   hydrochlorothiazide (HYDRODIURIL) 25 MG tablet Take 1 tablet (25 mg total) by mouth daily.   levothyroxine (SYNTHROID) 25 MCG tablet Take 1 tablet (25 mcg total) by mouth daily before breakfast.   mirabegron ER (MYRBETRIQ) 50 MG TB24 tablet Take 1 tablet (50 mg total) by mouth daily.   Multiple Vitamins-Minerals (MULTIVITAMINS THER. W/MINERALS) TABS Take 1 tablet by mouth  daily.   polyethylene glycol (MIRALAX / GLYCOLAX) 17 g packet Take 17 g by mouth daily as needed for mild constipation.   senna-docusate (SENOKOT-S) 8.6-50 MG tablet Take 2 tablets by mouth at bedtime as needed for mild constipation.   spironolactone (ALDACTONE) 25 MG tablet Take 1 tablet (25 mg total) by mouth daily.   tamsulosin (FLOMAX) 0.4 MG CAPS capsule Take 1 capsule (0.4 mg total) by mouth daily.   telmisartan (MICARDIS) 40 MG tablet Take 1 tablet (40 mg total) by mouth daily.   allopurinol (ZYLOPRIM) 300 MG tablet Take 1 tablet (300 mg total) by mouth daily. (Patient not taking: Reported on 08/28/2021)   No facility-administered encounter medications on file as of 12/21/2021.    Patient Active Problem List   Diagnosis Date Noted   Goals of care, counseling/discussion 09/04/2021   Sepsis ([place]) 76/19/5093   Complicated UTI (urinary tract infection) 10/24/2020   Chronic heart failure with preserved ejection fraction (HFpEF) ([place]) 10/24/2020   CHF, chronic (HCC)    Prostate cancer ([place])    Congestive heart failure ([place]) 07/02/2020   Macrocytic anemia 06/30/2020   Elevated PSA 06/30/2020   Cellulitis of lower extremity 06/30/2020   Prediabetes 01/04/2019   Paroxysmal atrial fibrillation ([place]) 01/01/2019   Risk for falls 12/27/2017   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[place]) 10/18/2017   Right ureteral stone 10/01/2017   Iron deficiency anemia due to chronic blood loss 06/22/2016   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   Depression with anxiety 06/19/2015   CKD (chronic kidney disease) stage  3, GFR 30-59 ml/min (HCC) 05/26/2015   Essential hypertension 11/01/2014   Gouty arthritis of toe 11/01/2014   Morbid obesity due to excess calories ([place]) 11/01/2014   Primary osteoarthritis of both knees 11/01/2014    Conditions to be addressed/monitored:CHF and HTN  Care Plan : RN Care Manager plan of care  Updates made by Kassie Mends, RN since  12/21/2021 12:00 AM     Problem: No plan of care established for management of chronic disease states (CHF, HTN, cancer)   Priority: High     Long-Range Goal: Development of plan of care for chronic disease management (CHF, HTN, cancer)   Start Date: 09/28/2021  Expected End Date: 03/27/2022  Priority: High  Note:    Current Barriers:  Knowledge Deficits related to plan of care for management of CHF and HTN  Chronic Disease Management support and education needs related to CHF and HTN Patient reports his adult son lives with him and provides assistance if needed, has adult daughter Terry [name]), patient reports he still drives, is overall independent with aspects of his care, requires assistance getting up steps, uses shower seat for bathing.  Patient reports he has prostate cancer and with last scan/ tests, etc reports " things are about the same, stabilized for now", sees oncologist Dr. Randa Evens, had CT scan 09/30/21. Patient reports he has a scale but does not weigh often, last weight one week ago 303 pounds, has blood pressure cuff and checks blood pressure " on occasion"  Patient reports he does not adhere to a special diet but tries " to eat as healthy as I can"  Patient reports today he has burning with urination and plans to call primary care provider and is able to do so  RNCM Clinical Goal(s):  Patient will verbalize understanding of plan for management of CHF and HTN as evidenced by patient report, EHR review take all medications exactly as prescribed and will call provider for medication related questions as evidenced by patient report, EHR review, collaboration with CCM pharmacist    attend all scheduled medical appointments:   as evidenced by patient report, EHR review and        through collaboration with RN Care manager, provider, and care team.   Interventions: 1:1 collaboration with primary care provider regarding development and update of comprehensive plan of care as  evidenced by provider attestation and co-signature Inter-disciplinary care team collaboration (see longitudinal plan of care) Evaluation of current treatment plan related to  self management and patient's adherence to plan as established by provider  Hypertension Interventions: Last practice recorded BP readings:  BP Readings from Last 3 Encounters:  09/23/21 (!) 142/71  08/28/21 (!) 145/71  08/27/21 (!) 160/68  Most recent eGFR/CrCl:  Lab Results  Component Value Date   EGFR 43 (L) 08/27/2021    No components found for: CRCL  Evaluation of current treatment plan related to hypertension self management and patient's adherence to plan as established by provider Reviewed medications with patient and discussed importance of compliance Reviewed low sodium diet, importance of reading food labels Reviewed upcoming scheduled appointments Reviewed good handwashing and wearing a mask as needed Pain assessment completed Instructed patient to call primary care provider today and make appointment for burning with urination, gave patient primary care provider phone number  Heart Failure Interventions: Discussed importance of daily weight and advised patient to weigh and record daily Discussed the importance of keeping all appointments with provider Reinforced heart failure action plan, importance of  calling doctor early for change in healthy status, symptoms   Patient Goals/Self-Care Activities: Take medications as prescribed   Attend all scheduled provider appointments Perform all self care activities independently  Perform IADL's (shopping, preparing meals, housekeeping, managing finances) independently Call provider office for new concerns or questions  call office if I gain more than 2 pounds in one day or 5 pounds in one week do ankle pumps when sitting keep legs up while sitting track weight in diary watch for swelling in feet, ankles and legs every day weigh myself daily follow  rescue plan if symptoms flare-up check blood pressure weekly write blood pressure results in a log or diary take blood pressure log to all doctor appointments take medications for blood pressure exactly as prescribed report new symptoms to your doctor Practice good handwashing, wear a mask as needed Call primary care doctor today and make appointment for new issue- burning with urination Call RN care manager if you have any questions at [phone]        Plan:Telephone follow up appointment with care management team member scheduled for:  12/25/21 and 02/01/22  [name] [name], BSN RN Case Manager [place] [facility] [phone]

## 2021-12-21 NOTE — Patient Instructions (Addendum)
Visit Information  Thank you for taking time to visit with me today. Please don't hesitate to contact me if I can be of assistance to you before our next scheduled telephone appointment.  Following are the goals we discussed today:  Take medications as prescribed   Attend all scheduled provider appointments Perform all self care activities independently  Perform IADL's (shopping, preparing meals, housekeeping, managing finances) independently Call provider office for new concerns or questions  call office if I gain more than 2 pounds in one day or 5 pounds in one week do ankle pumps when sitting keep legs up while sitting track weight in diary watch for swelling in feet, ankles and legs every day weigh myself daily follow rescue plan if symptoms flare-up check blood pressure weekly write blood pressure results in a log or diary take blood pressure log to all doctor appointments take medications for blood pressure exactly as prescribed report new symptoms to your doctor Practice good handwashing, wear a mask as needed Call primary care doctor today and make appointment for new issue- burning with urination Call RN care manager if you have any questions at [phone]  Our next appointment is telephone outreach on 12/25/21 and 02/01/22 at 1045 am  Please call the care guide team at [phone] if you need to cancel or reschedule your appointment.   If you are experiencing a Mental Health or [place] or need someone to talk to, please call the Suicide and Crisis Lifeline: 988 call the [place] National Suicide Prevention Lifeline: 804-599-1811 or TTY: 562-848-9177 TTY 905-313-9843) to talk to a trained counselor call 1-800-273-TALK (toll free, 24 hour hotline) go to [facility] [address] [place] [phone]) call 911   Patient verbalizes understanding of instructions and care plan provided today and agrees to view in  [place]. Active MyChart status confirmed with patient.    [name] RNC, BSN RN Case Manager [facility] [phone]

## 2021-12-25 ENCOUNTER — Telehealth: Payer: Medicare Other

## 2021-12-25 ENCOUNTER — Telehealth: Payer: Self-pay | Admitting: *Deleted

## 2021-12-25 NOTE — Telephone Encounter (Signed)
°  Care Management   Follow Up Note   12/25/2021 Name: Terry [name] MRN: [id number] DOB: February 03, 1941   Referred by: Susy Frizzle, MD Reason for referral : Chronic Care Management (CHF, HTN)   An unsuccessful telephone outreach was attempted today. The patient was referred to the case management team for assistance with care management and care coordination.   Follow Up Plan: Telephone follow up appointment with care management team member scheduled for:  02/01/22  [name] [facility], BSN RN Case Manager [place] [facility] [phone]

## 2021-12-28 ENCOUNTER — Inpatient Hospital Stay: Payer: Medicare Other | Admitting: Oncology

## 2021-12-28 ENCOUNTER — Inpatient Hospital Stay: Payer: Medicare Other | Attending: Oncology

## 2021-12-29 ENCOUNTER — Encounter: Payer: Self-pay | Admitting: Oncology

## 2022-01-07 ENCOUNTER — Telehealth: Payer: Self-pay | Admitting: Family Medicine

## 2022-01-07 NOTE — Telephone Encounter (Signed)
Left message for patient to call back and schedule Medicare Annual Wellness Visit (AWV) in office.  ° °If not able to come in office, please offer to do virtually or by telephone.  Left office number and my jabber #[phone]. ° °Due for AWVI ° °Please schedule at anytime with Nurse Health Advisor. °  °

## 2022-01-12 DIAGNOSIS — I110 Hypertensive heart disease with heart failure: Secondary | ICD-10-CM

## 2022-01-22 ENCOUNTER — Ambulatory Visit (INDEPENDENT_AMBULATORY_CARE_PROVIDER_SITE_OTHER): Payer: Medicare Other

## 2022-01-22 ENCOUNTER — Other Ambulatory Visit: Payer: Self-pay

## 2022-01-22 VITALS — BP 138/80 | HR 61 | Ht 67.0 in | Wt 314.0 lb

## 2022-01-22 DIAGNOSIS — Z0000 Encounter for general adult medical examination without abnormal findings: Secondary | ICD-10-CM | POA: Diagnosis not present

## 2022-01-22 DIAGNOSIS — R3121 Asymptomatic microscopic hematuria: Secondary | ICD-10-CM | POA: Insufficient documentation

## 2022-01-22 DIAGNOSIS — R351 Nocturia: Secondary | ICD-10-CM | POA: Insufficient documentation

## 2022-01-22 DIAGNOSIS — Z960 Presence of urogenital implants: Secondary | ICD-10-CM | POA: Insufficient documentation

## 2022-01-22 DIAGNOSIS — N401 Enlarged prostate with lower urinary tract symptoms: Secondary | ICD-10-CM | POA: Insufficient documentation

## 2022-01-22 NOTE — Progress Notes (Signed)
Subjective:   Terry [name] is a 81 y.o. male who presents for an Initial Medicare Annual Wellness Visit.  Review of Systems     Cardiac Risk Factors include: advanced age (>28mn, >>30women);hypertension;male gender;sedentary lifestyle;obesity (BMI >30kg/m2)     Objective:    Today's Vitals   01/22/22 1001 01/22/22 1009  BP: 138/80   Pulse: 61   SpO2: 100%   Weight: (!) 314 lb (142.4 kg)   Height: '5\' 7"'$  (1.702 m)   PainSc:  5    Body mass index is 49.18 kg/m.  Advanced Directives 01/22/2022 09/28/2021 09/23/2021 08/28/2021 10/27/2020 10/25/2020 10/25/2020  Does Patient Have a Medical Advance Directive? Yes Yes Yes No - - Yes  Type of AIndustrial/product designerof AFreescale SemiconductorPower of Attorney - - - -  Does patient want to make changes to medical advance directive? - No - Patient declined No - Patient declined No - Patient declined Yes (ED - Information included in AVS) Yes (ED - Information included in AVS) Yes (ED - Information included in AVS)  Copy of HAnamosain Chart? No - copy requested No - copy requested No - copy requested - - - -    Current Medications (verified) Outpatient Encounter Medications as of 01/22/2022  Medication Sig   allopurinol (ZYLOPRIM) 300 MG tablet Take 1 tablet (300 mg total) by mouth daily.   apixaban (ELIQUIS) 5 MG TABS tablet Take 1 tablet (5 mg total) by mouth 2 (two) times daily.   carvedilol (COREG) 25 MG tablet Take 1 tablet (25 mg total) by mouth 2 (two) times daily with a meal.   furosemide (LASIX) 20 MG tablet Take 1 tablet (20 mg total) by mouth daily.   hydrochlorothiazide (HYDRODIURIL) 25 MG tablet Take 1 tablet (25 mg total) by mouth daily.   levothyroxine (SYNTHROID) 25 MCG tablet Take 1 tablet (25 mcg total) by mouth daily before breakfast.   mirabegron ER (MYRBETRIQ) 50 MG TB24 tablet Take 1 tablet (50 mg total) by mouth daily.   Multiple Vitamins-Minerals  (MULTIVITAMINS THER. W/MINERALS) TABS Take 1 tablet by mouth daily.   polyethylene glycol (MIRALAX / GLYCOLAX) 17 g packet Take 17 g by mouth daily as needed for mild constipation.   senna-docusate (SENOKOT-S) 8.6-50 MG tablet Take 2 tablets by mouth at bedtime as needed for mild constipation.   spironolactone (ALDACTONE) 25 MG tablet Take 1 tablet (25 mg total) by mouth daily.   tamsulosin (FLOMAX) 0.4 MG CAPS capsule Take 1 capsule (0.4 mg total) by mouth daily.   telmisartan (MICARDIS) 40 MG tablet Take 1 tablet (40 mg total) by mouth daily.   No facility-administered encounter medications on file as of 01/22/2022.    Allergies (verified) Lisinopril   History: Past Medical History:  Diagnosis Date   Arthritis    [place]    Phreesia 08/04/2020   CHF, chronic (HCC)    diastolic   CKD (chronic kidney disease) stage 3, GFR 30-59 ml/min (HCC)    Diabetes mellitus without complication (HCC)    Dysrhythmia    Hypertension    Hypothyroidism    Lymphedema    Paroxysmal atrial fibrillation (HCC)    Prostate cancer ([facility]    Renal insufficiency    Past Surgical History:  Procedure Laterality Date   COLON SURGERY N/A    Phreesia 08/04/2020   Family History  Problem Relation Age of Onset   Hypertension Mother    Diabetes Son  Social History   Socioeconomic History   Marital status: Widowed    Spouse name: Not on file   Number of children: 2   Years of education: Not on file   Highest education level: Not on file  Occupational History   Not on file  Tobacco Use   Smoking status: Never   Smokeless tobacco: Never  Vaping Use   Vaping Use: Never used  Substance and Sexual Activity   Alcohol use: Yes    Comment: rarely   Drug use: Yes    Types: Marijuana   Sexual activity: Not Currently  Other Topics Concern   Not on file  Social History Narrative   Lives with son. Daughter lives in [place].   Social Determinants of Health   Financial Resource Strain: Low Risk     Difficulty of Paying Living Expenses: Not hard at all  Food Insecurity: No Food Insecurity   Worried About Charity fundraiser in the Last Year: Never true   [place] in the Last Year: Never true  Transportation Needs: No Transportation Needs   Lack of Transportation (Medical): No   Lack of Transportation (Non-Medical): No  Physical Activity: Inactive   Days of Exercise per Week: 0 days   Minutes of Exercise per Session: 0 min  Stress: No Stress Concern Present   Feeling of Stress : Not at all  Social Connections: Moderately Isolated   Frequency of Communication with Friends and Family: More than three times a week   Frequency of Social Gatherings with Friends and Family: More than three times a week   Attends Religious Services: 1 to 4 times per year   Active Member of [employer] or Organizations: No   Attends Archivist Meetings: Never   Marital Status: Widowed    Tobacco Counseling Counseling given: Not Answered   Clinical Intake:  Pre-visit preparation completed: Yes  Pain : 0-10 Pain Score: 5  Pain Type: Chronic pain Pain Location: Knee Pain Descriptors / Indicators: Aching, Dull Pain Onset: More than a month ago Pain Frequency: Intermittent     BMI - recorded: 49.18 Nutritional Status: BMI > 30  Obese Nutritional Risks: None Diabetes: No  How often do you need to have someone help you when you read instructions, pamphlets, or other written materials from your doctor or pharmacy?: 1 - Never  Diabetic?NO  Interpreter Needed?: No  Information entered by :: MJ Varick Keys, LPN   Activities of Daily Living In your present state of health, do you have any difficulty performing the following activities: 01/22/2022  Hearing? Y  Vision? N  Difficulty concentrating or making decisions? Y  Comment memory at times per pt.  Walking or climbing stairs? Y  Dressing or bathing? N  Doing errands, shopping? N  Preparing Food and eating ? N  Using the  Toilet? N  In the past six months, have you accidently leaked urine? Y  Comment Incontinence at times.  Do you have problems with loss of bowel control? N  Managing your Medications? N  Managing your Finances? N  Housekeeping or managing your Housekeeping? N  Some recent data might be hidden    Patient Care Team: Susy Frizzle, MD as PCP - General (Family Medicine) Rockey Situ Kathlene November, MD as Consulting Physician (Cardiology) Kassie Mends, RN as Case Manager Deirdre Peer, LCSW as Social Worker Rosana Hoes, Jerilynn Mages, [facility] as Pharmacist (Pharmacist)  Indicate any recent Medical Services you may have received from other than  [facility] providers in the past year (date may be approximate).     Assessment:   This is a routine wellness examination for Simpson.  Hearing/Vision screen Hearing Screening - Comments:: Some hearing issues. Vision Screening - Comments:: Readers. 2022.  Dietary issues and exercise activities discussed: Current Exercise Habits: The patient does not participate in regular exercise at present, Exercise limited by: cardiac condition(s);respiratory conditions(s)   Goals Addressed             This Visit's Progress    Weight (lb) < 200 lb (90.7 kg)   314 lb (142.4 kg)      Depression Screen PHQ 2/9 Scores 01/22/2022 09/28/2021 09/28/2021 01/08/2021 06/25/2020  PHQ - 2 Score 1 0 0 0 0    Fall Risk Fall Risk  01/22/2022 09/28/2021 01/08/2021 06/25/2020  Falls in the past year? '1 1 1 '$ 0  Number falls in past yr: 1 1 0 0  Injury with Fall? 0 0 0 0  Risk for fall due to : Impaired balance/gait;Impaired mobility;History of fall(s) - - Impaired mobility;Other (Comment)  Risk for fall due to: Comment - - - edeam r/t CHF puts at higher fall risk  Follow up Falls prevention discussed - Falls evaluation completed Falls evaluation completed;Education provided;Follow up appointment;Falls prevention discussed    FALL RISK PREVENTION PERTAINING TO THE HOME:  Any stairs  in or around the home? Yes  If so, are there any without handrails? No  Home free of loose throw rugs in walkways, pet beds, electrical cords, etc? Yes  Adequate lighting in your home to reduce risk of falls? Yes   ASSISTIVE DEVICES UTILIZED TO PREVENT FALLS:  Life alert? No  Use of a cane, walker or w/c? Yes  Grab bars in the bathroom? No  Shower chair or bench in shower? Yes  Elevated toilet seat or a handicapped toilet? Yes   TIMED UP AND GO:  Was the test performed? Yes .  Length of time to ambulate 10 feet: 15 sec.   Gait slow and steady with assistive device  Cognitive Function:     6CIT Screen 01/22/2022  What Year? 0 points  What month? 0 points  What time? 0 points  Count back from 20 0 points  Months in reverse 4 points  Repeat phrase 2 points  Total Score 6    Immunizations Immunization History  Administered Date(s) Administered   Fluad Quad(high Dose 65+) 10/26/2020, 08/27/2021   Influenza Split 10/17/2012, 10/03/2017   PFIZER(Purple Top)SARS-COV-2 Vaccination 10/27/2020   Pneumococcal Polysaccharide-23 10/17/2012, 10/26/2020    TDAP status: Due, Education has been provided regarding the importance of this vaccine. Advised may receive this vaccine at local pharmacy or Health Dept. Aware to provide a copy of the vaccination record if obtained from local pharmacy or Health Dept. Verbalized acceptance and understanding.  Flu Vaccine status: Up to date  Pneumococcal vaccine status: Due, Education has been provided regarding the importance of this vaccine. Advised may receive this vaccine at local pharmacy or Health Dept. Aware to provide a copy of the vaccination record if obtained from local pharmacy or Health Dept. Verbalized acceptance and understanding.  Covid-19 vaccine status: Completed vaccines  Qualifies for Shingles Vaccine? Yes   Zostavax completed No   Shingrix Completed?: No.    Education has been provided regarding the importance of this  vaccine. Patient has been advised to call insurance company to determine out of pocket expense if they have not yet received this vaccine. Advised may also receive  vaccine at local pharmacy or Health Dept. Verbalized acceptance and understanding.  Screening Tests Health Maintenance  Topic Date Due   TETANUS/TDAP  Never done   Zoster Vaccines- Shingrix (1 of 2) Never done   COVID-19 Vaccine (2 - Pfizer risk series) 11/17/2020   Pneumonia Vaccine 38+ Years old (2 - PCV) 10/26/2021   INFLUENZA VACCINE  Completed   HPV VACCINES  Aged Out    Health Maintenance  Health Maintenance Due  Topic Date Due   TETANUS/TDAP  Never done   Zoster Vaccines- Shingrix (1 of 2) Never done   COVID-19 Vaccine (2 - Pfizer risk series) 11/17/2020   Pneumonia Vaccine 65+ Years old (2 - PCV) 10/26/2021    Colorectal cancer screening: No longer required.   Lung Cancer Screening: (Low Dose CT Chest recommended if Age 59-80 years, 30 pack-year currently smoking OR have quit w/in 15years.) does not qualify.    Additional Screening:  Hepatitis C Screening: does not qualify;  Vision Screening: Recommended annual ophthalmology exams for early detection of glaucoma and other disorders of the eye. Is the patient up to date with their annual eye exam?  Yes  Who is the provider or what is the name of the office in which the patient attends annual eye exams? Pt unable to remember name of provider. If pt is not established with a provider, would they like to be referred to a provider to establish care? No .   Dental Screening: Recommended annual dental exams for proper oral hygiene  Community Resource Referral / Chronic Care Management: CRR required this visit?  No   CCM required this visit?  No      Plan:     I have personally reviewed and noted the following in the patients chart:   Medical and social history Use of alcohol, tobacco or illicit drugs  Current medications and supplements including  opioid prescriptions. Patient is not currently taking opioid prescriptions. Functional ability and status Nutritional status Physical activity Advanced directives List of other physicians Hospitalizations, surgeries, and ER visits in previous 12 months Vitals Screenings to include cognitive, depression, and falls Referrals and appointments  In addition, I have reviewed and discussed with patient certain preventive protocols, quality metrics, and best practice recommendations. A written personalized care plan for preventive services as well as general preventive health recommendations were provided to patient.     Chriss Driver, LPN   01/08/8249   Nurse Notes: Pt is up to date on age appropriate health maintenance. Discussed Shingrix and how to obtain.

## 2022-01-22 NOTE — Patient Instructions (Signed)
[name] , Thank you for taking time to come for your Medicare Wellness Visit. I appreciate your ongoing commitment to your health goals. Please review the following plan we discussed and let me know if I can assist you in the future.   Screening recommendations/referrals: Colonoscopy: No longer required due to age.  Recommended yearly ophthalmology/optometry visit for glaucoma screening and checkup Recommended yearly dental visit for hygiene and checkup  Vaccinations: Influenza vaccine: Done 08/27/2021. Pneumococcal vaccine: Done 10/26/2020 and 10/17/2012 Tdap vaccine: Due Repeat in 10 years  Shingles vaccine: Discussed.   Covid-19: Done 10/27/2020  Advanced directives: Please bring a copy of your health care power of attorney and living will to the office to be added to your chart at your convenience.   Conditions/risks identified: Aim for 30 minutes of exercise or brisk walking, 6-8 glasses of water, and 5 servings of fruits and vegetables each day.   Next appointment: Follow up in one year for your annual wellness visit. 2024.  Preventive Care 7 Years and Older, Male  Preventive care refers to lifestyle choices and visits with your health care provider that can promote health and wellness. What does preventive care include? A yearly physical exam. This is also called an annual well check. Dental exams once or twice a year. Routine eye exams. Ask your health care provider how often you should have your eyes checked. Personal lifestyle choices, including: Daily care of your teeth and gums. Regular physical activity. Eating a healthy diet. Avoiding tobacco and drug use. Limiting alcohol use. Practicing safe sex. Taking low doses of aspirin every day. Taking vitamin and mineral supplements as recommended by your health care provider. What happens during an annual well check? The services and screenings done by your health care provider during your annual well check will  depend on your age, overall health, lifestyle risk factors, and family history of disease. Counseling  Your health care provider may ask you questions about your: Alcohol use. Tobacco use. Drug use. Emotional well-being. Home and relationship well-being. Sexual activity. Eating habits. History of falls. Memory and ability to understand (cognition). Work and work Statistician. Screening  You may have the following tests or measurements: Height, weight, and BMI. Blood pressure. Lipid and cholesterol levels. These may be checked every 5 years, or more frequently if you are over 35 years old. Skin check. Lung cancer screening. You may have this screening every year starting at age 39 if you have a 30-pack-year history of smoking and currently smoke or have quit within the past 15 years. Fecal occult blood test (FOBT) of the stool. You may have this test every year starting at age 39. Flexible sigmoidoscopy or colonoscopy. You may have a sigmoidoscopy every 5 years or a colonoscopy every 10 years starting at age 81. Prostate cancer screening. Recommendations will vary depending on your family history and other risks. Hepatitis C blood test. Hepatitis B blood test. Sexually transmitted disease (STD) testing. Diabetes screening. This is done by checking your blood sugar (glucose) after you have not eaten for a while (fasting). You may have this done every 1-3 years. Abdominal aortic aneurysm (AAA) screening. You may need this if you are a current or former smoker. Osteoporosis. You may be screened starting at age 65 if you are at high risk. Talk with your health care provider about your test results, treatment options, and if necessary, the need for more tests. Vaccines  Your health care provider may recommend certain vaccines, such as: Influenza vaccine. This  is recommended every year. Tetanus, diphtheria, and acellular pertussis (Tdap, Td) vaccine. You may need a Td booster every 10  years. Zoster vaccine. You may need this after age 35. Pneumococcal 13-valent conjugate (PCV13) vaccine. One dose is recommended after age 46. Pneumococcal polysaccharide (PPSV23) vaccine. One dose is recommended after age 20. Talk to your health care provider about which screenings and vaccines you need and how often you need them. This information is not intended to replace advice given to you by your health care provider. Make sure you discuss any questions you have with your health care provider. Document Released: 11/28/2015 Document Revised: 07/21/2016 Document Reviewed: 09/02/2015 Elsevier Interactive Patient Education  2017 [place] Prevention in the Home Falls can cause injuries. They can happen to people of all ages. There are many things you can do to make your home safe and to help prevent falls. What can I do on the outside of my home? Regularly fix the edges of walkways and driveways and fix any cracks. Remove anything that might make you trip as you walk through a door, such as a raised step or threshold. Trim any bushes or trees on the path to your home. Use bright outdoor lighting. Clear any walking paths of anything that might make someone trip, such as rocks or tools. Regularly check to see if handrails are loose or broken. Make sure that both sides of any steps have handrails. Any raised decks and porches should have guardrails on the edges. Have any leaves, snow, or ice cleared regularly. Use sand or salt on walking paths during winter. Clean up any spills in your garage right away. This includes oil or grease spills. What can I do in the bathroom? Use night lights. Install grab bars by the toilet and in the tub and shower. Do not use towel bars as grab bars. Use non-skid mats or decals in the tub or shower. If you need to sit down in the shower, use a plastic, non-slip stool. Keep the floor dry. Clean up any water that spills on the floor as soon as it  happens. Remove soap buildup in the tub or shower regularly. Attach bath mats securely with double-sided non-slip rug tape. Do not have throw rugs and other things on the floor that can make you trip. What can I do in the bedroom? Use night lights. Make sure that you have a light by your bed that is easy to reach. Do not use any sheets or blankets that are too big for your bed. They should not hang down onto the floor. Have a firm chair that has side arms. You can use this for support while you get dressed. Do not have throw rugs and other things on the floor that can make you trip. What can I do in the kitchen? Clean up any spills right away. Avoid walking on wet floors. Keep items that you use a lot in easy-to-reach places. If you need to reach something above you, use a strong step stool that has a grab bar. Keep electrical cords out of the way. Do not use floor polish or wax that makes floors slippery. If you must use wax, use non-skid floor wax. Do not have throw rugs and other things on the floor that can make you trip. What can I do with my stairs? Do not leave any items on the stairs. Make sure that there are handrails on both sides of the stairs and use them. Fix handrails that  are broken or loose. Make sure that handrails are as long as the stairways. Check any carpeting to make sure that it is firmly attached to the stairs. Fix any carpet that is loose or worn. Avoid having throw rugs at the top or bottom of the stairs. If you do have throw rugs, attach them to the floor with carpet tape. Make sure that you have a light switch at the top of the stairs and the bottom of the stairs. If you do not have them, ask someone to add them for you. What else can I do to help prevent falls? Wear shoes that: Do not have high heels. Have rubber bottoms. Are comfortable and fit you well. Are closed at the toe. Do not wear sandals. If you use a stepladder: Make sure that it is fully opened.  Do not climb a closed stepladder. Make sure that both sides of the stepladder are locked into place. Ask someone to hold it for you, if possible. Clearly mark and make sure that you can see: Any grab bars or handrails. First and last steps. Where the edge of each step is. Use tools that help you move around (mobility aids) if they are needed. These include: Canes. Walkers. Scooters. Crutches. Turn on the lights when you go into a dark area. Replace any light bulbs as soon as they burn out. Set up your furniture so you have a clear path. Avoid moving your furniture around. If any of your floors are uneven, fix them. If there are any pets around you, be aware of where they are. Review your medicines with your doctor. Some medicines can make you feel dizzy. This can increase your chance of falling. Ask your doctor what other things that you can do to help prevent falls. This information is not intended to replace advice given to you by your health care provider. Make sure you discuss any questions you have with your health care provider. Document Released: 08/28/2009 Document Revised: 04/08/2016 Document Reviewed: 12/06/2014 Elsevier Interactive Patient Education  2017 [employer].

## 2022-01-26 ENCOUNTER — Encounter: Payer: Self-pay | Admitting: Family Medicine

## 2022-01-26 ENCOUNTER — Other Ambulatory Visit: Payer: Self-pay

## 2022-01-26 ENCOUNTER — Ambulatory Visit: Payer: Medicare Other | Admitting: Family Medicine

## 2022-01-26 ENCOUNTER — Ambulatory Visit (INDEPENDENT_AMBULATORY_CARE_PROVIDER_SITE_OTHER): Payer: Medicare Other | Admitting: Family Medicine

## 2022-01-26 VITALS — BP 162/98 | HR 65 | Temp 97.3°F | Resp 18 | Ht 67.0 in | Wt 314.0 lb

## 2022-01-26 DIAGNOSIS — N1832 Chronic kidney disease, stage 3b: Secondary | ICD-10-CM | POA: Diagnosis not present

## 2022-01-26 DIAGNOSIS — C61 Malignant neoplasm of prostate: Secondary | ICD-10-CM

## 2022-01-26 DIAGNOSIS — C7951 Secondary malignant neoplasm of bone: Secondary | ICD-10-CM

## 2022-01-26 DIAGNOSIS — Z960 Presence of urogenital implants: Secondary | ICD-10-CM

## 2022-01-26 DIAGNOSIS — R300 Dysuria: Secondary | ICD-10-CM

## 2022-01-26 LAB — URINALYSIS, ROUTINE W REFLEX MICROSCOPIC
Bilirubin Urine: NEGATIVE
Casts: NONE SEEN /LPF
Crystals: NONE SEEN /HPF
Glucose, UA: NEGATIVE
Hyaline Cast: NONE SEEN /LPF
Ketones, ur: NEGATIVE
Nitrite: NEGATIVE
RBC / HPF: >=60 /HPF — AB (ref 0–2)
Specific Gravity, Urine: 1.025 (ref 1.001–1.035)
Yeast: NONE SEEN /HPF
pH: 5.5 (ref 5.0–8.0)

## 2022-01-26 LAB — MICROSCOPIC MESSAGE

## 2022-01-26 MED ORDER — CEPHALEXIN 500 MG PO CAPS: 500.0000 mg | ORAL_CAPSULE | Freq: Three times a day (TID) | ORAL | 0 refills | Status: DC

## 2022-01-26 NOTE — Progress Notes (Signed)
? ?Subjective:  ? ? Patient ID: Terry [name], male    DOB: 1941/01/16, 81 y.o.   MRN: [id number] ? ?HPI  ?01/26/22 ?Patient states that all last week he was experiencing dysuria.  It was burning every time he would urinate.  He has a history of a retained right ureteral stent right-sided hydronephrosis and chronic kidney disease.  He also has a history of metastatic prostate cancer.  He started drinking more water and the dysuria has improved but he continues to have increased frequency and urgency.  However over the weekend he became progressively weak.  He lost his balance while walking and fell suffering an abrasion to his left forehead.  He also "tore off" his left great toenail.  The nail bed is covered with dry black scab blood.  The toe itself is not erythematous or warm or infected.  However it is sore when I touch the nail bed. ?Past Medical History:  ?Diagnosis Date  ? Arthritis   ? Cancer [facility])   ? Phreesia 08/04/2020  ? CHF, chronic ([place])   ? diastolic  ? CKD (chronic kidney disease) stage 3, GFR 30-59 ml/min (HCC)   ? Diabetes mellitus without complication ([place])   ? Dysrhythmia   ? Hypertension   ? Hypothyroidism   ? Lymphedema   ? Paroxysmal atrial fibrillation (HCC)   ? Prostate cancer ([place])   ? Renal insufficiency   ? ?Past Surgical History:  ?Procedure Laterality Date  ? COLON SURGERY N/A   ? Phreesia 08/04/2020  ? ?Current Outpatient Medications on File Prior to Visit  ?Medication Sig Dispense Refill  ? allopurinol (ZYLOPRIM) 300 MG tablet Take 1 tablet (300 mg total) by mouth daily. 90 tablet 3  ? apixaban (ELIQUIS) 5 MG TABS tablet Take 1 tablet (5 mg total) by mouth 2 (two) times daily. 180 tablet 1  ? carvedilol (COREG) 25 MG tablet Take 1 tablet (25 mg total) by mouth 2 (two) times daily with a meal. 180 tablet 1  ? furosemide (LASIX) 20 MG tablet Take 1 tablet (20 mg total) by mouth daily. 90 tablet 1  ? hydrochlorothiazide (HYDRODIURIL) 25 MG tablet Take 1 tablet (25 mg total) by mouth daily.  90 tablet 1  ? levothyroxine (SYNTHROID) 25 MCG tablet Take 1 tablet (25 mcg total) by mouth daily before breakfast. 90 tablet 1  ? mirabegron ER (MYRBETRIQ) 50 MG TB24 tablet Take 1 tablet (50 mg total) by mouth daily. 90 tablet 1  ? Multiple Vitamins-Minerals (MULTIVITAMINS THER. W/MINERALS) TABS Take 1 tablet by mouth daily.    ? polyethylene glycol (MIRALAX / GLYCOLAX) 17 g packet Take 17 g by mouth daily as needed for mild constipation. 14 each 0  ? senna-docusate (SENOKOT-S) 8.6-50 MG tablet Take 2 tablets by mouth at bedtime as needed for mild constipation. 180 tablet 1  ? spironolactone (ALDACTONE) 25 MG tablet Take 1 tablet (25 mg total) by mouth daily. 90 tablet 1  ? tamsulosin (FLOMAX) 0.4 MG CAPS capsule Take 1 capsule (0.4 mg total) by mouth daily. 30 capsule 3  ? telmisartan (MICARDIS) 40 MG tablet Take 1 tablet (40 mg total) by mouth daily. 90 tablet 1  ? ?No current facility-administered medications on file prior to visit.  ? ?Allergies  ?Allergen Reactions  ? Lisinopril   ?  FACIAL SWELLING  ? ?Social History  ? ?Socioeconomic History  ? Marital status: Widowed  ?  Spouse name: Not on file  ? Number of children: 2  ? Years of  education: Not on file  ? Highest education level: Not on file  ?Occupational History  ? Not on file  ?Tobacco Use  ? Smoking status: Never  ? Smokeless tobacco: Never  ?Vaping Use  ? Vaping Use: Never used  ?Substance and Sexual Activity  ? Alcohol use: Yes  ?  Comment: rarely  ? Drug use: Yes  ?  Types: Marijuana  ? Sexual activity: Not Currently  ?Other Topics Concern  ? Not on file  ?Social History Narrative  ? Lives with son. Daughter lives in [place].  ? ?Social Determinants of Health  ? ?Financial Resource Strain: Low Risk   ? Difficulty of Paying Living Expenses: Not hard at all  ?Food Insecurity: No Food Insecurity  ? Worried About Charity fundraiser in the Last Year: Never true  ? Ran Out of Food in the Last Year: Never true  ?Transportation Needs: No Transportation Needs   ? Lack of Transportation (Medical): No  ? Lack of Transportation (Non-Medical): No  ?Physical Activity: Inactive  ? Days of Exercise per Week: 0 days  ? Minutes of Exercise per Session: 0 min  ?Stress: No Stress Concern Present  ? Feeling of Stress : Not at all  ?Social Connections: Moderately Isolated  ? Frequency of Communication with Friends and Family: More than three times a week  ? Frequency of Social Gatherings with Friends and Family: More than three times a week  ? Attends Religious Services: 1 to 4 times per year  ? Active Member of Clubs or Organizations: No  ? Attends Archivist Meetings: Never  ? Marital Status: Widowed  ?Intimate Partner Violence: Not At Risk  ? Fear of Current or Ex-Partner: No  ? Emotionally Abused: No  ? Physically Abused: No  ? Sexually Abused: No  ? ? ?Past Medical History:  ?Diagnosis Date  ? Arthritis   ? Cancer [facility])   ? Phreesia 08/04/2020  ? CHF, chronic ([place])   ? diastolic  ? CKD (chronic kidney disease) stage 3, GFR 30-59 ml/min (HCC)   ? Diabetes mellitus without complication ([place])   ? Dysrhythmia   ? Hypertension   ? Hypothyroidism   ? Lymphedema   ? Paroxysmal atrial fibrillation (HCC)   ? Prostate cancer ([place])   ? Renal insufficiency   ? ? ? ? ?Review of Systems ? ?   ?Objective:  ? Physical Exam ?Vitals reviewed.  ?Constitutional:   ?   General: He is not in acute distress. ?   Appearance: He is obese. He is not ill-appearing, toxic-appearing or diaphoretic.  ?HENT:  ?   Head:  ? ?Cardiovascular:  ?   Rate and Rhythm: Normal rate. Rhythm irregular.  ?   Heart sounds: Normal heart sounds.  ?Pulmonary:  ?   Effort: Pulmonary effort is normal. No respiratory distress.  ?   Breath sounds: No wheezing or rales.  ?Abdominal:  ?   General: Abdomen is flat. Bowel sounds are normal.  ?   Palpations: Abdomen is soft.  ?Musculoskeletal:  ?   Right lower leg: Edema present.  ?   Left lower leg: Edema present.  ?     Feet: ? ?Neurological:  ?   Mental Status: He is  alert.  ? ? ?   ?Assessment & Plan:  ? ?  ? Dysuria - Plan: Urinalysis, Routine w reflex microscopic, Urinalysis, Routine w reflex microscopic, Urine Culture ? ?Prostate cancer metastatic to bone [facility]) ? ?Retained ureteral stent ? ?Stage 3b  chronic kidney disease ([place]) - Plan: Urine Culture, COMPLETE METABOLIC PANEL WITH GFR, CBC with Differential/Platelet ?Check CBC and BMP to monitor renal function as well as white blood cell count and hemoglobin given the fact the patient is weak and recently lost his balance and fell.  He denies any symptoms of concussion intercranial hemorrhage.  Specifically he denies any headache or neurologic deficits.  I cleaned the nailbed with alcohol and peroxide and applied Neosporin, petroleum gauze and wrapped in Coban.  I will treat the urinary tract infection with Keflex 500 mg p.o. 3 times daily for 7 days.  Recheck next week.  Urinalysis shows +3 blood, +2 protein, +3 leukocyte esterase.  Given his complicated history I will send a urine culture. ?

## 2022-01-27 LAB — CBC WITH DIFFERENTIAL/PLATELET
Absolute Monocytes: 676 cells/uL (ref 200–950)
Basophils Absolute: 28 cells/uL (ref 0–200)
Basophils Relative: 0.4 %
Eosinophils Absolute: 152 cells/uL (ref 15–500)
Eosinophils Relative: 2.2 %
HCT: 36.7 % — ABNORMAL LOW (ref 38.5–50.0)
Hemoglobin: 11.4 g/dL — ABNORMAL LOW (ref 13.2–17.1)
Lymphs Abs: 1063 cells/uL (ref 850–3900)
MCH: 25.1 pg — ABNORMAL LOW (ref 27.0–33.0)
MCHC: 31.1 g/dL — ABNORMAL LOW (ref 32.0–36.0)
MCV: 80.7 fL (ref 80.0–100.0)
MPV: 11.3 fL (ref 7.5–12.5)
Monocytes Relative: 9.8 %
Neutro Abs: 4982 cells/uL (ref 1500–7800)
Neutrophils Relative %: 72.2 %
Platelets: 210 10*3/uL (ref 140–400)
RBC: 4.55 10*6/uL (ref 4.20–5.80)
RDW: 14.5 % (ref 11.0–15.0)
Total Lymphocyte: 15.4 %
WBC: 6.9 10*3/uL (ref 3.8–10.8)

## 2022-01-27 LAB — URINE CULTURE
MICRO NUMBER:: 13128298
SPECIMEN QUALITY:: ADEQUATE

## 2022-01-27 LAB — COMPLETE METABOLIC PANEL WITH GFR
AG Ratio: 0.9 (calc) — ABNORMAL LOW (ref 1.0–2.5)
ALT: 7 U/L — ABNORMAL LOW (ref 9–46)
AST: 12 U/L (ref 10–35)
Albumin: 3.7 g/dL (ref 3.6–5.1)
Alkaline phosphatase (APISO): 70 U/L (ref 35–144)
BUN/Creatinine Ratio: 14 (calc) (ref 6–22)
BUN: 26 mg/dL — ABNORMAL HIGH (ref 7–25)
CO2: 23 mmol/L (ref 20–32)
Calcium: 9.1 mg/dL (ref 8.6–10.3)
Chloride: 106 mmol/L (ref 98–110)
Creat: 1.86 mg/dL — ABNORMAL HIGH (ref 0.70–1.22)
Globulin: 4.2 g/dL (calc) — ABNORMAL HIGH (ref 1.9–3.7)
Glucose, Bld: 106 mg/dL — ABNORMAL HIGH (ref 65–99)
Potassium: 4.5 mmol/L (ref 3.5–5.3)
Sodium: 140 mmol/L (ref 135–146)
Total Bilirubin: 0.3 mg/dL (ref 0.2–1.2)
Total Protein: 7.9 g/dL (ref 6.1–8.1)
eGFR: 36 mL/min/{1.73_m2} — ABNORMAL LOW (ref >=60)

## 2022-02-01 ENCOUNTER — Telehealth: Payer: Medicare Other

## 2022-02-01 ENCOUNTER — Telehealth: Payer: Self-pay | Admitting: *Deleted

## 2022-02-01 NOTE — Telephone Encounter (Signed)
?  Care Management  ? ?Follow Up Note ? ? ?02/01/2022 ?Name: Terry [name] MRN: [id number] DOB: 1941/04/25 ? ? ?Referred by: Susy Frizzle, MD ?Reason for referral : Chronic Care Management (CHF, HTN) ? ? ?An unsuccessful telephone outreach was attempted today. The patient was referred to the case management team for assistance with care management and care coordination.  ? ?Follow Up Plan: Telephone follow up appointment with care management team member scheduled for:  upon care guide rescheduling. ? ?[name] RNC, BSN ?RN Case Manager ?[place] ?[phone] ? ? ?

## 2022-02-02 ENCOUNTER — Other Ambulatory Visit: Payer: Self-pay

## 2022-02-02 ENCOUNTER — Ambulatory Visit (INDEPENDENT_AMBULATORY_CARE_PROVIDER_SITE_OTHER): Payer: Medicare Other | Admitting: Family Medicine

## 2022-02-02 ENCOUNTER — Encounter: Payer: Self-pay | Admitting: Family Medicine

## 2022-02-02 VITALS — BP 130/90 | HR 72 | Temp 97.6°F | Ht 67.0 in | Wt 317.0 lb

## 2022-02-02 DIAGNOSIS — C61 Malignant neoplasm of prostate: Secondary | ICD-10-CM | POA: Diagnosis not present

## 2022-02-02 DIAGNOSIS — N1832 Chronic kidney disease, stage 3b: Secondary | ICD-10-CM

## 2022-02-02 DIAGNOSIS — C7951 Secondary malignant neoplasm of bone: Secondary | ICD-10-CM

## 2022-02-02 DIAGNOSIS — R300 Dysuria: Secondary | ICD-10-CM

## 2022-02-02 DIAGNOSIS — I5032 Chronic diastolic (congestive) heart failure: Secondary | ICD-10-CM | POA: Diagnosis not present

## 2022-02-02 DIAGNOSIS — N3281 Overactive bladder: Secondary | ICD-10-CM

## 2022-02-02 MED ORDER — TRAMADOL HCL 50 MG PO TABS: 50.0000 mg | ORAL_TABLET | Freq: Three times a day (TID) | ORAL | 0 refills | Status: AC | PRN

## 2022-02-02 NOTE — Progress Notes (Addendum)
Subjective:    Patient ID: Terry [name], male    DOB: 1941-07-01, 81 y.o.   MRN: [id number]  HPI  01/26/22 Patient states that all last week he was experiencing dysuria.  It was burning every time he would urinate.  He has a history of a retained right ureteral stent right-sided hydronephrosis and chronic kidney disease.  He also has a history of metastatic prostate cancer.  He started drinking more water and the dysuria has improved but he continues to have increased frequency and urgency.  However over the weekend he became progressively weak.  He lost his balance while walking and fell suffering an abrasion to his left forehead.  He also "tore off" his left great toenail.  The nail bed is covered with dry black scab blood.  The toe itself is not erythematous or warm or infected.  However it is sore when I touch the nail bed.  At that time, my plan was: Check CBC and BMP to monitor renal function as well as white blood cell count and hemoglobin given the fact the patient is weak and recently lost his balance and fell.  He denies any symptoms of concussion or intercranial hemorrhage.  Specifically he denies any headache or neurologic deficits.  I cleaned the nailbed with alcohol and peroxide and applied Neosporin, petroleum gauze and wrapped in Coban.  I will treat the urinary tract infection with Keflex 500 mg p.o. 3 times daily for 7 days.  Recheck next week.  Urinalysis shows +3 blood, +2 protein, +3 leukocyte esterase.  Given his complicated history I will send a urine culture.  Office Visit on 01/26/2022  Component Date Value Ref Range Status   Color, Urine 01/26/2022 AMBER (A)  YELLOW Final   APPearance 01/26/2022 CLOUDY (A)  CLEAR Final   Specific Gravity, Urine 01/26/2022 1.025  1.001 - 1.035 Final   pH 01/26/2022 5.5  5.0 - 8.0 Final   Glucose, UA 01/26/2022 NEGATIVE  NEGATIVE Final   Bilirubin Urine 01/26/2022 NEGATIVE  NEGATIVE Final   Ketones, ur 01/26/2022 NEGATIVE  NEGATIVE Final    Hgb urine dipstick 01/26/2022 3+ (A)  NEGATIVE Final   Protein, ur 01/26/2022 2+ (A)  NEGATIVE Final   Nitrite 01/26/2022 NEGATIVE  NEGATIVE Final   Leukocytes,Ua 01/26/2022 3+ (A)  NEGATIVE Final   WBC, UA 01/26/2022 40-60 (A)  0 - 5 /HPF Final   RBC / HPF 01/26/2022 > OR = 60 (A)  0 - 2 /HPF Final   Squamous Epithelial / LPF 01/26/2022 6-10 (A)  < OR = 5 /HPF Final   Bacteria, UA 01/26/2022 MODERATE (A)  NONE SEEN /HPF Final   Crystals 01/26/2022 NONE SEEN  NONE SEEN  /HPF Final   Hyaline Cast 01/26/2022 NONE SEEN  NONE SEEN /LPF Final   Casts 01/26/2022 NONE SEEN  NONE SEEN /LPF Final   Yeast 01/26/2022 NONE SEEN  NONE SEEN /HPF Final   MICRO NUMBER: 01/26/2022 [id number]   Final   SPECIMEN QUALITY: 01/26/2022 Adequate   Final   Sample Source 01/26/2022 NOT GIVEN   Final   STATUS: 01/26/2022 FINAL   Final   ISOLATE 1: 01/26/2022    Final                   Value:Mixed genital flora isolated. These superficial bacteria are not indicative of a urinary tract infection. No further organism identification is warranted on this specimen. If clinically indicated, recollect clean-catch, mid-stream urine and transfer  immediately to Urine  Culture Transport Tube.    Glucose, Bld 01/26/2022 106 (H)  65 - 99 mg/dL Final   Comment: .            Fasting reference interval . For someone without known diabetes, a glucose value between 100 and 125 mg/dL is consistent with prediabetes and should be confirmed with a follow-up test. .    BUN 01/26/2022 26 (H)  7 - 25 mg/dL Final   Creat 01/26/2022 1.86 (H)  0.70 - 1.22 mg/dL Final   eGFR 01/26/2022 36 (L)  > OR = 60 mL/min/1.78m Final   Comment: The eGFR is based on the CKD-EPI 2021 equation. To calculate  the new eGFR from a previous Creatinine or Cystatin C result, go to https://www.kidney.org/professionals/ kdoqi/gfr%5Fcalculator    BUN/Creatinine Ratio 01/26/2022 14  6 - 22 (calc) Final   Sodium 01/26/2022 140  135 - 146 mmol/L Final    Potassium 01/26/2022 4.5  3.5 - 5.3 mmol/L Final   Chloride 01/26/2022 106  98 - 110 mmol/L Final   CO2 01/26/2022 23  20 - 32 mmol/L Final   Calcium 01/26/2022 9.1  8.6 - 10.3 mg/dL Final   Total Protein 01/26/2022 7.9  6.1 - 8.1 g/dL Final   Albumin 01/26/2022 3.7  3.6 - 5.1 g/dL Final   Globulin 01/26/2022 4.2 (H)  1.9 - 3.7 g/dL (calc) Final   AG Ratio 01/26/2022 0.9 (L)  1.0 - 2.5 (calc) Final   Total Bilirubin 01/26/2022 0.3  0.2 - 1.2 mg/dL Final   Alkaline phosphatase (APISO) 01/26/2022 70  35 - 144 U/L Final   AST 01/26/2022 12  10 - 35 U/L Final   ALT 01/26/2022 7 (L)  9 - 46 U/L Final   WBC 01/26/2022 6.9  3.8 - 10.8 Thousand/uL Final   RBC 01/26/2022 4.55  4.20 - 5.80 Million/uL Final   Hemoglobin 01/26/2022 11.4 (L)  13.2 - 17.1 g/dL Final   HCT 01/26/2022 36.7 (L)  38.5 - 50.0 % Final   MCV 01/26/2022 80.7  80.0 - 100.0 fL Final   MCH 01/26/2022 25.1 (L)  27.0 - 33.0 pg Final   MCHC 01/26/2022 31.1 (L)  32.0 - 36.0 g/dL Final   RDW 01/26/2022 14.5  11.0 - 15.0 % Final   Platelets 01/26/2022 210  140 - 400 Thousand/uL Final   MPV 01/26/2022 11.3  7.5 - 12.5 fL Final   Neutro Abs 01/26/2022 4,982  1,500 - 7,800 cells/uL Final   Lymphs Abs 01/26/2022 1,063  850 - 3,900 cells/uL Final   Absolute Monocytes 01/26/2022 676  200 - 950 cells/uL Final   Eosinophils Absolute 01/26/2022 152  15 - 500 cells/uL Final   Basophils Absolute 01/26/2022 28  0 - 200 cells/uL Final   Neutrophils Relative % 01/26/2022 72.2  % Final   Total Lymphocyte 01/26/2022 15.4  % Final   Monocytes Relative 01/26/2022 9.8  % Final   Eosinophils Relative 01/26/2022 2.2  % Final   Basophils Relative 01/26/2022 0.4  % Final   Note 01/26/2022    Final   Comment: This urine was analyzed for the presence of WBC,  RBC, bacteria, casts, and other formed elements.  Only those elements seen were reported. . .      02/02/22 Urine culture just showed contamination despite abnormal urinalysis.  Creatinine  was 1.86.  The abrasion on his left forehead has healed completely.  The bandage was removed from his tire.  He has not been putting any type of antibiotic  ointment.  The toe itself is not erythematous.  There is no warmth.  There is no swelling.  The nail bed continues to have scab blood although it is almost healed.  I gave the patient some Neosporin to put on his toe daily until healed.  He denies any further dysuria.  He states that the weakness and the dysuria have improved.  His biggest concern now is that he is not sleeping.  He states that he sleeps about 2 hours every night due to waking up because of joint pains and pain in his back.  He is taking BC powders despite being on Eliquis.  He is also not been taking his Lasix because it makes him "urinate so much". Past Medical History:  Diagnosis Date   Arthritis    Cancer ([place])    Phreesia 08/04/2020   CHF, chronic (HCC)    diastolic   CKD (chronic kidney disease) stage 3, GFR 30-59 ml/min (HCC)    Diabetes mellitus without complication (HCC)    Dysrhythmia    Hypertension    Hypothyroidism    Lymphedema    Paroxysmal atrial fibrillation (HCC)    Prostate cancer [facility])    Renal insufficiency    Past Surgical History:  Procedure Laterality Date   COLON SURGERY N/A    Phreesia 08/04/2020   Current Outpatient Medications on File Prior to Visit  Medication Sig Dispense Refill   allopurinol (ZYLOPRIM) 300 MG tablet Take 1 tablet (300 mg total) by mouth daily. 90 tablet 3   apixaban (ELIQUIS) 5 MG TABS tablet Take 1 tablet (5 mg total) by mouth 2 (two) times daily. 180 tablet 1   carvedilol (COREG) 25 MG tablet Take 1 tablet (25 mg total) by mouth 2 (two) times daily with a meal. 180 tablet 1   cephALEXin (KEFLEX) 500 MG capsule Take 1 capsule (500 mg total) by mouth 3 (three) times daily. 21 capsule 0   furosemide (LASIX) 20 MG tablet Take 1 tablet (20 mg total) by mouth daily. 90 tablet 1   hydrochlorothiazide (HYDRODIURIL) 25 MG  tablet Take 1 tablet (25 mg total) by mouth daily. 90 tablet 1   levothyroxine (SYNTHROID) 25 MCG tablet Take 1 tablet (25 mcg total) by mouth daily before breakfast. 90 tablet 1   mirabegron ER (MYRBETRIQ) 50 MG TB24 tablet Take 1 tablet (50 mg total) by mouth daily. 90 tablet 1   Multiple Vitamins-Minerals (MULTIVITAMINS THER. W/MINERALS) TABS Take 1 tablet by mouth daily.     polyethylene glycol (MIRALAX / GLYCOLAX) 17 g packet Take 17 g by mouth daily as needed for mild constipation. 14 each 0   senna-docusate (SENOKOT-S) 8.6-50 MG tablet Take 2 tablets by mouth at bedtime as needed for mild constipation. 180 tablet 1   spironolactone (ALDACTONE) 25 MG tablet Take 1 tablet (25 mg total) by mouth daily. 90 tablet 1   tamsulosin (FLOMAX) 0.4 MG CAPS capsule Take 1 capsule (0.4 mg total) by mouth daily. 30 capsule 3   telmisartan (MICARDIS) 40 MG tablet Take 1 tablet (40 mg total) by mouth daily. 90 tablet 1   No current facility-administered medications on file prior to visit.   Allergies  Allergen Reactions   Lisinopril     FACIAL SWELLING   Social History   Socioeconomic History   Marital status: Widowed    Spouse name: Not on file   Number of children: 2   Years of education: Not on file   Highest education level: Not on  file  Occupational History   Not on file  Tobacco Use   Smoking status: Never   Smokeless tobacco: Never  Vaping Use   Vaping Use: Never used  Substance and Sexual Activity   Alcohol use: Yes    Comment: rarely   Drug use: Yes    Types: Marijuana   Sexual activity: Not Currently  Other Topics Concern   Not on file  Social History Narrative   Lives with son. Daughter lives in [place].   Social Determinants of Health   Financial Resource Strain: Low Risk    Difficulty of Paying Living Expenses: Not hard at all  Food Insecurity: No Food Insecurity   Worried About Charity fundraiser in the Last Year: Never true   [place] in the Last Year: Never  true  Transportation Needs: No Transportation Needs   Lack of Transportation (Medical): No   Lack of Transportation (Non-Medical): No  Physical Activity: Inactive   Days of Exercise per Week: 0 days   Minutes of Exercise per Session: 0 min  Stress: No Stress Concern Present   Feeling of Stress : Not at all  Social Connections: Moderately Isolated   Frequency of Communication with Friends and Family: More than three times a week   Frequency of Social Gatherings with Friends and Family: More than three times a week   Attends Religious Services: 1 to 4 times per year   Active Member of [employer] or Organizations: No   Attends Archivist Meetings: Never   Marital Status: Widowed  Human resources officer Violence: Not At Risk   Fear of Current or Ex-Partner: No   Emotionally Abused: No   Physically Abused: No   Sexually Abused: No    Past Medical History:  Diagnosis Date   Arthritis    Cancer ([place])    Phreesia 08/04/2020   CHF, chronic (HCC)    diastolic   CKD (chronic kidney disease) stage 3, GFR 30-59 ml/min (HCC)    Diabetes mellitus without complication (HCC)    Dysrhythmia    Hypertension    Hypothyroidism    Lymphedema    Paroxysmal atrial fibrillation (HCC)    Prostate cancer ([place])    Renal insufficiency       Review of Systems     Objective:   Physical Exam Vitals reviewed.  Constitutional:      General: He is not in acute distress.    Appearance: He is obese. He is not ill-appearing, toxic-appearing or diaphoretic.  HENT:     Head:   Cardiovascular:     Rate and Rhythm: Normal rate. Rhythm irregular.     Heart sounds: Normal heart sounds.  Pulmonary:     Effort: Pulmonary effort is normal. No respiratory distress.     Breath sounds: No wheezing or rales.  Abdominal:     General: Abdomen is flat. Bowel sounds are normal.     Palpations: Abdomen is soft.  Musculoskeletal:     Right lower leg: Edema present.     Left lower leg: Edema present.        Feet:  Neurological:     Mental Status: He is alert.       Assessment & Plan:  OAB (overactive bladder)  Prostate cancer metastatic to bone (HCC)  Chronic diastolic congestive heart failure (HCC)  Dysuria  Stage 3b chronic kidney disease ([place]) Dysuria has resolved.  Perhaps the patient had a mild prostate infection that the urine culture was unable  to detect.  It is also possible that the dysuria is due to irritation from the retained ureteral stent however at the present time the patient is asymptomatic so additional antibiotics are not warranted.  I recommended that he use Neosporin daily on the toe until it is healed.  Also recommended the patient resume his Lasix to avoid pulmonary edema.  Patient states that he is not sleeping well due to pain so I refilled his tramadol.  Also explained the patient that he should not be taking Eliquis and BC powder due to increased bleeding risk.  I would encourage him to take Tylenol and tramadol.   Patients daughter requests home health nursing.  He needs assistance for med management due to non-compliance and PT/OT for strengthening as he occasionally falls due to deconditioning.  Bath aide and a HHSW would be helpful with the home situation (son with mental needs, etc).

## 2022-02-11 ENCOUNTER — Encounter: Payer: Self-pay | Admitting: Oncology

## 2022-02-18 ENCOUNTER — Ambulatory Visit: Payer: Medicare Other | Admitting: Urology

## 2022-02-18 ENCOUNTER — Encounter: Payer: Self-pay | Admitting: Urology

## 2022-02-22 ENCOUNTER — Telehealth: Payer: Self-pay | Admitting: *Deleted

## 2022-02-22 ENCOUNTER — Telehealth: Payer: Medicare Other

## 2022-02-22 NOTE — Telephone Encounter (Signed)
? ?  Care Management  ? ?Follow Up Note ? ? ?02/22/2022 ?Name: Terry [name] MRN: [id number] DOB: 1941/02/01 ? ? ?Referred by: Susy Frizzle, MD ?Reason for referral : No chief complaint on file. ? ? ?A second unsuccessful telephone outreach was attempted today. The patient was referred to the case management team for assistance with care management and care coordination.  ? ?Follow Up Plan: Telephone follow up appointment with care management team member scheduled for:  upon care guide rescheduling. ? ?[name] RNC, BSN ?RN Case Manager ?[place] ?[phone] ? ? ?

## 2022-02-25 ENCOUNTER — Telehealth: Payer: Self-pay | Admitting: *Deleted

## 2022-02-25 NOTE — Chronic Care Management (AMB) (Signed)
?  Care Management  ? ?Note ? ?02/25/2022 ?Name: [name] MRN: [id number] DOB: [birth year] ? ?Terry [name] is a 81 y.o. year old male who is a primary care patient of Dennard Schaumann, [name], MD and is actively engaged with the care management team. I reached out to [place] by phone today to assist with re-scheduling a follow up visit with the RN Case Manager ? ?Follow up plan: ?Unsuccessful telephone outreach attempt made. A HIPAA compliant phone message was left for the patient providing contact information and requesting a return call.  ? ?Wilbern Pennypacker, CCMA ?Care Guide, Embedded Care Coordination ?[place]  Care Management  ?Direct Dial: [phone] ? ? ?

## 2022-03-09 NOTE — Chronic Care Management (AMB) (Signed)
?  Care Management  ? ?Note ? ?03/09/2022 ?Name: Terry [name] MRN: [id number] DOB: 11-Dec-1940 ? ?Terry [name] is a 81 y.o. year old male who is a primary care patient of Dennard Schaumann, [name], MD and is actively engaged with the care management team. I reached out to [place] by phone today to assist with re-scheduling a follow up visit with the RN Case Manager ? ?Follow up plan: ?Telephone appointment with care management team member scheduled for: 03/15/2022 ? ?Haider Hornaday, CCMA ?Care Guide, Embedded Care Coordination ?  Care Management  ?Direct Dial: [phone] ? ? ?

## 2022-03-15 ENCOUNTER — Ambulatory Visit (INDEPENDENT_AMBULATORY_CARE_PROVIDER_SITE_OTHER): Payer: Medicare Other | Admitting: *Deleted

## 2022-03-15 DIAGNOSIS — I5032 Chronic diastolic (congestive) heart failure: Secondary | ICD-10-CM

## 2022-03-15 DIAGNOSIS — I10 Essential (primary) hypertension: Secondary | ICD-10-CM

## 2022-03-15 NOTE — Patient Instructions (Signed)
Visit Information ? ?Thank you for taking time to visit with me today. Please don't hesitate to contact me if I can be of assistance to you before our next scheduled telephone appointment. ? ?Following are the goals we discussed today:  ?Take medications as prescribed   ?Attend all scheduled provider appointments ?Perform all self care activities independently  ?Perform IADL's (shopping, preparing meals, housekeeping, managing finances) independently ?Call provider office for new concerns or questions  ?call office if I gain more than 2 pounds in one day or 5 pounds in one week ?do ankle pumps when sitting ?keep legs up while sitting ?track weight in diary ?watch for swelling in feet, ankles and legs every day ?weigh myself daily ?follow rescue plan if symptoms flare-up ?track symptoms and what helps feel better or worse ?check blood pressure weekly ?write blood pressure results in a log or diary ?take blood pressure log to all doctor appointments ?take medications for blood pressure exactly as prescribed ?report new symptoms to your doctor ?eat more whole grains, fruits and vegetables, lean meats and healthy fats ?Practice good handwashing, wear a mask as needed, avoid sick people ?Call RN care manager if you have any questions at [phone] ?fall prevention strategies: change position slowly, use assistive device such as walker or cane (per provider recommendations) when walking, keep walkways clear, have good lighting in room. It is important to contact your provider if you have any falls, maintain muscle strength/tone by exercise per provider recommendations. ? ?Our next appointment is by telephone on 05/31/22 at 9 am ? ?Please call the care guide team at [phone] if you need to cancel or reschedule your appointment.  ? ?If you are experiencing a Mental Health or [place] or need someone to talk to, please call the Suicide and Crisis Lifeline: 988 ?call the [place] National Suicide Prevention  Lifeline: 323-496-1562 or TTY: (516) 786-7058 TTY 6841052317) to talk to a trained counselor ?call 1-800-273-TALK (toll free, 24 hour hotline) ?go to [facility] [address] [place] [phone]) ?call 911  ? ?Patient verbalizes understanding of instructions and care plan provided today and agrees to view in [place]. Active MyChart status confirmed with patient.   ? ?[name] RNC, BSN ?RN Case Manager ?[place] ?[phone] ? ?

## 2022-03-15 NOTE — Chronic Care Management (AMB) (Signed)
?Chronic Care Management  ? ?CCM RN Visit Note ? ?03/15/2022 ?Name: Terry [name] MRN: [id number] DOB: March 09, 1941 ? ?Subjective: ?Terry [name] is a 81 y.o. year old male who is a primary care patient of Pickard, [name], MD. The care management team was consulted for assistance with disease management and care coordination needs.   ? ?Engaged with patient by telephone for follow up visit in response to provider referral for case management and/or care coordination services.  ? ?Consent to Services:  ?The patient was given information about Chronic Care Management services, agreed to services, and gave verbal consent prior to initiation of services.  Please see initial visit note for detailed documentation.  ? ?Patient agreed to services and verbal consent obtained.  ? ?Assessment: Review of patient past medical history, allergies, medications, health status, including review of consultants reports, laboratory and other test data, was performed as part of comprehensive evaluation and provision of chronic care management services.  ? ?SDOH (Social Determinants of Health) assessments and interventions performed:   ? ?CCM Care Plan ? ?Allergies  ?Allergen Reactions  ? Lisinopril   ?  FACIAL SWELLING  ? ? ?Outpatient Encounter Medications as of 03/15/2022  ?Medication Sig  ? allopurinol (ZYLOPRIM) 300 MG tablet Take 1 tablet (300 mg total) by mouth daily.  ? apixaban (ELIQUIS) 5 MG TABS tablet Take 1 tablet (5 mg total) by mouth 2 (two) times daily.  ? carvedilol (COREG) 25 MG tablet Take 1 tablet (25 mg total) by mouth 2 (two) times daily with a meal.  ? furosemide (LASIX) 20 MG tablet Take 1 tablet (20 mg total) by mouth daily.  ? hydrochlorothiazide (HYDRODIURIL) 25 MG tablet Take 1 tablet (25 mg total) by mouth daily.  ? levothyroxine (SYNTHROID) 25 MCG tablet Take 1 tablet (25 mcg total) by mouth daily before breakfast.  ? mirabegron ER (MYRBETRIQ) 50 MG TB24 tablet Take 1 tablet (50 mg total) by mouth daily.  ?  Multiple Vitamins-Minerals (MULTIVITAMINS THER. W/MINERALS) TABS Take 1 tablet by mouth daily.  ? polyethylene glycol (MIRALAX / GLYCOLAX) 17 g packet Take 17 g by mouth daily as needed for mild constipation.  ? senna-docusate (SENOKOT-S) 8.6-50 MG tablet Take 2 tablets by mouth at bedtime as needed for mild constipation.  ? spironolactone (ALDACTONE) 25 MG tablet Take 1 tablet (25 mg total) by mouth daily.  ? tamsulosin (FLOMAX) 0.4 MG CAPS capsule Take 1 capsule (0.4 mg total) by mouth daily.  ? telmisartan (MICARDIS) 40 MG tablet Take 1 tablet (40 mg total) by mouth daily.  ? cephALEXin (KEFLEX) 500 MG capsule Take 1 capsule (500 mg total) by mouth 3 (three) times daily. (Patient not taking: Reported on 03/15/2022)  ? ?No facility-administered encounter medications on file as of 03/15/2022.  ? ? ?Patient Active Problem List  ? Diagnosis Date Noted  ? Asymptomatic microscopic hematuria 01/22/2022  ? Benign prostatic hyperplasia with lower urinary tract symptoms 01/22/2022  ? Nocturia 01/22/2022  ? Retained ureteral stent 01/22/2022  ? Goals of care, counseling/discussion 09/04/2021  ? Sepsis ([place]) 10/24/2020  ? Complicated UTI (urinary tract infection) 10/24/2020  ? Chronic heart failure with preserved ejection fraction (HFpEF) ([place]) 10/24/2020  ? CHF, chronic ([place])   ? Prostate cancer ([place])   ? Congestive heart failure ([place]) 07/02/2020  ? Macrocytic anemia 06/30/2020  ? Elevated PSA 06/30/2020  ? Cellulitis of lower extremity 06/30/2020  ? Prediabetes 01/04/2019  ? Paroxysmal atrial fibrillation ([place]) 01/01/2019  ? Risk for falls 12/27/2017  ? 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 Right ureteral stone 10/01/2017  ? Iron deficiency anemia due to chronic blood loss 06/22/2016  ? 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  ? 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  ? Depression with anxiety 06/19/2015  ? CKD (chronic kidney disease) stage 3, GFR 30-59 ml/min (HCC) 05/26/2015  ? Essential hypertension 11/01/2014  ? Gouty  arthritis of toe 11/01/2014  ? Morbid obesity due to excess calories ([place]) 11/01/2014  ? Primary osteoarthritis of both knees 11/01/2014  ? ? ?Conditions to be addressed/monitored:CHF and HTN ? ?Care Plan : RN Care Manager plan of care  ?Updates made by Kassie Mends, RN since 03/15/2022 12:00 AM  ?  ? ?Problem: No plan of care established for management of chronic disease states (CHF, HTN, cancer)   ?Priority: High  ?  ? ?Long-Range Goal: Development of plan of care for chronic disease management (CHF, HTN, cancer)   ?Start Date: 09/28/2021  ?Expected End Date: 09/11/2022  ?Priority: High  ?Note:   ? ?Current Barriers:  ?Knowledge Deficits related to plan of care for management of CHF and HTN  ?Chronic Disease Management support and education needs related to CHF and HTN ?Patient reports his adult son lives with him and provides assistance if needed, has adult daughter Terry [name]), patient reports he still drives, is overall independent with aspects of his care, requires assistance getting up steps, uses shower seat for bathing.  Patient reports he has prostate cancer and with last scan/ tests, etc reports " things are about the same, stabilized for now", sees oncologist Terry [name], had CT scan 09/30/21. Patient reports he has a scale but does not weigh often, last weight one week ago 317 pounds at primary care provider office, has blood pressure cuff and checks blood pressure " on occasion"  Patient reports he does not adhere to a special diet but tries " to eat as healthy as I can"  ?Patient reports he has no further burning with urination and is finished with antibiotics ?Patient had a fall few weeks ago, denies any injury ? ?RNCM Clinical Goal(s):  ?Patient will verbalize understanding of plan for management of CHF and HTN as evidenced by patient report, EHR review ?take all medications exactly as prescribed and will call provider for medication related questions as evidenced by patient report, EHR  review, collaboration with CCM pharmacist    ?attend all scheduled medical appointments: (no upcoming scheduled appointments) as evidenced by patient report, EHR review and        through collaboration with RN Care manager, provider, and care team.  ? ?Interventions: ?1:1 collaboration with primary care provider regarding development and update of comprehensive plan of care as evidenced by provider attestation and co-signature ?Inter-disciplinary care team collaboration (see longitudinal plan of care) ?Evaluation of current treatment plan related to  self management and patient's adherence to plan as established by provider ? ?Hypertension Interventions: ?Last practice recorded BP readings:  ?BP Readings from Last 3 Encounters:  ?09/23/21 (!) 142/71  ?08/28/21 (!) 145/71  ?08/27/21 (!) 160/68  ?Most recent eGFR/CrCl:  ?Lab Results  ?Component Value Date  ? EGFR 43 (L) 08/27/2021  ?  No components found for: CRCL ? ?Evaluation of current treatment plan related to hypertension self management and patient's adherence to plan as established by provider ?Reviewed medications with patient and discussed importance of compliance ?Discussed complications of poorly controlled blood pressure such as heart disease, stroke, circulatory complications, vision complications, kidney impairment, sexual dysfunction ?Reinforced low sodium diet,  importance of reading food labels ?Reviewed good handwashing and wearing a mask as needed, avoid sick people ?Pain assessment completed ?Encouraged patient to drink adequate fluids, preferably water ?Reviewed safety precautions ? ?Heart Failure Interventions: ?Discussed importance of daily weight and advised patient to weigh and record daily ?Discussed the importance of keeping all appointments with provider ?Reviewed heart failure action plan, importance of calling doctor early for change in healthy status, symptoms  ?Reviewed signs/ symptoms urinary tract infection ? ?Patient Goals/Self-Care  Activities: ?Take medications as prescribed   ?Attend all scheduled provider appointments ?Perform all self care activities independently  ?Perform IADL's (shopping, preparing meals, housekeeping, managing financ

## 2022-04-14 DIAGNOSIS — I509 Heart failure, unspecified: Secondary | ICD-10-CM

## 2022-04-14 DIAGNOSIS — I110 Hypertensive heart disease with heart failure: Secondary | ICD-10-CM

## 2022-05-13 NOTE — Addendum Note (Signed)
Addended by: Jenna Luo T on: 05/13/2022 06:53 AM   Modules accepted: Orders

## 2022-05-24 NOTE — Progress Notes (Unsigned)
Cardiology Office Note  Date:  05/25/2022   ID:  Terry Terry [name], DOB 1941/10/27, MRN [id number]  PCP:  Susy Frizzle, MD   Chief Complaint  Terry [name] presents with   Follow-up    6 month F/U-No new cardiac concerns    HPI:  Terry Terry [name] is a 81 year old gentleman with past medical history of Lower extremity edema Chronic kidney disease Morbid obesity Cellulitis Essential hypertension Incontinence on Lasix Who presents for follow-up of his congestive heart failure, leg edema  Last seen by myself in clinic July 2022  presenting in a wheelchair, Lives with his son and son-in-law Wife died,   Not using ace wraps  Not using lymphedema pumps Leg swelling significant, weight trending higher  Not taking lasix on a regular basis Bothered by incontinence Very sedentary, sitting most of Terry day with her legs down  Lab work reviewed CR 1.86  EKG personally reviewed by myself on todays visit Normal sinus rhythm rate 64 bpm no significant ST-T wave changes   Oct 28 2020  fever and generalized weakness-found to have complicated UTI, AKI-started on IV antibiotics and admitted to Terry hospitalist service  in [facility] hospital for prostate cancer Oct 2021   PMH:   has a past medical history of Arthritis, Cancer ([place]), CHF, chronic ([place]), CKD (chronic kidney disease) stage 3, GFR 30-59 ml/min ([place]), Diabetes mellitus without complication ([place]), Dysrhythmia, Hypertension, Hypothyroidism, Lymphedema, Paroxysmal atrial fibrillation ([place]), Prostate cancer ([place]), and Renal insufficiency.  PSH:    Past Surgical History:  Procedure Laterality Date   COLON SURGERY N/A    [employer] 08/04/2020    Current Outpatient Medications  Medication Sig Dispense Refill   allopurinol (ZYLOPRIM) 300 MG tablet Take 1 tablet (300 mg total) by mouth daily. 90 tablet 3   apixaban (ELIQUIS) 5 MG TABS tablet Take 1 tablet (5 mg total) by mouth 2 (two) times daily. 180 tablet 1   carvedilol (COREG) 25 MG  tablet Take 1 tablet (25 mg total) by mouth 2 (two) times daily with a meal. 180 tablet 1   furosemide (LASIX) 20 MG tablet Take 1 tablet (20 mg total) by mouth daily. 90 tablet 1   hydrochlorothiazide (HYDRODIURIL) 25 MG tablet Take 1 tablet (25 mg total) by mouth daily. 90 tablet 1   levothyroxine (SYNTHROID) 25 MCG tablet Take 1 tablet (25 mcg total) by mouth daily before breakfast. 90 tablet 1   mirabegron ER (MYRBETRIQ) 50 MG TB24 tablet Take 1 tablet (50 mg total) by mouth daily. 90 tablet 1   Multiple Vitamins-Minerals (MULTIVITAMINS THER. W/MINERALS) TABS Take 1 tablet by mouth daily.     polyethylene glycol (MIRALAX / GLYCOLAX) 17 g packet Take 17 g by mouth daily as needed for mild constipation. 14 each 0   senna-docusate (SENOKOT-S) 8.6-50 MG tablet Take 2 tablets by mouth at bedtime as needed for mild constipation. 180 tablet 1   spironolactone (ALDACTONE) 25 MG tablet Take 1 tablet (25 mg total) by mouth daily. 90 tablet 1   tamsulosin (FLOMAX) 0.4 MG CAPS capsule Take 1 capsule (0.4 mg total) by mouth daily. 30 capsule 3   telmisartan (MICARDIS) 40 MG tablet Take 1 tablet (40 mg total) by mouth daily. 90 tablet 1   traMADol (ULTRAM) 50 MG tablet Take 50 mg by mouth every 8 (eight) hours as needed.     No current facility-administered medications for this visit.     Allergies:   Lisinopril   Social History:  Terry Terry [name]  reports that  he has never smoked. He has never used smokeless tobacco. He reports current alcohol use. He reports current drug use. Drug: Marijuana.   Family History:   family history includes Diabetes in his son; Hypertension in his mother.    Review of Systems: Review of Systems  Constitutional: Negative.   HENT: Negative.    Respiratory: Negative.    Cardiovascular:  Positive for leg swelling.  Gastrointestinal: Negative.   Musculoskeletal: Negative.   Neurological: Negative.   Psychiatric/Behavioral: Negative.    All other systems reviewed and are  negative.   PHYSICAL EXAM: VS:  BP 130/90 (BP Location: Right Arm, Terry [name] Position: Sitting, Cuff Size: Large)   Pulse 64   Ht '5\' 7"'$  (1.702 m)   Wt (!) 323 lb (146.5 kg)   SpO2 98%   BMI 50.59 kg/m  , BMI Body mass index is 50.59 kg/m. Constitutional:  oriented to person, place, and time. No distress.  HENT:  Head: Grossly normal Eyes:  no discharge. No scleral icterus.  Neck: No JVD, no carotid bruits  Cardiovascular: Regular rate and rhythm, no murmurs appreciated 3+ pitting woody leg edema Pulmonary/Chest: Clear to auscultation bilaterally, no wheezes or rails Abdominal: Soft.  no distension.  no tenderness.  Musculoskeletal: Normal range of motion Neurological:  normal muscle tone. Coordination normal. No atrophy Skin: Skin warm and dry Psychiatric: normal affect, pleasant   Recent Labs: 08/27/2021: TSH 4.04 01/26/2022: ALT 7; BUN 26; Creat 1.86; Hemoglobin 11.4; Platelets 210; Potassium 4.5; Sodium 140    Lipid Panel Lab Results  Component Value Date   CHOL 112 06/25/2020   HDL 35 (L) 06/25/2020   LDLCALC 63 06/25/2020   TRIG 65 06/25/2020      Wt Readings from Last 3 Encounters:  05/25/22 (!) 323 lb (146.5 kg)  02/02/22 (!) 317 lb (143.8 kg)  01/26/22 (!) 314 lb (142.4 kg)     ASSESSMENT AND PLAN:  Problem List Items Addressed This Visit       Cardiology Problems   Essential hypertension   Relevant Orders   EKG 12-Lead   Paroxysmal atrial fibrillation (HCC)   Relevant Orders   EKG 12-Lead   Congestive heart failure ([place]) - Primary   Relevant Orders   EKG 12-Lead     Other   CKD (chronic kidney disease) stage 3, GFR 30-59 ml/min (HCC)   Relevant Orders   EKG 12-Lead   Other Visit Diagnoses     Morbid obesity ([place])       Lymphedema       Relevant Orders   EKG 12-Lead     Lymphedema Sitting much of Terry day with legs down not using his lymphedema compression pumps, not on his Lasix, no ace wraps Recommend he restart his Lasix 20 daily  with spironolactone and HCTZ Suggested leg elevation when sitting, lymphedema compression pumps 1 hour twice a day  Chronic diastolic CHF Likely exacerbated by morbid obesity Recommend he restart Lasix 20 daily,  Weight trending upwards concerning for CHF symptoms Up 6 pounds in 4 months  Morbid obesity Difficult situation given sedentary lifestyle, morbid obesity, immobility High risk of hospitalization Failure to thrive at home, reports he was doing better when he had visiting nurses that applied Ace wraps  History of severe right hydronephrosis with ureteral stent   recovered  Paroxysmal atrial fibrillation NSR today continue Eliquis, also high risk of DVT given sedentary state Stressed importance of medication compliance  History of cellulitis At risk given his lymphedema Recommend lymphedema pumps  as above  Prostate cancer Stable pelvic adenopathy   Total encounter time more than 30 minutes  Greater than 50% was spent in counseling and coordination of care with Terry Terry [name]    Signed, Esmond Plants, M.D., Ph.D. [place], [place]

## 2022-05-25 ENCOUNTER — Encounter: Payer: Self-pay | Admitting: Cardiovascular Disease

## 2022-05-25 ENCOUNTER — Ambulatory Visit (INDEPENDENT_AMBULATORY_CARE_PROVIDER_SITE_OTHER): Payer: Medicare Other | Admitting: Cardiovascular Disease

## 2022-05-25 VITALS — BP 130/90 | HR 64 | Ht 67.0 in | Wt 323.0 lb

## 2022-05-25 DIAGNOSIS — I5032 Chronic diastolic (congestive) heart failure: Secondary | ICD-10-CM | POA: Diagnosis not present

## 2022-05-25 DIAGNOSIS — I10 Essential (primary) hypertension: Secondary | ICD-10-CM | POA: Diagnosis not present

## 2022-05-25 DIAGNOSIS — I890 Lymphedema, not elsewhere classified: Secondary | ICD-10-CM | POA: Diagnosis not present

## 2022-05-25 DIAGNOSIS — I480 Paroxysmal atrial fibrillation: Secondary | ICD-10-CM

## 2022-05-25 DIAGNOSIS — N1832 Chronic kidney disease, stage 3b: Secondary | ICD-10-CM | POA: Diagnosis not present

## 2022-05-25 NOTE — Patient Instructions (Signed)
Please restart lymphedema compression pumps  Medication Instructions:  No changes  If you need a refill on your cardiac medications before your next appointment, please call your pharmacy.   Lab work: No new labs needed  Testing/Procedures: No new testing needed  Follow-Up: At [facility], you and your health needs are our priority.  As part of our continuing mission to provide you with exceptional heart care, we have created designated Provider Care Teams.  These Care Teams include your primary Cardiologist (physician) and Advanced Practice Providers (APPs -  Physician Assistants and Nurse Practitioners) who all work together to provide you with the care you need, when you need it.  You will need a follow up appointment in 12 months  Providers on your designated Care Team:   Murray Hodgkins, NP Christell Faith, PA-C Cadence Kathlen Mody, [place]  COVID-19 Vaccine Information can be found at: [email or web address] For questions related to vaccine distribution or appointments, please email [email or web address] or call [phone].

## 2022-05-31 ENCOUNTER — Telehealth: Payer: Self-pay | Admitting: *Deleted

## 2022-05-31 ENCOUNTER — Telehealth: Payer: Self-pay

## 2022-05-31 NOTE — Telephone Encounter (Signed)
  Care Management   Follow Up Note   05/31/2022 Name: Terry [name] MRN: [id number] DOB: 06-May-1941   Referred by: Susy Frizzle, MD Reason for referral : Chronic Care Management (CHF, HTN)   An unsuccessful telephone outreach was attempted today. The patient was referred to the case management team for assistance with care management and care coordination.   Follow Up Plan: Telephone follow up appointment with care management team member scheduled for:  upon care guide rescheduling.  [name] RNC, BSN RN Case Manager [facility] [phone]

## 2022-06-09 ENCOUNTER — Encounter: Payer: Self-pay | Admitting: *Deleted

## 2022-06-09 ENCOUNTER — Ambulatory Visit: Payer: Self-pay | Admitting: *Deleted

## 2022-06-09 NOTE — Patient Instructions (Signed)
Visit Information  Thank you for taking time to visit with me today. Please don't hesitate to contact me if I can be of assistance to you.   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 PHQ2/PHQ9 Depression Screening Completed, and Results Reviewed with Patient. Suicidal Ideation/Homicidal Ideation Assessed: None Present. Solution-Focused Strategies Developed. Deep Breathing Exercises, Relaxation Techniques, and Mindfulness Meditation Strategies Taught, and Encouraged Daily. Active Listening/Reflection Utilized.  Emotional Support Provided. Behavioral Activation Reviewed. Problem Solving/Task Centered Solutions Emphasized. Provided Psychoeducation for Mental Health Needs.  Provided Brief Cognitive Behavioral Therapy.  Quality of Sleep Assessed, and Sleep Hygiene Techniques Promoted.  Participation in Counseling Encouraged.  Verbalization of Feelings Promoted. Crisis Resource Education/Information Provided.  Discussed Referral to Psychiatrist for Medication Management. Discussed Referral to Therapist for Psychotherapeutic Counseling Services. Audiological scientist for Independent Review. Review the Following List of Resources and [employer], Mailed on 06/09/2022, and Be Prepared to Discuss During Next Scheduled Telephone Outreach Call:  ~ [employer] (Supplemental Nutrition Assistance Program) Application  ~ Medicaid Application  Discussed Advanced Directives (Living Will and [place]) Documents.  ~ Mailed Advanced Directives Documents on 06/09/2022. Discussed Geophysical data processor. ~ Confirmed with Chiropractor that Disabled [employer] Son Already Haslet Maximum Amount of  Benefits. Collaborated with Beverly Milch, Pharmacist for Primary Care Provider, Dr. Jenna Luo to Inquire About Medication Assistance Programs. Collaborated with [employer]. ~ In Network/Preferred Provider [employer] List Mailed on 06/09/2022. Collaborated with Primary Care Provider, Dr. Jenna Luo to Request Order for [place] for Wound Care and Dressing Changes.        Our next appointment is by telephone on 06/16/2022 at 9:15 m.  Please call the care guide team at [phone] if you need to cancel or reschedule your appointment.   If you are experiencing a Mental Health or [place] or need someone to talk to, please call the Suicide and Crisis Lifeline: 988 call the [place] National Suicide Prevention Lifeline: 564-167-4365 or TTY: 336 535 3729 TTY (215) 601-5797) to talk to a trained counselor call 1-800-273-TALK (toll free, 24 hour hotline) go to [facility] [address] [place] [phone]) call the [place]: [phone] call 911  Patient verbalizes understanding of instructions and care plan provided today and agrees to view in [place]. Active MyChart status and patient understanding of how to access instructions and care plan via MyChart confirmed with patient.     Telephone follow up appointment with care management team member scheduled for:  06/16/2022 at 9:15 am.  Nat Christen, BSW, MSW, [place]  Licensed Clinical Social Worker  [place]  Mailing [place]. [address], [place], [place] [address] Physical Address-[address] [place], [place] [address] Toll Free Main # [phone] Fax # [phone] Cell # [phone] [email or web address]

## 2022-06-09 NOTE — Patient Outreach (Signed)
  Care Coordination   Initial Visit Note   06/09/2022 Name: Terry [name] MRN: [id number] DOB: 1941-03-03  Terry [name] is a 81 y.o. year old male who sees Pickard, [name], MD for primary care. I spoke with  Vincente Liberty by phone today.  What matters to the patients health and wellness today?  1. Receiving Referral to Pharmacy for Medication Assistance. 2. Completing Advanced Directives (Living Will and [place]) Documents.  3. Receiving [place] for Wound Care and Dressing Changes.  4.  Applying for [employer] Risk manager Nutrition Assistance Program).  5.  Apply for Adult Medicaid.  6.  Obtaining Dental Services Through In Network/Preferred Provider with [facility].   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 PHQ2/PHQ9 Depression Screening Completed, and Results Reviewed with Patient. Suicidal Ideation/Homicidal Ideation Assessed: None Present. Solution-Focused Strategies Developed. Deep Breathing Exercises, Relaxation Techniques, and Mindfulness Meditation Strategies Taught, and Encouraged Daily. Active Listening/Reflection Utilized.  Emotional Support Provided. Behavioral Activation Reviewed. Problem Solving/Task Centered Solutions Emphasized. Provided Psychoeducation for Mental Health Needs.  Provided Brief Cognitive Behavioral Therapy.  Quality of Sleep Assessed, and Sleep Hygiene Techniques Promoted.  Participation in Counseling Encouraged.  Verbalization of Feelings Promoted. Crisis Resource Education/Information Provided.  Discussed Referral to Psychiatrist for Medication Management. Discussed Referral to Therapist for Psychotherapeutic Counseling Services. Audiological scientist for Independent Review. Review the Following List  of Resources and [employer], Mailed on 06/09/2022, and Be Prepared to Discuss During Next Scheduled Telephone Outreach Call:  ~ [employer] (Supplemental Nutrition Assistance Program) Application  ~ Medicaid Application  Discussed Advanced Directives (Living Will and [place]) Documents.  ~ Mailed Advanced Directives Documents on 06/09/2022. Discussed Geophysical data processor. ~ Confirmed with Chiropractor that Disabled [employer] Son Already [place] Maximum Amount of Benefits. Collaborated with Beverly Milch, Pharmacist for Primary Care Provider, Dr. Jenna Luo to Inquire About Medication Assistance Programs. Collaborated with [employer]. ~ In Network/Preferred Provider [employer] List Mailed on 06/09/2022. Collaborated with Primary Care Provider, Dr. Jenna Luo to Request Order for [place] for Wound Care and Dressing Changes.        SDOH assessments and interventions completed:   Yes SDOH Interventions Today    Flowsheet Row Most Recent Value  SDOH Interventions   Food Insecurity Interventions Intervention Not Indicated  Financial Strain Interventions Intervention Not Indicated  Housing Interventions Intervention Not Indicated  Physical Activity Interventions Patient Refused  Stress Interventions Intervention Not Indicated  Social Connections Interventions Intervention Not Indicated  Transportation Interventions Intervention Not Indicated       Care Coordination Interventions Activated:  Yes  Care Coordination Interventions:  Yes, provided.  Follow up plan: Follow up call scheduled for 06/16/2022 at 9:15 am.  Encounter Outcome:  Pt. Visit Completed.  Nat Christen, BSW, MSW, LCSW  Licensed Education officer, environmental Health System  Mailing  [place] [address], [place], [place] [address] Physical Address-[address], [place], [place] [address] Toll Free Main # [phone] Fax # [phone] Cell # [phone] [email or web address]

## 2022-06-16 ENCOUNTER — Encounter: Payer: Self-pay | Admitting: *Deleted

## 2022-06-16 ENCOUNTER — Other Ambulatory Visit: Payer: Self-pay | Admitting: *Deleted

## 2022-06-16 ENCOUNTER — Telehealth: Payer: Self-pay | Admitting: Family Medicine

## 2022-06-16 NOTE — Progress Notes (Signed)
  Chronic Care Management   Outreach Note  06/16/2022 Name: Terry [name] MRN: [id number] DOB: 12-04-1940  Referred by: Susy Frizzle, MD Reason for referral : No chief complaint on file.   An unsuccessful telephone outreach was attempted today. The patient was referred to the pharmacist for assistance with care management and care coordination.   Follow Up Plan:   Tatjana Dellinger Upstream Scheduler

## 2022-06-16 NOTE — Patient Outreach (Signed)
  Care Coordination   06/16/2022  Name: Terry [name] MRN: [id number] DOB: [birth year]   Care Coordination Outreach Attempts:  An unsuccessful telephone outreach was attempted today to offer the patient information about available care coordination services as a benefit of their health plan. HIPAA compliant messages left on voicemail, providing contact information, encouraging patient to return CSW's call at his earliest convenience.  Follow Up Plan:  Additional outreach attempts will be made to offer the patient care coordination information and services.   Encounter Outcome:  No Answer.  Care Coordination Interventions Activated:  No   Care Coordination Interventions:  No, not indicated.   Nat Christen, BSW, MSW, LCSW  Licensed Education officer, environmental Health System  Mailing [place] [address] [place], [place] [address] Physical Address-[address] [place], [place] [address] Toll Free Main # [phone] Fax # [phone] Cell # [phone] [email or web address]

## 2022-06-21 ENCOUNTER — Telehealth: Payer: Medicare Other

## 2022-06-21 ENCOUNTER — Telehealth: Payer: Self-pay | Admitting: *Deleted

## 2022-06-21 NOTE — Telephone Encounter (Signed)
  Care Management   Follow Up Note   06/21/2022 Name: Terry [name] MRN: [id number] DOB: 03-12-1941   Referred by: Susy Frizzle, MD Reason for referral : Chronic Care Management (CHF, HTN)   A second unsuccessful telephone outreach was attempted today. The patient was referred to the case management team for assistance with care management and care coordination.    Follow Up Plan: Telephone follow up appointment with care management team member scheduled for: upon care guide rescheduling.  [name] RNC, BSN RN Case Manager [facility] [phone]

## 2022-06-30 ENCOUNTER — Telehealth: Payer: Self-pay | Admitting: *Deleted

## 2022-06-30 NOTE — Chronic Care Management (AMB) (Signed)
  Chronic Care Management Note  06/30/2022 Name: Terry [name] MRN: [id number] DOB: 18-Dec-1940  [name] is a 80 y.o. year old male who is a primary care patient of Susy Frizzle, MD and is actively engaged with the care management team. I reached out to [place] by phone today to assist with re-scheduling a follow up visit with the RN Case Manager  Follow up plan: Unsuccessful telephone outreach attempt made. A HIPAA compliant phone message was left for the patient providing contact information and requesting a return call.  Unable to make contact on outreach attempts x 2. PCP Dr Dennard Schaumann notified via routed documentation in medical record.  We have been unable to make contact with the patient for follow up. The care management team is available to follow up with the patient after provider conversation with the patient regarding recommendation for care management engagement and subsequent re-referral to the care management team.   [place]  Direct Dial: [phone]

## 2022-07-01 ENCOUNTER — Ambulatory Visit: Payer: Self-pay | Admitting: *Deleted

## 2022-07-01 DIAGNOSIS — I5032 Chronic diastolic (congestive) heart failure: Secondary | ICD-10-CM

## 2022-07-01 DIAGNOSIS — I10 Essential (primary) hypertension: Secondary | ICD-10-CM

## 2022-07-01 NOTE — Chronic Care Management (AMB) (Signed)
   07/01/2022  [name] 1941-09-18 [id number]    Care Management   Follow Up Note   07/01/2022 Name: Terry [name] MRN: [id number] DOB: Jan 11, 1941   Referred by: Susy Frizzle, MD Reason for referral : Care Coordination (CHF, HTN)   Note from care guide on 06/30/22- unable to maintain contact, RN care manager sent message to pharmacist with update of case closure.  Follow Up Plan: No further follow up required: case closure  [name] [facility], BSN RN Case Manager [facility] [phone]

## 2022-07-13 ENCOUNTER — Ambulatory Visit (INDEPENDENT_AMBULATORY_CARE_PROVIDER_SITE_OTHER): Payer: Medicare Other | Admitting: Family Medicine

## 2022-07-13 VITALS — BP 142/92 | HR 72 | Temp 98.4°F | Ht 67.0 in | Wt 320.0 lb

## 2022-07-13 DIAGNOSIS — C7951 Secondary malignant neoplasm of bone: Secondary | ICD-10-CM

## 2022-07-13 DIAGNOSIS — R29898 Other symptoms and signs involving the musculoskeletal system: Secondary | ICD-10-CM

## 2022-07-13 DIAGNOSIS — N1832 Chronic kidney disease, stage 3b: Secondary | ICD-10-CM | POA: Diagnosis not present

## 2022-07-13 DIAGNOSIS — C61 Malignant neoplasm of prostate: Secondary | ICD-10-CM

## 2022-07-13 DIAGNOSIS — I5032 Chronic diastolic (congestive) heart failure: Secondary | ICD-10-CM | POA: Diagnosis not present

## 2022-07-13 LAB — URINALYSIS, ROUTINE W REFLEX MICROSCOPIC
Bacteria, UA: NONE SEEN /HPF
Bilirubin Urine: NEGATIVE
Glucose, UA: NEGATIVE
Hyaline Cast: NONE SEEN /LPF
Ketones, ur: NEGATIVE
Nitrite: NEGATIVE
RBC / HPF: >=60 /HPF — AB (ref 0–2)
Specific Gravity, Urine: 1.02 (ref 1.001–1.035)
Squamous Epithelial / HPF: NONE SEEN /HPF (ref <=5)
pH: 6 (ref 5.0–8.0)

## 2022-07-13 LAB — MICROSCOPIC MESSAGE

## 2022-07-13 MED ORDER — CEPHALEXIN 500 MG PO CAPS: 500.0000 mg | ORAL_CAPSULE | Freq: Three times a day (TID) | ORAL | 0 refills | Status: DC

## 2022-07-13 NOTE — Progress Notes (Addendum)
Subjective:    Patient ID: Terry [name], male    DOB: 10-01-1941, 81 y.o.   MRN: [id number]  Patient has a history of a retained right ureteral stent right-sided hydronephrosis and chronic kidney disease.  He also has a history of metastatic prostate cancer.  He also has a history of noncompliance with medication therapy.  Apparently someone called him earlier this week stating that he needed to be seen for follow-up and lab work which prompted him to make the appointment.  However upon arrival today he reports feeling extremely weak.  He states that his legs are getting progressively weaker.  He has no strength.  He reports that he has not been taking any of his medication properly.  The only medicine I am sure that he is taking his Eliquis.  He is insistent that he is taking Eliquis.  He also states that he is taking "2 other blood pressure pills" however the remainder of the medications on his list he denies it is not certain about.  Therefore I would assume that every medicine on his list he is not taking.  My best guess is that he is on telmisartan and perhaps hydrochlorothiazide.  He directly denies carvedilol and levothyroxine.  He denies any dysuria but he does report weak stream and increased frequency.  Urinalysis today shows +3 blood, +2 protein, negative nitrates, +3 leukocyte esterase.  He denies any chest pain or shortness of breath however he is essentially only able to walk using a walker and he feels unsteady on his feet.  He drove himself today.  We will put in a home health referral at his last visit in March.  I am not sure what happened with that but apparently he has not been getting physical therapy and he is not taking his medication consistently. Past Medical History:  Diagnosis Date   Arthritis    Cancer ([place])    Phreesia 08/04/2020   CHF, chronic (HCC)    diastolic   CKD (chronic kidney disease) stage 3, GFR 30-59 ml/min (HCC)    Diabetes mellitus without complication (HCC)     Dysrhythmia    Hypertension    Hypothyroidism    Lymphedema    Paroxysmal atrial fibrillation (HCC)    Prostate cancer [facility])    Renal insufficiency    Past Surgical History:  Procedure Laterality Date   COLON SURGERY N/A    Phreesia 08/04/2020   Current Outpatient Medications on File Prior to Visit  Medication Sig Dispense Refill   allopurinol (ZYLOPRIM) 300 MG tablet Take 1 tablet (300 mg total) by mouth daily. 90 tablet 3   apixaban (ELIQUIS) 5 MG TABS tablet Take 1 tablet (5 mg total) by mouth 2 (two) times daily. 180 tablet 1   carvedilol (COREG) 25 MG tablet Take 1 tablet (25 mg total) by mouth 2 (two) times daily with a meal. 180 tablet 1   furosemide (LASIX) 20 MG tablet Take 1 tablet (20 mg total) by mouth daily. 90 tablet 1   hydrochlorothiazide (HYDRODIURIL) 25 MG tablet Take 1 tablet (25 mg total) by mouth daily. 90 tablet 1   levothyroxine (SYNTHROID) 25 MCG tablet Take 1 tablet (25 mcg total) by mouth daily before breakfast. 90 tablet 1   mirabegron ER (MYRBETRIQ) 50 MG TB24 tablet Take 1 tablet (50 mg total) by mouth daily. 90 tablet 1   Multiple Vitamins-Minerals (MULTIVITAMINS THER. W/MINERALS) TABS Take 1 tablet by mouth daily.     polyethylene glycol (MIRALAX / GLYCOLAX)  17 g packet Take 17 g by mouth daily as needed for mild constipation. 14 each 0   senna-docusate (SENOKOT-S) 8.6-50 MG tablet Take 2 tablets by mouth at bedtime as needed for mild constipation. 180 tablet 1   spironolactone (ALDACTONE) 25 MG tablet Take 1 tablet (25 mg total) by mouth daily. 90 tablet 1   tamsulosin (FLOMAX) 0.4 MG CAPS capsule Take 1 capsule (0.4 mg total) by mouth daily. 30 capsule 3   telmisartan (MICARDIS) 40 MG tablet Take 1 tablet (40 mg total) by mouth daily. 90 tablet 1   traMADol (ULTRAM) 50 MG tablet Take 50 mg by mouth every 8 (eight) hours as needed.     No current facility-administered medications on file prior to visit.  Medication list is incorrect.  I left the  medicines on the list to show what he is supposed to be taking however I only am certain that he is taking Eliquis.  I would assume that the remainder of the medications on his list are not being taken. Allergies  Allergen Reactions   Lisinopril     FACIAL SWELLING   Social History   Socioeconomic History   Marital status: Widowed    Spouse name: Not on file   Number of children: 2   Years of education: 17   Highest education level: 12th grade  Occupational History   Not on file  Tobacco Use   Smoking status: Never    Passive exposure: Never   Smokeless tobacco: Never  Vaping Use   Vaping Use: Never used  Substance and Sexual Activity   Alcohol use: Yes    Comment: rarely   Drug use: Yes    Types: Marijuana   Sexual activity: Not Currently  Other Topics Concern   Not on file  Social History Narrative   Lives with son. Daughter lives in [place].   Social Determinants of Health   Financial Resource Strain: Low Risk  (06/09/2022)   Overall Financial Resource Strain (CARDIA)    Difficulty of Paying Living Expenses: Not hard at all  Food Insecurity: No Food Insecurity (06/09/2022)   Hunger Vital Sign    Worried About Running Out of Food in the Last Year: Never true    Ran Out of Food in the Last Year: Never true  Transportation Needs: No Transportation Needs (06/09/2022)   PRAPARE - Hydrologist (Medical): No    Lack of Transportation (Non-Medical): No  Physical Activity: Inactive (06/09/2022)   Exercise Vital Sign    Days of Exercise per Week: 0 days    Minutes of Exercise per Session: 0 min  Stress: No Stress Concern Present (06/09/2022)   [place]    Feeling of Stress : Not at all  Social Connections: Moderately Isolated (06/09/2022)   Social Connection and Isolation Panel [NHANES]    Frequency of Communication with Friends and Family: More than three times a week    Frequency of  Social Gatherings with Friends and Family: More than three times a week    Attends Religious Services: More than 4 times per year    Active Member of [employer] or Organizations: No    Attends Archivist Meetings: Never    Marital Status: Widowed  Intimate Partner Violence: Not At Risk (06/09/2022)   Humiliation, Afraid, Rape, and Kick questionnaire    Fear of Current or Ex-Partner: No    Emotionally Abused: No    Physically  Abused: No    Sexually Abused: No    Past Medical History:  Diagnosis Date   Arthritis    Cancer ([place])    Phreesia 08/04/2020   CHF, chronic (HCC)    diastolic   CKD (chronic kidney disease) stage 3, GFR 30-59 ml/min (HCC)    Diabetes mellitus without complication (HCC)    Dysrhythmia    Hypertension    Hypothyroidism    Lymphedema    Paroxysmal atrial fibrillation (HCC)    Prostate cancer ([place])    Renal insufficiency       Review of Systems  Genitourinary:  Positive for dysuria.       Objective:   Physical Exam Vitals reviewed.  Constitutional:      General: He is not in acute distress.    Appearance: He is obese. He is not ill-appearing, toxic-appearing or diaphoretic.  Cardiovascular:     Rate and Rhythm: Normal rate. Rhythm irregular.     Heart sounds: Normal heart sounds.  Pulmonary:     Effort: Pulmonary effort is normal. No respiratory distress.     Breath sounds: No wheezing or rales.  Abdominal:     General: Abdomen is flat. Bowel sounds are normal.     Palpations: Abdomen is soft.  Musculoskeletal:     Right lower leg: Edema present.     Left lower leg: Edema present.  Neurological:     Mental Status: He is alert.    Sitting in a wheelchair.  Reports weakness and difficulty standing and walking.    Assessment & Plan:   Weakness of both lower extremities - Plan: Urinalysis, Routine w reflex microscopic, CBC with Differential/Platelet, COMPLETE METABOLIC PANEL WITH GFR, TSH, Urine Culture  Chronic diastolic congestive  heart failure (HCC)  Stage 3b chronic kidney disease (HCC)  Prostate cancer metastatic to bone (HCC)  Urinalysis today is certainly abnormal.  The last time the urinalysis appeared like this he had a urinary tract infection however he is also had contamination previously.  Therefore I will check a urine culture.  I will start the patient on Keflex 500 mg p.o. 3 times daily for 10 days to cover for possible prostatitis given his complicated history and his increasing weakness.  Await the results of his urine culture.  However I feel that his weakness is more likely due to deconditioning and noncompliance with medical therapy.  Patient has chronic diastolic congestive heart failure and not taking his diuretic properly likely are contributing to his weakness and especially his edema.  This has been a recurrent problem for this patient in the past.  Today his lungs are clear and I do not appreciate any pulmonary edema.  I will obtain lab work today to evaluate for any significant anemia, renal failure, electrolyte disturbances, or thyroid abnormalities that may contribute to his weakness.  I asked the patient to go home and call us back and tell us exactly what medication he is taking so that we can adjust his medications appropriately based on his lab results when they return.  Based on his noncompliance, the patient is at high risk for medical complications and even death.  He would benefit from being in the nursing home where he could be supervised more closely but he has declined this and he is capable of making medical decisions.  I will consult home health nursing to try to help with medication supervision to avoid exacerbations and his chronic diastolic heart failure and to arrange physical therapy to help with  the weakness in both of his legs

## 2022-07-14 LAB — COMPLETE METABOLIC PANEL WITH GFR
AG Ratio: 0.8 (calc) — ABNORMAL LOW (ref 1.0–2.5)
ALT: 7 U/L — ABNORMAL LOW (ref 9–46)
AST: 13 U/L (ref 10–35)
Albumin: 3.5 g/dL — ABNORMAL LOW (ref 3.6–5.1)
Alkaline phosphatase (APISO): 62 U/L (ref 35–144)
BUN/Creatinine Ratio: 16 (calc) (ref 6–22)
BUN: 29 mg/dL — ABNORMAL HIGH (ref 7–25)
CO2: 24 mmol/L (ref 20–32)
Calcium: 9.5 mg/dL (ref 8.6–10.3)
Chloride: 108 mmol/L (ref 98–110)
Creat: 1.86 mg/dL — ABNORMAL HIGH (ref 0.70–1.22)
Globulin: 4.3 g/dL (calc) — ABNORMAL HIGH (ref 1.9–3.7)
Glucose, Bld: 85 mg/dL (ref 65–99)
Potassium: 4.3 mmol/L (ref 3.5–5.3)
Sodium: 141 mmol/L (ref 135–146)
Total Bilirubin: 0.3 mg/dL (ref 0.2–1.2)
Total Protein: 7.8 g/dL (ref 6.1–8.1)
eGFR: 36 mL/min/{1.73_m2} — ABNORMAL LOW (ref >=60)

## 2022-07-14 LAB — CBC WITH DIFFERENTIAL/PLATELET
Absolute Monocytes: 732 cells/uL (ref 200–950)
Basophils Absolute: 31 cells/uL (ref 0–200)
Basophils Relative: 0.5 %
Eosinophils Absolute: 220 cells/uL (ref 15–500)
Eosinophils Relative: 3.6 %
HCT: 35 % — ABNORMAL LOW (ref 38.5–50.0)
Hemoglobin: 10.6 g/dL — ABNORMAL LOW (ref 13.2–17.1)
Lymphs Abs: 793 cells/uL — ABNORMAL LOW (ref 850–3900)
MCH: 23.9 pg — ABNORMAL LOW (ref 27.0–33.0)
MCHC: 30.3 g/dL — ABNORMAL LOW (ref 32.0–36.0)
MCV: 78.8 fL — ABNORMAL LOW (ref 80.0–100.0)
MPV: 10.5 fL (ref 7.5–12.5)
Monocytes Relative: 12 %
Neutro Abs: 4325 cells/uL (ref 1500–7800)
Neutrophils Relative %: 70.9 %
Platelets: 249 10*3/uL (ref 140–400)
RBC: 4.44 10*6/uL (ref 4.20–5.80)
RDW: 15.4 % — ABNORMAL HIGH (ref 11.0–15.0)
Total Lymphocyte: 13 %
WBC: 6.1 10*3/uL (ref 3.8–10.8)

## 2022-07-14 LAB — TSH: TSH: 4.66 mIU/L — ABNORMAL HIGH (ref 0.40–4.50)

## 2022-07-15 ENCOUNTER — Telehealth: Payer: Self-pay

## 2022-07-15 LAB — URINE CULTURE
MICRO NUMBER:: 13846567
SPECIMEN QUALITY:: ADEQUATE

## 2022-07-15 NOTE — Telephone Encounter (Signed)
Pt called this afternoon, stating that per your request during last OV,07/13/22, you wanted to know what meds he was still taking at home. Per pt these are the only 3 med he has and have been taking:  Carvedilo Eliquis Telmisartan  Before I refill them, I wanted to check if that was ok, since meds are expired (9/22).   Pls advice?

## 2022-07-16 ENCOUNTER — Other Ambulatory Visit: Payer: Self-pay

## 2022-07-16 DIAGNOSIS — I10 Essential (primary) hypertension: Secondary | ICD-10-CM

## 2022-07-16 DIAGNOSIS — N1832 Chronic kidney disease, stage 3b: Secondary | ICD-10-CM

## 2022-07-16 DIAGNOSIS — I5032 Chronic diastolic (congestive) heart failure: Secondary | ICD-10-CM

## 2022-07-16 DIAGNOSIS — E039 Hypothyroidism, unspecified: Secondary | ICD-10-CM

## 2022-07-16 DIAGNOSIS — I480 Paroxysmal atrial fibrillation: Secondary | ICD-10-CM

## 2022-07-16 DIAGNOSIS — D500 Iron deficiency anemia secondary to blood loss (chronic): Secondary | ICD-10-CM

## 2022-07-16 DIAGNOSIS — I509 Heart failure, unspecified: Secondary | ICD-10-CM

## 2022-07-16 MED ORDER — IRON (FERROUS SULFATE) 325 (65 FE) MG PO TABS: 325.0000 mg | ORAL_TABLET | Freq: Every day | ORAL | 3 refills | Status: DC

## 2022-07-16 MED ORDER — LEVOTHYROXINE SODIUM 25 MCG PO TABS: 25.0000 ug | ORAL_TABLET | Freq: Every day | ORAL | 1 refills | Status: DC

## 2022-07-16 MED ORDER — SPIRONOLACTONE 25 MG PO TABS: 25.0000 mg | ORAL_TABLET | Freq: Every day | ORAL | 1 refills | Status: DC

## 2022-07-16 MED ORDER — APIXABAN 5 MG PO TABS: 5.0000 mg | ORAL_TABLET | Freq: Two times a day (BID) | ORAL | 1 refills | Status: DC

## 2022-07-16 MED ORDER — TELMISARTAN 40 MG PO TABS: 40.0000 mg | ORAL_TABLET | Freq: Every day | ORAL | 1 refills | Status: DC

## 2022-07-16 NOTE — Telephone Encounter (Signed)
LVM for pt to return call for lab results/medication refills.

## 2022-07-16 NOTE — Progress Notes (Signed)
Chronic Care Management Pharmacy Note  07/22/2022 Name:  Terry [name] MRN:  [id number] DOB:  1940-12-05  Summary: Initial visit with Pharmd.  Patient is really confused on his medications.  He does not have a lot of his med list on hand but reports he is going to pick up a lot today.  He is having low energy and pain that he attributes to his gout.    Recommendations/Changes made from today's visit: Eliquis dose decrease to 2.7m BID Allopurinol 511mdaily x 2 weeks then titrate to 10012maily based on renal function  Plan: Will have patient RTC in 2 weeks with meds to review adherence   Subjective: BenDonavin [name] an 81 47o. year old male who is a primary patient of Pickard, WarCammie McgeeD.  The CCM team was consulted for assistance with disease management and care coordination needs.    Engaged with patient by telephone for initial visit in response to provider referral for pharmacy case management and/or care coordination services.   Consent to Services:  The patient was given the following information about Chronic Care Management services today, agreed to services, and gave verbal consent: 1. CCM service includes personalized support from designated clinical staff supervised by the primary care provider, including individualized plan of care and coordination with other care providers 2. 24/7 contact phone numbers for assistance for urgent and routine care needs. 3. Service will only be billed when office clinical staff spend 20 minutes or more in a month to coordinate care. 4. Only one practitioner may furnish and bill the service in a calendar month. 5.The patient may stop CCM services at any time (effective at the end of the month) by phone call to the office staff. 6. The patient will be responsible for cost sharing (co-pay) of up to 20% of the service fee (after annual deductible is met). Patient agreed to services and consent obtained.  Patient Care Team: PicSusy FrizzleD as  PCP - General (Family Medicine) GolRockey SitumKathlene NovemberD as Consulting Physician (Cardiology) [facility] Pharmacist (Pharmacist)  Recent office visits:  07/13/22 WarJenna LuoD - Family Medicine - Weakness both lower extremities - Labs were ordered. cephALEXin (KEFLEX) 500 MG capsule prescribed. Follow up as needed.   02/02/22 WarJenna LuoD - Family Medicine - Overactive bladder - Referral to Home health. Tramadol (ULTRAM) 50 MG tablet prescribed.    01/26/22 WarJenna LuoD - Family Medicine - Dysuria - Labs were ordered. CephALEXin (KEFLEX) 500 MG capsule prescribed. Follow up as needed.    Recent consult visits:  05/25/22 TimIda Rogue - Cardiology - CHF - EKG performed. No medication changes. Follow up in  12 months as scheduled.    Hospital visits:  None in previous 6 months   Objective:  Lab Results  Component Value Date   CREATININE 1.86 (H) 07/13/2022   BUN 29 (H) 07/13/2022   EGFR 36 (L) 07/13/2022   GFRNONAA 51 (L) 01/08/2021   GFRAA 59 (L) 01/08/2021   NA 141 07/13/2022   K 4.3 07/13/2022   CALCIUM 9.5 07/13/2022   CO2 24 07/13/2022   GLUCOSE 85 07/13/2022    Lab Results  Component Value Date/Time   HGBA1C 6.0 (H) 08/27/2021 04:48 PM   HGBA1C 5.8 (H) 01/08/2021 02:22 PM    Last diabetic Eye exam: No results found for: "HMDIABEYEEXA"  Last diabetic Foot exam: No results found for: "HMDIABFOOTEX"   Lab Results  Component Value Date  CHOL 112 06/25/2020   HDL 35 (L) 06/25/2020   LDLCALC 63 06/25/2020   TRIG 65 06/25/2020   CHOLHDL 3.2 06/25/2020       Latest Ref Rng & Units 07/13/2022    1:12 PM 01/26/2022   11:34 AM 08/27/2021    4:48 PM  Hepatic Function  Total Protein 6.1 - 8.1 g/dL 7.8  7.9  7.9   AST 10 - 35 U/L '13  12  12   ' ALT 9 - 46 U/L '7  7  8   ' Total Bilirubin 0.2 - 1.2 mg/dL 0.3  0.3  0.2     Lab Results  Component Value Date/Time   TSH 4.66 (H) 07/13/2022 01:12 PM   TSH 4.04 08/27/2021 04:48 PM   FREET4 1.2  06/25/2020 11:44 AM       Latest Ref Rng & Units 07/13/2022    1:12 PM 01/26/2022   11:34 AM 08/27/2021    4:48 PM  CBC  WBC 3.8 - 10.8 Thousand/uL 6.1  6.9  7.5   Hemoglobin 13.2 - 17.1 g/dL 10.6  11.4  12.7   Hematocrit 38.5 - 50.0 % 35.0  36.7  40.4   Platelets 140 - 400 Thousand/uL 249  210  208     No results found for: "VD25OH"  Clinical ASCVD: Yes  The ASCVD Risk score (Arnett DK, et al., 2019) failed to calculate for the following reasons:   The 2019 ASCVD risk score is only valid for ages 19 to 28       06/09/2022   12:29 PM 01/22/2022   10:13 AM 09/28/2021   12:18 PM  Depression screen PHQ 2/9  Decreased Interest 0 0 0  Down, Depressed, Hopeless 0 1  0  PHQ - 2 Score 0 1 0     Significant value       Social History   Tobacco Use  Smoking Status Never   Passive exposure: Never  Smokeless Tobacco Never   BP Readings from Last 3 Encounters:  07/13/22 (!) 142/92  05/25/22 130/90  02/02/22 130/90   Pulse Readings from Last 3 Encounters:  07/13/22 72  05/25/22 64  02/02/22 72   Wt Readings from Last 3 Encounters:  07/13/22 (!) 320 lb (145.2 kg)  05/25/22 (!) 323 lb (146.5 kg)  02/02/22 (!) 317 lb (143.8 kg)   BMI Readings from Last 3 Encounters:  07/13/22 50.12 kg/m  05/25/22 50.59 kg/m  02/02/22 49.65 kg/m    Assessment/Interventions: Review of patient past medical history, allergies, medications, health status, including review of consultants reports, laboratory and other test data, was performed as part of comprehensive evaluation and provision of chronic care management services.   SDOH:  (Social Determinants of Health) assessments and interventions performed: No, just performed Financial Resource Strain: Low Risk  (06/09/2022)   Overall Financial Resource Strain (CARDIA)    Difficulty of Paying Living Expenses: Not hard at all   Food Insecurity: No Food Insecurity (06/09/2022)   Hunger Vital Sign    Worried About Running Out of Food in  the Last Year: Never true    Ran Out of Food in the Last Year: Never true    SDOH Interventions    [place] Coordination from 06/09/2022 in [place] from 01/22/2022 in [place] Management from 09/28/2021 in [place] Interventions     Food Insecurity Interventions Intervention Not Indicated Intervention Not Indicated Intervention  Not Indicated  Housing Interventions Intervention Not Indicated Intervention Not Indicated --  Transportation Interventions Intervention Not Indicated Intervention Not Indicated Intervention Not Indicated  Financial Strain Interventions Intervention Not Indicated Intervention Not Indicated --  Physical Activity Interventions Patient Refused Other (Comments)  [Encoraged pt to try chair exercises and stretching.] --  Stress Interventions Intervention Not Indicated Intervention Not Indicated --  Social Connections Interventions Intervention Not Indicated Intervention Not Indicated --      SDOH Screenings   Food Insecurity: No Food Insecurity (06/09/2022)  Housing: Low Risk  (06/09/2022)  Transportation Needs: No Transportation Needs (06/09/2022)  Alcohol Screen: Low Risk  (06/09/2022)  Depression (PHQ2-9): Low Risk  (06/09/2022)  Financial Resource Strain: Low Risk  (06/09/2022)  Physical Activity: Inactive (06/09/2022)  Social Connections: Moderately Isolated (06/09/2022)  Stress: No Stress Concern Present (06/09/2022)  Tobacco Use: Low Risk  (06/09/2022)    CCM Care Plan  Allergies  Allergen Reactions   Lisinopril     FACIAL SWELLING    Medications Reviewed Today     Reviewed by Chriss Driver, LPN (Licensed Practical Nurse) on 07/13/22 at 1256  Med List Status: <None>   Medication Order Taking? Sig Documenting Provider Last Dose Status Informant  allopurinol (ZYLOPRIM) 300 MG tablet [id number] Yes Take 1 tablet (300 mg total)  by mouth daily. Susy Frizzle, MD Taking Active            Med Note Langston Masker, Doyle Askew   Tue Jul 13, 2022 12:55 PM) Currently out of medication.   apixaban (ELIQUIS) 5 MG TABS tablet [id number] Yes Take 1 tablet (5 mg total) by mouth 2 (two) times daily. Susy Frizzle, MD Taking Active   carvedilol (COREG) 25 MG tablet [id number] No Take 1 tablet (25 mg total) by mouth 2 (two) times daily with a meal.  Patient not taking: Reported on 07/13/2022   Susy Frizzle, MD Not Taking Active            Med Note Langston Masker, Doyle Askew   Tue Jul 13, 2022 12:55 PM) Currently out of medication.  furosemide (LASIX) 20 MG tablet [id number] Yes Take 1 tablet (20 mg total) by mouth daily. Susy Frizzle, MD Taking Active   hydrochlorothiazide (HYDRODIURIL) 25 MG tablet [id number] Yes Take 1 tablet (25 mg total) by mouth daily. Susy Frizzle, MD Taking Active   levothyroxine (SYNTHROID) 25 MCG tablet [id number] Yes Take 1 tablet (25 mcg total) by mouth daily before breakfast. Susy Frizzle, MD Taking Active   mirabegron ER (MYRBETRIQ) 50 MG TB24 tablet [id number] Yes Take 1 tablet (50 mg total) by mouth daily. Susy Frizzle, MD Taking Active   Multiple Vitamins-Minerals (MULTIVITAMINS THER. W/MINERALS) Sheral Flow [id number] Yes Take 1 tablet by mouth daily. [provider] Taking Active Self  polyethylene glycol (MIRALAX / GLYCOLAX) 17 g packet [id number] No Take 17 g by mouth daily as needed for mild constipation.  Patient not taking: Reported on 07/13/2022   Jonetta Osgood, MD Not Taking Active   senna-docusate (SENOKOT-S) 8.6-50 MG tablet [id number] No Take 2 tablets by mouth at bedtime as needed for mild constipation.  Patient not taking: Reported on 07/13/2022   Susy Frizzle, MD Not Taking Active   spironolactone (ALDACTONE) 25 MG tablet [id number] Yes Take 1 tablet (25 mg total) by mouth daily. Susy Frizzle, MD Taking Active   tamsulosin [facility]) 0.4 MG CAPS capsule [id number]  Yes Take 1 capsule (0.4 mg total) by mouth daily.  Susy Frizzle, MD Taking Active   telmisartan (MICARDIS) 40 MG tablet [id number] Yes Take 1 tablet (40 mg total) by mouth daily. Susy Frizzle, MD Taking Active   traMADol Veatrice Bourbon) 50 MG tablet [id number] Yes Take 50 mg by mouth every 8 (eight) hours as needed. [provider] Taking Active             Patient Active Problem List   Diagnosis Date Noted   Asymptomatic microscopic hematuria 01/22/2022   Benign prostatic hyperplasia with lower urinary tract symptoms 01/22/2022   Nocturia 01/22/2022   Retained ureteral stent 01/22/2022   Goals of care, counseling/discussion 09/04/2021   Sepsis ([place]) 01/00/7121   Complicated UTI (urinary tract infection) 10/24/2020   Chronic heart failure with preserved ejection fraction (HFpEF) ([place]) 10/24/2020   CHF, chronic (HCC)    Prostate cancer ([place])    Congestive heart failure ([place]) 07/02/2020   Macrocytic anemia 06/30/2020   Elevated PSA 06/30/2020   Cellulitis of lower extremity 06/30/2020   Prediabetes 01/04/2019   Paroxysmal atrial fibrillation ([place]) 01/01/2019   Risk for falls 12/27/2017   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[place]) 10/18/2017   Right ureteral stone 10/01/2017   Iron deficiency anemia due to chronic blood loss 06/22/2016   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   Depression with anxiety 06/19/2015   CKD (chronic kidney disease) stage 3, GFR 30-59 ml/min ([place]) 05/26/2015   Essential hypertension 11/01/2014   Gouty arthritis of toe 11/01/2014   Morbid obesity due to excess calories ([place]) 11/01/2014   Primary osteoarthritis of both knees 11/01/2014    Immunization History  Administered Date(s) Administered   Fluad Quad(high Dose 65+) 10/26/2020, 08/27/2021   Influenza Split 10/17/2012, 10/03/2017   PFIZER(Purple Top)SARS-COV-2 Vaccination 10/27/2020   Pneumococcal Polysaccharide-23 10/17/2012, 10/26/2020    Conditions to  be addressed/monitored:  CHF, Afib, HTN, Hypothyroidism, Depression  There are no care plans that you recently modified to display for this patient.    Medication Assistance: None required.  Patient affirms current coverage meets needs.  Compliance/Adherence/Medication fill history: Care Gaps: None  Star-Rating Drugs: Telmisartan 15m 07/16/22 90ds  Patient's preferred pharmacy is:  [facility] [place] - [address] [place] [id number]: [phone]: [phone] Uses pill box? No - he does not have a way to organize Pt endorses 50% compliance  We discussed: Current pharmacy is preferred with insurance plan and patient is satisfied with pharmacy services Patient decided to: Continue current medication management strategy  Care Plan and Follow Up Patient Decision:  Patient agrees to Care Plan and Follow-up.  Plan: The care management team will reach out to the patient again over the next 14 days.  CBeverly Milch PharmD, CPP Clinical Pharmacist Practitioner [phone] Current Barriers:  Medication adherence Low energy and pain  Pharmacist Clinical Goal(s):  Patient will achieve improvement in medication adherence as evidenced by pill box usage  through collaboration with PharmD and provider.   Interventions: 1:1 collaboration with PSusy Frizzle MD regarding development and update of comprehensive plan of care as evidenced by provider attestation and co-signature Inter-disciplinary care team collaboration (see longitudinal plan of care) Comprehensive medication review performed; medication list updated in electronic medical record  Hypertension (BP goal <130/80) -Uncontrolled, based on last office BP -Current treatment: Telmisartan 482mAppropriate, Query effective, ,  Carvedilol 2526mID Appropriate, Effective, Safe, Accessible Spironolactone  47m31mpropriate, Query  effective, , -Medications previously tried: none noted  -Current home readings: not checking, no home readings discussed today -Current dietary habits: tries to limit salt, cooks at home some -Current exercise habits: minimal, due to pain from gout and SOB -Denies hypotensive/hypertensive symptoms -Educated on BP goals and benefits of medications for prevention of heart attack, stroke and kidney damage; Daily salt intake goal < 2300 mg; Exercise goal of 150 minutes per week; Importance of home blood pressure monitoring; -Counseled to monitor BP at home a few times per week, document, and provide log at future appointments - He was going to pick up new medications today at the pharmacy.  Considerable time spent explaining the indication of each med and why it is important.  He plans to get back on track with his meds and will have him come in 2 weeks in person to assess compliance and set up a pill box for him.  Atrial Fibrillation (Goal: prevent stroke and major bleeding) -Controlled -CHADSVASC: 5 -Current treatment: Rate control: Carvedilol 26m BID Appropriate, Effective, Safe, Accessible  Anticoagulation: Eliquis 554mAppropriate, Effective, Query Safe,  -Medications previously tried: none noted -Home BP and HR readings: slightly elevated in office  -Counseled on increased risk of stroke due to Afib and benefits of anticoagulation for stroke prevention; importance of adherence to anticoagulant exactly as prescribed; bleeding risk associated with Eliquis and importance of self-monitoring for signs/symptoms of bleeding; -Counseled on diet and exercise extensively Collaborated with PCP, recommend dose decrease of Eliquis based on patient age and most recent Scr of 1.86.  Would recommend 2.82m30mID.  This could also help with his Hgb of 10.6 most recently.  Consult with PCP and call in new rx.  Heart Failure (Goal: manage symptoms and prevent  exacerbations) -Controlled -Last ejection fraction: 60-65% -Current treatment: Carvedilol 282m79mD Appropriate, Effective, Safe, Accessible Spironolactone 282mg40mropriate, Effective, Safe, Accessible -Medications previously tried: none noted  -Current home BP/HR readings: see HTn -Current dietary habits: avoids salt -Current exercise habits: minimal -Educated on Benefits of medications for managing symptoms and prolonging life Importance of weighing daily; if you gain more than 3 pounds in one day or 5 pounds in one week, contact providers Importance of blood pressure control -Recommended to continue current medication Assessed patient finances. He does have a few expensive meds, but he can afford them for now.  Would consider FarxiWilder GladeHF/renal protection on the future.   Hypothyroidism (Goal: Maintain TSH) -Not ideally controlled -Current treatment  Levothyroxine 282mcg31mly Appropriate, Query effective, -Medications previously tried: none noted -He had been non compliant with levothyroxine in the past due to not having on hand.  -Recommended he pick up new medication called in last week.  Start taking once daily.  Recommend recheck TSH in a few weeks to assess.  Patient Goals/Self-Care Activities Patient will:  - take medications as prescribed as evidenced by patient report and record review weigh daily, and contact provider if weight gain of 3 lbs in a day or 5 lbs in a week collaborate with provider on medication access solutions  Follow Up Plan: The care management team will reach out to the patient again over the next 14 days.

## 2022-07-21 ENCOUNTER — Telehealth: Payer: Self-pay | Admitting: Pharmacist

## 2022-07-21 NOTE — Progress Notes (Signed)
Chronic Care Management Pharmacy Assistant   Name: Terry [name]  MRN: [id number] DOB: Apr 08, 1941  Terry [name] is an 81 y.o. year old male who presents for his initial CCM visit with the clinical pharmacist.  Reason for Encounter: Chart Prep for Initial Visit    Recent office visits:  07/13/22 Jenna Luo, MD - Family Medicine - Weakness both lower extremities - Labs were ordered. cephALEXin (KEFLEX) 500 MG capsule prescribed. Follow up as needed.  02/02/22 Jenna Luo, MD - Family Medicine - Overactive bladder - Referral to Home health. Tramadol (ULTRAM) 50 MG tablet prescribed.   01/26/22 Jenna Luo, MD - Family Medicine - Dysuria - Labs were ordered. CephALEXin (KEFLEX) 500 MG capsule prescribed. Follow up as needed.   Recent consult visits:  05/25/22 Ida Rogue MD - Cardiology - CHF - EKG performed. No medication changes. Follow up in  12 months as scheduled.   Hospital visits:  None in previous 6 months  Medications: Outpatient Encounter Medications as of 07/21/2022  Medication Sig Note   allopurinol (ZYLOPRIM) 300 MG tablet Take 1 tablet (300 mg total) by mouth daily. 07/13/2022: Currently out of medication.    apixaban (ELIQUIS) 5 MG TABS tablet Take 1 tablet (5 mg total) by mouth 2 (two) times daily.    carvedilol (COREG) 25 MG tablet Take 1 tablet (25 mg total) by mouth 2 (two) times daily with a meal. (Patient not taking: Reported on 07/13/2022) 07/13/2022: Currently out of medication.   cephALEXin (KEFLEX) 500 MG capsule Take 1 capsule (500 mg total) by mouth 3 (three) times daily.    Iron, Ferrous Sulfate, 325 (65 Fe) MG TABS Take 325 mg by mouth daily.    levothyroxine (SYNTHROID) 25 MCG tablet Take 1 tablet (25 mcg total) by mouth daily before breakfast.    mirabegron ER (MYRBETRIQ) 50 MG TB24 tablet Take 1 tablet (50 mg total) by mouth daily. (Patient not taking: Reported on 07/13/2022)    Multiple Vitamins-Minerals (MULTIVITAMINS THER. W/MINERALS) TABS Take  1 tablet by mouth daily.    polyethylene glycol (MIRALAX / GLYCOLAX) 17 g packet Take 17 g by mouth daily as needed for mild constipation. (Patient not taking: Reported on 07/13/2022)    senna-docusate (SENOKOT-S) 8.6-50 MG tablet Take 2 tablets by mouth at bedtime as needed for mild constipation. (Patient not taking: Reported on 07/13/2022)    spironolactone (ALDACTONE) 25 MG tablet Take 1 tablet (25 mg total) by mouth daily.    tamsulosin (FLOMAX) 0.4 MG CAPS capsule Take 1 capsule (0.4 mg total) by mouth daily.    telmisartan (MICARDIS) 40 MG tablet Take 1 tablet (40 mg total) by mouth daily.    traMADol (ULTRAM) 50 MG tablet Take 50 mg by mouth every 8 (eight) hours as needed.    No facility-administered encounter medications on file as of 07/21/2022.     Have you seen any other providers since your last visit? **  Any changes in your medications or health?   Any side effects from any medications?   Do you have an symptoms or problems not managed by your medications?   Any concerns about your health right now?   Has your provider asked that you check blood pressure, blood sugar, or follow special diet at home?   Do you get any type of exercise on a regular basis?   Can you think of a goal you would like to reach for your health?   Do you have any problems getting your medications?  Is there anything that you would like to discuss during the appointment?   Please bring medications and supplements to appointment.   Care Gaps  AWV: done 01/22/22 Colonoscopy: aged out  DM Eye Exam: N/A DM Foot Exam: N/A Microalbumin: N/A HbgAIC: 08/27/21 (6.0) DEXA: N/A Mammogram: N/A   Star Rating Drugs: Telmisartan (MICARDIS) 40 MG tablet - last filled 05/08/22 90 days    Future Appointments  Date Time Provider [place]  07/22/2022 10:00 AM [facility]-CCM PHARMACIST [facility]  01/28/2023  9:45 AM [facility]-NURSE HEALTH ADVISOR [facility]   Multiple attempts were made to contact  patient. Attempts were unsuccessful. / ls,CMA    Jobe Gibbon, [place] Pharmacist Assistant  [phone]

## 2022-07-22 ENCOUNTER — Telehealth: Payer: Self-pay

## 2022-07-22 ENCOUNTER — Ambulatory Visit: Payer: Medicare Other | Admitting: Pharmacist

## 2022-07-22 ENCOUNTER — Telehealth: Payer: Self-pay | Admitting: Pharmacist

## 2022-07-22 ENCOUNTER — Other Ambulatory Visit: Payer: Self-pay

## 2022-07-22 DIAGNOSIS — I10 Essential (primary) hypertension: Secondary | ICD-10-CM

## 2022-07-22 DIAGNOSIS — I509 Heart failure, unspecified: Secondary | ICD-10-CM

## 2022-07-22 DIAGNOSIS — I480 Paroxysmal atrial fibrillation: Secondary | ICD-10-CM

## 2022-07-22 DIAGNOSIS — N3281 Overactive bladder: Secondary | ICD-10-CM

## 2022-07-22 DIAGNOSIS — M109 Gout, unspecified: Secondary | ICD-10-CM

## 2022-07-22 DIAGNOSIS — N1832 Chronic kidney disease, stage 3b: Secondary | ICD-10-CM

## 2022-07-22 MED ORDER — MIRABEGRON ER 50 MG PO TB24: 50.0000 mg | ORAL_TABLET | Freq: Every day | ORAL | 1 refills | Status: DC

## 2022-07-22 MED ORDER — ALLOPURINOL 100 MG PO TABS: 50.0000 mg | ORAL_TABLET | Freq: Every day | ORAL | 6 refills | Status: DC

## 2022-07-22 MED ORDER — APIXABAN 2.5 MG PO TABS: 2.5000 mg | ORAL_TABLET | Freq: Two times a day (BID) | ORAL | 11 refills | Status: DC

## 2022-07-22 NOTE — Addendum Note (Signed)
Addended by: Edythe Clarity on: 07/22/2022 02:30 PM   Modules accepted: Orders

## 2022-07-22 NOTE — Chronic Care Management (AMB) (Signed)
Error

## 2022-07-22 NOTE — Progress Notes (Signed)
Per Dr. Dennard Schaumann approval.  Will change Eliquis to 2.'5mg'$  BID and call in Allopurinol '50mg'$  once daily for gout.  Patient to FU with me in two weeks in office for complete medication review.  Beverly Milch, PharmD, CPP Clinical Pharmacist Practitioner [place] [phone]

## 2022-07-22 NOTE — Telephone Encounter (Signed)
Per Edythe Clarity, [facility] sent to P [facility] Rx Refill Pool Patient requesting rx sent to [facility] for Myrbetriq for him to pick up with other new meds.   Rx sent to pt's pharmacy/BT-CMA

## 2022-07-22 NOTE — Patient Instructions (Addendum)
Visit Information   Goals Addressed             This Visit's Progress    Manage My Medicine       Timeframe:  Long-Range Goal Priority:  High Start Date:  07/22/22                           Expected End Date: 01/20/23                      Follow Up Date 08/05/22    Work together to develop a plan to keep you on track with meds.  Bring in ALL meds to next in office visit!    Why is this important?   These steps will help you keep on track with your medicines.   Notes:        Patient Care Plan: RN Care Manager plan of care  Completed 07/01/2022   Problem Identified: No plan of care established for management of chronic disease states (CHF, HTN, cancer) Resolved 07/01/2022  Priority: High     Long-Range Goal: Development of plan of care for chronic disease management (CHF, HTN, cancer) Completed 07/01/2022  Start Date: 09/28/2021  Expected End Date: 09/11/2022  Priority: High  Note:   Unable to maintain contact with pt, care plan updated and resolved Current Barriers:  Knowledge Deficits related to plan of care for management of CHF and HTN  Chronic Disease Management support and education needs related to CHF and HTN Patient reports his adult son lives with him and provides assistance if needed, has adult daughter [name] [facility]), patient reports he still drives, is overall independent with aspects of his care, requires assistance getting up steps, uses shower seat for bathing.  Patient reports he has prostate cancer and with last scan/ tests, etc reports " things are about the same, stabilized for now", sees oncologist Dr. Randa Evens, had CT scan 09/30/21. Patient reports he has a scale but does not weigh often, last weight one week ago 317 pounds at primary care provider office, has blood pressure cuff and checks blood pressure " on occasion"  Patient reports he does not adhere to a special diet but tries " to eat as healthy as I can"  Patient reports he has no further burning  with urination and is finished with antibiotics Patient had a fall few weeks ago, denies any injury  RNCM Clinical Goal(s):  Patient will verbalize understanding of plan for management of CHF and HTN as evidenced by patient report, EHR review take all medications exactly as prescribed and will call provider for medication related questions as evidenced by patient report, EHR review, collaboration with CCM pharmacist    attend all scheduled medical appointments: (no upcoming scheduled appointments) as evidenced by patient report, EHR review and        through collaboration with RN Care manager, provider, and care team.   Interventions: 1:1 collaboration with primary care provider regarding development and update of comprehensive plan of care as evidenced by provider attestation and co-signature Inter-disciplinary care team collaboration (see longitudinal plan of care) Evaluation of current treatment plan related to  self management and patient's adherence to plan as established by provider  Hypertension Interventions: Last practice recorded BP readings:  BP Readings from Last 3 Encounters:  09/23/21 (!) 142/71  08/28/21 (!) 145/71  08/27/21 (!) 160/68  Most recent eGFR/CrCl:  Lab Results  Component Value Date   EGFR  43 (L) 08/27/2021    No components found for: CRCL  Evaluation of current treatment plan related to hypertension self management and patient's adherence to plan as established by provider Reviewed medications with patient and discussed importance of compliance Discussed complications of poorly controlled blood pressure such as heart disease, stroke, circulatory complications, vision complications, kidney impairment, sexual dysfunction Reinforced low sodium diet, importance of reading food labels Reviewed good handwashing and wearing a mask as needed, avoid sick people Pain assessment completed Encouraged patient to drink adequate fluids, preferably water Reviewed safety  precautions  Heart Failure Interventions: Discussed importance of daily weight and advised patient to weigh and record daily Discussed the importance of keeping all appointments with provider Reviewed heart failure action plan, importance of calling doctor early for change in healthy status, symptoms  Reviewed signs/ symptoms urinary tract infection  Patient Goals/Self-Care Activities: Take medications as prescribed   Attend all scheduled provider appointments Perform all self care activities independently  Perform IADL's (shopping, preparing meals, housekeeping, managing finances) independently Call provider office for new concerns or questions  call office if I gain more than 2 pounds in one day or 5 pounds in one week do ankle pumps when sitting keep legs up while sitting track weight in diary watch for swelling in feet, ankles and legs every day weigh myself daily follow rescue plan if symptoms flare-up track symptoms and what helps feel better or worse check blood pressure weekly write blood pressure results in a log or diary take blood pressure log to all doctor appointments take medications for blood pressure exactly as prescribed report new symptoms to your doctor eat more whole grains, fruits and vegetables, lean meats and healthy fats Practice good handwashing, wear a mask as needed, avoid sick people Call RN care manager if you have any questions at [phone] fall prevention strategies: change position slowly, use assistive device such as walker or cane (per provider recommendations) when walking, keep walkways clear, have good lighting in room. It is important to contact your provider if you have any falls, maintain muscle strength/tone by exercise per provider recommendations.        Mr. Youtsey was given information about Chronic Care Management services today including:  CCM service includes personalized support from designated clinical staff supervised by his  physician, including individualized plan of care and coordination with other care providers 24/7 contact phone numbers for assistance for urgent and routine care needs. Standard insurance, coinsurance, copays and deductibles apply for chronic care management only during months in which we provide at least 20 minutes of these services. Most insurances cover these services at 100%, however patients may be responsible for any copay, coinsurance and/or deductible if applicable. This service may help you avoid the need for more expensive face-to-face services. Only one practitioner may furnish and bill the service in a calendar month. The patient may stop CCM services at any time (effective at the end of the month) by phone call to the office staff.  Patient agreed to services and verbal consent obtained.   The patient verbalized understanding of instructions, educational materials, and care plan provided today and agreed to receive a mailed copy of patient instructions, educational materials, and care plan.  Telephone follow up appointment with pharmacy team member scheduled for:2 weeks  Edythe Clarity, [place], PharmD, [place] Clinical Pharmacist Practitioner [place] [phone]

## 2022-08-02 ENCOUNTER — Telehealth: Payer: Self-pay | Admitting: Family Medicine

## 2022-08-02 NOTE — Telephone Encounter (Signed)
Received call from patient's daughter [name] to follow up on home health referral; stated patient was never contacted for scheduling since initial referral sent about 3 months ago.   Requesting for referral to be made for the following: - skilled nursing - medication management - P/T for DX: weakness and deconditioning - wounds in lower legs (cellulitis) - fall risk  [name] requesting call back with name and contact information of provider/facility patient being referred to. Please advise at [phone].

## 2022-08-05 ENCOUNTER — Ambulatory Visit: Payer: Medicare Other

## 2022-08-06 ENCOUNTER — Other Ambulatory Visit: Payer: Self-pay

## 2022-08-06 DIAGNOSIS — I5032 Chronic diastolic (congestive) heart failure: Secondary | ICD-10-CM

## 2022-08-06 DIAGNOSIS — I509 Heart failure, unspecified: Secondary | ICD-10-CM

## 2022-08-06 DIAGNOSIS — N1832 Chronic kidney disease, stage 3b: Secondary | ICD-10-CM

## 2022-08-06 DIAGNOSIS — E118 Type 2 diabetes mellitus with unspecified complications: Secondary | ICD-10-CM

## 2022-08-06 DIAGNOSIS — C7951 Secondary malignant neoplasm of bone: Secondary | ICD-10-CM

## 2022-08-06 DIAGNOSIS — I480 Paroxysmal atrial fibrillation: Secondary | ICD-10-CM

## 2022-08-06 DIAGNOSIS — L918 Other hypertrophic disorders of the skin: Secondary | ICD-10-CM

## 2022-08-06 DIAGNOSIS — I10 Essential (primary) hypertension: Secondary | ICD-10-CM

## 2022-08-06 DIAGNOSIS — I890 Lymphedema, not elsewhere classified: Secondary | ICD-10-CM

## 2022-08-20 ENCOUNTER — Other Ambulatory Visit: Payer: Self-pay

## 2022-08-20 DIAGNOSIS — N1832 Chronic kidney disease, stage 3b: Secondary | ICD-10-CM

## 2022-08-20 DIAGNOSIS — I480 Paroxysmal atrial fibrillation: Secondary | ICD-10-CM

## 2022-08-20 DIAGNOSIS — I5032 Chronic diastolic (congestive) heart failure: Secondary | ICD-10-CM

## 2022-08-20 DIAGNOSIS — M109 Gout, unspecified: Secondary | ICD-10-CM

## 2022-08-20 DIAGNOSIS — I509 Heart failure, unspecified: Secondary | ICD-10-CM

## 2022-08-20 DIAGNOSIS — I10 Essential (primary) hypertension: Secondary | ICD-10-CM

## 2022-08-20 MED ORDER — TELMISARTAN 40 MG PO TABS: 40.0000 mg | ORAL_TABLET | Freq: Every day | ORAL | 3 refills | Status: DC

## 2022-08-20 MED ORDER — CARVEDILOL 25 MG PO TABS: 25.0000 mg | ORAL_TABLET | Freq: Two times a day (BID) | ORAL | 3 refills | Status: DC

## 2022-08-20 MED ORDER — SPIRONOLACTONE 25 MG PO TABS: 25.0000 mg | ORAL_TABLET | Freq: Every day | ORAL | 3 refills | Status: DC

## 2022-08-31 ENCOUNTER — Ambulatory Visit (INDEPENDENT_AMBULATORY_CARE_PROVIDER_SITE_OTHER): Payer: Medicare Other | Admitting: Family Medicine

## 2022-08-31 VITALS — BP 124/76 | HR 56 | Temp 97.3°F | Ht 67.0 in | Wt 317.2 lb

## 2022-08-31 DIAGNOSIS — I130 Hypertensive heart and chronic kidney disease with heart failure and stage 1 through stage 4 chronic kidney disease, or unspecified chronic kidney disease: Secondary | ICD-10-CM | POA: Diagnosis not present

## 2022-08-31 DIAGNOSIS — I480 Paroxysmal atrial fibrillation: Secondary | ICD-10-CM

## 2022-08-31 DIAGNOSIS — N1832 Chronic kidney disease, stage 3b: Secondary | ICD-10-CM

## 2022-08-31 DIAGNOSIS — E039 Hypothyroidism, unspecified: Secondary | ICD-10-CM

## 2022-08-31 DIAGNOSIS — R29898 Other symptoms and signs involving the musculoskeletal system: Secondary | ICD-10-CM

## 2022-08-31 DIAGNOSIS — I5032 Chronic diastolic (congestive) heart failure: Secondary | ICD-10-CM | POA: Diagnosis not present

## 2022-08-31 DIAGNOSIS — Z23 Encounter for immunization: Secondary | ICD-10-CM | POA: Diagnosis not present

## 2022-08-31 DIAGNOSIS — E1122 Type 2 diabetes mellitus with diabetic chronic kidney disease: Secondary | ICD-10-CM | POA: Diagnosis not present

## 2022-08-31 DIAGNOSIS — C61 Malignant neoplasm of prostate: Secondary | ICD-10-CM

## 2022-08-31 DIAGNOSIS — I509 Heart failure, unspecified: Secondary | ICD-10-CM | POA: Diagnosis not present

## 2022-08-31 NOTE — Addendum Note (Signed)
Addended by: Randal Buba K on: 08/31/2022 10:13 AM   Modules accepted: Orders

## 2022-08-31 NOTE — Progress Notes (Addendum)
Subjective:    Patient ID: Terry [name], male    DOB: 02/04/1941, 81 y.o.   MRN: [id number]  Patient has a history of a retained right ureteral stent right-sided hydronephrosis, CHF with preserved EF,  and chronic kidney disease.  He also has a history of metastatic prostate cancer.  He also has a history of noncompliance with medication therapy.  At his last visit, he was uncertain of his medication and had been noncompliant with several.  Supposed to be taking telmisartan, carvedilol, and eliquis.  Was also supposed to be on spironolactone, ferrous sulfate, and levothyroxine.  Never returned for repeat labs after two weeks as recommended on 8/31.  Non compliance continues to be a tremendous issue for this patient.  Today he was late to his appointment.  Patient is once again uncertain of what medication he is taking.  Therefore I feel that his medicine list below is entirely an accurate.  Today he has +1 edema in both legs.  He denies any shortness of breath but he does report easy fatigability.  He denies any chest pain or pleurisy or hemoptysis.  Blood pressure today is well controlled Past Medical History:  Diagnosis Date   Arthritis    Cancer ([place])    Phreesia 08/04/2020   CHF, chronic (HCC)    diastolic   CKD (chronic kidney disease) stage 3, GFR 30-59 ml/min (HCC)    Diabetes mellitus without complication (HCC)    Dysrhythmia    Hypertension    Hypothyroidism    Lymphedema    Paroxysmal atrial fibrillation (HCC)    Prostate cancer [facility])    Renal insufficiency    Past Surgical History:  Procedure Laterality Date   COLON SURGERY N/A    Phreesia 08/04/2020   Medication list is uncertain due to non compliance   Allergies  Allergen Reactions   Lisinopril     FACIAL SWELLING   Social History   Socioeconomic History   Marital status: Widowed    Spouse name: Not on file   Number of children: 2   Years of education: 35   Highest education level: 12th grade  Occupational  History   Not on file  Tobacco Use   Smoking status: Never    Passive exposure: Never   Smokeless tobacco: Never  Vaping Use   Vaping Use: Never used  Substance and Sexual Activity   Alcohol use: Yes    Comment: rarely   Drug use: Yes    Types: Marijuana   Sexual activity: Not Currently  Other Topics Concern   Not on file  Social History Narrative   Lives with son. Daughter lives in [place].   Social Determinants of Health   Financial Resource Strain: Low Risk  (06/09/2022)   Overall Financial Resource Strain (CARDIA)    Difficulty of Paying Living Expenses: Not hard at all  Food Insecurity: No Food Insecurity (06/09/2022)   Hunger Vital Sign    Worried About Running Out of Food in the Last Year: Never true    Ran Out of Food in the Last Year: Never true  Transportation Needs: No Transportation Needs (06/09/2022)   PRAPARE - Hydrologist (Medical): No    Lack of Transportation (Non-Medical): No  Physical Activity: Inactive (06/09/2022)   Exercise Vital Sign    Days of Exercise per Week: 0 days    Minutes of Exercise per Session: 0 min  Stress: No Stress Concern Present (06/09/2022)   [employer] of  Occupational Health - Occupational Stress Questionnaire    Feeling of Stress : Not at all  Social Connections: Moderately Isolated (06/09/2022)   Social Connection and Isolation Panel [NHANES]    Frequency of Communication with Friends and Family: More than three times a week    Frequency of Social Gatherings with Friends and Family: More than three times a week    Attends Religious Services: More than 4 times per year    Active Member of [employer] or Organizations: No    Attends Archivist Meetings: Never    Marital Status: Widowed  Intimate Partner Violence: Not At Risk (06/09/2022)   Humiliation, Afraid, Rape, and Kick questionnaire    Fear of Current or Ex-Partner: No    Emotionally Abused: No    Physically Abused: No    Sexually Abused:  No    Past Medical History:  Diagnosis Date   Arthritis    Cancer ([place])    Phreesia 08/04/2020   CHF, chronic (HCC)    diastolic   CKD (chronic kidney disease) stage 3, GFR 30-59 ml/min (HCC)    Diabetes mellitus without complication (HCC)    Dysrhythmia    Hypertension    Hypothyroidism    Lymphedema    Paroxysmal atrial fibrillation (HCC)    Prostate cancer ([place])    Renal insufficiency       Review of Systems  Genitourinary:  Positive for dysuria.       Objective:   Physical Exam Vitals reviewed.  Constitutional:      General: He is not in acute distress.    Appearance: He is obese. He is not ill-appearing, toxic-appearing or diaphoretic.  Cardiovascular:     Rate and Rhythm: Normal rate. Rhythm irregular.     Heart sounds: Normal heart sounds.  Pulmonary:     Effort: Pulmonary effort is normal. No respiratory distress.     Breath sounds: No wheezing or rales.  Abdominal:     General: Abdomen is flat. Bowel sounds are normal.     Palpations: Abdomen is soft.  Musculoskeletal:     Right lower leg: Edema present.     Left lower leg: Edema present.  Neurological:     Mental Status: He is alert.    Sitting in a wheelchair.  Reports weakness and difficulty standing and walking.    Assessment & Plan:   Congestive heart failure, unspecified HF chronicity, unspecified heart failure type ([place]) - Plan: CBC with Differential/Platelet, COMPLETE METABOLIC PANEL WITH GFR, TSH, Flu Vaccine QUAD High Dose(Fluad)  Stage 3b chronic kidney disease ([place]) - Plan: CBC with Differential/Platelet, COMPLETE METABOLIC PANEL WITH GFR, TSH  Paroxysmal atrial fibrillation ([place]) - Plan: CBC with Differential/Platelet, COMPLETE METABOLIC PANEL WITH GFR, TSH  Hypothyroidism, unspecified type - Plan: CBC with Differential/Platelet, COMPLETE METABOLIC PANEL WITH GFR, TSH  Flu vaccine need - Plan: Flu Vaccine QUAD High Dose(Fluad)  Weakness of both lower extremities  Patient's  weakness and easy fatigability is due to his congestive heart failure coupled with atrial fibrillation coupled with chronic kidney disease and morbid obesity.  His noncompliance only exacerbates the issue.  I explained to the patient that I do not feel comfortable interpreting his lab work without knowing exactly what he is taking.  Therefore I asked him to bring his pills with him to every appointment and to call us today and tell my nurse exactly what he is on because I truly feel that the medication list below is inaccurate: Current Outpatient Medications on File  Prior to Visit  Medication Sig Dispense Refill   allopurinol (ZYLOPRIM) 100 MG tablet Take 0.5 tablets (50 mg total) by mouth daily. 30 tablet 6   apixaban (ELIQUIS) 2.5 MG TABS tablet Take 1 tablet (2.5 mg total) by mouth 2 (two) times daily. 60 tablet 11   carvedilol (COREG) 25 MG tablet Take 1 tablet (25 mg total) by mouth 2 (two) times daily with a meal. 180 tablet 3   Iron, Ferrous Sulfate, 325 (65 Fe) MG TABS Take 325 mg by mouth daily. 30 tablet 3   levothyroxine (SYNTHROID) 25 MCG tablet Take 1 tablet (25 mcg total) by mouth daily before breakfast. 90 tablet 1   Multiple Vitamins-Minerals (MULTIVITAMINS THER. W/MINERALS) TABS Take 1 tablet by mouth daily.     polyethylene glycol (MIRALAX / GLYCOLAX) 17 g packet Take 17 g by mouth daily as needed for mild constipation. 14 each 0   senna-docusate (SENOKOT-S) 8.6-50 MG tablet Take 2 tablets by mouth at bedtime as needed for mild constipation. 180 tablet 1   spironolactone (ALDACTONE) 25 MG tablet Take 1 tablet (25 mg total) by mouth daily. 90 tablet 3   tamsulosin (FLOMAX) 0.4 MG CAPS capsule Take 1 capsule (0.4 mg total) by mouth daily. 30 capsule 3   telmisartan (MICARDIS) 40 MG tablet Take 1 tablet (40 mg total) by mouth daily. 90 tablet 3   traMADol (ULTRAM) 50 MG tablet Take 50 mg by mouth every 8 (eight) hours as needed.     No current facility-administered medications on file  prior to visit.   I will repeat a CBC a CMP and a TSH.  Patient is uncertain if he is actually taking an iron supplement.  He denies any blood in his stool.  He denies any gross hematuria or hemoptysis or nosebleeds or hematemesis.  We have reduced his dose of Eliquis due to his renal dysfunction

## 2022-09-01 LAB — CBC WITH DIFFERENTIAL/PLATELET
Absolute Monocytes: 576 cells/uL (ref 200–950)
Basophils Absolute: 42 cells/uL (ref 0–200)
Basophils Relative: 0.7 %
Eosinophils Absolute: 180 cells/uL (ref 15–500)
Eosinophils Relative: 3 %
HCT: 38 % — ABNORMAL LOW (ref 38.5–50.0)
Hemoglobin: 11.6 g/dL — ABNORMAL LOW (ref 13.2–17.1)
Lymphs Abs: 954 cells/uL (ref 850–3900)
MCH: 23.7 pg — ABNORMAL LOW (ref 27.0–33.0)
MCHC: 30.5 g/dL — ABNORMAL LOW (ref 32.0–36.0)
MCV: 77.7 fL — ABNORMAL LOW (ref 80.0–100.0)
MPV: 11 fL (ref 7.5–12.5)
Monocytes Relative: 9.6 %
Neutro Abs: 4248 cells/uL (ref 1500–7800)
Neutrophils Relative %: 70.8 %
Platelets: 200 10*3/uL (ref 140–400)
RBC: 4.89 10*6/uL (ref 4.20–5.80)
RDW: 16.1 % — ABNORMAL HIGH (ref 11.0–15.0)
Total Lymphocyte: 15.9 %
WBC: 6 10*3/uL (ref 3.8–10.8)

## 2022-09-01 LAB — COMPLETE METABOLIC PANEL WITH GFR
AG Ratio: 1 (calc) (ref 1.0–2.5)
ALT: 8 U/L — ABNORMAL LOW (ref 9–46)
AST: 13 U/L (ref 10–35)
Albumin: 3.8 g/dL (ref 3.6–5.1)
Alkaline phosphatase (APISO): 68 U/L (ref 35–144)
BUN/Creatinine Ratio: 16 (calc) (ref 6–22)
BUN: 25 mg/dL (ref 7–25)
CO2: 25 mmol/L (ref 20–32)
Calcium: 9.5 mg/dL (ref 8.6–10.3)
Chloride: 105 mmol/L (ref 98–110)
Creat: 1.61 mg/dL — ABNORMAL HIGH (ref 0.70–1.22)
Globulin: 3.9 g/dL (calc) — ABNORMAL HIGH (ref 1.9–3.7)
Glucose, Bld: 121 mg/dL — ABNORMAL HIGH (ref 65–99)
Potassium: 4.3 mmol/L (ref 3.5–5.3)
Sodium: 139 mmol/L (ref 135–146)
Total Bilirubin: 0.2 mg/dL (ref 0.2–1.2)
Total Protein: 7.7 g/dL (ref 6.1–8.1)
eGFR: 43 mL/min/{1.73_m2} — ABNORMAL LOW (ref >=60)

## 2022-09-01 LAB — TSH: TSH: 5.59 mIU/L — ABNORMAL HIGH (ref 0.40–4.50)

## 2022-09-02 ENCOUNTER — Other Ambulatory Visit: Payer: Self-pay

## 2022-09-02 DIAGNOSIS — I509 Heart failure, unspecified: Secondary | ICD-10-CM

## 2022-09-02 DIAGNOSIS — Z9181 History of falling: Secondary | ICD-10-CM

## 2022-09-03 ENCOUNTER — Other Ambulatory Visit: Payer: Self-pay

## 2022-09-03 ENCOUNTER — Telehealth: Payer: Self-pay

## 2022-09-03 DIAGNOSIS — E079 Disorder of thyroid, unspecified: Secondary | ICD-10-CM

## 2022-09-03 DIAGNOSIS — I10 Essential (primary) hypertension: Secondary | ICD-10-CM

## 2022-09-03 DIAGNOSIS — I5032 Chronic diastolic (congestive) heart failure: Secondary | ICD-10-CM

## 2022-09-03 DIAGNOSIS — N401 Enlarged prostate with lower urinary tract symptoms: Secondary | ICD-10-CM

## 2022-09-03 DIAGNOSIS — I480 Paroxysmal atrial fibrillation: Secondary | ICD-10-CM

## 2022-09-03 DIAGNOSIS — N1832 Chronic kidney disease, stage 3b: Secondary | ICD-10-CM

## 2022-09-03 MED ORDER — TAMSULOSIN HCL 0.4 MG PO CAPS: 0.4000 mg | ORAL_CAPSULE | Freq: Every day | ORAL | 3 refills | Status: DC

## 2022-09-03 MED ORDER — LEVOTHYROXINE SODIUM 50 MCG PO TABS: 50.0000 ug | ORAL_TABLET | Freq: Every day | ORAL | 3 refills | Status: DC

## 2022-09-03 MED ORDER — CARVEDILOL 25 MG PO TABS: 25.0000 mg | ORAL_TABLET | Freq: Two times a day (BID) | ORAL | 3 refills | Status: DC

## 2022-09-03 NOTE — Telephone Encounter (Signed)
Otila Kluver with [facility] called with patient's mediation list. Otila Kluver states pt has and is taking:  Eliquis 2.5 mg BID Losartan 40 mg QD Spironlacotne 25 mg QD Ferrous Sulfate 325 mg QD Allopurinol 100 mg 1/2 tab daily Levothyroixine (Euthyrox) 25 mg daily  Otila Kluver states these medications are on his list but but pt does not have these in his home:  Carvedilol 25 mg BID Furosemide 25 mg Daily HCTZ 25 mg daily Tamsulosin 0.4 mg daily Tramadol 50 mg every 8 hrs PRN

## 2022-09-08 ENCOUNTER — Telehealth: Payer: Self-pay

## 2022-09-08 NOTE — Telephone Encounter (Signed)
FYI: Received call from Jana Half PT with [facility]. Pt had a fall early AM today. Tried to call patient to follow up, no answer on home or cell numbers and no voicemail set up.

## 2022-09-22 ENCOUNTER — Telehealth: Payer: Self-pay | Admitting: Family Medicine

## 2022-09-22 DIAGNOSIS — Z0189 Encounter for other specified special examinations: Secondary | ICD-10-CM

## 2022-09-22 NOTE — Telephone Encounter (Signed)
Received call from Marcella Dubs with [facility] to request referral for patient to go to outpatient PT; stated patient is ready to go.   Please advise Doren Custard with any questions at [phone].   Please advise patient at [phone].

## 2022-09-24 ENCOUNTER — Other Ambulatory Visit: Payer: Self-pay | Admitting: Family Medicine

## 2022-09-24 NOTE — Telephone Encounter (Signed)
Referral for PT sent 08/24/22

## 2022-10-01 ENCOUNTER — Other Ambulatory Visit: Payer: Self-pay | Admitting: Family Medicine

## 2022-10-01 ENCOUNTER — Other Ambulatory Visit: Payer: Self-pay

## 2022-10-01 ENCOUNTER — Telehealth: Payer: Self-pay

## 2022-10-01 DIAGNOSIS — I509 Heart failure, unspecified: Secondary | ICD-10-CM

## 2022-10-01 DIAGNOSIS — I10 Essential (primary) hypertension: Secondary | ICD-10-CM

## 2022-10-01 MED ORDER — CARVEDILOL 6.25 MG PO TABS: 6.2500 mg | ORAL_TABLET | Freq: Two times a day (BID) | ORAL | 3 refills | Status: DC

## 2022-10-01 MED ORDER — LOSARTAN POTASSIUM 50 MG PO TABS: 50.0000 mg | ORAL_TABLET | Freq: Every day | ORAL | 3 refills | Status: DC

## 2022-10-01 NOTE — Telephone Encounter (Signed)
Per Dr. Samella Parr order:  Patient needs to be taking losartan 50 mg a day and coreg 6.25 mg bid for chf and recheck bp and labs in 1 month.

## 2022-10-01 NOTE — Progress Notes (Signed)
Patient is not taking micardis and coreg.  Will have the patient resume ARB and coreg for CHF.

## 2022-10-05 ENCOUNTER — Ambulatory Visit: Payer: Medicare Other | Attending: Physical Therapy | Admitting: Physical Therapy

## 2022-10-29 ENCOUNTER — Other Ambulatory Visit: Payer: Self-pay | Admitting: Family Medicine

## 2022-10-29 DIAGNOSIS — I509 Heart failure, unspecified: Secondary | ICD-10-CM

## 2022-12-08 ENCOUNTER — Telehealth: Payer: Self-pay

## 2022-12-08 NOTE — Progress Notes (Signed)
  Chronic Care Management   Note  12/08/2022 Name: Lindsey Hommel MRN: [id number] DOB: 31-Mar-1941  Reis Goga is a 82 y.o. year old male who is a primary care patient of Pickard, [name], MD. I reached out to Vincente Liberty by phone today in response to a referral sent by Mr. Jermie Hippe PCP.  The first contact attempt was unsuccessful.   Follow up plan: Additional outreach attempts will be made.  Noreene Larsson, [place], [place] [address] Direct Dial: [phone] [email or web address]

## 2022-12-15 NOTE — Progress Notes (Signed)
  Chronic Care Management   Note  12/15/2022 Name: Mazen Marcin MRN: [id number] DOB: 02/03/1941  Braheem Tomasik is a 82 y.o. year old male who is a primary care patient of Pickard, [name], MD. I reached out to Vincente Liberty by phone today in response to a referral sent by Mr. Jayveon Convey PCP.  Mr. Adamczak was given information about Chronic Care Management services today including:  CCM service includes personalized support from designated clinical staff supervised by the physician, including individualized plan of care and coordination with other care providers 24/7 contact phone numbers for assistance for urgent and routine care needs. Service will only be billed when office clinical staff spend 20 minutes or more in a month to coordinate care. Only one practitioner may furnish and bill the service in a calendar month. The patient may stop CCM services at amy time (effective at the end of the month) by phone call to the office staff. The patient will be responsible for cost sharing (co-pay) or up to 20% of the service fee (after annual deductible is met)  Mr. Hassaan Crite  agreedto scheduling an appointment with the CCM RN Case Manager   Follow up plan: Patient agreed to scheduled appointment with RN Case Manager on 12/17/2022(date/time).   Noreene Larsson, [place], [place] [address] Direct Dial: [phone] [email or web address]

## 2022-12-17 ENCOUNTER — Ambulatory Visit: Payer: Medicare Other | Admitting: *Deleted

## 2022-12-17 DIAGNOSIS — I509 Heart failure, unspecified: Secondary | ICD-10-CM

## 2022-12-17 DIAGNOSIS — I10 Essential (primary) hypertension: Secondary | ICD-10-CM

## 2022-12-17 NOTE — Chronic Care Management (AMB) (Signed)
Chronic Care Management   CCM RN Visit Note  12/17/2022 Name: Terry [name] MRN: [id number] DOB: 1940-12-29  Subjective: Terry [name] is a 82 y.o. year old male who is a primary care patient of Pickard, [name], MD. The patient was referred to the Chronic Care Management team for assistance with care management needs subsequent to provider initiation of CCM services and plan of care.    Today's Visit:  Engaged with patient by telephone for initial visit.     SDOH Interventions Today    Flowsheet Row Most Recent Value  SDOH Interventions   Food Insecurity Interventions Intervention Not Indicated  Housing Interventions Intervention Not Indicated  Transportation Interventions Intervention Not Indicated  Utilities Interventions Intervention Not Indicated  Financial Strain Interventions Intervention Not Indicated  Physical Activity Interventions Patient Refused  Stress Interventions Intervention Not Indicated  Social Connections Interventions Patient Refused  [pt states difficult to leave home]         Goals Addressed             This Visit's Progress    CCM (CONGESTIVE HEART FAILURE) EXPECTED OUTCOME: MONITOR, SELF-MANAGE AND REDUCE SYMPTOMS OF CONGESTIVE HEART FAILURE       Current Barriers:  Knowledge Deficits related to Congestive Heart Failure Chronic Disease Management support and education needs related to Congestive Heart Failure, diet Patient reports he has a scale but has not been weighing lately, states he will try to start again  Planned Interventions: Basic overview and discussion of pathophysiology of Heart Failure reviewed Provided education on low sodium diet Reviewed Heart Failure Action Plan in depth and provided written copy Assessed need for readable accurate scales in home Provided education about placing scale on hard, flat surface Advised patient to weigh each morning after emptying bladder Discussed importance of daily weight and advised patient to  weigh and record daily Reviewed role of diuretics in prevention of fluid overload and management of heart failure Discussed the importance of keeping all appointments with provider Screening for signs and symptoms of depression related to chronic disease state  Assessed social determinant of health barriers  Symptom Management: Take medications as prescribed   Attend all scheduled provider appointments Call pharmacy for medication refills 3-7 days in advance of running out of medications Call provider office for new concerns or questions  call office if I gain more than 2 pounds in one day or 5 pounds in one week do ankle pumps when sitting keep legs up while sitting watch for swelling in feet, ankles and legs every day weigh myself daily follow rescue plan if symptoms flare-up eat more whole grains, fruits and vegetables, lean meats and healthy fats track symptoms and what helps feel better or worse dress right for the weather, hot or cold Look over education sent via my chart- Heart Failure action plan  Follow Up Plan: Telephone follow up appointment with care management team member scheduled for:  02/01/23 at 1045 am       CCM (HYPERTENSION) EXPECTED OUTCOME: MONITOR, SELF-MANAGE AND REDUCE SYMPTOMS OF HYPERTENSION       Current Barriers:  Knowledge Deficits related to Hypertension management Care Coordination needs related to hypertension, diet, medications  in a patient with Hypertension, CHF Chronic Disease Management support and education needs related to Hypertension, diet Patient reports his son and son in law live with him and assist as needed, son in law drives and assists pt some of the time, pt states he drives in town to appointments Patient reports he has  WC and walker and uses as needed, has shower seat Patient reports he has blood pressure cuff but has not been checking recently [facility] pharmacist will be working with patient  Planned Interventions: Evaluation of  current treatment plan related to hypertension self management and patient's adherence to plan as established by provider;   Reviewed prescribed diet low sodium Reviewed medications with patient and discussed importance of compliance;  Counseled on the importance of exercise goals with target of 150 minutes per week Discussed plans with patient for ongoing care management follow up and provided patient with direct contact information for care management team; Advised patient, providing education and rationale, to monitor blood pressure daily and record, calling PCP for findings outside established parameters;  Discussed complications of poorly controlled blood pressure such as heart disease, stroke, circulatory complications, vision complications, kidney impairment, sexual dysfunction;  Screening for signs and symptoms of depression related to chronic disease state;  Assessed social determinant of health barriers;  Reviewed all upcoming scheduled appointments, pt wrote down and read back appointment dates and times Education sent via my chart- Low sodium diet  Symptom Management: Take medications as prescribed   Attend all scheduled provider appointments Call pharmacy for medication refills 3-7 days in advance of running out of medications Call provider office for new concerns or questions  check blood pressure 3 times per week choose a place to take my blood pressure (home, clinic or office, retail store) write blood pressure results in a log or diary learn about high blood pressure keep a blood pressure log take blood pressure log to all doctor appointments keep all doctor appointments take medications for blood pressure exactly as prescribed eat more whole grains, fruits and vegetables, lean meats and healthy fats Look over education sent via my chart- low sodium diet 12/21/22 at 10 am Lab work at primary care provider office 12/22/22 at 1 pm expect a call from pharmacist 12/24/22 at 3 pm  Physical at primary care provider  Follow Up Plan: Telephone follow up appointment with care management team member scheduled for: 02/01/23 at 1045 am          Plan:Telephone follow up appointment with care management team member scheduled for:  02/01/23 at [place] am  [name] [facility], BSN RN Case Manager [facility] [phone]

## 2022-12-17 NOTE — Patient Instructions (Signed)
Please call the care guide team at [phone] if you need to cancel or reschedule your appointment.   If you are experiencing a Mental Health or [place] or need someone to talk to, please call the Suicide and Crisis Lifeline: 988 call the [place] National Suicide Prevention Lifeline: 2691989460 or TTY: 317-829-2613 TTY 8731024809) to talk to a trained counselor call 1-800-273-TALK (toll free, 24 hour hotline) go to [facility] [address] [place] [phone]) call 911   Following is a copy of the CCM Program Consent:  CCM service includes personalized support from designated clinical staff supervised by the physician, including individualized plan of care and coordination with other care providers 24/7 contact phone numbers for assistance for urgent and routine care needs. Service will only be billed when office clinical staff spend 20 minutes or more in a month to coordinate care. Only one practitioner may furnish and bill the service in a calendar month. The patient may stop CCM services at amy time (effective at the end of the month) by phone call to the office staff. The patient will be responsible for cost sharing (co-pay) or up to 20% of the service fee (after annual deductible is met)  Following is a copy of your full provider care plan:   Goals Addressed             This Visit's Progress    CCM ([place]) EXPECTED OUTCOME: MONITOR, SELF-MANAGE AND REDUCE SYMPTOMS OF CONGESTIVE HEART FAILURE       Current Barriers:  Knowledge Deficits related to Congestive Heart Failure Chronic Disease Management support and education needs related to Congestive Heart Failure, diet Patient reports he has a scale but has not been weighing lately, states he will try to start again  Planned Interventions: Basic overview and discussion of pathophysiology of Heart Failure reviewed Provided education on low sodium  diet Reviewed Heart Failure Action Plan in depth and provided written copy Assessed need for readable accurate scales in home Provided education about placing scale on hard, flat surface Advised patient to weigh each morning after emptying bladder Discussed importance of daily weight and advised patient to weigh and record daily Reviewed role of diuretics in prevention of fluid overload and management of heart failure Discussed the importance of keeping all appointments with provider Screening for signs and symptoms of depression related to chronic disease state  Assessed social determinant of health barriers  Symptom Management: Take medications as prescribed   Attend all scheduled provider appointments Call pharmacy for medication refills 3-7 days in advance of running out of medications Call provider office for new concerns or questions  call office if I gain more than 2 pounds in one day or 5 pounds in one week do ankle pumps when sitting keep legs up while sitting watch for swelling in feet, ankles and legs every day weigh myself daily follow rescue plan if symptoms flare-up eat more whole grains, fruits and vegetables, lean meats and healthy fats track symptoms and what helps feel better or worse dress right for the weather, hot or cold Look over education sent via my chart- Heart Failure action plan  Follow Up Plan: Telephone follow up appointment with care management team member scheduled for:  02/01/23 at 1045 am       CCM (HYPERTENSION) EXPECTED OUTCOME: MONITOR, SELF-MANAGE AND REDUCE SYMPTOMS OF HYPERTENSION       Current Barriers:  Knowledge Deficits related to Hypertension management Care Coordination needs related to  hypertension, diet, medications  in a patient with Hypertension, CHF Chronic Disease Management support and education needs related to Hypertension, diet Patient reports his son and son in law live with him and assist as needed, son in law drives and  assists pt some of the time, pt states he drives in town to appointments Patient reports he has WC and walker and uses as needed, has shower seat Patient reports he has blood pressure cuff but has not been checking recently [facility] pharmacist will be working with patient  Planned Interventions: Evaluation of current treatment plan related to hypertension self management and patient's adherence to plan as established by provider;   Reviewed prescribed diet low sodium Reviewed medications with patient and discussed importance of compliance;  Counseled on the importance of exercise goals with target of 150 minutes per week Discussed plans with patient for ongoing care management follow up and provided patient with direct contact information for care management team; Advised patient, providing education and rationale, to monitor blood pressure daily and record, calling PCP for findings outside established parameters;  Discussed complications of poorly controlled blood pressure such as heart disease, stroke, circulatory complications, vision complications, kidney impairment, sexual dysfunction;  Screening for signs and symptoms of depression related to chronic disease state;  Assessed social determinant of health barriers;  Reviewed all upcoming scheduled appointments, pt wrote down and read back appointment dates and times Education sent via my chart- Low sodium diet  Symptom Management: Take medications as prescribed   Attend all scheduled provider appointments Call pharmacy for medication refills 3-7 days in advance of running out of medications Call provider office for new concerns or questions  check blood pressure 3 times per week choose a place to take my blood pressure (home, clinic or office, retail store) write blood pressure results in a log or diary learn about high blood pressure keep a blood pressure log take blood pressure log to all doctor appointments keep all doctor  appointments take medications for blood pressure exactly as prescribed eat more whole grains, fruits and vegetables, lean meats and healthy fats Look over education sent via my chart- low sodium diet 12/21/22 at 10 am Lab work at primary care provider office 12/22/22 at 1 pm expect a call from pharmacist 12/24/22 at 3 pm Physical at primary care provider  Follow Up Plan: Telephone follow up appointment with care management team member scheduled for: 02/01/23 at 1045 am          Patient verbalizes understanding of instructions and care plan provided today and agrees to view in [place]. Active MyChart status and patient understanding of how to access instructions and care plan via MyChart confirmed with patient.     Telephone follow up appointment with care management team member scheduled for:  02/01/23 at 70 am  Heart Failure Action Plan A heart failure action plan helps you understand what to do when you have symptoms of heart failure. Your action plan is a color-coded plan that lists the symptoms to watch for and indicates what actions to take. If you have symptoms in the red zone, you need medical care right away. If you have symptoms in the yellow zone, you are having problems. If you have symptoms in the green zone, you are doing well. Follow the plan that was created by you and your health care provider. Review your plan each time you visit your health care provider. Red zone These signs and symptoms mean you should get medical help right away:  You have trouble breathing when resting. You have a dry cough that is getting worse. You have swelling or pain in your legs or abdomen that is getting worse. You suddenly gain more than 2-3 lb (0.9-1.4 kg) in 24 hours, or more than 5 lb (2.3 kg) in a week. This amount may be more or less depending on your condition. You have trouble staying awake or you feel confused. You have chest pain. You do not have an appetite. You pass out. You have  worsening sadness or depression. If you have any of these symptoms, call your local emergency services (911 in the [place] right away. Do not drive yourself to the hospital. Yellow zone These signs and symptoms mean your condition may be getting worse and you should make some changes: You have trouble breathing when you are active, or you need to sleep with your head raised on extra pillows to help you breathe. You have swelling in your legs or abdomen. You gain 2-3 lb (0.9-1.4 kg) in 24 hours, or 5 lb (2.3 kg) in a week. This amount may be more or less depending on your condition. You get tired easily. You have trouble sleeping. You have a dry cough. If you have any of these symptoms: Contact your health care provider within the next day. Your health care provider may adjust your medicines. Green zone These signs mean you are doing well and can continue what you are doing: You do not have shortness of breath. You have very little swelling or no new swelling. Your weight is stable (no gain or loss). You have a normal activity level. You do not have chest pain or any other new symptoms. Follow these instructions at home: Take over-the-counter and prescription medicines only as told by your health care provider. Weigh yourself daily. Your target weight is __________ lb (__________ kg). Call your health care provider if you gain more than __________ lb (__________ kg) in 24 hours, or more than __________ lb (__________ kg) in a week. Health care provider name: _____________________________________________________ Health care provider phone number: _____________________________________________________ Eat a heart-healthy diet. Work with a diet and nutrition specialist (dietitian) to create an eating plan that is best for you. Keep all follow-up visits. This is important. Where to find more information American Heart Association: Summary A heart failure action plan helps you understand what  to do when you have symptoms of heart failure. Follow the action plan that was created by you and your health care provider. Get help right away if you have any symptoms in the red zone. This information is not intended to replace advice given to you by your health care provider. Make sure you discuss any questions you have with your health care provider. Document Revised: 02/09/2022 Document Reviewed: 06/16/2020 Elsevier Patient Education  [place]. Low-Sodium Eating Plan Sodium, which is an element that makes up salt, helps you maintain a healthy balance of fluids in your body. Too much sodium can increase your blood pressure and cause fluid and waste to be held in your body. Your health care provider or dietitian may recommend following this plan if you have high blood pressure (hypertension), kidney disease, liver disease, or heart failure. Eating less sodium can help lower your blood pressure, reduce swelling, and protect your heart, liver, and kidneys. What are tips for following this plan? Reading food labels The Nutrition Facts label lists the amount of sodium in one serving of the food. If you eat more than one serving, you  must multiply the listed amount of sodium by the number of servings. Choose foods with less than 140 mg of sodium per serving. Avoid foods with 300 mg of sodium or more per serving. Shopping  Look for lower-sodium products, often labeled as "low-sodium" or "no salt added." Always check the sodium content, even if foods are labeled as "unsalted" or "no salt added." Buy fresh foods. Avoid canned foods and pre-made or frozen meals. Avoid canned, cured, or processed meats. Buy breads that have less than 80 mg of sodium per slice. Cooking  Eat more home-cooked food and less restaurant, buffet, and fast food. Avoid adding salt when cooking. Use salt-free seasonings or herbs instead of table salt or sea salt. Check with your health care provider or pharmacist  before using salt substitutes. Cook with plant-based oils, such as canola, sunflower, or olive oil. Meal planning When eating at a restaurant, ask that your food be prepared with less salt or no salt, if possible. Avoid dishes labeled as brined, pickled, cured, smoked, or made with soy sauce, miso, or teriyaki sauce. Avoid foods that contain MSG (monosodium glutamate). MSG is sometimes added to [place] food, bouillon, and some canned foods. Make meals that can be grilled, baked, poached, roasted, or steamed. These are generally made with less sodium. General information Most people on this plan should limit their sodium intake to 1,500-2,000 mg (milligrams) of sodium each day. What foods should I eat? Fruits Fresh, frozen, or canned fruit. Fruit juice. Vegetables Fresh or frozen vegetables. "No salt added" canned vegetables. "No salt added" tomato sauce and paste. Low-sodium or reduced-sodium tomato and vegetable juice. Grains Low-sodium cereals, including oats, puffed wheat and rice, and shredded wheat. Low-sodium crackers. Unsalted rice. Unsalted pasta. Low-sodium bread. Whole-grain breads and whole-grain pasta. Meats and other proteins Fresh or frozen (no salt added) meat, poultry, seafood, and fish. Low-sodium canned tuna and salmon. Unsalted nuts. Dried peas, beans, and lentils without added salt. Unsalted canned beans. Eggs. Unsalted nut butters. Dairy Milk. Soy milk. Cheese that is naturally low in sodium, such as ricotta cheese, fresh mozzarella, or Swiss cheese. Low-sodium or reduced-sodium cheese. Cream cheese. Yogurt. Seasonings and condiments Fresh and dried herbs and spices. Salt-free seasonings. Low-sodium mustard and ketchup. Sodium-free salad dressing. Sodium-free light mayonnaise. Fresh or refrigerated horseradish. Lemon juice. Vinegar. Other foods Homemade, reduced-sodium, or low-sodium soups. Unsalted popcorn and pretzels. Low-salt or salt-free chips. The items listed above  may not be a complete list of foods and beverages you can eat. Contact a dietitian for more information. What foods should I avoid? Vegetables Sauerkraut, pickled vegetables, and relishes. Olives. [place] fries. Onion rings. Regular canned vegetables (not low-sodium or reduced-sodium). Regular canned tomato sauce and paste (not low-sodium or reduced-sodium). Regular tomato and vegetable juice (not low-sodium or reduced-sodium). Frozen vegetables in sauces. Grains Instant hot cereals. Bread stuffing, pancake, and biscuit mixes. Croutons. Seasoned rice or pasta mixes. Noodle soup cups. Boxed or frozen macaroni and cheese. Regular salted crackers. Self-rising flour. Meats and other proteins Meat or fish that is salted, canned, smoked, spiced, or pickled. Precooked or cured meat, such as sausages or meat loaves. Berniece Salines. Ham. Pepperoni. Hot dogs. Corned beef. Chipped beef. Salt pork. Jerky. Pickled herring. Anchovies and sardines. Regular canned tuna. Salted nuts. Dairy Processed cheese and cheese spreads. Hard cheeses. Cheese curds. Blue cheese. Feta cheese. String cheese. Regular cottage cheese. Buttermilk. Canned milk. Fats and oils Salted butter. Regular margarine. Ghee. Bacon fat. Seasonings and condiments Onion salt, garlic salt, seasoned salt, table  salt, and sea salt. Canned and packaged gravies. Worcestershire sauce. Tartar sauce. Barbecue sauce. Teriyaki sauce. Soy sauce, including reduced-sodium. Steak sauce. Fish sauce. Oyster sauce. Cocktail sauce. Horseradish that you find on the shelf. Regular ketchup and mustard. Meat flavorings and tenderizers. Bouillon cubes. Hot sauce. Pre-made or packaged marinades. Pre-made or packaged taco seasonings. Relishes. Regular salad dressings. Salsa. Other foods Salted popcorn and pretzels. Corn chips and puffs. Potato and tortilla chips. Canned or dried soups. Pizza. Frozen entrees and pot pies. The items listed above may not be a complete list of foods and  beverages you should avoid. Contact a dietitian for more information. Summary Eating less sodium can help lower your blood pressure, reduce swelling, and protect your heart, liver, and kidneys. Most people on this plan should limit their sodium intake to 1,500-2,000 mg (milligrams) of sodium each day. Canned, boxed, and frozen foods are high in sodium. Restaurant foods, fast foods, and pizza are also very high in sodium. You also get sodium by adding salt to food. Try to cook at home, eat more fresh fruits and vegetables, and eat less fast food and canned, processed, or prepared foods. This information is not intended to replace advice given to you by your health care provider. Make sure you discuss any questions you have with your health care provider. Document Revised: 12/07/2019 Document Reviewed: 10/03/2019 Elsevier Patient Education  [place].

## 2022-12-17 NOTE — Plan of Care (Signed)
Chronic Care Management Provider Comprehensive Care Plan    12/17/2022 Name: Terry [name] MRN: [id number] DOB: May 30, 1941  Referral to Chronic Care Management (CCM) services was placed by Provider:  Jenna Luo MD on Date: 10/29/22.  Chronic Condition 1: HYPERTENSION Provider Assessment and Plan  Essential hypertension     Relevant Orders    EKG 12-Lead     Expected Outcome/Goals Addressed This Visit (Provider CCM goals/Provider Assessment and plan  CCM (HYPERTENSION) EXPECTED OUTCOME: MONITOR, SELF-MANAGE AND REDUCE SYMPTOMS OF HYPERTENSION  Symptom Management Condition 1: Take medications as prescribed   Attend all scheduled provider appointments Call pharmacy for medication refills 3-7 days in advance of running out of medications Call provider office for new concerns or questions  check blood pressure 3 times per week choose a place to take my blood pressure (home, clinic or office, retail store) write blood pressure results in a log or diary learn about high blood pressure keep a blood pressure log take blood pressure log to all doctor appointments keep all doctor appointments take medications for blood pressure exactly as prescribed eat more whole grains, fruits and vegetables, lean meats and healthy fats Look over education sent via my chart- low sodium diet 12/21/22 at 10 am Lab work at primary care provider office 12/22/22 at 1 pm expect a call from pharmacist 12/24/22 at 3 pm Physical at primary care provider  Chronic Condition 2: [place] Provider Assessment and Plan Congestive heart failure, unspecified HF chronicity, unspecified heart failure type ([place]) - Plan: CBC with Differential/Platelet, COMPLETE METABOLIC PANEL WITH GFR, TSH, Flu Vaccine QUAD High Dose(Fluad)    Expected Outcome/Goals Addressed This Visit (Provider CCM goals/Provider Assessment and plan  CCM (CONGESTIVE HEART FAILURE) EXPECTED OUTCOME: MONITOR, SELF-MANAGE AND REDUCE SYMPTOMS  OF CONGESTIVE HEART FAILURE  Symptom Management Condition 2: Take medications as prescribed   Attend all scheduled provider appointments Call pharmacy for medication refills 3-7 days in advance of running out of medications Call provider office for new concerns or questions  call office if I gain more than 2 pounds in one day or 5 pounds in one week do ankle pumps when sitting keep legs up while sitting watch for swelling in feet, ankles and legs every day weigh myself daily follow rescue plan if symptoms flare-up eat more whole grains, fruits and vegetables, lean meats and healthy fats track symptoms and what helps feel better or worse dress right for the weather, hot or cold Look over education sent via my chart- Heart Failure action plan  Problem List Patient Active Problem List   Diagnosis Date Noted   Asymptomatic microscopic hematuria 01/22/2022   Benign prostatic hyperplasia with lower urinary tract symptoms 01/22/2022   Nocturia 01/22/2022   Retained ureteral stent 01/22/2022   Goals of care, counseling/discussion 09/04/2021   Sepsis ([place]) 78/58/8502   Complicated UTI (urinary tract infection) 10/24/2020   Chronic heart failure with preserved ejection fraction (HFpEF) ([place]) 10/24/2020   CHF, chronic ([place])    Prostate cancer ([place])    Congestive heart failure ([place]) 07/02/2020   Macrocytic anemia 06/30/2020   Elevated PSA 06/30/2020   Cellulitis of lower extremity 06/30/2020   Prediabetes 01/04/2019   Paroxysmal atrial fibrillation ([place]) 01/01/2019   Risk for falls 12/27/2017   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[place]) 10/18/2017   Right ureteral stone 10/01/2017   Iron deficiency anemia due to chronic blood loss 06/22/2016   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   Depression with anxiety 06/19/2015  CKD (chronic kidney disease) stage 3, GFR 30-59 ml/min (HCC) 05/26/2015   Essential hypertension 11/01/2014   Gouty arthritis of toe  11/01/2014   Morbid obesity due to excess calories ([place]) 11/01/2014   Primary osteoarthritis of both knees 11/01/2014    Medication Management  Current Outpatient Medications:    allopurinol (ZYLOPRIM) 100 MG tablet, Take 0.5 tablets (50 mg total) by mouth daily., Disp: 30 tablet, Rfl: 6   apixaban (ELIQUIS) 2.5 MG TABS tablet, Take 1 tablet (2.5 mg total) by mouth 2 (two) times daily., Disp: 60 tablet, Rfl: 11   carvedilol (COREG) 6.25 MG tablet, Take 1 tablet (6.25 mg total) by mouth 2 (two) times daily with a meal., Disp: 90 tablet, Rfl: 3   levothyroxine (SYNTHROID) 50 MCG tablet, Take 1 tablet (50 mcg total) by mouth daily., Disp: 90 tablet, Rfl: 3   losartan (COZAAR) 50 MG tablet, Take 1 tablet (50 mg total) by mouth daily., Disp: 90 tablet, Rfl: 3   Multiple Vitamins-Minerals (MULTIVITAMINS THER. W/MINERALS) TABS, Take 1 tablet by mouth daily., Disp: , Rfl:    spironolactone (ALDACTONE) 25 MG tablet, Take 1 tablet (25 mg total) by mouth daily., Disp: 90 tablet, Rfl: 3   Iron, Ferrous Sulfate, 325 (65 Fe) MG TABS, Take 325 mg by mouth daily. (Patient not taking: Reported on 12/17/2022), Disp: 30 tablet, Rfl: 3  Cognitive Assessment Identity Confirmed: : Name; DOB Cognitive Status: Normal Other:  : N/A   Functional Assessment Hearing Difficulty or Deaf: no Wear Glasses or Blind: yes Vision Management: reading glasses Concentrating, Remembering or Making Decisions Difficulty (CP): no Difficulty Communicating: no Difficulty Eating/Swallowing: no Walking or Climbing Stairs Difficulty: yes Walking or Climbing Stairs: ambulation difficulty, requires equipment Mobility Management: uses walker and WC Dressing/Bathing Difficulty: no Doing Errands Independently Difficulty (such as shopping) (CP): no (pt states he drives in the city)   Caregiver Assessment  Primary Source of Support/Comfort: child(ren) (patient's daughter from [place] is coming to visit this weekend and stay 2  weeks) Name of Support/Comfort Primary Source: son and former in son in law People in Home: child(ren), dependent; other relative(s) Name(s) of People in Home: son and son in law   Planned Interventions  Basic overview and discussion of pathophysiology of Heart Failure reviewed Provided education on low sodium diet Reviewed Heart Failure Action Plan in depth and provided written copy Assessed need for readable accurate scales in home Provided education about placing scale on hard, flat surface Advised patient to weigh each morning after emptying bladder Discussed importance of daily weight and advised patient to weigh and record daily Reviewed role of diuretics in prevention of fluid overload and management of heart failure Discussed the importance of keeping all appointments with provider Screening for signs and symptoms of depression related to chronic disease state  Assessed social determinant of health barriers Evaluation of current treatment plan related to hypertension self management and patient's adherence to plan as established by provider;   Reviewed prescribed diet low sodium Reviewed medications with patient and discussed importance of compliance;  Counseled on the importance of exercise goals with target of 150 minutes per week Discussed plans with patient for ongoing care management follow up and provided patient with direct contact information for care management team; Advised patient, providing education and rationale, to monitor blood pressure daily and record, calling PCP for findings outside established parameters;  Discussed complications of poorly controlled blood pressure such as heart disease, stroke, circulatory complications, vision complications, kidney impairment, sexual dysfunction;  Screening  for signs and symptoms of depression related to chronic disease state;  Assessed social determinant of health barriers;  Reviewed all upcoming scheduled appointments, pt  wrote down and read back appointment dates and times Education sent via my chart- Low sodium diet  Interaction and coordination with outside resources, practitioners, and providers See CCM Referral  Care Plan: Available in MyChart

## 2022-12-21 ENCOUNTER — Other Ambulatory Visit: Payer: Medicare Other

## 2022-12-21 DIAGNOSIS — E118 Type 2 diabetes mellitus with unspecified complications: Secondary | ICD-10-CM

## 2022-12-21 DIAGNOSIS — N1832 Chronic kidney disease, stage 3b: Secondary | ICD-10-CM

## 2022-12-21 DIAGNOSIS — E538 Deficiency of other specified B group vitamins: Secondary | ICD-10-CM

## 2022-12-21 DIAGNOSIS — N401 Enlarged prostate with lower urinary tract symptoms: Secondary | ICD-10-CM

## 2022-12-21 DIAGNOSIS — Z13220 Encounter for screening for lipoid disorders: Secondary | ICD-10-CM

## 2022-12-21 DIAGNOSIS — I509 Heart failure, unspecified: Secondary | ICD-10-CM

## 2022-12-21 DIAGNOSIS — D500 Iron deficiency anemia secondary to blood loss (chronic): Secondary | ICD-10-CM

## 2022-12-21 DIAGNOSIS — I10 Essential (primary) hypertension: Secondary | ICD-10-CM

## 2022-12-21 DIAGNOSIS — E079 Disorder of thyroid, unspecified: Secondary | ICD-10-CM

## 2022-12-22 ENCOUNTER — Telehealth: Payer: Self-pay | Admitting: Internal Medicine

## 2022-12-22 ENCOUNTER — Other Ambulatory Visit: Payer: Medicare Other | Admitting: Pharmacist

## 2022-12-22 ENCOUNTER — Other Ambulatory Visit: Payer: Self-pay | Admitting: Pharmacist

## 2022-12-22 LAB — CBC WITH DIFFERENTIAL/PLATELET
Absolute Monocytes: 667 cells/uL (ref 200–950)
Basophils Absolute: 41 cells/uL (ref 0–200)
Basophils Relative: 0.7 %
Eosinophils Absolute: 174 cells/uL (ref 15–500)
Eosinophils Relative: 3 %
HCT: 41.3 % (ref 38.5–50.0)
Hemoglobin: 12.6 g/dL — ABNORMAL LOW (ref 13.2–17.1)
Lymphs Abs: 916 cells/uL (ref 850–3900)
MCH: 24.1 pg — ABNORMAL LOW (ref 27.0–33.0)
MCHC: 30.5 g/dL — ABNORMAL LOW (ref 32.0–36.0)
MCV: 79 fL — ABNORMAL LOW (ref 80.0–100.0)
MPV: 10.5 fL (ref 7.5–12.5)
Monocytes Relative: 11.5 %
Neutro Abs: 4002 cells/uL (ref 1500–7800)
Neutrophils Relative %: 69 %
Platelets: 200 10*3/uL (ref 140–400)
RBC: 5.23 10*6/uL (ref 4.20–5.80)
RDW: 15.2 % — ABNORMAL HIGH (ref 11.0–15.0)
Total Lymphocyte: 15.8 %
WBC: 5.8 10*3/uL (ref 3.8–10.8)

## 2022-12-22 LAB — COMPLETE METABOLIC PANEL WITH GFR
AG Ratio: 0.9 (calc) — ABNORMAL LOW (ref 1.0–2.5)
ALT: 7 U/L — ABNORMAL LOW (ref 9–46)
AST: 13 U/L (ref 10–35)
Albumin: 3.8 g/dL (ref 3.6–5.1)
Alkaline phosphatase (APISO): 73 U/L (ref 35–144)
BUN/Creatinine Ratio: 16 (calc) (ref 6–22)
BUN: 28 mg/dL — ABNORMAL HIGH (ref 7–25)
CO2: 22 mmol/L (ref 20–32)
Calcium: 9.9 mg/dL (ref 8.6–10.3)
Chloride: 106 mmol/L (ref 98–110)
Creat: 1.75 mg/dL — ABNORMAL HIGH (ref 0.70–1.22)
Globulin: 4.3 g/dL (calc) — ABNORMAL HIGH (ref 1.9–3.7)
Glucose, Bld: 100 mg/dL — ABNORMAL HIGH (ref 65–99)
Potassium: 4.3 mmol/L (ref 3.5–5.3)
Sodium: 140 mmol/L (ref 135–146)
Total Bilirubin: 0.3 mg/dL (ref 0.2–1.2)
Total Protein: 8.1 g/dL (ref 6.1–8.1)
eGFR: 39 mL/min/{1.73_m2} — ABNORMAL LOW (ref >=60)

## 2022-12-22 LAB — MICROALBUMIN / CREATININE URINE RATIO
Creatinine, Urine: 129 mg/dL (ref 20–320)
Microalb Creat Ratio: 26 mcg/mg creat (ref <30)
Microalb, Ur: 3.4 mg/dL

## 2022-12-22 LAB — LIPID PANEL
Cholesterol: 114 mg/dL (ref <200)
HDL: 35 mg/dL — ABNORMAL LOW (ref >=40)
LDL Cholesterol (Calc): 65 mg/dL (calc)
Non-HDL Cholesterol (Calc): 79 mg/dL (calc) (ref <130)
Total CHOL/HDL Ratio: 3.3 (calc) (ref <5.0)
Triglycerides: 63 mg/dL (ref <150)

## 2022-12-22 LAB — HEMOGLOBIN A1C
Hgb A1c MFr Bld: 6.3 % of total Hgb — ABNORMAL HIGH (ref <5.7)
Mean Plasma Glucose: 134 mg/dL
eAG (mmol/L): 7.4 mmol/L

## 2022-12-22 LAB — TSH: TSH: 5.81 mIU/L — ABNORMAL HIGH (ref 0.40–4.50)

## 2022-12-22 LAB — VITAMIN B12: Vitamin B-12: 828 pg/mL (ref 200–1100)

## 2022-12-22 LAB — PSA: PSA: 74.16 ng/mL — ABNORMAL HIGH (ref <=4.00)

## 2022-12-22 NOTE — Progress Notes (Signed)
   12/22/2022 Name: Terry [name] MRN: [id number] DOB: 1941-07-23   Outreach:  Unsuccessful telephone call attempt #1 to patient.   Unable to leave message  Plan:  -Will route to CMA for rescheduling if needed     Regina Eck, PharmD, BCPS, BCACP Clinical Pharmacist, [place]  [facility]  T [phone]

## 2022-12-22 NOTE — Telephone Encounter (Signed)
-----   Message from Sindy Guadeloupe, MD sent at 12/22/2022  8:26 AM EST ----- Hello Dr. Dennard Schaumann, This patient has not followed up with me over 1 year now. I see that his psa is worse. My team will get in touch with him to see if he is interested in re- establishing care. Anderson Malta- can you please look into it? Thank you, Dr. Randa Evens

## 2022-12-22 NOTE — Telephone Encounter (Signed)
VM left with patient. Requested he call back to re-establish care with Dr. Janese Banks.   (Of note:  Dr. Janese Banks concerned about PSA labs and pt has no showed)

## 2022-12-24 ENCOUNTER — Ambulatory Visit (INDEPENDENT_AMBULATORY_CARE_PROVIDER_SITE_OTHER): Payer: Medicare Other | Admitting: Family Medicine

## 2022-12-24 ENCOUNTER — Encounter: Payer: Self-pay | Admitting: Family Medicine

## 2022-12-24 VITALS — BP 132/80 | HR 88 | Temp 98.1°F | Ht 67.0 in | Wt 314.0 lb

## 2022-12-24 DIAGNOSIS — C61 Malignant neoplasm of prostate: Secondary | ICD-10-CM | POA: Diagnosis not present

## 2022-12-24 DIAGNOSIS — I509 Heart failure, unspecified: Secondary | ICD-10-CM

## 2022-12-24 DIAGNOSIS — E079 Disorder of thyroid, unspecified: Secondary | ICD-10-CM

## 2022-12-24 DIAGNOSIS — I10 Essential (primary) hypertension: Secondary | ICD-10-CM

## 2022-12-24 DIAGNOSIS — N1832 Chronic kidney disease, stage 3b: Secondary | ICD-10-CM

## 2022-12-24 DIAGNOSIS — C7951 Secondary malignant neoplasm of bone: Secondary | ICD-10-CM

## 2022-12-24 DIAGNOSIS — E039 Hypothyroidism, unspecified: Secondary | ICD-10-CM

## 2022-12-24 MED ORDER — LEVOTHYROXINE SODIUM 50 MCG PO TABS: 50.0000 ug | ORAL_TABLET | Freq: Every day | ORAL | 3 refills | Status: DC

## 2022-12-24 MED ORDER — CEPHALEXIN 500 MG PO CAPS: 500.0000 mg | ORAL_CAPSULE | Freq: Three times a day (TID) | ORAL | 0 refills | Status: DC

## 2022-12-24 MED ORDER — FUROSEMIDE 40 MG PO TABS: 40.0000 mg | ORAL_TABLET | Freq: Every day | ORAL | 3 refills | Status: DC

## 2022-12-24 NOTE — Progress Notes (Signed)
Subjective:    Patient ID: Terry [name], male    DOB: 1941-04-23, 82 y.o.   MRN: [id number]  Patient has a history of a retained right ureteral stent right-sided hydronephrosis, CHF with preserved EF,  and chronic kidney disease.  He also has a history of metastatic prostate cancer.  He also has a history of noncompliance with medication therapy.  Non compliance continues to be a tremendous issue for this patient.  Has not followed up with oncology.  On recent labs PSA has risen substantially.  Was 43 (2021) and now 54.  Due for shingrix, prevnar 20, and covid booster. Immunization History  Administered Date(s) Administered   Fluad Quad(high Dose 65+) 10/26/2020, 08/27/2021, 08/31/2022   Influenza Split 10/17/2012, 10/03/2017   PFIZER(Purple Top)SARS-COV-2 Vaccination 10/27/2020   Pneumococcal Polysaccharide-23 10/17/2012, 10/26/2020    Lab on 12/21/2022  Component Date Value Ref Range Status   WBC 12/21/2022 5.8  3.8 - 10.8 Thousand/uL Final   RBC 12/21/2022 5.23  4.20 - 5.80 Million/uL Final   Hemoglobin 12/21/2022 12.6 (L)  13.2 - 17.1 g/dL Final   HCT 12/21/2022 41.3  38.5 - 50.0 % Final   MCV 12/21/2022 79.0 (L)  80.0 - 100.0 fL Final   MCH 12/21/2022 24.1 (L)  27.0 - 33.0 pg Final   MCHC 12/21/2022 30.5 (L)  32.0 - 36.0 g/dL Final   RDW 12/21/2022 15.2 (H)  11.0 - 15.0 % Final   Platelets 12/21/2022 200  140 - 400 Thousand/uL Final   MPV 12/21/2022 10.5  7.5 - 12.5 fL Final   Neutro Abs 12/21/2022 4,002  1,500 - 7,800 cells/uL Final   Lymphs Abs 12/21/2022 916  850 - 3,900 cells/uL Final   Absolute Monocytes 12/21/2022 667  200 - 950 cells/uL Final   Eosinophils Absolute 12/21/2022 174  15 - 500 cells/uL Final   Basophils Absolute 12/21/2022 41  0 - 200 cells/uL Final   Neutrophils Relative % 12/21/2022 69  % Final   Total Lymphocyte 12/21/2022 15.8  % Final   Monocytes Relative 12/21/2022 11.5  % Final   Eosinophils Relative 12/21/2022 3.0  % Final   Basophils Relative  12/21/2022 0.7  % Final   Glucose, Bld 12/21/2022 100 (H)  65 - 99 mg/dL Final   Comment: .            Fasting reference interval . For someone without known diabetes, a glucose value between 100 and 125 mg/dL is consistent with prediabetes and should be confirmed with a follow-up test. .    BUN 12/21/2022 28 (H)  7 - 25 mg/dL Final   Creat 12/21/2022 1.75 (H)  0.70 - 1.22 mg/dL Final   eGFR 12/21/2022 39 (L)  > OR = 60 mL/min/1.48m Final   BUN/Creatinine Ratio 12/21/2022 16  6 - 22 (calc) Final   Sodium 12/21/2022 140  135 - 146 mmol/L Final   Potassium 12/21/2022 4.3  3.5 - 5.3 mmol/L Final   Chloride 12/21/2022 106  98 - 110 mmol/L Final   CO2 12/21/2022 22  20 - 32 mmol/L Final   Calcium 12/21/2022 9.9  8.6 - 10.3 mg/dL Final   Total Protein 12/21/2022 8.1  6.1 - 8.1 g/dL Final   Albumin 12/21/2022 3.8  3.6 - 5.1 g/dL Final   Globulin 12/21/2022 4.3 (H)  1.9 - 3.7 g/dL (calc) Final   AG Ratio 12/21/2022 0.9 (L)  1.0 - 2.5 (calc) Final   Total Bilirubin 12/21/2022 0.3  0.2 - 1.2 mg/dL Final  Alkaline phosphatase (APISO) 12/21/2022 73  35 - 144 U/L Final   AST 12/21/2022 13  10 - 35 U/L Final   ALT 12/21/2022 7 (L)  9 - 46 U/L Final   Hgb A1c MFr Bld 12/21/2022 6.3 (H)  <5.7 % of total Hgb Final   Comment: For someone without known diabetes, a hemoglobin  A1c value between 5.7% and 6.4% is consistent with prediabetes and should be confirmed with a  follow-up test. . For someone with known diabetes, a value <7% indicates that their diabetes is well controlled. A1c targets should be individualized based on duration of diabetes, age, comorbid conditions, and other considerations. . This assay result is consistent with an increased risk of diabetes. . Currently, no consensus exists regarding use of hemoglobin A1c for diagnosis of diabetes for children. .    Mean Plasma Glucose 12/21/2022 134  mg/dL Final   eAG (mmol/L) 12/21/2022 7.4  mmol/L Final   Comment: . HbA1c  performed on Roche platform. Effective 08/23/22 a change in test platforms may have  shifted HbA1c results compared to historical results.    Cholesterol 12/21/2022 114  <200 mg/dL Final   HDL 12/21/2022 35 (L)  > OR = 40 mg/dL Final   Triglycerides 12/21/2022 63  <150 mg/dL Final   LDL Cholesterol (Calc) 12/21/2022 65  mg/dL (calc) Final   Comment: Reference range: <100 . Desirable range <100 mg/dL for primary prevention;   <70 mg/dL for patients with CHD or diabetic patients  with > or = 2 CHD risk factors. Marland Kitchen LDL-C is now calculated using the Martin-Hopkins  calculation, which is a validated novel method providing  better accuracy than the Friedewald equation in the  estimation of LDL-C.  Cresenciano Genre et al. Annamaria Helling. [id number]): 2061-2068  (http://education.[facility].com/faq/FAQ164)    Total CHOL/HDL Ratio 12/21/2022 3.3  <5.0 (calc) Final   Non-HDL Cholesterol (Calc) 12/21/2022 79  <130 mg/dL (calc) Final   Comment: For patients with diabetes plus 1 major ASCVD risk  factor, treating to a non-HDL-C goal of <100 mg/dL  (LDL-C of <70 mg/dL) is considered a therapeutic  option.    Creatinine, Urine 12/21/2022 129  20 - 320 mg/dL Final   Microalb, Ur 12/21/2022 3.4  mg/dL Final   Comment: Reference Range Not established    Microalb Creat Ratio 12/21/2022 26  <30 mcg/mg creat Final   Comment: . The ADA defines abnormalities in albumin excretion as follows: Marland Kitchen Albuminuria Category        Result (mcg/mg creatinine) . Normal to Mildly increased   <30 Moderately increased         30-299  Severely increased           > OR = 300 . The ADA recommends that at least two of three specimens collected within a 3-6 month period be abnormal before considering a patient to be within a diagnostic category.    Vitamin B-12 12/21/2022 828  200 - 1,100 pg/mL Final   TSH 12/21/2022 5.81 (H)  0.40 - 4.50 mIU/L Final   PSA 12/21/2022 74.16 (H)  < OR = 4.00 ng/mL Final   Comment: The  total PSA value from this assay system is  standardized against the WHO standard. The test  result will be approximately 20% lower when compared  to the equimolar-standardized total PSA (Beckman  Coulter). Comparison of serial PSA results should be  interpreted with this fact in mind. . This test was performed using the Siemens  chemiluminescent method. Values obtained from  different assay methods cannot be used interchangeably. PSA levels, regardless of value, should not be interpreted as absolute evidence of the presence or absence of disease.   Patient has a history of lymphedema.  Both legs are extremely swollen.  The skin over both legs is split open and several small places.  Each ulcer is roughly the diameter of a dime.  The skin is extremely erythematous and very warm to the touch.  The erythema extends up to his knees bilaterally.  Left is worse than the right.  He reports feeling weak and sick.  He denies any chest pain shortness of breath or dyspnea on exertion.  He states not taking his furosemide.  Not taking his levothyroxine.  He states he is taking his own medication but he does not have it with him Hello yes ma'am  Past Medical History:  Diagnosis Date   Arthritis    Cancer ([place])    Phreesia 08/04/2020   CHF, chronic (HCC)    diastolic   CKD (chronic kidney disease) stage 3, GFR 30-59 ml/min (HCC)    Diabetes mellitus without complication (HCC)    Dysrhythmia    Hypertension    Hypothyroidism    Lymphedema    Paroxysmal atrial fibrillation (HCC)    Prostate cancer [facility])    Renal insufficiency    Past Surgical History:  Procedure Laterality Date   COLON SURGERY N/A    Phreesia 08/04/2020   Medication list is uncertain due to non compliance   Allergies  Allergen Reactions   Lisinopril     FACIAL SWELLING   Social History   Socioeconomic History   Marital status: Widowed    Spouse name: Not on file   Number of children: 2   Years of education: 43    Highest education level: 12th grade  Occupational History   Not on file  Tobacco Use   Smoking status: Never    Passive exposure: Never   Smokeless tobacco: Never  Vaping Use   Vaping Use: Never used  Substance and Sexual Activity   Alcohol use: Yes    Comment: rarely   Drug use: Yes    Types: Marijuana   Sexual activity: Not Currently  Other Topics Concern   Not on file  Social History Narrative   Lives with son. Daughter lives in [place].   Social Determinants of Health   Financial Resource Strain: Low Risk  (12/17/2022)   Overall Financial Resource Strain (CARDIA)    Difficulty of Paying Living Expenses: Not very hard  Food Insecurity: No Food Insecurity (12/17/2022)   Hunger Vital Sign    Worried About Running Out of Food in the Last Year: Never true    Ran Out of Food in the Last Year: Never true  Transportation Needs: No Transportation Needs (12/17/2022)   PRAPARE - Hydrologist (Medical): No    Lack of Transportation (Non-Medical): No  Physical Activity: Insufficiently Active (12/17/2022)   Exercise Vital Sign    Days of Exercise per Week: 2 days    Minutes of Exercise per Session: 10 min  Stress: No Stress Concern Present (12/17/2022)   [place]    Feeling of Stress : Not at all  Social Connections: Socially Isolated (12/17/2022)   Social Connection and Isolation Panel [NHANES]    Frequency of Communication with Friends and Family: More than three times a week    Frequency of Social Gatherings with Friends  and Family: Never    Attends Religious Services: Never    Active Member of Clubs or Organizations: No    Attends Archivist Meetings: Never    Marital Status: Widowed  Intimate Partner Violence: Not At Risk (12/17/2022)   Humiliation, Afraid, Rape, and Kick questionnaire    Fear of Current or Ex-Partner: No    Emotionally Abused: No    Physically Abused: No    Sexually  Abused: No    Past Medical History:  Diagnosis Date   Arthritis    Cancer ([place])    Phreesia 08/04/2020   CHF, chronic (HCC)    diastolic   CKD (chronic kidney disease) stage 3, GFR 30-59 ml/min (HCC)    Diabetes mellitus without complication (HCC)    Dysrhythmia    Hypertension    Hypothyroidism    Lymphedema    Paroxysmal atrial fibrillation (HCC)    Prostate cancer ([place])    Renal insufficiency       Review of Systems     Objective:   Physical Exam Vitals reviewed.  Constitutional:      General: He is not in acute distress.    Appearance: He is obese. He is not ill-appearing, toxic-appearing or diaphoretic.  HENT:     Head: Normocephalic and atraumatic.     Nose: Nose normal. No congestion or rhinorrhea.     Mouth/Throat:     Pharynx: No oropharyngeal exudate or posterior oropharyngeal erythema.  Eyes:     Extraocular Movements: Extraocular movements intact.     Conjunctiva/sclera: Conjunctivae normal.     Pupils: Pupils are equal, round, and reactive to light.  Cardiovascular:     Rate and Rhythm: Normal rate and regular rhythm.     Heart sounds: Normal heart sounds. No murmur heard.    No friction rub. No gallop.  Pulmonary:     Effort: Pulmonary effort is normal. No respiratory distress.     Breath sounds: Normal breath sounds. No stridor. No wheezing, rhonchi or rales.  Chest:     Chest wall: No tenderness.  Abdominal:     General: Abdomen is flat. Bowel sounds are normal. There is no distension.     Palpations: Abdomen is soft. There is no mass.     Tenderness: There is no abdominal tenderness. There is no guarding or rebound.     Hernia: No hernia is present.  Musculoskeletal:        General: Swelling present.     Right lower leg: Edema present.     Left lower leg: Edema present.  Skin:    Findings: Erythema present.  Neurological:     General: No focal deficit present.     Mental Status: He is alert and oriented to person, place, and time. Mental  status is at baseline.     Cranial Nerves: No cranial nerve deficit.     Sensory: No sensory deficit.     Motor: No weakness.     Coordination: Coordination normal.     Gait: Gait normal.       Assessment & Plan:   Prostate cancer metastatic to bone [facility])  Chronic congestive heart failure, unspecified heart failure type (HCC)  Essential hypertension  Stage 3b chronic kidney disease (HCC)  Hypothyroidism, unspecified type Patient has cellulitis in both legs.  Begin Keflex 500 g 3 times daily 7 days.  He is retaining a fluid overload which led to the cellulitis.  Start back on Lasix 40 mg daily.  Encouraged the  patient to take his Synthroid as prescribed as his TSH is elevated.  His renal function is slightly worse however his A1c is acceptable at 6.3.  I gave the patient the contact information for his oncologist and also given contact information to his daughter and instructed them to call immediately and schedule an appointment as his oncologist has already reached out to him to try to schedule follow-up.  I will see the patient back on Monday and at that time I plan to apply Unna boots

## 2022-12-27 ENCOUNTER — Ambulatory Visit: Payer: Medicare Other | Admitting: Family Medicine

## 2022-12-28 ENCOUNTER — Inpatient Hospital Stay: Payer: Medicare Other

## 2022-12-28 ENCOUNTER — Encounter: Payer: Self-pay | Admitting: Oncology

## 2022-12-28 ENCOUNTER — Inpatient Hospital Stay: Payer: Medicare Other | Attending: Oncology | Admitting: Oncology

## 2022-12-28 VITALS — BP 178/94 | HR 76 | Temp 98.3°F | Resp 18 | Wt 311.4 lb

## 2022-12-28 DIAGNOSIS — Z79899 Other long term (current) drug therapy: Secondary | ICD-10-CM | POA: Diagnosis not present

## 2022-12-28 DIAGNOSIS — Z79818 Long term (current) use of other agents affecting estrogen receptors and estrogen levels: Secondary | ICD-10-CM

## 2022-12-28 DIAGNOSIS — Z7189 Other specified counseling: Secondary | ICD-10-CM | POA: Diagnosis not present

## 2022-12-28 DIAGNOSIS — C61 Malignant neoplasm of prostate: Secondary | ICD-10-CM | POA: Insufficient documentation

## 2022-12-28 DIAGNOSIS — Z5111 Encounter for antineoplastic chemotherapy: Secondary | ICD-10-CM | POA: Insufficient documentation

## 2022-12-28 DIAGNOSIS — C7951 Secondary malignant neoplasm of bone: Secondary | ICD-10-CM | POA: Diagnosis present

## 2022-12-28 DIAGNOSIS — Z5181 Encounter for therapeutic drug level monitoring: Secondary | ICD-10-CM

## 2022-12-28 MED ORDER — LEUPROLIDE ACETATE (6 MONTH) 45 MG ~~LOC~~ KIT
45.0000 mg | PACK | SUBCUTANEOUS | Status: AC
  Administered 2022-12-28: 45 mg via SUBCUTANEOUS
  Filled 2022-12-28: qty 45

## 2022-12-28 NOTE — Progress Notes (Signed)
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  He has had a history of colon cancer in the past and has undergone surgery for that as well.  He was being treated for metastatic prostate cancer in 2018 in [place] by Dr. Vedia Coffer.  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  While he was off ADT his PSA increased to 41 in September 2021.  He was given loading dose of Firmagon in urology clinic but subsequently did not follow-up.  Seen by them recently on 08/20/2021 and repeat PSA at that time was 32.  Patient also has a history of right ureteral stent placement for proximal ureteral stone.  Options for potential stent exchange or removal was discussed but he was considered high risk and consideration will be given in the future if he has repeat UTIs.  He has been sent to oncology for further management of his prostate cancer.     Patient was given Eligard in February 2022 and then again in October 2022 as he missed a dose in between.  He did not follow-up with me since then.  CT chest abdomen and pelvis with contrast in November  2022 showed skeletal metastatic disease with lesion noted on T2 vertebra which is likely a chronic metastatic lesion.  Lesions in L5 vertebra, right sacral ala and right ischium which are chronic.  Sacral lesion was mildly increased as compared to 2021 suggesting minimal progression.  Mild inguinal and prominent pelvic lymph nodes no other evidence of metastatic disease.  Bone scan showed mild activity corresponding to sclerotic lesions in L5 vertebral body and right ischium presumed metastatic  Interval history-patient tells me that he did not see me after November 2022 given his transportation issues.  He depends on his son to bring him for his medical appointments and he has not been able to follow-up regularly.  He is in [place] and prefers getting treatments there.  ECOG PS- 2-3 Pain scale- 0  Review of systems- Review of Systems  Constitutional:  Positive for malaise/fatigue. Negative for chills, fever and weight loss.  HENT:  Negative for congestion, ear discharge and nosebleeds.   Eyes:  Negative for blurred vision.  Respiratory:  Negative for cough, hemoptysis, sputum production, shortness of breath and wheezing.   Cardiovascular:  Negative for chest pain, palpitations, orthopnea and claudication.  Gastrointestinal:  Negative for abdominal pain, blood in stool, constipation, diarrhea, heartburn, melena, nausea and vomiting.  Genitourinary:  Negative for dysuria, flank pain, frequency, hematuria and urgency.  Musculoskeletal:  Negative for back pain, joint pain and myalgias.  Skin:  Negative for rash.  Neurological:  Negative for dizziness, tingling, focal weakness, seizures, weakness and headaches.  Endo/Heme/Allergies:  Does not bruise/bleed easily.  Psychiatric/Behavioral:  Negative for depression  and suicidal ideas. The patient does not have insomnia.       Allergies  Allergen Reactions   Lisinopril     FACIAL SWELLING     Past Medical History:  Diagnosis Date    Arthritis    Cancer ([place])    Phreesia 08/04/2020   CHF, chronic (HCC)    diastolic   CKD (chronic kidney disease) stage 3, GFR 30-59 ml/min (HCC)    Diabetes mellitus without complication (HCC)    Dysrhythmia    Hypertension    Hypothyroidism    Lymphedema    Paroxysmal atrial fibrillation (HCC)    Prostate cancer [facility])    Renal insufficiency      Past Surgical History:  Procedure Laterality Date   COLON SURGERY N/A    Phreesia 08/04/2020    Social History   Socioeconomic History   Marital status: Widowed    Spouse name: Not on file   Number of children: 2   Years of education: 85   Highest education level: 12th grade  Occupational History   Not on file  Tobacco Use   Smoking status: Never    Passive exposure: Never   Smokeless tobacco: Never  Vaping Use   Vaping Use: Never used  Substance and Sexual Activity   Alcohol use: Yes    Comment: rarely   Drug use: Yes    Types: Marijuana   Sexual activity: Not Currently  Other Topics Concern   Not on file  Social History Narrative   Lives with son. Daughter lives in [place].   Social Determinants of Health   Financial Resource Strain: Low Risk  (12/17/2022)   Overall Financial Resource Strain (CARDIA)    Difficulty of Paying Living Expenses: Not very hard  Food Insecurity: No Food Insecurity (12/17/2022)   Hunger Vital Sign    Worried About Running Out of Food in the Last Year: Never true    Ran Out of Food in the Last Year: Never true  Transportation Needs: No Transportation Needs (12/17/2022)   PRAPARE - Hydrologist (Medical): No    Lack of Transportation (Non-Medical): No  Physical Activity: Insufficiently Active (12/17/2022)   Exercise Vital Sign    Days of Exercise per Week: 2 days    Minutes of Exercise per Session: 10 min  Stress: No Stress Concern Present (12/17/2022)   [place]    Feeling of Stress : Not at all   Social Connections: Socially Isolated (12/17/2022)   Social Connection and Isolation Panel [NHANES]    Frequency of Communication with Friends and Family: More than three times a week    Frequency of Social Gatherings with Friends and Family: Never    Attends Religious Services: Never    Marine scientist or Organizations: No    Attends Archivist Meetings: Never    Marital Status: Widowed  Intimate Partner Violence: Not At Risk (12/17/2022)   Humiliation, Afraid, Rape, and Kick questionnaire    Fear of Current or Ex-Partner: No    Emotionally Abused: No    Physically Abused: No    Sexually Abused: No    Family History  Problem Relation Age of Onset   Hypertension Mother    Diabetes Son      Current Outpatient Medications:    allopurinol (ZYLOPRIM) 100 MG tablet, Take 0.5 tablets (50 mg total) by mouth daily., Disp: 30 tablet, Rfl: 6   apixaban (ELIQUIS) 2.5  MG TABS tablet, Take 1 tablet (2.5 mg total) by mouth 2 (two) times daily., Disp: 60 tablet, Rfl: 11   carvedilol (COREG) 6.25 MG tablet, Take 1 tablet (6.25 mg total) by mouth 2 (two) times daily with a meal., Disp: 90 tablet, Rfl: 3   cephALEXin (KEFLEX) 500 MG capsule, Take 1 capsule (500 mg total) by mouth 3 (three) times daily., Disp: 21 capsule, Rfl: 0   furosemide (LASIX) 40 MG tablet, Take 1 tablet (40 mg total) by mouth daily., Disp: 30 tablet, Rfl: 3   Iron, Ferrous Sulfate, 325 (65 Fe) MG TABS, Take 325 mg by mouth daily., Disp: 30 tablet, Rfl: 3   levothyroxine (SYNTHROID) 50 MCG tablet, Take 1 tablet (50 mcg total) by mouth daily., Disp: 90 tablet, Rfl: 3   losartan (COZAAR) 50 MG tablet, Take 1 tablet (50 mg total) by mouth daily., Disp: 90 tablet, Rfl: 3   Multiple Vitamins-Minerals (MULTIVITAMINS THER. W/MINERALS) TABS, Take 1 tablet by mouth daily., Disp: , Rfl:    spironolactone (ALDACTONE) 25 MG tablet, Take 1 tablet (25 mg total) by mouth daily., Disp: 90 tablet, Rfl: 3 No current  facility-administered medications for this visit.  Facility-Administered Medications Ordered in Other Visits:    leuprolide (6 Month) (ELIGARD) injection 45 mg, 45 mg, Subcutaneous, Q6 months, Sindy Guadeloupe, MD, 45 mg at 12/28/22 1549  Physical exam:  Vitals:   12/28/22 1507  BP: (!) 178/94  Pulse: 76  Resp: 18  Temp: 98.3 F (36.8 C)  TempSrc: Tympanic  SpO2: 97%  Weight: (!) 311 lb 6.4 oz (141.3 kg)   Physical Exam Constitutional:      Appearance: He is obese.     Comments: Sitting in a wheelchair.  Appears in no acute distress  Cardiovascular:     Rate and Rhythm: Normal rate and regular rhythm.     Heart sounds: Normal heart sounds.  Pulmonary:     Effort: Pulmonary effort is normal.     Breath sounds: Normal breath sounds.  Abdominal:     General: Bowel sounds are normal.     Palpations: Abdomen is soft.  Musculoskeletal:     Cervical back: Normal range of motion.     Comments: Bilateral +1 edema  Skin:    General: Skin is warm and dry.  Neurological:     Mental Status: He is alert and oriented to person, place, and time.         Latest Ref Rng & Units 12/21/2022    9:59 AM  CMP  Glucose 65 - 99 mg/dL 100   BUN 7 - 25 mg/dL 28   Creatinine 0.70 - 1.22 mg/dL 1.75   Sodium 135 - 146 mmol/L 140   Potassium 3.5 - 5.3 mmol/L 4.3   Chloride 98 - 110 mmol/L 106   CO2 20 - 32 mmol/L 22   Calcium 8.6 - 10.3 mg/dL 9.9   Total Protein 6.1 - 8.1 g/dL 8.1   Total Bilirubin 0.2 - 1.2 mg/dL 0.3   AST 10 - 35 U/L 13   ALT 9 - 46 U/L 7       Latest Ref Rng & Units 12/21/2022    9:59 AM  CBC  WBC 3.8 - 10.8 Thousand/uL 5.8   Hemoglobin 13.2 - 17.1 g/dL 12.6   Hematocrit 38.5 - 50.0 % 41.3   Platelets 140 - 400 Thousand/uL 200      Assessment and plan- Patient is a 82 y.o. male with metastatic castrate sensitive prostate  cancer with bone metastases here to reestablish follow-up  Patient last received Lupron in October 2022.  He did not follow-up with me since  then.  His PSA back in October 2022 was 39 and presently it is elevated at 74.  No PSAs were checked in between due to loss of follow-up.  I will plan to give him his Lupron today.  We will plan to get a PSMA PET scan to a certain the extent of his disease.  If he has evidence of progressive disease as compared to his prior scan it would be worthwhile to add additional oral antiandrogens such as Xtandi or [place].  Patient lives in [place] and prefers to get care closer to his home.  I will therefore have him see one of the medical oncologists at [facility] after he gets his PSMA PET scan.  He will get repeat labs at that time including CBC with differential CMP and PSA.  There is evidence of mild microcytic anemia on his labs and will plan to check ferritin and iron studies on that day as well.  No follow-up with me required as long as he is able to see somebody in [place]   Visit Diagnosis 1. Prostate cancer metastatic to bone ([place])   2. Goals of care, counseling/discussion   3. Encounter for monitoring Lupron therapy      Dr. Randa Evens, MD, MPH [facility] [id number] 12/28/2022 4:34 PM

## 2022-12-29 ENCOUNTER — Telehealth: Payer: Self-pay | Admitting: Hematology

## 2022-12-29 ENCOUNTER — Telehealth: Payer: Self-pay | Admitting: Oncology

## 2022-12-29 NOTE — Telephone Encounter (Signed)
Called this morning left vm to schedule pt to have psma pet scan at [facility] long. He is a transfer pt. Called this afternoon because I still hadn't heard back. Did not leave a second vm. Will try again tomorrow morning.

## 2022-12-29 NOTE — Telephone Encounter (Signed)
Scheduled appt per 2/13 referral. Pt is aware of appt date and time. Pt is aware to arrive 15 mins prior to appt time and to bring and updated insurance card. Pt is aware of appt location.

## 2022-12-30 ENCOUNTER — Telehealth: Payer: Self-pay | Admitting: Oncology

## 2022-12-30 ENCOUNTER — Telehealth: Payer: Self-pay

## 2022-12-30 NOTE — Telephone Encounter (Signed)
Left vm with daughter of pt about his scheduled psma at [facility] long on 2/29

## 2022-12-30 NOTE — Progress Notes (Signed)
   Care Guide Note  12/30/2022 Name: Keysean Savino MRN: [id number] DOB: 22-Oct-1941  Referred by: Susy Frizzle, MD Reason for referral : Care Coordination (Outreach to reschedule missed initial with Pharm d )   Deondray Ospina is a 82 y.o. year old male who is a primary care patient of Dennard Schaumann, [name], MD. Rogers Seeds Hovatter was referred to the pharmacist for assistance related to CHF.    An unsuccessful telephone outreach was attempted today to contact the patient who was referred to the pharmacy team for assistance with medication management. Additional attempts will be made to contact the patient.   Noreene Larsson, [place], [place] [address] Direct Dial: [phone] [email or web address]

## 2022-12-30 NOTE — Telephone Encounter (Signed)
Left vm with pt about his scheduled psma at [facility] long on 2/29

## 2023-01-03 ENCOUNTER — Ambulatory Visit (INDEPENDENT_AMBULATORY_CARE_PROVIDER_SITE_OTHER): Payer: Medicare Other | Admitting: Family Medicine

## 2023-01-03 VITALS — BP 136/86 | HR 92 | Temp 98.1°F | Ht 67.0 in | Wt 314.8 lb

## 2023-01-03 DIAGNOSIS — I890 Lymphedema, not elsewhere classified: Secondary | ICD-10-CM

## 2023-01-03 NOTE — Progress Notes (Signed)
Subjective:    Patient ID: Terry [name], male    DOB: [birth year], 82 y.o.   MRN: [id number] 12/24/22 Patient has a history of a retained right ureteral stent right-sided hydronephrosis, CHF with preserved EF,  and chronic kidney disease.  He also has a history of metastatic prostate cancer.  He also has a history of noncompliance with medication therapy.  Non compliance continues to be a tremendous issue for this patient.  Has not followed up with oncology.  On recent labs PSA has risen substantially.  Was 8 (2021) and now 4.  Due for shingrix, prevnar 20, and covid booster. Immunization History  Administered Date(s) Administered   Fluad Quad(high Dose 65+) 10/26/2020, 08/27/2021, 08/31/2022   Influenza Split 10/17/2012, 10/03/2017   PFIZER(Purple Top)SARS-COV-2 Vaccination 10/27/2020   Pneumococcal Polysaccharide-23 10/17/2012, 10/26/2020    Patient has a history of lymphedema.  Both legs are extremely swollen.  The skin over both legs is split open and several small places.  Each ulcer is roughly the diameter of a dime.  The skin is extremely erythematous and very warm to the touch.  The erythema extends up to his knees bilaterally.  Left is worse than the right.  He reports feeling weak and sick.  He denies any chest pain shortness of breath or dyspnea on exertion.  He states not taking his furosemide.  Not taking his levothyroxine.  He states he is taking his own medication but he does not have it with him.  At that time, my plan was:  Patient has cellulitis in both legs.  Begin Keflex 500 g 3 times daily 7 days.  He is retaining a fluid overload which led to the cellulitis.  Start back on Lasix 40 mg daily.  Encouraged the patient to take his Synthroid as prescribed as his TSH is elevated.  His renal function is slightly worse however his A1c is acceptable at 6.3.  I gave the patient the contact information for his oncologist and also given contact information to his daughter and instructed  them to call immediately and schedule an appointment as his oncologist has already reached out to him to try to schedule follow-up.  I will see the patient back on Monday and at that time I plan to apply Unna boots  01/03/23 Patient is here today for follow-up.  He missed his last appointment.  The erythema has faded on both legs.  They are not warm or tender to the touch.  He still has a weeping ulcer over his right lateral malleolus and right anterior shin.  There is also a mottled reticular appearance to the skin over his right shin.  However he has palpable dorsalis pedis and posterior tibialis pulses in both feet with no evidence of ischemia.  He denies any claudication.  He still has +3 pitting edema in both legs.  Past Medical History:  Diagnosis Date   Arthritis    Cancer ([place])    Phreesia 08/04/2020   CHF, chronic (HCC)    diastolic   CKD (chronic kidney disease) stage 3, GFR 30-59 ml/min (HCC)    Diabetes mellitus without complication (HCC)    Dysrhythmia    Hypertension    Hypothyroidism    Lymphedema    Paroxysmal atrial fibrillation (HCC)    Prostate cancer [facility])    Renal insufficiency    Past Surgical History:  Procedure Laterality Date   COLON SURGERY N/A    Phreesia 08/04/2020   Medication list is uncertain due to non  compliance   Allergies  Allergen Reactions   Lisinopril     FACIAL SWELLING   Social History   Socioeconomic History   Marital status: Widowed    Spouse name: Not on file   Number of children: 2   Years of education: 59   Highest education level: 12th grade  Occupational History   Not on file  Tobacco Use   Smoking status: Never    Passive exposure: Never   Smokeless tobacco: Never  Vaping Use   Vaping Use: Never used  Substance and Sexual Activity   Alcohol use: Yes    Comment: rarely   Drug use: Yes    Types: Marijuana   Sexual activity: Not Currently  Other Topics Concern   Not on file  Social History Narrative   Lives with  son. Daughter lives in [place].   Social Determinants of Health   Financial Resource Strain: Low Risk  (12/17/2022)   Overall Financial Resource Strain (CARDIA)    Difficulty of Paying Living Expenses: Not very hard  Food Insecurity: No Food Insecurity (12/17/2022)   Hunger Vital Sign    Worried About Running Out of Food in the Last Year: Never true    Ran Out of Food in the Last Year: Never true  Transportation Needs: No Transportation Needs (12/17/2022)   PRAPARE - Hydrologist (Medical): No    Lack of Transportation (Non-Medical): No  Physical Activity: Insufficiently Active (12/17/2022)   Exercise Vital Sign    Days of Exercise per Week: 2 days    Minutes of Exercise per Session: 10 min  Stress: No Stress Concern Present (12/17/2022)   [place]    Feeling of Stress : Not at all  Social Connections: Socially Isolated (12/17/2022)   Social Connection and Isolation Panel [NHANES]    Frequency of Communication with Friends and Family: More than three times a week    Frequency of Social Gatherings with Friends and Family: Never    Attends Religious Services: Never    Marine scientist or Organizations: No    Attends Archivist Meetings: Never    Marital Status: Widowed  Intimate Partner Violence: Not At Risk (12/17/2022)   Humiliation, Afraid, Rape, and Kick questionnaire    Fear of Current or Ex-Partner: No    Emotionally Abused: No    Physically Abused: No    Sexually Abused: No    Past Medical History:  Diagnosis Date   Arthritis    Cancer ([place])    Phreesia 08/04/2020   CHF, chronic (HCC)    diastolic   CKD (chronic kidney disease) stage 3, GFR 30-59 ml/min (HCC)    Diabetes mellitus without complication (HCC)    Dysrhythmia    Hypertension    Hypothyroidism    Lymphedema    Paroxysmal atrial fibrillation (HCC)    Prostate cancer ([place])    Renal insufficiency        Review of Systems     Objective:   Physical Exam Vitals reviewed.  Constitutional:      General: He is not in acute distress.    Appearance: He is obese. He is not ill-appearing, toxic-appearing or diaphoretic.  HENT:     Head: Normocephalic and atraumatic.     Nose: Nose normal. No congestion or rhinorrhea.     Mouth/Throat:     Pharynx: No oropharyngeal exudate or posterior oropharyngeal erythema.  Eyes:  Extraocular Movements: Extraocular movements intact.     Conjunctiva/sclera: Conjunctivae normal.     Pupils: Pupils are equal, round, and reactive to light.  Cardiovascular:     Rate and Rhythm: Normal rate and regular rhythm.     Heart sounds: Normal heart sounds. No murmur heard.    No friction rub. No gallop.  Pulmonary:     Effort: Pulmonary effort is normal. No respiratory distress.     Breath sounds: Normal breath sounds. No stridor. No wheezing, rhonchi or rales.  Chest:     Chest wall: No tenderness.  Abdominal:     General: Abdomen is flat. Bowel sounds are normal. There is no distension.     Palpations: Abdomen is soft. There is no mass.     Tenderness: There is no abdominal tenderness. There is no guarding or rebound.     Hernia: No hernia is present.  Musculoskeletal:        General: Swelling present.     Right lower leg: Edema present.     Left lower leg: Edema present.  Skin:    Findings: Erythema present.  Neurological:     General: No focal deficit present.     Mental Status: He is alert and oriented to person, place, and time. Mental status is at baseline.     Cranial Nerves: No cranial nerve deficit.     Sensory: No sensory deficit.     Motor: No weakness.     Coordination: Coordination normal.     Gait: Gait normal.       Assessment & Plan:   Lymphedema The cellulitis appears to have resolved however the patient has uncontrolled lymphedema.  Therefore I placed the patient in bilateral Unna boots today.  I will recheck the patient  on Thursday and replace the Unna boots every 3 days until the swelling has improved.  At that point I hope to transition the patient to compression stockings that he can wear at home to help prevent this in the future

## 2023-01-05 NOTE — Progress Notes (Signed)
   01/05/2023    Outreach:  Unsuccessful telephone call attempt #1 to patient.   Unable to leave message  Plan:  Will route to [place] for rescheduling  Regina Eck, PharmD, BCPS, BCACP Clinical Pharmacist, [facility]

## 2023-01-06 ENCOUNTER — Ambulatory Visit (INDEPENDENT_AMBULATORY_CARE_PROVIDER_SITE_OTHER): Payer: Medicare Other | Admitting: Family Medicine

## 2023-01-06 ENCOUNTER — Encounter: Payer: Self-pay | Admitting: Family Medicine

## 2023-01-06 VITALS — BP 136/82 | HR 72 | Temp 98.1°F | Ht 67.0 in | Wt 309.0 lb

## 2023-01-06 DIAGNOSIS — I890 Lymphedema, not elsewhere classified: Secondary | ICD-10-CM | POA: Diagnosis not present

## 2023-01-06 NOTE — Progress Notes (Signed)
Subjective:    Patient ID: Terry [name], male    DOB: December 06, 1940, 82 y.o.   MRN: [id number] 12/24/22 Patient has a history of a retained right ureteral stent right-sided hydronephrosis, CHF with preserved EF,  and chronic kidney disease.  He also has a history of metastatic prostate cancer.  He also has a history of noncompliance with medication therapy.  Non compliance continues to be a tremendous issue for this patient.  Has not followed up with oncology.  On recent labs PSA has risen substantially.  Was 65 (2021) and now 58.  Due for shingrix, prevnar 20, and covid booster. Immunization History  Administered Date(s) Administered  . Fluad Quad(high Dose 65+) 10/26/2020, 08/27/2021, 08/31/2022  . Influenza Split 10/17/2012, 10/03/2017  . PFIZER(Purple Top)SARS-COV-2 Vaccination 10/27/2020  . Pneumococcal Polysaccharide-23 10/17/2012, 10/26/2020    Patient has a history of lymphedema.  Both legs are extremely swollen.  The skin over both legs is split open and several small places.  Each ulcer is roughly the diameter of a dime.  The skin is extremely erythematous and very warm to the touch.  The erythema extends up to his knees bilaterally.  Left is worse than the right.  He reports feeling weak and sick.  He denies any chest pain shortness of breath or dyspnea on exertion.  He states not taking his furosemide.  Not taking his levothyroxine.  He states he is taking his own medication but he does not have it with him.  At that time, my plan was:  Patient has cellulitis in both legs.  Begin Keflex 500 g 3 times daily 7 days.  He is retaining a fluid overload which led to the cellulitis.  Start back on Lasix 40 mg daily.  Encouraged the patient to take his Synthroid as prescribed as his TSH is elevated.  His renal function is slightly worse however his A1c is acceptable at 6.3.  I gave the patient the contact information for his oncologist and also given contact information to his daughter and  instructed them to call immediately and schedule an appointment as his oncologist has already reached out to him to try to schedule follow-up.  I will see the patient back on Monday and at that time I plan to apply Unna boots  01/03/23 Patient is here today for follow-up.  He missed his last appointment.  The erythema has faded on both legs.  They are not warm or tender to the touch.  He still has a weeping ulcer over his right lateral malleolus and right anterior shin.  There is also a mottled reticular appearance to the skin over his right shin.  However he has palpable dorsalis pedis and posterior tibialis pulses in both feet with no evidence of ischemia.  He denies any claudication.  He still has +3 pitting edema in both legs. At that time, my plan was:  The cellulitis appears to have resolved however the patient has uncontrolled lymphedema.  Therefore I placed the patient in bilateral Unna boots today.  I will recheck the patient on Thursday and replace the Unna boots every 3 days until the swelling has improved.  At that point I hope to transition the patient to compression stockings that he can wear at home to help prevent this in the future  01/06/23 Patient presents today for follow-up.  The swelling in his legs have improved dramatically since starting the Unna boots.  The redness has faded.  The superficial scars on his right medial malleolus  and his right anterior shin have healed.  There is no further weeping edema. Past Medical History:  Diagnosis Date  . Arthritis   . Cancer ([place])    [facility]reesia 08/04/2020  . CHF, chronic (HCC)    diastolic  . CKD (chronic kidney disease) stage 3, GFR 30-59 ml/min (HCC)   . Diabetes mellitus without complication ([place])   . Dysrhythmia   . Hypertension   . Hypothyroidism   . Lymphedema   . Paroxysmal atrial fibrillation (HCC)   . Prostate cancer ([place])   . Renal insufficiency    Past Surgical History:  Procedure Laterality Date  . COLON SURGERY N/A     [facility]reesia 08/04/2020   Current Outpatient Medications on File Prior to Visit  Medication Sig Dispense Refill  . allopurinol (ZYLOPRIM) 100 MG tablet Take 0.5 tablets (50 mg total) by mouth daily. 30 tablet 6  . apixaban (ELIQUIS) 2.5 MG TABS tablet Take 1 tablet (2.5 mg total) by mouth 2 (two) times daily. 60 tablet 11  . carvedilol (COREG) 6.25 MG tablet Take 1 tablet (6.25 mg total) by mouth 2 (two) times daily with a meal. 90 tablet 3  . cephALEXin (KEFLEX) 500 MG capsule Take 1 capsule (500 mg total) by mouth 3 (three) times daily. 21 capsule 0  . furosemide (LASIX) 40 MG tablet Take 1 tablet (40 mg total) by mouth daily. 30 tablet 3  . Iron, Ferrous Sulfate, 325 (65 Fe) MG TABS Take 325 mg by mouth daily. 30 tablet 3  . levothyroxine (SYNTHROID) 50 MCG tablet Take 1 tablet (50 mcg total) by mouth daily. 90 tablet 3  . losartan (COZAAR) 50 MG tablet Take 1 tablet (50 mg total) by mouth daily. 90 tablet 3  . Multiple Vitamins-Minerals (MULTIVITAMINS THER. W/MINERALS) TABS Take 1 tablet by mouth daily.    Marland Kitchen spironolactone (ALDACTONE) 25 MG tablet Take 1 tablet (25 mg total) by mouth daily. 90 tablet 3   Current Facility-Administered Medications on File Prior to Visit  Medication Dose Route Frequency Provider Last Rate Last Admin  . leuprolide (6 Month) (ELIGARD) injection 45 mg  45 mg Subcutaneous Q6 months Sindy Guadeloupe, MD   45 mg at 12/28/22 1549      Allergies  Allergen Reactions  . Lisinopril     FACIAL SWELLING   Social History   Socioeconomic History  . Marital status: Widowed    Spouse name: Not on file  . Number of children: 2  . Years of education: 42  . Highest education level: 12th grade  Occupational History  . Not on file  Tobacco Use  . Smoking status: Never    Passive exposure: Never  . Smokeless tobacco: Never  Vaping Use  . Vaping Use: Never used  Substance and Sexual Activity  . Alcohol use: Yes    Comment: rarely  . Drug use: Yes    Types:  Marijuana  . Sexual activity: Not Currently  Other Topics Concern  . Not on file  Social History Narrative   Lives with son. Daughter lives in [place].   Social Determinants of Health   Financial Resource Strain: Low Risk  (12/17/2022)   Overall Financial Resource Strain (CARDIA)   . Difficulty of Paying Living Expenses: Not very hard  Food Insecurity: No Food Insecurity (12/17/2022)   Hunger Vital Sign   . Worried About Charity fundraiser in the Last Year: Never true   . Ran Out of Food in the Last Year: Never true  Transportation Needs: No Transportation Needs (12/17/2022)   [place] - Transportation   . Lack of Transportation (Medical): No   . Lack of Transportation (Non-Medical): No  Physical Activity: Insufficiently Active (12/17/2022)   Exercise Vital Sign   . Days of Exercise per Week: 2 days   . Minutes of Exercise per Session: 10 min  Stress: No Stress Concern Present (12/17/2022)   [place]   . Feeling of Stress : Not at all  Social Connections: Socially Isolated (12/17/2022)   Social Connection and Isolation Panel [NHANES]   . Frequency of Communication with Friends and Family: More than three times a week   . Frequency of Social Gatherings with Friends and Family: Never   . Attends Religious Services: Never   . Active Member of Clubs or Organizations: No   . Attends Archivist Meetings: Never   . Marital Status: Widowed  Intimate Partner Violence: Not At Risk (12/17/2022)   Humiliation, Afraid, Rape, and Kick questionnaire   . Fear of Current or Ex-Partner: No   . Emotionally Abused: No   . Physically Abused: No   . Sexually Abused: No    Past Medical History:  Diagnosis Date  . Arthritis   . Cancer ([place])    Phreesia 08/04/2020  . CHF, chronic (HCC)    diastolic  . CKD (chronic kidney disease) stage 3, GFR 30-59 ml/min (HCC)   . Diabetes mellitus without complication ([place])   . Dysrhythmia   .  Hypertension   . Hypothyroidism   . Lymphedema   . Paroxysmal atrial fibrillation (HCC)   . Prostate cancer ([place])   . Renal insufficiency       Review of Systems     Objective:   Physical Exam Vitals reviewed.  Constitutional:      General: He is not in acute distress.    Appearance: He is obese. He is not ill-appearing, toxic-appearing or diaphoretic.  HENT:     Head: Normocephalic and atraumatic.     Nose: Nose normal. No congestion or rhinorrhea.     Mouth/Throat:     Pharynx: No oropharyngeal exudate or posterior oropharyngeal erythema.  Eyes:     Extraocular Movements: Extraocular movements intact.     Conjunctiva/sclera: Conjunctivae normal.     Pupils: Pupils are equal, round, and reactive to light.  Cardiovascular:     Rate and Rhythm: Normal rate and regular rhythm.     Heart sounds: Normal heart sounds. No murmur heard.    No friction rub. No gallop.  Pulmonary:     Effort: Pulmonary effort is normal. No respiratory distress.     Breath sounds: Normal breath sounds. No stridor. No wheezing, rhonchi or rales.  Chest:     Chest wall: No tenderness.  Abdominal:     General: Abdomen is flat. Bowel sounds are normal. There is no distension.     Palpations: Abdomen is soft. There is no mass.     Tenderness: There is no abdominal tenderness. There is no guarding or rebound.     Hernia: No hernia is present.  Musculoskeletal:        General: Swelling present.     Right lower leg: Edema present.     Left lower leg: Edema present.  Skin:    Findings: No erythema.  Neurological:     General: No focal deficit present.     Mental Status: He is alert and oriented to person, place, and time.  Mental status is at baseline.     Cranial Nerves: No cranial nerve deficit.     Sensory: No sensory deficit.     Motor: No weakness.     Coordination: Coordination normal.     Gait: Gait normal.      Assessment & Plan:  Lymphedema I recommended transitioning to compression  wraps however the patient would like to continue to use the Unna boots 1 additional time as his legs feel much better after 1 treatment.  Therefore he will return Monday to remove the Unna boots and we will transition to compression wraps.

## 2023-01-10 ENCOUNTER — Encounter: Payer: Self-pay | Admitting: Family Medicine

## 2023-01-10 ENCOUNTER — Ambulatory Visit (INDEPENDENT_AMBULATORY_CARE_PROVIDER_SITE_OTHER): Payer: Medicare Other | Admitting: Family Medicine

## 2023-01-10 VITALS — BP 142/86 | HR 78 | Temp 98.5°F | Ht 67.0 in | Wt 310.0 lb

## 2023-01-10 DIAGNOSIS — I509 Heart failure, unspecified: Secondary | ICD-10-CM | POA: Diagnosis not present

## 2023-01-10 DIAGNOSIS — R32 Unspecified urinary incontinence: Secondary | ICD-10-CM

## 2023-01-10 DIAGNOSIS — G629 Polyneuropathy, unspecified: Secondary | ICD-10-CM | POA: Diagnosis not present

## 2023-01-10 DIAGNOSIS — I890 Lymphedema, not elsewhere classified: Secondary | ICD-10-CM

## 2023-01-10 MED ORDER — GABAPENTIN 300 MG PO CAPS: 300.0000 mg | ORAL_CAPSULE | Freq: Two times a day (BID) | ORAL | 3 refills | Status: DC | PRN

## 2023-01-10 NOTE — Progress Notes (Signed)
Subjective:    Patient ID: Terry [name], male    DOB: 1941-04-19, 82 y.o.   MRN: [id number] 12/24/22 Patient has a history of a retained right ureteral stent right-sided hydronephrosis, CHF with preserved EF,  and chronic kidney disease.  He also has a history of metastatic prostate cancer.  He also has a history of noncompliance with medication therapy.  Non compliance continues to be a tremendous issue for this patient.  Has not followed up with oncology.  On recent labs PSA has risen substantially.  Was 10 (2021) and now 52.  Due for shingrix, prevnar 20, and covid booster. Immunization History  Administered Date(s) Administered   Fluad Quad(high Dose 65+) 10/26/2020, 08/27/2021, 08/31/2022   Influenza Split 10/17/2012, 10/03/2017   PFIZER(Purple Top)SARS-COV-2 Vaccination 10/27/2020   Pneumococcal Polysaccharide-23 10/17/2012, 10/26/2020    Patient has a history of lymphedema.  Both legs are extremely swollen.  The skin over both legs is split open and several small places.  Each ulcer is roughly the diameter of a dime.  The skin is extremely erythematous and very warm to the touch.  The erythema extends up to his knees bilaterally.  Left is worse than the right.  He reports feeling weak and sick.  He denies any chest pain shortness of breath or dyspnea on exertion.  He states not taking his furosemide.  Not taking his levothyroxine.  He states he is taking his own medication but he does not have it with him.  At that time, my plan was:  Patient has cellulitis in both legs.  Begin Keflex 500 g 3 times daily 7 days.  He is retaining a fluid overload which led to the cellulitis.  Start back on Lasix 40 mg daily.  Encouraged the patient to take his Synthroid as prescribed as his TSH is elevated.  His renal function is slightly worse however his A1c is acceptable at 6.3.  I gave the patient the contact information for his oncologist and also given contact information to his daughter and instructed  them to call immediately and schedule an appointment as his oncologist has already reached out to him to try to schedule follow-up.  I will see the patient back on Monday and at that time I plan to apply Unna boots  01/03/23 Patient is here today for follow-up.  He missed his last appointment.  The erythema has faded on both legs.  They are not warm or tender to the touch.  He still has a weeping ulcer over his right lateral malleolus and right anterior shin.  There is also a mottled reticular appearance to the skin over his right shin.  However he has palpable dorsalis pedis and posterior tibialis pulses in both feet with no evidence of ischemia.  He denies any claudication.  He still has +3 pitting edema in both legs. At that time, my plan was:  The cellulitis appears to have resolved however the patient has uncontrolled lymphedema.  Therefore I placed the patient in bilateral Unna boots today.  I will recheck the patient on Thursday and replace the Unna boots every 3 days until the swelling has improved.  At that point I hope to transition the patient to compression stockings that he can wear at home to help prevent this in the future  01/06/23 Patient presents today for follow-up.  The swelling in his legs have improved dramatically since starting the Unna boots.  The redness has faded.  The superficial scars on his right medial malleolus  and his right anterior shin have healed.  There is no further weeping edema.  At that time, my plan was: I recommended transitioning to compression wraps however the patient would like to continue to use the Unna boots 1 additional time as his legs feel much better after 1 treatment.  Therefore he will return Monday to remove the Unna boots and we will transition to compression wraps.  01/10/23 Patient's legs look much better today.  There is no residual cellulitis.  The swelling is now now up to +1 bilaterally.  The Unna boots were removed.  Patient needs to  transition into more long-term compression therapy.  Patient states that he is not taking his fluid pills due to urinary incontinence.  He often cannot make it to the bathroom in time.  We discussed possibly using condom catheters and he is very interested in that.  He is post to be on fluid pills both for peripheral edema as well as for his congestive heart failure.  Third issue is the patient reports burning pain in his feet at night to keep him awake.  He states that when he is laying in bed his feet feel like they are on fire.  He will awaken him and keep him from resting.  It does not happen during the day.  He denies any claudication with ambulation. Past Medical History:  Diagnosis Date   Arthritis    Cancer ([place])    Phreesia 08/04/2020   CHF, chronic (HCC)    diastolic   CKD (chronic kidney disease) stage 3, GFR 30-59 ml/min (HCC)    Diabetes mellitus without complication (HCC)    Dysrhythmia    Hypertension    Hypothyroidism    Lymphedema    Paroxysmal atrial fibrillation (HCC)    Prostate cancer [facility])    Renal insufficiency    Past Surgical History:  Procedure Laterality Date   COLON SURGERY N/A    Phreesia 08/04/2020   Current Outpatient Medications on File Prior to Visit  Medication Sig Dispense Refill   allopurinol (ZYLOPRIM) 100 MG tablet Take 0.5 tablets (50 mg total) by mouth daily. 30 tablet 6   apixaban (ELIQUIS) 2.5 MG TABS tablet Take 1 tablet (2.5 mg total) by mouth 2 (two) times daily. 60 tablet 11   carvedilol (COREG) 6.25 MG tablet Take 1 tablet (6.25 mg total) by mouth 2 (two) times daily with a meal. 90 tablet 3   cephALEXin (KEFLEX) 500 MG capsule Take 1 capsule (500 mg total) by mouth 3 (three) times daily. 21 capsule 0   furosemide (LASIX) 40 MG tablet Take 1 tablet (40 mg total) by mouth daily. 30 tablet 3   Iron, Ferrous Sulfate, 325 (65 Fe) MG TABS Take 325 mg by mouth daily. 30 tablet 3   levothyroxine (SYNTHROID) 50 MCG tablet Take 1 tablet (50 mcg  total) by mouth daily. 90 tablet 3   losartan (COZAAR) 50 MG tablet Take 1 tablet (50 mg total) by mouth daily. 90 tablet 3   Multiple Vitamins-Minerals (MULTIVITAMINS THER. W/MINERALS) TABS Take 1 tablet by mouth daily.     spironolactone (ALDACTONE) 25 MG tablet Take 1 tablet (25 mg total) by mouth daily. 90 tablet 3   Current Facility-Administered Medications on File Prior to Visit  Medication Dose Route Frequency Provider Last Rate Last Admin   leuprolide (6 Month) (ELIGARD) injection 45 mg  45 mg Subcutaneous Q6 months Sindy Guadeloupe, MD   45 mg at 12/28/22 1549  Allergies  Allergen Reactions   Lisinopril     FACIAL SWELLING   Social History   Socioeconomic History   Marital status: Widowed    Spouse name: Not on file   Number of children: 2   Years of education: 58   Highest education level: 12th grade  Occupational History   Not on file  Tobacco Use   Smoking status: Never    Passive exposure: Never   Smokeless tobacco: Never  Vaping Use   Vaping Use: Never used  Substance and Sexual Activity   Alcohol use: Yes    Comment: rarely   Drug use: Yes    Types: Marijuana   Sexual activity: Not Currently  Other Topics Concern   Not on file  Social History Narrative   Lives with son. Daughter lives in [place].   Social Determinants of Health   Financial Resource Strain: Low Risk  (12/17/2022)   Overall Financial Resource Strain (CARDIA)    Difficulty of Paying Living Expenses: Not very hard  Food Insecurity: No Food Insecurity (12/17/2022)   Hunger Vital Sign    Worried About Running Out of Food in the Last Year: Never true    Ran Out of Food in the Last Year: Never true  Transportation Needs: No Transportation Needs (12/17/2022)   [facility] - Hydrologist (Medical): No    Lack of Transportation (Non-Medical): No  Physical Activity: Insufficiently Active (12/17/2022)   Exercise Vital Sign    Days of Exercise per Week: 2 days    Minutes of  Exercise per Session: 10 min  Stress: No Stress Concern Present (12/17/2022)   [place]    Feeling of Stress : Not at all  Social Connections: Socially Isolated (12/17/2022)   Social Connection and Isolation Panel [NHANES]    Frequency of Communication with Friends and Family: More than three times a week    Frequency of Social Gatherings with Friends and Family: Never    Attends Religious Services: Never    Marine scientist or Organizations: No    Attends Archivist Meetings: Never    Marital Status: Widowed  Intimate Partner Violence: Not At Risk (12/17/2022)   Humiliation, Afraid, Rape, and Kick questionnaire    Fear of Current or Ex-Partner: No    Emotionally Abused: No    Physically Abused: No    Sexually Abused: No    Past Medical History:  Diagnosis Date   Arthritis    Cancer ([place])    Phreesia 08/04/2020   CHF, chronic (HCC)    diastolic   CKD (chronic kidney disease) stage 3, GFR 30-59 ml/min (HCC)    Diabetes mellitus without complication (HCC)    Dysrhythmia    Hypertension    Hypothyroidism    Lymphedema    Paroxysmal atrial fibrillation (HCC)    Prostate cancer ([place])    Renal insufficiency       Review of Systems     Objective:   Physical Exam Vitals reviewed.  Constitutional:      General: He is not in acute distress.    Appearance: He is obese. He is not ill-appearing, toxic-appearing or diaphoretic.  HENT:     Head: Normocephalic and atraumatic.     Nose: Nose normal. No congestion or rhinorrhea.     Mouth/Throat:     Pharynx: No oropharyngeal exudate or posterior oropharyngeal erythema.  Eyes:     Extraocular Movements: Extraocular  movements intact.     Conjunctiva/sclera: Conjunctivae normal.     Pupils: Pupils are equal, round, and reactive to light.  Cardiovascular:     Rate and Rhythm: Normal rate and regular rhythm.     Heart sounds: Normal heart sounds. No  murmur heard.    No friction rub. No gallop.  Pulmonary:     Effort: Pulmonary effort is normal. No respiratory distress.     Breath sounds: Normal breath sounds. No stridor. No wheezing, rhonchi or rales.  Chest:     Chest wall: No tenderness.  Abdominal:     General: Abdomen is flat. Bowel sounds are normal. There is no distension.     Palpations: Abdomen is soft. There is no mass.     Tenderness: There is no abdominal tenderness. There is no guarding or rebound.     Hernia: No hernia is present.  Musculoskeletal:        General: Swelling present.     Right lower leg: Edema present.     Left lower leg: Edema present.  Skin:    Findings: No erythema.  Neurological:     General: No focal deficit present.     Mental Status: He is alert and oriented to person, place, and time. Mental status is at baseline.     Cranial Nerves: No cranial nerve deficit.     Sensory: No sensory deficit.     Motor: No weakness.     Coordination: Coordination normal.     Gait: Gait normal.       Assessment & Plan:    Lymphedema  Chronic congestive heart failure, unspecified heart failure type (HCC)  Neuropathy  Urinary incontinence, unspecified type First regarding his lymphedema, I gave the patient a prescription for compression hose 10 to 20 mmHg as well as compression wraps.  I want him to go to a local medical supply store to get these therapies.  He wears them daily will help manage his lymphedema better.  Second, one of the issues with compliance is urinary incontinence.  This keeps her from taking his Lasix which is used to help manage his chronic congestive heart failure.  Therefore I gave the patient a prescription for condom catheter supplies.  Hopefully this will help him control some of his incontinence and therefore be more consistent in taking his diuretic.  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  Recommended trying gabapentin 300 mg p.o. nightly

## 2023-01-13 ENCOUNTER — Encounter (HOSPITAL_COMMUNITY)
Admission: RE | Admit: 2023-01-13 | Discharge: 2023-01-13 | Disposition: A | Discharge status: Home / Self Care | Payer: Medicare Other | Source: Ambulatory Visit | Attending: Oncology | Admitting: Oncology

## 2023-01-13 DIAGNOSIS — C7951 Secondary malignant neoplasm of bone: Secondary | ICD-10-CM | POA: Diagnosis present

## 2023-01-13 DIAGNOSIS — C61 Malignant neoplasm of prostate: Secondary | ICD-10-CM

## 2023-01-13 MED ORDER — PIFLIFOLASTAT F 18 (PYLARIFY) INJECTION
9.0000 | Freq: Once | INTRAVENOUS | Status: AC
  Administered 2023-01-13: 9.8 via INTRAVENOUS

## 2023-01-13 NOTE — Progress Notes (Signed)
RN requested STAT read for [facility] PET scheduled today, 2/29.   New patient consult with Dr. Burr Medico scheduled for 3/1.

## 2023-01-14 ENCOUNTER — Other Ambulatory Visit: Payer: Self-pay

## 2023-01-14 ENCOUNTER — Ambulatory Visit: Payer: Medicare Other | Admitting: Oncology

## 2023-01-14 ENCOUNTER — Inpatient Hospital Stay: Payer: Medicare Other | Attending: Hematology | Admitting: Hematology

## 2023-01-14 ENCOUNTER — Other Ambulatory Visit: Payer: Medicare Other

## 2023-01-14 ENCOUNTER — Encounter: Payer: Self-pay | Admitting: Hematology

## 2023-01-14 VITALS — BP 139/81 | HR 68 | Temp 98.2°F | Resp 18 | Ht 67.0 in | Wt 310.1 lb

## 2023-01-14 DIAGNOSIS — Z79899 Other long term (current) drug therapy: Secondary | ICD-10-CM | POA: Insufficient documentation

## 2023-01-14 DIAGNOSIS — C61 Malignant neoplasm of prostate: Secondary | ICD-10-CM

## 2023-01-14 DIAGNOSIS — C7951 Secondary malignant neoplasm of bone: Secondary | ICD-10-CM | POA: Insufficient documentation

## 2023-01-14 NOTE — Progress Notes (Signed)
[place]   Telephone:([phone] Fax:([phone]) [phone]   Clinic Follow up Note   Patient Care Team: Susy Frizzle, MD as PCP - General (Family Medicine) Rockey Situ Kathlene November, MD as Consulting Physician (Cardiology) Edythe Clarity, [facility] as Pharmacist (Pharmacist) Kassie Mends, RN as [place] Management  Date of Service:  01/14/2023  CHIEF COMPLAINT: f/u of Prostate Cancer  CURRENT THERAPY:  Will start Zytiga 1000 mg/ 5 mg prednisone ELIGARD Q6 months   ASSESSMENT:  Terry [name] is a 82 y.o. 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 He has been treated with ADT, but lost follow-up intermittently due to compliant and transportation issue. -last PSA was 74 on 12/21/2022, which has increased from 32.92 years ago -PSMA PET scan was done yesterday showed widespread bone metastasis, and suspicious lymph node metastasis, in addition to hypermetabolism in prostate. -I recommend continue ADT, since he missed ADT treatment in 2023, so his increasing PSA is likely related to untreated disease.  I do not think this is castration resistant disease yet.  Given the extensive bone metastasis, I recommend adding add Zytiga '1000mg'$  daily/prednisone '5mg'$  daily to his treatment for better disease control.  Potential benefit and side effects, especially hypertension, fatigue, fluid retention, cardiac toxicity, abnormal liver function, osteopenia and osteoporosis, were discussed with him in detail.  He agrees to proceed. -He is prediabetic, not on diabetic medicine, will watch his sugar when he is on low-dose prednisone.  Bone metastasis   -From prostate cancer, -Due to his CKD, he is not a candidate for Zometa, I recommend Xgeva injection every 3 months -Continue calcium and vitamin D  supplement   PLAN: -lab reviewed, discuss PSA has been trending up -PSMA PET scan reviewed in person, which showed diffuse bone mets  -recommend oral pill Zytiga /prednisone its benefits and side effects -recommend OTC vitamin D and calcium -recommend a healthy diet -lab, f/u in 6 weeks with Xgeva injection    SUMMARY OF ONCOLOGIC HISTORY: Oncology History Overview Note   Cancer Staging  Prostate cancer [facility]) Staging form: Prostate, AJCC 8th Edition - Clinical stage from 09/27/2021: Stage IVB (cTX, cNX, cM1b) - Signed by Sindy Guadeloupe, MD on 09/27/2021     Prostate cancer ([place])  08/01/2020 Initial Diagnosis   Prostate cancer ([place])   09/27/2021 Cancer Staging   Staging form: Prostate, AJCC 8th Edition - Clinical stage from 09/27/2021: Stage IVB (cTX, cNX, cM1b) - Signed by Sindy Guadeloupe, MD on 09/27/2021   01/13/2023 Imaging    IMPRESSION: 1. Widespread bony metastatic disease involving axial and appendicular skeleton with marked increased radiotracer accumulation. Some areas with sclerosis and other areas with lucency or without CT correlate as discussed. Metastatic disease is much more widespread based on comparison with prior imaging which was not performed with PSMA PET. 2. Enlarging AP window lymph node with moderate radiotracer accumulation, suspicious for metastatic disease. Other lymph node in the RIGHT mediastinum without change with mild radiotracer accumulation. 3. Marked radiotracer accumulation in the prostate. 4. Small lymph nodes scattered throughout the pelvis with radiotracer accumulation that is mild-to-moderate, nonspecific but suspicious given other findings. Attention on follow-up. This includes bilateral groin lymph nodes largest on the RIGHT. 5. 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 6. Signs of prior colonic resection in the LEFT  hemiabdomen.      INTERVAL HISTORY:  Terry [name] is here for a follow up of  Prostate Cancer He was last seen by  Dr.Rao on 12/28/2022 He presents to the clinic alone.Pt got sick a year a go and was in the hospital for one month. Pt was diagnose with Prostate cancer. Pt went to stay with family in [place] when wife past away, but he is back in [place]. Pt has aches and pains in his knees. He also reports of aches and pains all over his body. Pt has a walker he use at home. Pt denied SOB. Pt states he has someone who comes in and helps him with his ADL'S.    All other systems were reviewed with the patient and are negative.  MEDICAL HISTORY:  Past Medical History:  Diagnosis Date   Arthritis    Cancer ([place])    Phreesia 08/04/2020   CHF, chronic (HCC)    diastolic   CKD (chronic kidney disease) stage 3, GFR 30-59 ml/min (HCC)    Diabetes mellitus without complication (HCC)    Dysrhythmia    Hypertension    Hypothyroidism    Lymphedema    Paroxysmal atrial fibrillation (HCC)    Prostate cancer ([place])    Renal insufficiency     SURGICAL HISTORY: Past Surgical History:  Procedure Laterality Date   COLON SURGERY N/A    Phreesia 08/04/2020    I have reviewed the social history and family history with the patient and they are unchanged from previous note.  ALLERGIES:  is allergic to lisinopril.  MEDICATIONS:  Current Outpatient Medications  Medication Sig Dispense Refill   allopurinol (ZYLOPRIM) 100 MG tablet Take 0.5 tablets (50 mg total) by mouth daily. 30 tablet 6   apixaban (ELIQUIS) 2.5 MG TABS tablet Take 1 tablet (2.5 mg total) by mouth 2 (two) times daily. 60 tablet 11   carvedilol (COREG) 6.25 MG tablet Take 1 tablet (6.25 mg total) by mouth 2 (two) times daily with a meal. 90 tablet 3   cephALEXin (KEFLEX) 500 MG capsule Take 1 capsule (500 mg total) by mouth 3 (three) times daily. 21 capsule 0   furosemide (LASIX) 40 MG tablet Take 1 tablet (40 mg total) by mouth daily. 30 tablet 3   gabapentin (NEURONTIN) 300 MG capsule Take 1 capsule (300  mg total) by mouth 3 times/day as needed-between meals & bedtime. 30 capsule 3   Iron, Ferrous Sulfate, 325 (65 Fe) MG TABS Take 325 mg by mouth daily. 30 tablet 3   levothyroxine (SYNTHROID) 50 MCG tablet Take 1 tablet (50 mcg total) by mouth daily. 90 tablet 3   losartan (COZAAR) 50 MG tablet Take 1 tablet (50 mg total) by mouth daily. 90 tablet 3   Multiple Vitamins-Minerals (MULTIVITAMINS THER. W/MINERALS) TABS Take 1 tablet by mouth daily.     spironolactone (ALDACTONE) 25 MG tablet Take 1 tablet (25 mg total) by mouth daily. 90 tablet 3   No current facility-administered medications for this visit.   Facility-Administered Medications Ordered in Other Visits  Medication Dose Route Frequency Provider Last Rate Last Admin   leuprolide (6 Month) (ELIGARD) injection 45 mg  45 mg Subcutaneous Q6 months Sindy Guadeloupe, MD   45 mg at 12/28/22 1549    PHYSICAL EXAMINATION: ECOG PERFORMANCE STATUS: 3 - Symptomatic, >50% confined to bed  Vitals:   01/14/23 1144  BP: 139/81  Pulse: 68  Resp: 18  Temp: 98.2 F (36.8 C)  SpO2: 100%  Wt Readings from Last 3 Encounters:  01/14/23 (!) 310 lb 1.6 oz (140.7 kg)  01/10/23 (!) 310 lb (140.6 kg)  01/06/23 (!) 309 lb (140.2 kg)     GENERAL:alert, no distress and comfortable SKIN: skin color, (+)texture, turgor are normal, no rashes or significant lesions NECK: (-) supple, thyroid normal size, non-tender, without nodularity LYMPH: (-)  no palpable lymphadenopathy in the cervical, axillary  LUNGS: (-) clear to auscultation and percussion with normal breathing effort HEART:(-)  regular rate & rhythm and no murmurs and (+) lower extremity edema ABDOMEN:(-) abdomen soft, (-) non-tender and normal bowel sounds   LABORATORY DATA:  I have reviewed the data as listed    Latest Ref Rng & Units 12/21/2022    9:59 AM 08/31/2022    9:49 AM 07/13/2022    1:12 PM  CBC  WBC 3.8 - 10.8 Thousand/uL 5.8  6.0  6.1   Hemoglobin 13.2 - 17.1 g/dL 12.6  11.6   10.6   Hematocrit 38.5 - 50.0 % 41.3  38.0  35.0   Platelets 140 - 400 Thousand/uL 200  200  249         Latest Ref Rng & Units 12/21/2022    9:59 AM 08/31/2022    9:49 AM 07/13/2022    1:12 PM  CMP  Glucose 65 - 99 mg/dL 100  121  85   BUN 7 - 25 mg/dL '28  25  29   '$ Creatinine 0.70 - 1.22 mg/dL 1.75  1.61  1.86   Sodium 135 - 146 mmol/L 140  139  141   Potassium 3.5 - 5.3 mmol/L 4.3  4.3  4.3   Chloride 98 - 110 mmol/L 106  105  108   CO2 20 - 32 mmol/L '22  25  24   '$ Calcium 8.6 - 10.3 mg/dL 9.9  9.5  9.5   Total Protein 6.1 - 8.1 g/dL 8.1  7.7  7.8   Total Bilirubin 0.2 - 1.2 mg/dL 0.3  0.2  0.3   AST 10 - 35 U/L '13  13  13   '$ ALT 9 - 46 U/L '7  8  7       '$ RADIOGRAPHIC STUDIES: I have personally reviewed the radiological images as listed and agreed with the findings in the report. NM PET (PSMA) SKULL TO MID THIGH  Result Date: 01/13/2023 CLINICAL DATA:  Metastatic prostate cancer. EXAM: NUCLEAR MEDICINE PET SKULL BASE TO THIGH TECHNIQUE: November of 2022 CT imaging and prior bone scan also from November of 2022. MCi F18 Piflufolastat (Pylarify) was injected intravenously. Full-ring PET imaging was performed from the skull base to thigh after the radiotracer. CT data was obtained and used for attenuation correction and anatomic localization. COMPARISON:  Previous CT of the chest, abdomen and pelvis of January 13, 2023 FINDINGS: NECK No radiotracer activity in neck lymph nodes. Incidental CT finding: None. CHEST Enlarging AP window lymph node 18 mm previously less than a cm with moderate PSMA activity 4.61 maximum SUV. Calcified lymph node along the RIGHT paratracheal chain (image 89/4) also with mild-to-moderate PSMA activity but not changed in size. No new signs of nodal disease in the chest. Incidental CT finding: Stable appearance on noncontrast imaging of the heart and great vessels. No additional signs of adenopathy by size criteria. No consolidation or pleural effusion.  ABDOMEN/PELVIS Prostate: Diffuse prostate activity with a maximum SUV of 48.87. Adjacent activity associated with pelvic sidewall musculature but without visible CT lesion, area measuring approximately 1 cm  and potentially contiguous with the gland with a maximum SUV of 22.88 on image 195/4. Indistinct margins of the prostate similar to prior imaging. Lymph nodes: RIGHT external iliac lymph node (image 182/4 8 mm, not changed in size with a maximum SUV of 5.10. (Image 173/4) approximately 2-3 6 mm lymph nodes along the RIGHT external iliac chain adjacent to the stent in the ureter with only mild PSMA accumulation. Collection of small lymph nodes along the LEFT pelvic sidewall along the internal iliac chain (image 182/4 there are approximately 4-5 small lymph nodes in this area showing only mild PSMA accumulation with a maximum SUV of 3.47. Bilateral inguinal lymph nodes largest on the RIGHT unchanged at approximately 17 mm showing moderate accumulation of radiotracer Liver: No evidence of liver metastasis. Incidental CT finding: No acute findings relative to liver, pancreas, spleen, adrenal glands or LEFT kidney. 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 No acute gastrointestinal process. Normal appendix. Signs of prior colonic resection in the LEFT hemiabdomen. SKELETON Diffuse skeletal metastatic disease. Involvement of C7 and the subjacent first LEFT costovertebral angle without visible lesion at these levels. LEFT first costovertebral margin with a maximum SUV of 43.8, no discrete CT abnormality. (Image 75/4) sclerotic lesion in RIGHT posterolateral T3 vertebral body with a maximum SUV of 41.99. LEFT lamina and spinous process at T5 with a maximum SUV of 42.4. Similar activity at the T10 LEFT transverse process, in the LEFT medial clavicle, in the mid sternum and with scattered areas of low level activity in the ribs. Note that some of these areas such as the T3 lesion showed dense  sclerosis and others are not visible on CT as discrete abnormalities. No frankly destructive process is noted about the bony thorax. Changes present at the L5 level where there was a sclerotic lesion now mixed lytic and sclerotic changes are present with marked radiotracer accumulation showing a maximum SUV of 151.82. Suspect the lesion measures at least 3 cm on image 166/4. Subtle sclerosis in the contralateral RIGHT hemi sacrum at S1 shows sclerosis measuring 16 mm on the CT with similar marked increased radiotracer accumulation. At least 3 lesions in the LEFT iliac bone, single marked area of increased radiotracer accumulation in the RIGHT inferior pubic bone associated with sclerosis measuring approximately 2.9 cm. Occult on CT is an area in the RIGHT superior acetabulum also with increased radiotracer accumulation but without visible sclerotic changes. Involvement of the RIGHT scapula noted on image 61/4. IMPRESSION: 1. Widespread bony metastatic disease involving axial and appendicular skeleton with marked increased radiotracer accumulation. Some areas with sclerosis and other areas with lucency or without CT correlate as discussed. Metastatic disease is much more widespread based on comparison with prior imaging which was not performed with PSMA PET. 2. Enlarging AP window lymph node with moderate radiotracer accumulation, suspicious for metastatic disease. Other lymph node in the RIGHT mediastinum without change with mild radiotracer accumulation. 3. Marked radiotracer accumulation in the prostate. 4. Small lymph nodes scattered throughout the pelvis with radiotracer accumulation that is mild-to-moderate, nonspecific but suspicious given other findings. Attention on follow-up. This includes bilateral groin lymph nodes largest on the RIGHT. 5. 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 6. Signs of prior colonic resection in the LEFT hemiabdomen. Electronically Signed   By: Zetta Bills M.D.   On: 01/13/2023 15:48      No orders of the defined types were placed in this encounter.  All questions were answered. The patient knows  to call the clinic with any problems, questions or concerns. No barriers to learning was detected. The total time spent in the appointment was 40 minutes.     Truitt Merle, MD 01/14/2023   Felicity Coyer, CMA, am acting as scribe for Truitt Merle, MD.   I have reviewed the above documentation for accuracy and completeness, and I agree with the above.

## 2023-01-14 NOTE — Assessment & Plan Note (Signed)
Stage IV with bone metastasis, castrate sensitive  -Initially diagnosed in 2018, treated in [place], prostate biopsy showed Gleason score 5+4 = 9 adenocarcinoma with metastasis to pubic rami. He has been treated with ADT, but lost follow-up intermittently due to compliant and transportation issue. -last PSA was 74 on 12/21/2022, which has increased from 32.92 years ago -PSMA PET scan was done yesterday showed widespread bone metastasis, and suspicious lymph node metastasis, in addition to hypermetabolism in prostate. -I recommend continue ADT, since he missed ADT treatment in 2023.  If his PSA does not come down significantly in the next 6 months, I will add Zytiga '1000mg'$  daily/prednisone '5mg'$  daily to his treatment for better disease control

## 2023-01-14 NOTE — Progress Notes (Deleted)
[place]   [phone] Fax:([phone]) [place] Note   Patient Care Team: Susy Frizzle, MD as PCP - General (Family Medicine) Rockey Situ Kathlene November, MD as Consulting Physician (Cardiology) Edythe Clarity, [facility] as Pharmacist (Pharmacist) Kassie Mends, RN as [place] Management  Date of Service:  01/14/2023   CHIEF COMPLAINTS/PURPOSE OF CONSULTATION:  Prostate Cancer  REFERRING PHYSICIAN:  Sindy Guadeloupe, MD  ASSESSMENT & PLAN:  Terry [name] is a 82 y.o. *** male with a history of ***  1. {Diagnosis from problem list} ***      PLAN:  ***  Oncology History  Prostate cancer ([place])  08/01/2020 Initial Diagnosis   Prostate cancer ([place])   09/27/2021 Cancer Staging   Staging form: Prostate, AJCC 8th Edition - Clinical stage from 09/27/2021: Stage IVB (cTX, cNX, cM1b) - Signed by Sindy Guadeloupe, MD on 09/27/2021      HISTORY OF PRESENTING ILLNESS: *** Terry [name] 82 y.o. male is a here because of Prostate Cancer. The patient was referred by RAO, Archana C,MD . The patient presents to the clinic today accompanied by ***.   {Story of what lead them here}   Today the patient notes they felt/feeling prior/after...   He has a PMHx of.... Arthritis Diabetes Prostate Cancer Hypertension CHF Kidney Disease Thyroid Disease   Socially.Marland KitchenMarland KitchenWidowed 2 children  1 son and 1 daughter Lives with son     REVIEW OF SYSTEMS:   *** Constitutional: Denies fevers, chills or abnormal night sweats Eyes: Denies blurriness of vision, double vision or watery eyes Ears, nose, mouth, throat, and face: Denies mucositis or sore throat Respiratory: Denies cough, dyspnea or wheezes Cardiovascular: Denies palpitation, chest discomfort or lower extremity swelling Gastrointestinal:  Denies nausea, heartburn or change in bowel habits Skin: Denies abnormal skin rashes Lymphatics: Denies new lymphadenopathy or easy  bruising Neurological:Denies numbness, tingling or new weaknesses Behavioral/Psych: Mood is stable, no new changes  All other systems were reviewed with the patient and are negative.   MEDICAL HISTORY:  Past Medical History:  Diagnosis Date   Arthritis    Cancer ([place])    Phreesia 08/04/2020   CHF, chronic (HCC)    diastolic   CKD (chronic kidney disease) stage 3, GFR 30-59 ml/min (HCC)    Diabetes mellitus without complication (HCC)    Dysrhythmia    Hypertension    Hypothyroidism    Lymphedema    Paroxysmal atrial fibrillation (HCC)    Prostate cancer ([place])    Renal insufficiency     SURGICAL HISTORY: Past Surgical History:  Procedure Laterality Date   COLON SURGERY N/A    Phreesia 08/04/2020    SOCIAL HISTORY: Social History   Socioeconomic History   Marital status: Widowed    Spouse name: Not on file   Number of children: 2   Years of education: 63   Highest education level: 12th grade  Occupational History   Not on file  Tobacco Use   Smoking status: Never    Passive exposure: Never   Smokeless tobacco: Never  Vaping Use   Vaping Use: Never used  Substance and Sexual Activity   Alcohol use: Yes    Comment: rarely   Drug use: Yes    Types: Marijuana   Sexual activity: Not Currently  Other Topics Concern   Not on file  Social History Narrative   Lives with son. Daughter lives in [place].   Social Determinants of Health  Financial Resource Strain: Low Risk  (12/17/2022)   Overall Financial Resource Strain (CARDIA)    Difficulty of Paying Living Expenses: Not very hard  Food Insecurity: No Food Insecurity (12/17/2022)   Hunger Vital Sign    Worried About Running Out of Food in the Last Year: Never true    Ran Out of Food in the Last Year: Never true  Transportation Needs: No Transportation Needs (12/17/2022)   PRAPARE - Hydrologist (Medical): No    Lack of Transportation (Non-Medical): No  Physical Activity: Insufficiently  Active (12/17/2022)   Exercise Vital Sign    Days of Exercise per Week: 2 days    Minutes of Exercise per Session: 10 min  Stress: No Stress Concern Present (12/17/2022)   [place]    Feeling of Stress : Not at all  Social Connections: Socially Isolated (12/17/2022)   Social Connection and Isolation Panel [NHANES]    Frequency of Communication with Friends and Family: More than three times a week    Frequency of Social Gatherings with Friends and Family: Never    Attends Religious Services: Never    Marine scientist or Organizations: No    Attends Archivist Meetings: Never    Marital Status: Widowed  Intimate Partner Violence: Not At Risk (12/17/2022)   Humiliation, Afraid, Rape, and Kick questionnaire    Fear of Current or Ex-Partner: No    Emotionally Abused: No    Physically Abused: No    Sexually Abused: No    FAMILY HISTORY: Family History  Problem Relation Age of Onset   Hypertension Mother    Diabetes Son     ALLERGIES:  is allergic to lisinopril.  MEDICATIONS:  Current Outpatient Medications  Medication Sig Dispense Refill   allopurinol (ZYLOPRIM) 100 MG tablet Take 0.5 tablets (50 mg total) by mouth daily. 30 tablet 6   apixaban (ELIQUIS) 2.5 MG TABS tablet Take 1 tablet (2.5 mg total) by mouth 2 (two) times daily. 60 tablet 11   carvedilol (COREG) 6.25 MG tablet Take 1 tablet (6.25 mg total) by mouth 2 (two) times daily with a meal. 90 tablet 3   cephALEXin (KEFLEX) 500 MG capsule Take 1 capsule (500 mg total) by mouth 3 (three) times daily. 21 capsule 0   furosemide (LASIX) 40 MG tablet Take 1 tablet (40 mg total) by mouth daily. 30 tablet 3   gabapentin (NEURONTIN) 300 MG capsule Take 1 capsule (300 mg total) by mouth 3 times/day as needed-between meals & bedtime. 30 capsule 3   Iron, Ferrous Sulfate, 325 (65 Fe) MG TABS Take 325 mg by mouth daily. 30 tablet 3   levothyroxine (SYNTHROID)  50 MCG tablet Take 1 tablet (50 mcg total) by mouth daily. 90 tablet 3   losartan (COZAAR) 50 MG tablet Take 1 tablet (50 mg total) by mouth daily. 90 tablet 3   Multiple Vitamins-Minerals (MULTIVITAMINS THER. W/MINERALS) TABS Take 1 tablet by mouth daily.     spironolactone (ALDACTONE) 25 MG tablet Take 1 tablet (25 mg total) by mouth daily. 90 tablet 3   No current facility-administered medications for this visit.   Facility-Administered Medications Ordered in Other Visits  Medication Dose Route Frequency Provider Last Rate Last Admin   leuprolide (6 Month) (ELIGARD) injection 45 mg  45 mg Subcutaneous Q6 months Sindy Guadeloupe, MD   45 mg at 12/28/22 1549    PHYSICAL EXAMINATION: ECOG PERFORMANCE STATUS: {  CHL ONC ECOG [phone]}  There were no vitals filed for this visit. There were no vitals filed for this visit. *** GENERAL:alert, no distress and comfortable SKIN: skin color, texture, turgor are normal, no rashes or significant lesions EYES: normal, Conjunctiva are pink and non-injected, sclera clear {OROPHARYNX:no exudate, no erythema and lips, buccal mucosa, and tongue normal}  NECK: supple, thyroid normal size, non-tender, without nodularity LYMPH:  no palpable lymphadenopathy in the cervical, axillary {or inguinal} LUNGS: clear to auscultation and percussion with normal breathing effort HEART: regular rate & rhythm and no murmurs and no lower extremity edema ABDOMEN:abdomen soft, non-tender and normal bowel sounds Musculoskeletal:no cyanosis of digits and no clubbing  NEURO: alert & oriented x 3 with fluent speech, no focal motor/sensory deficits  LABORATORY DATA:  I have reviewed the data as listed    Latest Ref Rng & Units 12/21/2022    9:59 AM 08/31/2022    9:49 AM 07/13/2022    1:12 PM  CBC  WBC 3.8 - 10.8 Thousand/uL 5.8  6.0  6.1   Hemoglobin 13.2 - 17.1 g/dL 12.6  11.6  10.6   Hematocrit 38.5 - 50.0 % 41.3  38.0  35.0   Platelets 140 - 400 Thousand/uL 200  200   249        Latest Ref Rng & Units 12/21/2022    9:59 AM 08/31/2022    9:49 AM 07/13/2022    1:12 PM  CMP  Glucose 65 - 99 mg/dL 100  121  85   BUN 7 - 25 mg/dL '28  25  29   '$ Creatinine 0.70 - 1.22 mg/dL 1.75  1.61  1.86   Sodium 135 - 146 mmol/L 140  139  141   Potassium 3.5 - 5.3 mmol/L 4.3  4.3  4.3   Chloride 98 - 110 mmol/L 106  105  108   CO2 20 - 32 mmol/L '22  25  24   '$ Calcium 8.6 - 10.3 mg/dL 9.9  9.5  9.5   Total Protein 6.1 - 8.1 g/dL 8.1  7.7  7.8   Total Bilirubin 0.2 - 1.2 mg/dL 0.3  0.2  0.3   AST 10 - 35 U/L '13  13  13   '$ ALT 9 - 46 U/L '7  8  7      '$ RADIOGRAPHIC STUDIES: I have personally reviewed the radiological images as listed and agreed with the findings in the report. NM PET (PSMA) SKULL TO MID THIGH  Result Date: 01/13/2023 CLINICAL DATA:  Metastatic prostate cancer. EXAM: NUCLEAR MEDICINE PET SKULL BASE TO THIGH TECHNIQUE: November of 2022 CT imaging and prior bone scan also from November of 2022. MCi F18 Piflufolastat (Pylarify) was injected intravenously. Full-ring PET imaging was performed from the skull base to thigh after the radiotracer. CT data was obtained and used for attenuation correction and anatomic localization. COMPARISON:  Previous CT of the chest, abdomen and pelvis of January 13, 2023 FINDINGS: NECK No radiotracer activity in neck lymph nodes. Incidental CT finding: None. CHEST Enlarging AP window lymph node 18 mm previously less than a cm with moderate PSMA activity 4.61 maximum SUV. Calcified lymph node along the RIGHT paratracheal chain (image 89/4) also with mild-to-moderate PSMA activity but not changed in size. No new signs of nodal disease in the chest. Incidental CT finding: Stable appearance on noncontrast imaging of the heart and great vessels. No additional signs of adenopathy by size criteria. No consolidation or pleural effusion. ABDOMEN/PELVIS Prostate: Diffuse prostate activity with  a maximum SUV of 48.87. Adjacent activity associated  with pelvic sidewall musculature but without visible CT lesion, area measuring approximately 1 cm and potentially contiguous with the gland with a maximum SUV of 22.88 on image 195/4. Indistinct margins of the prostate similar to prior imaging. Lymph nodes: RIGHT external iliac lymph node (image 182/4 8 mm, not changed in size with a maximum SUV of 5.10. (Image 173/4) approximately 2-3 6 mm lymph nodes along the RIGHT external iliac chain adjacent to the stent in the ureter with only mild PSMA accumulation. Collection of small lymph nodes along the LEFT pelvic sidewall along the internal iliac chain (image 182/4 there are approximately 4-5 small lymph nodes in this area showing only mild PSMA accumulation with a maximum SUV of 3.47. Bilateral inguinal lymph nodes largest on the RIGHT unchanged at approximately 17 mm showing moderate accumulation of radiotracer Liver: No evidence of liver metastasis. Incidental CT finding: No acute findings relative to liver, pancreas, spleen, adrenal glands or LEFT kidney. 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 No acute gastrointestinal process. Normal appendix. Signs of prior colonic resection in the LEFT hemiabdomen. SKELETON Diffuse skeletal metastatic disease. Involvement of C7 and the subjacent first LEFT costovertebral angle without visible lesion at these levels. LEFT first costovertebral margin with a maximum SUV of 43.8, no discrete CT abnormality. (Image 75/4) sclerotic lesion in RIGHT posterolateral T3 vertebral body with a maximum SUV of 41.99. LEFT lamina and spinous process at T5 with a maximum SUV of 42.4. Similar activity at the T10 LEFT transverse process, in the LEFT medial clavicle, in the mid sternum and with scattered areas of low level activity in the ribs. Note that some of these areas such as the T3 lesion showed dense sclerosis and others are not visible on CT as discrete abnormalities. No frankly destructive process is noted  about the bony thorax. Changes present at the L5 level where there was a sclerotic lesion now mixed lytic and sclerotic changes are present with marked radiotracer accumulation showing a maximum SUV of 151.82. Suspect the lesion measures at least 3 cm on image 166/4. Subtle sclerosis in the contralateral RIGHT hemi sacrum at S1 shows sclerosis measuring 16 mm on the CT with similar marked increased radiotracer accumulation. At least 3 lesions in the LEFT iliac bone, single marked area of increased radiotracer accumulation in the RIGHT inferior pubic bone associated with sclerosis measuring approximately 2.9 cm. Occult on CT is an area in the RIGHT superior acetabulum also with increased radiotracer accumulation but without visible sclerotic changes. Involvement of the RIGHT scapula noted on image 61/4. IMPRESSION: 1. Widespread bony metastatic disease involving axial and appendicular skeleton with marked increased radiotracer accumulation. Some areas with sclerosis and other areas with lucency or without CT correlate as discussed. Metastatic disease is much more widespread based on comparison with prior imaging which was not performed with PSMA PET. 2. Enlarging AP window lymph node with moderate radiotracer accumulation, suspicious for metastatic disease. Other lymph node in the RIGHT mediastinum without change with mild radiotracer accumulation. 3. Marked radiotracer accumulation in the prostate. 4. Small lymph nodes scattered throughout the pelvis with radiotracer accumulation that is mild-to-moderate, nonspecific but suspicious given other findings. Attention on follow-up. This includes bilateral groin lymph nodes largest on the RIGHT. 5. 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 6. Signs of prior colonic resection in the LEFT hemiabdomen. Electronically Signed   By: Zetta Bills M.D.   On: 01/13/2023 15:48  No orders of the defined types were placed in this  encounter.   All questions were answered. The patient knows to call the clinic with any problems, questions or concerns. The total time spent in the appointment was {CHL ONC TIME VISIT - [id number].     Baldemar Friday, CMA 01/14/2023 10:26 AM  I, Audry Riles am acting as scribe for Truitt Merle, MD.   {Add scribe attestation statement}

## 2023-01-17 ENCOUNTER — Other Ambulatory Visit: Payer: Self-pay | Admitting: Hematology

## 2023-01-17 ENCOUNTER — Other Ambulatory Visit (HOSPITAL_COMMUNITY): Payer: Self-pay

## 2023-01-17 ENCOUNTER — Encounter: Payer: Self-pay | Admitting: Oncology

## 2023-01-17 MED ORDER — ABIRATERONE ACETATE 250 MG PO TABS
1000.0000 mg | ORAL_TABLET | Freq: Every day | ORAL | 1 refills | Status: DC
  Filled 2023-01-17 – 2023-01-19 (×2): qty 120, 30d supply, fill #0

## 2023-01-17 MED ORDER — PREDNISONE 5 MG PO TABS
5.0000 mg | ORAL_TABLET | Freq: Every day | ORAL | 1 refills | Status: DC
  Filled 2023-01-17 – 2023-01-19 (×2): qty 90, 90d supply, fill #0

## 2023-01-17 NOTE — Addendum Note (Signed)
Addended by: Truitt Merle on: 01/17/2023 04:41 PM   Modules accepted: Orders

## 2023-01-17 NOTE — Progress Notes (Signed)
  Chronic Care Management Note  01/17/2023 Name: [name] MRN: [id number] DOB: [birth year]  Terry [name] is a 82 y.o. year old male who is a primary care patient of Susy Frizzle, MD and is actively engaged with the Chronic Care Management team. I reached out to [place] by phone today to assist with re-scheduling an initial visit with the Pharmacist  Follow up plan: Unsuccessful telephone outreach attempt made. A HIPAA compliant phone message was left for the patient providing contact information and requesting a return call.  The care management team will reach out to the patient again over the next 7 days.  If patient returns call to provider office, please advise to call [place] at [place], [place], [place] [address] Direct Dial: [phone] [email or web address]

## 2023-01-18 ENCOUNTER — Encounter: Payer: Self-pay | Admitting: Oncology

## 2023-01-18 ENCOUNTER — Other Ambulatory Visit (HOSPITAL_COMMUNITY): Payer: Self-pay

## 2023-01-18 ENCOUNTER — Telehealth: Payer: Self-pay | Admitting: Pharmacist

## 2023-01-18 ENCOUNTER — Other Ambulatory Visit: Payer: Self-pay | Admitting: Pharmacist

## 2023-01-18 ENCOUNTER — Telehealth: Payer: Self-pay

## 2023-01-18 ENCOUNTER — Telehealth: Payer: Self-pay | Admitting: Pharmacy Technician

## 2023-01-18 NOTE — Telephone Encounter (Signed)
Call from [place] at [facility]. Pt's Oncologist is wanting to try and start the patient on a new cancer medication called Zytiga but it interacts the pt's Spironolactone.  Wells Guiles asks if there is a possible alternative to the Spironolactone that could be sent in instead? Call back # [phone].

## 2023-01-18 NOTE — Telephone Encounter (Addendum)
Oral Oncology Pharmacist Encounter  Received new prescription for Zytiga (abiraterone) for the treatment of metastatic castrate sensitive prostate cancer in conjunction with prednisone and ADT, planned duration until disease progression or unacceptable drug toxicity.  CBC w/ Diff and CMP from 12/21/22 assessed, noted patient with Scr of 1.75 mg/dL (CrCl ~65.8 mL/min) - no baseline renal dose adjustments are required for Zytiga. Prescription dose and frequency assessed for appropriateness.  Current medication list in Epic reviewed, DDIs with Zytiga identified: Category D drug-drug interaction between Zytiga and Spironolactone - Spironolactone can decrease efficacy of Zytiga - would recommend alternative agent to spironolactone while patient is on Zytiga. I will reach out to patient's PCP office to see if alternative is available.  ADDENDUM: 01/18/2023 1:51 PM Received call back from Karle Starch, LPN with Dr. Samella Parr office - OK per Dr. Dennard Schaumann for patient to discontinue spironolactone when starting Zytiga. I have discontinued the spironolactone in patient's medication list and also called and had it d/c'd at patient's [facility].   Evaluated chart and no patient barriers to medication adherence noted.   Patient agreement for treatment documented in MD note on 01/14/23.  Prescription has been e-scribed to the [facility] for benefits analysis and approval.  Oral Oncology Clinic will continue to follow for insurance authorization, copayment issues, initial counseling and start date.  Leron Croak, PharmD, BCPS, [facility] Hematology/Oncology Clinical Pharmacist [facility] and [place] [phone] 01/18/2023 8:34 AM

## 2023-01-18 NOTE — Telephone Encounter (Signed)
Oral Oncology Patient Advocate Encounter   Received notification that prior authorization for Abiraterone is required.   PA submitted on 01/18/23 Key [id number] Status is pending     Lady Deutscher, CPhT-Adv Oncology Pharmacy Patient [place] Direct Number: [phone]  Fax: [phone]

## 2023-01-18 NOTE — Telephone Encounter (Signed)
Oral Chemotherapy Pharmacist Encounter   Attempted to reach patient to provide update and offer for initial counseling on oral medication: Zytiga (abiraterone).   No answer. Left voicemail for patient to call back to discuss details of medication acquisition and initial counseling session.  Leron Croak, PharmD, BCPS, BCOP Hematology/Oncology Clinical Pharmacist [facility] and [place] [phone] 01/18/2023 2:20 PM

## 2023-01-18 NOTE — Telephone Encounter (Signed)
Oral Oncology Patient Advocate Encounter  Prior Authorization for abiraterone has been approved.    PA# [id number] Effective dates: 01/18/23 through 11/15/23  Patients co-pay is $0.    Lady Deutscher, CPhT-Adv Oncology Pharmacy Patient [place] Direct Number: [phone]  Fax: [phone]

## 2023-01-19 ENCOUNTER — Other Ambulatory Visit: Payer: Self-pay

## 2023-01-19 ENCOUNTER — Other Ambulatory Visit (HOSPITAL_COMMUNITY): Payer: Self-pay

## 2023-01-19 NOTE — Telephone Encounter (Signed)
Oral Chemotherapy Pharmacist Encounter  I spoke with patient, with patient's daughter Malachy Mood in the background, for overview of: Zytiga for the treatment of metastatic, castration-sensitive prostate cancer in conjunction with prednisone and ADT, planned duration until disease progression or unacceptable toxicity.   Counseled patient on administration, dosing, side effects, monitoring, drug-food interactions, safe handling, storage, and disposal.  Patient will take Zytiga '250mg'$  tablets, 4 tablets ('1000mg'$ ) by mouth once daily on an empty stomach, 1 hour before or 2 hours after a meal.  Patient will take prednisone '5mg'$  tablet, 1 tablet by mouth one daily with breakfast.  Patient knows to avoid grapefruit and grapefruit juice while on Zytiga.   Zytiga start date: 01/22/23 AM  Adverse effects include but are not limited to: peripheral edema, GI upset, hypertension, hot flashes, fatigue, and arthralgias.    Reviewed with patient importance of keeping a medication schedule and plan for any missed doses. No barriers to medication adherence identified.  Medication reconciliation performed and medication/allergy list updated.  All questions answered.  Mr. Marana voiced understanding and appreciation.   Medication education handout placed in mail for patient. Patient knows to call the office with questions or concerns. Oral Chemotherapy Clinic phone number provided to patient.   Leron Croak, PharmD, BCPS, [facility] Hematology/Oncology Clinical Pharmacist [facility] and [place] [phone] 01/19/2023 2:56 PM

## 2023-01-20 NOTE — Progress Notes (Signed)
RN left voicemail for call back to assess any navigation needs.

## 2023-01-25 NOTE — Progress Notes (Signed)
   Care Guide Note  01/25/2023 Name: Terry [name] MRN: [id number] DOB: 13-Dec-1940  Referred by: Susy Frizzle, MD Reason for referral : Care Coordination (Outreach to reschedule missed initial with Pharm d )   Terry [name] is a 82 y.o. year old male who is a primary care patient of Pickard, [name], MD. Terry [name] was referred to the pharmacist for assistance related to DM.    A third unsuccessful telephone outreach was attempted today to contact the patient who was referred to the pharmacy team for assistance with medication management. The Population Health team is pleased to engage with this patient at any time in the future upon receipt of referral and should he/she be interested in assistance from the [facility] team.   Noreene Larsson, [place], [place] [address] Direct Dial: [phone] [email or web address]

## 2023-02-01 ENCOUNTER — Telehealth: Payer: Medicare Other

## 2023-02-01 ENCOUNTER — Telehealth: Payer: Self-pay | Admitting: *Deleted

## 2023-02-01 NOTE — Telephone Encounter (Signed)
   CCM RN Visit Note   02/01/23 Name: Terry [name] MRN: [id number]      DOB: 12-17-1940  Subjective: Terry [name] is a 82 y.o. year old male who is a primary care patient of Jenna Luo MD. The patient was referred to the Chronic Care Management team for assistance with care management needs subsequent to provider initiation of CCM services and plan of care.      An unsuccessful telephone outreach was attempted today to contact the patient about Chronic Care Management needs.    Plan:Telephone follow up appointment with care management team member scheduled for:  upon care guide rescheduling.  [name] RNC, BSN RN Case Manager [facility] [phone]

## 2023-02-03 ENCOUNTER — Telehealth: Payer: Self-pay | Admitting: Hematology

## 2023-02-03 NOTE — Telephone Encounter (Signed)
Contacted patient to scheduled appointments. Left message with appointment details and a call back number if patient had any questions or could not accommodate the time we provided.   

## 2023-02-11 ENCOUNTER — Other Ambulatory Visit: Payer: Self-pay

## 2023-02-14 ENCOUNTER — Other Ambulatory Visit: Payer: Self-pay

## 2023-02-15 ENCOUNTER — Telehealth: Payer: Self-pay

## 2023-02-15 NOTE — Telephone Encounter (Signed)
Pt's daughter called in concerned that pt's legs have started to swell again. Pt's daughter just wanted to know if pcp could have a nurse come out to pt's home to do the wrapping of his legs. Pt's daughter would like a cb concerning this please. Please advise.  Cb#: [phone]

## 2023-02-16 ENCOUNTER — Other Ambulatory Visit: Payer: Self-pay

## 2023-02-16 DIAGNOSIS — C61 Malignant neoplasm of prostate: Secondary | ICD-10-CM

## 2023-02-16 DIAGNOSIS — I509 Heart failure, unspecified: Secondary | ICD-10-CM

## 2023-02-16 DIAGNOSIS — I480 Paroxysmal atrial fibrillation: Secondary | ICD-10-CM

## 2023-02-16 DIAGNOSIS — L03119 Cellulitis of unspecified part of limb: Secondary | ICD-10-CM

## 2023-02-22 ENCOUNTER — Other Ambulatory Visit (HOSPITAL_COMMUNITY): Payer: Self-pay

## 2023-02-24 ENCOUNTER — Other Ambulatory Visit: Payer: Self-pay

## 2023-02-24 DIAGNOSIS — C61 Malignant neoplasm of prostate: Secondary | ICD-10-CM

## 2023-02-24 NOTE — Assessment & Plan Note (Deleted)
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 He has been treated with ADT, but lost follow-up intermittently due to compliant and transportation issue. -last PSA was 74 on 12/21/2022, which has increased from 32.92 years ago -PSMA PET scan was done yesterday showed widespread bone metastasis, and suspicious lymph node metastasis, in addition to hypermetabolism in prostate. -I recommend continue ADT, since he missed ADT treatment in 2023, so his increasing PSA is likely related to untreated disease.  I do not think this is castration resistant disease yet.  Given the extensive bone metastasis, I recommend adding add Zytiga 1000mg  daily/prednisone 5mg  daily to his treatment for better disease control.  He started on 01/22/2023 -He is prediabetic, not on diabetic medicine, will watch his sugar when he is on low-dose prednisone.

## 2023-02-24 NOTE — Assessment & Plan Note (Deleted)
-  From prostate cancer, -Due to his CKD, he is not a candidate for Zometa, I recommend Xgeva injection every 3 months -Continue calcium and vitamin D supplement

## 2023-02-25 ENCOUNTER — Inpatient Hospital Stay: Payer: Medicare Other

## 2023-02-25 ENCOUNTER — Ambulatory Visit: Payer: Medicare Other

## 2023-02-25 ENCOUNTER — Other Ambulatory Visit: Payer: Medicare Other

## 2023-02-25 ENCOUNTER — Inpatient Hospital Stay: Payer: Medicare Other | Attending: Hematology

## 2023-02-25 ENCOUNTER — Ambulatory Visit: Payer: Medicare Other | Admitting: Hematology

## 2023-02-25 ENCOUNTER — Telehealth: Payer: Self-pay

## 2023-02-25 ENCOUNTER — Inpatient Hospital Stay: Payer: Medicare Other | Admitting: Hematology

## 2023-02-25 DIAGNOSIS — C61 Malignant neoplasm of prostate: Secondary | ICD-10-CM

## 2023-02-25 NOTE — Progress Notes (Deleted)
[facility]   Telephone:(336) [phone] Fax:(336) [phone]   Clinic Follow up Note   Patient Care Team: Donita Brooks, MD as PCP - General (Family Medicine) Mariah Milling Tollie Pizza, MD as Consulting Physician (Cardiology) Erroll Luna, [facility] as Pharmacist (Pharmacist) Audrie Gallus, RN as [facility] Care Management Malachy Mood, MD as Consulting Physician (Oncology)  Date of Service:  02/25/2023  CHIEF COMPLAINT: f/u of  Prostate Cancer   CURRENT THERAPY:  Will start Zytiga 1000 mg/ 5 mg prednisone ELIGARD Q6 months   ASSESSMENT: *** Terry [name] is a 82 y.o. male with   No problem-specific Assessment & Plan notes found for this encounter.  ***   PLAN:   SUMMARY OF ONCOLOGIC HISTORY: Oncology History Overview Note   Cancer Staging  Prostate cancer [facility]) Staging form: Prostate, AJCC 8th Edition - Clinical stage from 09/27/2021: Stage IVB (cTX, cNX, cM1b) - Signed by Creig Hines, MD on 09/27/2021     Prostate cancer  08/01/2020 Initial Diagnosis   Prostate cancer (HCC)   09/27/2021 Cancer Staging   Staging form: Prostate, AJCC 8th Edition - Clinical stage from 09/27/2021: Stage IVB (cTX, cNX, cM1b) - Signed by Creig Hines, MD on 09/27/2021   01/13/2023 Imaging    IMPRESSION: 1. Widespread bony metastatic disease involving axial and appendicular skeleton with marked increased radiotracer accumulation. Some areas with sclerosis and other areas with lucency or without CT correlate as discussed. Metastatic disease is much more widespread based on comparison with prior imaging which was not performed with PSMA PET. 2. Enlarging AP window lymph node with moderate radiotracer accumulation, suspicious for metastatic disease. Other lymph node in the RIGHT mediastinum without change with mild radiotracer accumulation. 3. Marked radiotracer accumulation in the prostate. 4. Small lymph nodes scattered throughout the pelvis  with radiotracer accumulation that is mild-to-moderate, nonspecific but suspicious given other findings. Attention on follow-up. This includes bilateral groin lymph nodes largest on the RIGHT. 5. 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 6. Signs of prior colonic resection in the LEFT hemiabdomen.      INTERVAL HISTORY: *** Terry [name] is here for a follow up of  Prostate Cancer . He was last seen by me on 01/14/2023. He presents to the clinic         All other systems were reviewed with the patient and are negative.  MEDICAL HISTORY:  Past Medical History:  Diagnosis Date   Arthritis    Cancer (HCC)    Phreesia 08/04/2020   CHF, chronic (HCC)    diastolic   CKD (chronic kidney disease) stage 3, GFR 30-59 ml/min (HCC)    Diabetes mellitus without complication (HCC)    Dysrhythmia    Hypertension    Hypothyroidism    Lymphedema    Paroxysmal atrial fibrillation (HCC)    Prostate cancer (HCC)    Renal insufficiency     SURGICAL HISTORY: Past Surgical History:  Procedure Laterality Date   COLON SURGERY N/A    Phreesia 08/04/2020    I have reviewed the social history and family history with the patient and they are unchanged from previous note.  ALLERGIES:  is allergic to lisinopril.  MEDICATIONS:  Current Outpatient Medications  Medication Sig Dispense Refill   abiraterone acetate (ZYTIGA) 250 MG tablet Take 4 tablets (1,000 mg total) by mouth daily. Take on an empty stomach 1 hour before or 2 hours after a meal 120 tablet 1   predniSONE (DELTASONE) 5 MG tablet  Take 1 tablet (5 mg total) by mouth daily with breakfast. 90 tablet 1   allopurinol (ZYLOPRIM) 100 MG tablet Take 0.5 tablets (50 mg total) by mouth daily. 30 tablet 6   apixaban (ELIQUIS) 2.5 MG TABS tablet Take 1 tablet (2.5 mg total) by mouth 2 (two) times daily. 60 tablet 11   carvedilol (COREG) 6.25 MG tablet Take 1 tablet (6.25 mg total) by mouth 2 (two) times daily with  a meal. 90 tablet 3   cephALEXin (KEFLEX) 500 MG capsule Take 1 capsule (500 mg total) by mouth 3 (three) times daily. 21 capsule 0   furosemide (LASIX) 40 MG tablet Take 1 tablet (40 mg total) by mouth daily. 30 tablet 3   gabapentin (NEURONTIN) 300 MG capsule Take 1 capsule (300 mg total) by mouth 3 times/day as needed-between meals & bedtime. 30 capsule 3   Iron, Ferrous Sulfate, 325 (65 Fe) MG TABS Take 325 mg by mouth daily. 30 tablet 3   levothyroxine (SYNTHROID) 50 MCG tablet Take 1 tablet (50 mcg total) by mouth daily. 90 tablet 3   losartan (COZAAR) 50 MG tablet Take 1 tablet (50 mg total) by mouth daily. 90 tablet 3   Multiple Vitamins-Minerals (MULTIVITAMINS THER. W/MINERALS) TABS Take 1 tablet by mouth daily.     No current facility-administered medications for this visit.   Facility-Administered Medications Ordered in Other Visits  Medication Dose Route Frequency Provider Last Rate Last Admin   leuprolide (6 Month) (ELIGARD) injection 45 mg  45 mg Subcutaneous Q6 months Creig Hines, MD   45 mg at 12/28/22 1549    PHYSICAL EXAMINATION: ECOG PERFORMANCE STATUS: {CHL ONC ECOG PS:[id number]}  There were no vitals filed for this visit. Wt Readings from Last 3 Encounters:  01/14/23 (!) 310 lb 1.6 oz (140.7 kg)  01/10/23 (!) 310 lb (140.6 kg)  01/06/23 (!) 309 lb (140.2 kg)    {Only keep what was examined. If exam not performed, can use .CEXAM } GENERAL:alert, no distress and comfortable SKIN: skin color, texture, turgor are normal, no rashes or significant lesions EYES: normal, Conjunctiva are pink and non-injected, sclera clear {OROPHARYNX:no exudate, no erythema and lips, buccal mucosa, and tongue normal}  NECK: supple, thyroid normal size, non-tender, without nodularity LYMPH:  no palpable lymphadenopathy in the cervical, axillary {or inguinal} LUNGS: clear to auscultation and percussion with normal breathing effort HEART: regular rate & rhythm and no murmurs and no  lower extremity edema ABDOMEN:abdomen soft, non-tender and normal bowel sounds Musculoskeletal:no cyanosis of digits and no clubbing  NEURO: alert & oriented x 3 with fluent speech, no focal motor/sensory deficits  LABORATORY DATA:  I have reviewed the data as listed    Latest Ref Rng & Units 12/21/2022    9:59 AM 08/31/2022    9:49 AM 07/13/2022    1:12 PM  CBC  WBC 3.8 - 10.8 Thousand/uL 5.8  6.0  6.1   Hemoglobin 13.2 - 17.1 g/dL 81.2  75.1  70.0   Hematocrit 38.5 - 50.0 % 41.3  38.0  35.0   Platelets 140 - 400 Thousand/uL 200  200  249         Latest Ref Rng & Units 12/21/2022    9:59 AM 08/31/2022    9:49 AM 07/13/2022    1:12 PM  CMP  Glucose 65 - 99 mg/dL 174  944  85   BUN 7 - 25 mg/dL 28  25  29    Creatinine 0.70 - 1.22 mg/dL 9.67  1.61  1.86   Sodium 135 - 146 mmol/L 140  139  141   Potassium 3.5 - 5.3 mmol/L 4.3  4.3  4.3   Chloride 98 - 110 mmol/L 106  105  108   CO2 20 - 32 mmol/L 22  25  24    Calcium 8.6 - 10.3 mg/dL 9.9  9.5  9.5   Total Protein 6.1 - 8.1 g/dL 8.1  7.7  7.8   Total Bilirubin 0.2 - 1.2 mg/dL 0.3  0.2  0.3   AST 10 - 35 U/L 13  13  13    ALT 9 - 46 U/L 7  8  7        RADIOGRAPHIC STUDIES: I have personally reviewed the radiological images as listed and agreed with the findings in the report. No results found.    No orders of the defined types were placed in this encounter.  All questions were answered. The patient knows to call the clinic with any problems, questions or concerns. No barriers to learning was detected. The total time spent in the appointment was {CHL ONC TIME VISIT - HWTUU:[id number]}.     Salome Holmes, CMA 02/25/2023   I, Monica Martinez, CMA, am acting as scribe for Malachy Mood, MD.   {Add scribe attestation statement}

## 2023-02-25 NOTE — Telephone Encounter (Signed)
I called the pt and left a VM making him aware of the appointments that he missed today. I will send a staff message to get him rescheduled.   Mordecai Tindol M. CMA

## 2023-03-01 ENCOUNTER — Telehealth: Payer: Self-pay | Admitting: Hematology

## 2023-03-01 NOTE — Progress Notes (Signed)
RN attempted to reach out to patient to assess any navigation needs/barriers since missing med onc appointment on 4/12. No answer, no option to leave voicemail.  RN contact daughter, voicemail box full.  Will mail letter with direct contact information, and follow up to ensure he is reschedule once we are able to contact patient.

## 2023-03-01 NOTE — Telephone Encounter (Signed)
Called to reschedule missed appointments. Will mail out a letter

## 2023-03-02 ENCOUNTER — Other Ambulatory Visit: Payer: Self-pay

## 2023-03-02 ENCOUNTER — Telehealth: Payer: Self-pay | Admitting: Family Medicine

## 2023-03-02 NOTE — Telephone Encounter (Signed)
Called patient to schedule Medicare Annual Wellness Visit (AWV). No voicemail available to leave a message.  Last date of AWV: : 01/22/2022   Please schedule an appointment at any time with Toni Amend, [facility]   If any questions, please contact me at [phone].  Thank you,  Judeth Cornfield,  AMB Clinical Support [facility] AWV Program Direct Dial ??[phone]

## 2023-03-04 NOTE — Progress Notes (Signed)
RN attempted to contact patient to assess any barriers to care to ensure he stays compliant with treatment recommendations.  No answer, no option to leave voicemail.   RN left message with patient's daughter, Misty Stanley.  Pending call back at this time.

## 2023-03-08 ENCOUNTER — Telehealth: Payer: Self-pay | Admitting: Hematology

## 2023-03-08 NOTE — Telephone Encounter (Signed)
Contacted patient to scheduled appointments. Left message for patient to give [place] a call back to scheduled missed appointments.

## 2023-03-10 ENCOUNTER — Telehealth: Payer: Self-pay

## 2023-03-10 NOTE — Progress Notes (Signed)
  Chronic Care Management Note  03/10/2023 Name: Terry [name] MRN: [id number] DOB: Oct 21, 1941  Terry [name] is a 82 y.o. year old male who is a primary care patient of Donita Brooks, MD and is actively engaged with the Chronic Care Management team. I reached out to [facility] by phone today to assist with re-scheduling a follow up visit with the RN Case Manager  Follow up plan: Unsuccessful telephone outreach attempt made. A HIPAA compliant phone message was left for the patient providing contact information and requesting a return call.  The care management team will reach out to the patient again over the next 7 days.  If patient returns call to provider office, please advise to call CCM Care Guide Penne Lash  at [phone]  Penne Lash, RMA Care Guide [facility]  [place], [place] [address] Direct Dial: [phone] [email or web address]

## 2023-03-15 NOTE — Progress Notes (Signed)
RN spoke with patient and he was unaware of recent appointments for lab, MD, and injection.   Patient willing to have rescheduled and RN sent high priority message get patient back with MD.    RN assessed for any additional barriers and he verbalized he could benefit from transportation services for some appointments if he doesn't have someone to take him.  I will place request to have patient contact by transportation coordinator.    RN provided patient with my direct contact information, explained my role, and encouraged him to contact me with any questions or barriers that may arise.

## 2023-03-16 ENCOUNTER — Other Ambulatory Visit (HOSPITAL_COMMUNITY): Payer: Self-pay

## 2023-03-16 ENCOUNTER — Telehealth: Payer: Self-pay | Admitting: Hematology

## 2023-03-16 ENCOUNTER — Other Ambulatory Visit: Payer: Self-pay

## 2023-03-16 ENCOUNTER — Encounter: Payer: Self-pay | Admitting: Oncology

## 2023-03-16 NOTE — Telephone Encounter (Signed)
Contacted patient to scheduled appointments. Left message with appointment details and a call back number if patient had any questions or could not accommodate the time we provided.   

## 2023-03-17 NOTE — Progress Notes (Signed)
RN spoke with patient and confirmed upcoming appointments for 5/3.  Transportation request sent for review to see if we can accommodate for transportation for 5/3 appointment.    Patient aware.  No additional needs at this time.

## 2023-03-18 ENCOUNTER — Inpatient Hospital Stay: Payer: Medicare Other

## 2023-03-18 ENCOUNTER — Other Ambulatory Visit: Payer: Self-pay

## 2023-03-18 ENCOUNTER — Encounter: Payer: Self-pay | Admitting: Hematology

## 2023-03-18 ENCOUNTER — Inpatient Hospital Stay (HOSPITAL_BASED_OUTPATIENT_CLINIC_OR_DEPARTMENT_OTHER): Payer: Medicare Other | Admitting: Hematology

## 2023-03-18 ENCOUNTER — Other Ambulatory Visit (HOSPITAL_COMMUNITY): Payer: Self-pay

## 2023-03-18 ENCOUNTER — Inpatient Hospital Stay: Payer: Medicare Other | Attending: Hematology

## 2023-03-18 VITALS — BP 162/71 | HR 66 | Temp 98.4°F | Resp 18 | Ht 67.0 in | Wt 307.6 lb

## 2023-03-18 DIAGNOSIS — C7951 Secondary malignant neoplasm of bone: Secondary | ICD-10-CM

## 2023-03-18 DIAGNOSIS — Z79899 Other long term (current) drug therapy: Secondary | ICD-10-CM | POA: Diagnosis not present

## 2023-03-18 DIAGNOSIS — C61 Malignant neoplasm of prostate: Secondary | ICD-10-CM

## 2023-03-18 LAB — CMP (CANCER CENTER ONLY)
ALT: 8 U/L (ref 0–44)
AST: 13 U/L — ABNORMAL LOW (ref 15–41)
Albumin: 3.8 g/dL (ref 3.5–5.0)
Alkaline Phosphatase: 76 U/L (ref 38–126)
Anion gap: 6 (ref 5–15)
BUN: 25 mg/dL — ABNORMAL HIGH (ref 8–23)
CO2: 25 mmol/L (ref 22–32)
Calcium: 9.4 mg/dL (ref 8.9–10.3)
Chloride: 111 mmol/L (ref 98–111)
Creatinine: 1.48 mg/dL — ABNORMAL HIGH (ref 0.61–1.24)
GFR, Estimated: 47 mL/min — ABNORMAL LOW (ref >60)
Glucose, Bld: 109 mg/dL — ABNORMAL HIGH (ref 70–99)
Potassium: 4.1 mmol/L (ref 3.5–5.1)
Sodium: 142 mmol/L (ref 135–145)
Total Bilirubin: 0.2 mg/dL — ABNORMAL LOW (ref 0.3–1.2)
Total Protein: 7.6 g/dL (ref 6.5–8.1)

## 2023-03-18 LAB — CBC WITH DIFFERENTIAL (CANCER CENTER ONLY)
Abs Immature Granulocytes: 0.02 10*3/uL (ref 0.00–0.07)
Basophils Absolute: 0 10*3/uL (ref 0.0–0.1)
Basophils Relative: 1 %
Eosinophils Absolute: 0.1 10*3/uL (ref 0.0–0.5)
Eosinophils Relative: 2 %
HCT: 37.5 % — ABNORMAL LOW (ref 39.0–52.0)
Hemoglobin: 11.8 g/dL — ABNORMAL LOW (ref 13.0–17.0)
Immature Granulocytes: 0 %
Lymphocytes Relative: 12 %
Lymphs Abs: 0.8 10*3/uL (ref 0.7–4.0)
MCH: 24.6 pg — ABNORMAL LOW (ref 26.0–34.0)
MCHC: 31.5 g/dL (ref 30.0–36.0)
MCV: 78.1 fL — ABNORMAL LOW (ref 80.0–100.0)
Monocytes Absolute: 0.5 10*3/uL (ref 0.1–1.0)
Monocytes Relative: 7 %
Neutro Abs: 5 10*3/uL (ref 1.7–7.7)
Neutrophils Relative %: 78 %
Platelet Count: 181 10*3/uL (ref 150–400)
RBC: 4.8 MIL/uL (ref 4.22–5.81)
RDW: 17.3 % — ABNORMAL HIGH (ref 11.5–15.5)
WBC Count: 6.4 10*3/uL (ref 4.0–10.5)
nRBC: 0 % (ref 0.0–0.2)

## 2023-03-18 MED ORDER — ABIRATERONE ACETATE 250 MG PO TABS
1000.0000 mg | ORAL_TABLET | Freq: Every day | ORAL | 5 refills | Status: DC
  Filled 2023-03-18: qty 120, 30d supply, fill #0

## 2023-03-18 NOTE — Assessment & Plan Note (Signed)
-  From prostate cancer, -Due to his CKD, he is not a candidate for Zometa, I recommend Xgeva injection every 3 months -Continue calcium and vitamin D supplement   

## 2023-03-18 NOTE — Progress Notes (Signed)
Introduced myself to the patient, in person, as the prostate nurse navigator.  Pt had his follow up Med Onc visit with Dr. Mosetta Putt today.  I gave him my business card and additional information for support group and services offered. Pt encouraged to call with any questions or concerns that may arise.

## 2023-03-18 NOTE — Progress Notes (Signed)
[facility]   Telephone:(336) [phone] Fax:(336) [phone]   Clinic Follow up Note   Patient Care Team: Donita Brooks, MD as PCP - General (Family Medicine) Mariah Milling Tollie Pizza, MD as Consulting Physician (Cardiology) Erroll Luna, [facility] as Pharmacist (Pharmacist) Audrie Gallus, RN as [facility] Care Management Malachy Mood, MD as Consulting Physician (Oncology)  Date of Service:  03/18/2023  CHIEF COMPLAINT: f/u of Prostate Cancer   CURRENT THERAPY:   Zytiga 1000 mg/ 5 mg prednisone daily started in March 2024  ELIGARD Q6 months  Xgeva on hold due to dental clearance \  ASSESSMENT: Terry [name] is a 82 y.o. 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 He has been treated with ADT, but lost follow-up intermittently due to compliant and transportation issue. -last PSA was 74 on 12/21/2022, which has increased from 32.92 years ago -PSMA PET scan on 01/13/2023 showed widespread bone metastasis, and suspicious lymph node metastasis, in addition to hypermetabolism in prostate. -I recommend continue ADT, since he missed ADT treatment in 2023, so his increasing PSA is likely related to untreated disease.  I do not think this is castration resistant disease yet.  Given the extensive bone metastasis, I recommended adding add Zytiga 1000mg  daily/prednisone 5mg  daily to his treatment for better disease control.   -He is prediabetic, not on diabetic medicine, will watch his sugar when he is on low-dose prednisone.  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  He has dental issues, is going to see a new dentist.  I gave him clearance form for [facility], he  will let his new dentist to fill out and fax to [place]. -Continue calcium and vitamin D supplement     PLAN: -Xgeva injection  due 06/2023 -I refill Zytiga  4 tablets once a day. Prednisone 1 tablet once a day. -lab reviewed-CMP pending. -no injection today due to needing Dental clearance. -lab,f/u and injection 3 months  SUMMARY OF ONCOLOGIC HISTORY: Oncology History Overview Note   Cancer Staging  Prostate cancer [facility]) Staging form: Prostate, AJCC 8th Edition - Clinical stage from 09/27/2021: Stage IVB (cTX, cNX, cM1b) - Signed by Creig Hines, MD on 09/27/2021     Prostate cancer (HCC)  08/01/2020 Initial Diagnosis   Prostate cancer (HCC)   09/27/2021 Cancer Staging   Staging form: Prostate, AJCC 8th Edition - Clinical stage from 09/27/2021: Stage IVB (cTX, cNX, cM1b) - Signed by Creig Hines, MD on 09/27/2021   01/13/2023 Imaging    IMPRESSION: 1. Widespread bony metastatic disease involving axial and appendicular skeleton with marked increased radiotracer accumulation. Some areas with sclerosis and other areas with lucency or without CT correlate as discussed. Metastatic disease is much more widespread based on comparison with prior imaging which was not performed with PSMA PET. 2. Enlarging AP window lymph node with moderate radiotracer accumulation, suspicious for metastatic disease. Other lymph node in the RIGHT mediastinum without change with mild radiotracer accumulation. 3. Marked radiotracer accumulation in the prostate. 4. Small lymph nodes scattered throughout the pelvis with radiotracer accumulation that is mild-to-moderate, nonspecific but suspicious given other findings. Attention on follow-up. This includes bilateral groin lymph nodes largest on the RIGHT. 5.  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 6. Signs of prior colonic resection in the LEFT hemiabdomen.      INTERVAL HISTORY:  Terry [name] is here for a follow  up of Prostate Cancer . He was last seen by me on 01/14/2023. He presents to the clinic alone.Pt state he is taking both of the medication Zytiga/prdnisone. Pt state when he first took the medication it was hard to digest, but its a lot better. Pt state that he has a lot of pain in his arms and lower back.    All other systems were reviewed with the patient and are negative.  MEDICAL HISTORY:  Past Medical History:  Diagnosis Date   Arthritis    Cancer (HCC)    Phreesia 08/04/2020   CHF, chronic (HCC)    diastolic   CKD (chronic kidney disease) stage 3, GFR 30-59 ml/min (HCC)    Diabetes mellitus without complication (HCC)    Dysrhythmia    Hypertension    Hypothyroidism    Lymphedema    Paroxysmal atrial fibrillation (HCC)    Prostate cancer (HCC)    Renal insufficiency     SURGICAL HISTORY: Past Surgical History:  Procedure Laterality Date   COLON SURGERY N/A    Phreesia 08/04/2020    I have reviewed the social history and family history with the patient and they are unchanged from previous note.  ALLERGIES:  is allergic to lisinopril.  MEDICATIONS:  Current Outpatient Medications  Medication Sig Dispense Refill   predniSONE (DELTASONE) 5 MG tablet Take 1 tablet (5 mg total) by mouth daily with breakfast. 90 tablet 1   abiraterone acetate (ZYTIGA) 250 MG tablet Take 4 tablets (1,000 mg total) by mouth daily. Take on an empty stomach 1 hour before or 2 hours after a meal 120 tablet 5   allopurinol (ZYLOPRIM) 100 MG tablet Take 0.5 tablets (50 mg total) by mouth daily. 30 tablet 6   apixaban (ELIQUIS) 2.5 MG TABS tablet Take 1 tablet (2.5 mg total) by mouth 2 (two) times daily. 60 tablet 11   carvedilol (COREG) 25 MG tablet Take 25 mg by mouth 2 (two) times daily.     cephALEXin (KEFLEX) 500 MG capsule Take 1 capsule (500 mg total) by mouth 3 (three) times daily. 21 capsule 0   furosemide (LASIX) 40 MG tablet Take 1 tablet (40 mg total) by mouth daily. 30 tablet 3    gabapentin (NEURONTIN) 300 MG capsule Take 1 capsule (300 mg total) by mouth 3 times/day as needed-between meals & bedtime. 30 capsule 3   Iron, Ferrous Sulfate, 325 (65 Fe) MG TABS Take 325 mg by mouth daily. 30 tablet 3   levothyroxine (SYNTHROID) 50 MCG tablet Take 1 tablet (50 mcg total) by mouth daily. 90 tablet 3   losartan (COZAAR) 50 MG tablet Take 1 tablet (50 mg total) by mouth daily. 90 tablet 3   Multiple Vitamins-Minerals (MULTIVITAMINS THER. W/MINERALS) TABS Take 1 tablet by mouth daily.     No current facility-administered medications for this visit.   Facility-Administered Medications Ordered in Other Visits  Medication Dose Route Frequency Provider Last Rate Last Admin   leuprolide (6 Month) (ELIGARD) injection 45 mg  45 mg Subcutaneous Q6 months Creig Hines, MD   45 mg at 12/28/22 1549    PHYSICAL EXAMINATION: ECOG PERFORMANCE STATUS: 2 - Symptomatic, <50% confined to bed  Vitals:   03/18/23 1326  BP: (!) 162/71  Pulse: 66  Resp: 18  Temp:  98.4 F (36.9 C)  SpO2: 94%   Wt Readings from Last 3 Encounters:  03/18/23 (!) 307 lb 9.6 oz (139.5 kg)  01/14/23 (!) 310 lb 1.6 oz (140.7 kg)  01/10/23 (!) 310 lb (140.6 kg)   GENERAL:alert, no distress and comfortable SKIN: skin color normal, no rashes or significant lesions EYES: normal, Conjunctiva are pink and non-injected, sclera clear  NEURO: alert & oriented x 3 with fluent speech    LABORATORY DATA:  I have reviewed the data as listed    Latest Ref Rng & Units 03/18/2023   12:50 PM 12/21/2022    9:59 AM 08/31/2022    9:49 AM  CBC  WBC 4.0 - 10.5 K/uL 6.4  5.8  6.0   Hemoglobin 13.0 - 17.0 g/dL 16.1  09.6  04.5   Hematocrit 39.0 - 52.0 % 37.5  41.3  38.0   Platelets 150 - 400 K/uL 181  200  200         Latest Ref Rng & Units 03/18/2023   12:50 PM 12/21/2022    9:59 AM 08/31/2022    9:49 AM  CMP  Glucose 70 - 99 mg/dL 409  811  914   BUN 8 - 23 mg/dL 25  28  25    Creatinine 0.61 - 1.24 mg/dL 7.82   9.56  2.13   Sodium 135 - 145 mmol/L 142  140  139   Potassium 3.5 - 5.1 mmol/L 4.1  4.3  4.3   Chloride 98 - 111 mmol/L 111  106  105   CO2 22 - 32 mmol/L 25  22  25    Calcium 8.9 - 10.3 mg/dL 9.4  9.9  9.5   Total Protein 6.5 - 8.1 g/dL 7.6  8.1  7.7   Total Bilirubin 0.3 - 1.2 mg/dL 0.2  0.3  0.2   Alkaline Phos 38 - 126 U/L 76     AST 15 - 41 U/L 13  13  13    ALT 0 - 44 U/L 8  7  8        RADIOGRAPHIC STUDIES: I have personally reviewed the radiological images as listed and agreed with the findings in the report. No results found.    No orders of the defined types were placed in this encounter.  All questions were answered. The patient knows to call the clinic with any problems, questions or concerns. No barriers to learning was detected. The total time spent in the appointment was 25 minutes.     Malachy Mood, MD 03/18/2023   Carolin Coy, CMA, am acting as scribe for Malachy Mood, MD.   I have reviewed the above documentation for accuracy and completeness, and I agree with the above.

## 2023-03-18 NOTE — Assessment & Plan Note (Signed)
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 He has been treated with ADT, but lost follow-up intermittently due to compliant and transportation issue. -last PSA was 74 on 12/21/2022, which has increased from 32.92 years ago -PSMA PET scan on 01/13/2023 showed widespread bone metastasis, and suspicious lymph node metastasis, in addition to hypermetabolism in prostate. -I recommend continue ADT, since he missed ADT treatment in 2023, so his increasing PSA is likely related to untreated disease.  I do not think this is castration resistant disease yet.  Given the extensive bone metastasis, I recommended adding add Zytiga 1000mg  daily/prednisone 5mg  daily to his treatment for better disease control.   -He is prediabetic, not on diabetic medicine, will watch his sugar when he is on low-dose prednisone.

## 2023-03-19 LAB — PROSTATE-SPECIFIC AG, SERUM (LABCORP): Prostate Specific Ag, Serum: 8.3 ng/mL — ABNORMAL HIGH (ref 0.0–4.0)

## 2023-03-21 ENCOUNTER — Encounter: Payer: Self-pay | Admitting: General Practice

## 2023-03-21 ENCOUNTER — Telehealth: Payer: Self-pay

## 2023-03-21 NOTE — Telephone Encounter (Addendum)
Called the patient to relay the message below as per Dr. Mosetta Putt, patient voiced full understanding.  ----- Message from Malachy Mood, MD sent at 03/20/2023  8:48 PM EDT ----- Please let pt know his PSA has significantly reduced, indicating good response to therapy, thanks   Malachy Mood

## 2023-03-21 NOTE — Progress Notes (Signed)
[facility] Spiritual Care Note  Attempted call per referral from nursing for additional layer support; left voicemail encouraging return call, and plan to try again tomorrow if needed.   [address] [place], [place], [facility] Pager [phone] Voicemail [phone]

## 2023-03-22 ENCOUNTER — Encounter: Payer: Self-pay | Admitting: General Practice

## 2023-03-22 NOTE — Progress Notes (Signed)
[facility] Spiritual Care Note  Attempted support call again; left voicemail with availability and plan to phone again next week.   [address] [place], [place], [facility] Pager [phone] Voicemail [phone]

## 2023-03-23 ENCOUNTER — Other Ambulatory Visit (HOSPITAL_COMMUNITY): Payer: Self-pay

## 2023-03-24 ENCOUNTER — Other Ambulatory Visit (HOSPITAL_COMMUNITY): Payer: Self-pay

## 2023-03-28 ENCOUNTER — Other Ambulatory Visit (HOSPITAL_COMMUNITY): Payer: Self-pay

## 2023-03-28 ENCOUNTER — Telehealth: Payer: Self-pay | Admitting: *Deleted

## 2023-03-28 ENCOUNTER — Encounter: Payer: Self-pay | Admitting: General Practice

## 2023-03-28 NOTE — Telephone Encounter (Signed)
Received notification from pharmacy that patient has not picked up Zytiga since 01/19/2023. Numerous attempts were made to contact patient and daughter to no avail.   Telephone call and message left for return call.

## 2023-03-28 NOTE — Progress Notes (Signed)
[facility] Spiritual Care Note  Left voicemail with direct number, encouraging return call.  [address] [place], [place], [facility] Pager [phone] Voicemail [phone]

## 2023-03-29 NOTE — Progress Notes (Signed)
  Chronic Care Management Note  03/29/2023 Name: Keyshawn Snide MRN: [id number] DOB: July 17, 1941  Jamaine Lahner is a 82 y.o. year old male who is a primary care patient of Donita Brooks, MD and is actively engaged with the Chronic Care Management team. I reached out to [facility] by phone today to assist with re-scheduling a follow up visit with the RN Case Manager  Follow up plan: Unsuccessful telephone outreach attempt made. A HIPAA compliant phone message was left for the patient providing contact information and requesting a return call.  The care management team will reach out to the patient again over the next 7 days.  If patient returns call to provider office, please advise to call CCM Care Guide Penne Lash  at [phone]  Penne Lash, RMA Care Guide [facility]  [place], [place] [address] Direct Dial: [phone] [email or web address]

## 2023-04-01 ENCOUNTER — Other Ambulatory Visit: Payer: Self-pay

## 2023-04-05 ENCOUNTER — Ambulatory Visit: Payer: Self-pay | Admitting: *Deleted

## 2023-04-05 DIAGNOSIS — I509 Heart failure, unspecified: Secondary | ICD-10-CM

## 2023-04-05 DIAGNOSIS — I10 Essential (primary) hypertension: Secondary | ICD-10-CM

## 2023-04-05 NOTE — Chronic Care Management (AMB) (Signed)
   04/05/2023  Terry [name] Nov 14, 1941 [id number]   Message from care guide, unable to reach x 3 to reschedule follow up. Care plan updated and resolved.  Case closed.  Irving Shows RNC, BSN RN Case Manager [facility] [phone]

## 2023-04-05 NOTE — Progress Notes (Signed)
  Chronic Care Management Note  04/05/2023 Name: Terry [name] MRN: [id number] DOB: 11/01/1941  Terry [name] is a 82 y.o. year old male who is a primary care patient of Donita Brooks, MD and is actively engaged with the Chronic Care Management team. I reached out to [facility] by phone today to assist with re-scheduling a follow up visit with the RN Case Manager  Follow up plan: Unable to make contact on outreach attempts x 3. PCP Donita Brooks, MD notified via routed documentation in medical record.   Penne Lash, RMA Care Guide [facility]  [place], [place] [address] Direct Dial: [phone] [email or web address]

## 2023-04-14 ENCOUNTER — Ambulatory Visit: Payer: Medicare Other | Admitting: Dermatology

## 2023-04-26 ENCOUNTER — Telehealth: Payer: Self-pay

## 2023-04-26 NOTE — Telephone Encounter (Signed)
Pt's daughter called in to ask if pcp would put in an order for pt to get a hospital bed. Please advise.  CB#: Demar Shad [phone]

## 2023-05-06 ENCOUNTER — Ambulatory Visit (INDEPENDENT_AMBULATORY_CARE_PROVIDER_SITE_OTHER): Payer: Medicare Other | Admitting: Family Medicine

## 2023-05-06 ENCOUNTER — Encounter: Payer: Self-pay | Admitting: Family Medicine

## 2023-05-06 VITALS — BP 172/82 | HR 56 | Temp 98.2°F | Ht 67.0 in | Wt 318.0 lb

## 2023-05-06 DIAGNOSIS — I10 Essential (primary) hypertension: Secondary | ICD-10-CM | POA: Diagnosis not present

## 2023-05-06 DIAGNOSIS — R32 Unspecified urinary incontinence: Secondary | ICD-10-CM | POA: Diagnosis not present

## 2023-05-06 DIAGNOSIS — I509 Heart failure, unspecified: Secondary | ICD-10-CM

## 2023-05-06 MED ORDER — CEPHALEXIN 500 MG PO CAPS: 500.0000 mg | ORAL_CAPSULE | Freq: Three times a day (TID) | ORAL | 0 refills | Status: DC

## 2023-05-06 NOTE — Progress Notes (Signed)
Subjective:    Patient ID: Terry Terry [name], male    DOB: June 18, 1941, 82 y.o.   MRN: [id number]  Patient has a history of a retained right ureteral stent right-sided hydronephrosis, CHF with preserved EF,  and chronic kidney disease.  He also has a history of metastatic prostate cancer.  He also has a history of noncompliance with medication therapy.  Patient is supposed to take Lasix 40 mg a day.  He is not taking this due to urinary incontinence.  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  He does not wear his compression wraps as we discussed.  He is here today requesting a hospital bed.  He states that he cannot breathe if he lays flat on his back at night.  He gets extremely short of breath and has to sit up in order to be able to breathe.  He has tried positioning himself with pillows however he is unable to maintain his torso if greater than 30 degrees and eventually has to get out of his bed and go sit in a chair in order to breathe.  He also recently fell out of his bed so he needs to bed with guard rails.  He also has a difficult time getting out of bed.  This is due to morbid obesity and deconditioning.  Electric hospital bed would also help with positioning and facilitate getting the patient out of bed in the morning.   Patient has a history of lymphedema.  Both legs are extremely swollen.  The skin over both legs is split open and several small places.  Each ulcer is roughly the diameter of a dime.  The skin is extremely erythematous and very warm to the touch.  The erythema extends up to his knees bilaterally.  Left is worse than the right.  He reports feeling weak and sick.  He denies any chest pain shortness of breath or dyspnea on exertion.  He states not taking his furosemide.  Not taking his levothyroxine.  He states he is taking his own medication but he does not have it with him Hello yes ma'am  Past Medical History:  Diagnosis Date   Arthritis    Cancer  (HCC)    Phreesia 08/04/2020   CHF, chronic (HCC)    diastolic   CKD (chronic kidney disease) stage 3, GFR 30-59 ml/min (HCC)    Diabetes mellitus without complication (HCC)    Dysrhythmia    Hypertension    Hypothyroidism    Lymphedema    Paroxysmal atrial fibrillation (HCC)    Prostate cancer [facility])    Renal insufficiency    Past Surgical History:  Procedure Laterality Date   COLON SURGERY N/A    Phreesia 08/04/2020   Medication list is uncertain due to non compliance   Allergies  Allergen Reactions   Lisinopril     FACIAL SWELLING   Social History   Socioeconomic History   Marital status: Widowed    Spouse name: Not on file   Number of children: 5   Years of education: 62   Highest education Terry [name]: 12th grade  Occupational History   Not on file  Tobacco Use   Smoking status: Never    Passive exposure: Never   Smokeless tobacco: Never  Vaping Use   Vaping Use: Never used  Substance and Sexual Activity   Alcohol use: Yes    Comment: rarely   Drug use: Yes    Types: Marijuana  Comment: stopped in 2000   Sexual activity: Not Currently  Other Topics Concern   Not on file  Social History Narrative   Lives with son. Daughter lives in [place].   Social Determinants of Health   Financial Resource Strain: Low Risk  (12/17/2022)   Overall Financial Resource Strain (CARDIA)    Difficulty of Paying Living Expenses: Not very hard  Food Insecurity: No Food Insecurity (01/14/2023)   Hunger Vital Sign    Worried About Running Out of Food in the Last Year: Never true    Ran Out of Food in the Last Year: Never true  Transportation Needs: Unmet Transportation Needs (01/14/2023)   PRAPARE - Administrator, Civil Service (Medical): Yes    Lack of Transportation (Non-Medical): No  Physical Activity: Insufficiently Active (12/17/2022)   Exercise Vital Sign    Days of Exercise per Week: 2 days    Minutes of Exercise per Session: 10 min  Stress: No Stress Concern  Present (12/17/2022)   [employer] of Occupational Health - Occupational Stress Questionnaire    Feeling of Stress : Not at all  Social Connections: Socially Isolated (12/17/2022)   Social Connection and Isolation Panel [NHANES]    Frequency of Communication with Friends and Family: More than three times a week    Frequency of Social Gatherings with Friends and Family: Never    Attends Religious Services: Never    Database administrator or Organizations: No    Attends Banker Meetings: Never    Marital Status: Widowed  Intimate Partner Violence: Not At Risk (01/14/2023)   Humiliation, Afraid, Rape, and Kick questionnaire    Fear of Current or Ex-Partner: No    Emotionally Abused: No    Physically Abused: No    Sexually Abused: No    Past Medical History:  Diagnosis Date   Arthritis    Cancer (HCC)    Phreesia 08/04/2020   CHF, chronic (HCC)    diastolic   CKD (chronic kidney disease) stage 3, GFR 30-59 ml/min (HCC)    Diabetes mellitus without complication (HCC)    Dysrhythmia    Hypertension    Hypothyroidism    Lymphedema    Paroxysmal atrial fibrillation (HCC)    Prostate cancer (HCC)    Renal insufficiency       Review of Systems     Objective:   Physical Exam Vitals reviewed.  Constitutional:      General: He is not in acute distress.    Appearance: He is obese. He is not ill-appearing, toxic-appearing or diaphoretic.  HENT:     Head: Normocephalic and atraumatic.     Nose: Nose normal. No congestion or rhinorrhea.     Mouth/Throat:     Pharynx: No oropharyngeal exudate or posterior oropharyngeal erythema.  Eyes:     Extraocular Movements: Extraocular movements intact.     Conjunctiva/sclera: Conjunctivae normal.     Pupils: Pupils are equal, round, and reactive to light.  Cardiovascular:     Rate and Rhythm: Normal rate and regular rhythm.     Heart sounds: Normal heart sounds. No murmur heard.    No friction rub. No gallop.  Pulmonary:      Effort: Pulmonary effort is normal. No respiratory distress.     Breath sounds: Normal breath sounds. No stridor. No wheezing, rhonchi or rales.  Chest:     Chest wall: No tenderness.  Abdominal:     General: Abdomen is flat. Bowel sounds are  normal. There is no distension.     Palpations: Abdomen is soft. There is no mass.     Tenderness: There is no abdominal tenderness. There is no guarding or rebound.     Hernia: No hernia is present.  Musculoskeletal:        General: Swelling present.     Right lower leg: Edema present.     Left lower leg: Edema present.  Neurological:     General: No focal deficit present.     Mental Status: He is alert and oriented to person, place, and time. Mental status is at baseline.     Cranial Nerves: No cranial nerve deficit.     Sensory: No sensory deficit.     Motor: No weakness.     Coordination: Coordination normal.     Gait: Gait normal.       Assessment & Plan:  Congestive heart failure, unspecified HF chronicity, unspecified heart failure type [facility])  Essential hypertension  Urinary incontinence, unspecified type Patient requires an electric hospital bed.  He needs this because he requires positioning not feasible in regular bed.  He needs to elevate the head of his bed greater than 30 degrees to allow him to breathe so that he can sleep.  He has severe orthopnea.  Partly this is due to congestive heart failure with preserved ejection fraction.  Partly this is due to fluid overload from noncompliance with diuretic therapy.  Partly this is due to obesity hypoventilation syndrome.  He also needs an electric hospital bed to help his family get him up and out of bed every morning due to his weakness and deconditioning.  I will put an order hospital bed.  Also stressed the patient that he needs to resume Lasix 40 mg a day to prevent pulmonary edema and help reduce his blood pressure which is due to fluid overload on exam today.  Patient does not  take his Lasix due to urinary incontinence so I will try to get him set up with condom catheters

## 2023-05-09 ENCOUNTER — Other Ambulatory Visit: Payer: Self-pay

## 2023-05-09 DIAGNOSIS — R32 Unspecified urinary incontinence: Secondary | ICD-10-CM | POA: Insufficient documentation

## 2023-05-12 ENCOUNTER — Telehealth: Payer: Self-pay

## 2023-05-12 NOTE — Telephone Encounter (Signed)
Brant with [employer] called in to get some info from nurse about the some equipment that is needed for pt. [employer] needs pt demographic info and the size needed for pt. This info can be called in by nurse to this number please [phone] fax [phone].

## 2023-05-16 ENCOUNTER — Telehealth: Payer: Self-pay | Admitting: Family Medicine

## 2023-05-16 NOTE — Telephone Encounter (Signed)
Patient specifically needs an [employer], and tape. Please see previous message in thread for additional information.

## 2023-05-16 NOTE — Telephone Encounter (Signed)
Patient's daughter Misty Stanley called to follow up on orders for bariatric bed and medical supplies. Requesting call back from nurse to discuss.  Please advise Misty Stanley at [phone].

## 2023-05-16 NOTE — Telephone Encounter (Signed)
Prescription Request  05/16/2023  LOV: 05/06/2023  What is the name of the medication or equipment?   traMADol (ULTRAM) 50 MG tablet [[id number]]  ENDED   Have you contacted your pharmacy to request a refill? Yes   Which pharmacy would you like this sent to?  [id number] - [place] ([place]), [place] - [address] [place]) [place] [address] Phone: [phone] Fax: [phone]    Patient notified that their request is being sent to the clinical staff for review and that they should receive a response within 2 business days.   Please advise Misty Stanley when refill sent at [phone].

## 2023-05-18 ENCOUNTER — Emergency Department (HOSPITAL_COMMUNITY)
Admission: EM | Admit: 2023-05-18 | Discharge: 2023-05-19 | Disposition: A | Discharge status: Home / Self Care | Payer: Medicare Other | Attending: Emergency Medicine | Admitting: Emergency Medicine

## 2023-05-18 ENCOUNTER — Emergency Department (HOSPITAL_COMMUNITY): Payer: Medicare Other

## 2023-05-18 ENCOUNTER — Other Ambulatory Visit: Payer: Self-pay

## 2023-05-18 ENCOUNTER — Encounter (HOSPITAL_COMMUNITY): Payer: Self-pay | Admitting: Emergency Medicine

## 2023-05-18 DIAGNOSIS — Z79899 Other long term (current) drug therapy: Secondary | ICD-10-CM | POA: Insufficient documentation

## 2023-05-18 DIAGNOSIS — I130 Hypertensive heart and chronic kidney disease with heart failure and stage 1 through stage 4 chronic kidney disease, or unspecified chronic kidney disease: Secondary | ICD-10-CM | POA: Diagnosis not present

## 2023-05-18 DIAGNOSIS — I158 Other secondary hypertension: Secondary | ICD-10-CM | POA: Insufficient documentation

## 2023-05-18 DIAGNOSIS — Z9181 History of falling: Secondary | ICD-10-CM | POA: Diagnosis not present

## 2023-05-18 DIAGNOSIS — Z7901 Long term (current) use of anticoagulants: Secondary | ICD-10-CM | POA: Insufficient documentation

## 2023-05-18 DIAGNOSIS — N183 Chronic kidney disease, stage 3 unspecified: Secondary | ICD-10-CM | POA: Diagnosis not present

## 2023-05-18 DIAGNOSIS — M6281 Muscle weakness (generalized): Secondary | ICD-10-CM | POA: Insufficient documentation

## 2023-05-18 DIAGNOSIS — R2681 Unsteadiness on feet: Secondary | ICD-10-CM | POA: Insufficient documentation

## 2023-05-18 DIAGNOSIS — I509 Heart failure, unspecified: Secondary | ICD-10-CM | POA: Insufficient documentation

## 2023-05-18 DIAGNOSIS — R0602 Shortness of breath: Secondary | ICD-10-CM | POA: Diagnosis present

## 2023-05-18 LAB — URINALYSIS, ROUTINE W REFLEX MICROSCOPIC
Bilirubin Urine: NEGATIVE
Glucose, UA: NEGATIVE mg/dL
Ketones, ur: NEGATIVE mg/dL
Nitrite: NEGATIVE
Protein, ur: 30 mg/dL — AB
Specific Gravity, Urine: 1.012 (ref 1.005–1.030)
WBC, UA: >50 WBC/hpf (ref 0–5)
pH: 6 (ref 5.0–8.0)

## 2023-05-18 LAB — COMPREHENSIVE METABOLIC PANEL
ALT: 13 U/L (ref 0–44)
AST: 16 U/L (ref 15–41)
Albumin: 3.4 g/dL — ABNORMAL LOW (ref 3.5–5.0)
Alkaline Phosphatase: 70 U/L (ref 38–126)
Anion gap: 12 (ref 5–15)
BUN: 28 mg/dL — ABNORMAL HIGH (ref 8–23)
CO2: 26 mmol/L (ref 22–32)
Calcium: 9.3 mg/dL (ref 8.9–10.3)
Chloride: 104 mmol/L (ref 98–111)
Creatinine, Ser: 1.66 mg/dL — ABNORMAL HIGH (ref 0.61–1.24)
GFR, Estimated: 41 mL/min — ABNORMAL LOW (ref >60)
Glucose, Bld: 66 mg/dL — ABNORMAL LOW (ref 70–99)
Potassium: 4.3 mmol/L (ref 3.5–5.1)
Sodium: 142 mmol/L (ref 135–145)
Total Bilirubin: 0.3 mg/dL (ref 0.3–1.2)
Total Protein: 7.5 g/dL (ref 6.5–8.1)

## 2023-05-18 LAB — BRAIN NATRIURETIC PEPTIDE: B Natriuretic Peptide: 92.1 pg/mL (ref 0.0–100.0)

## 2023-05-18 LAB — TROPONIN I (HIGH SENSITIVITY): Troponin I (High Sensitivity): 11 ng/L (ref <18)

## 2023-05-18 LAB — CBC
HCT: 39.9 % (ref 39.0–52.0)
Hemoglobin: 11.7 g/dL — ABNORMAL LOW (ref 13.0–17.0)
MCH: 24 pg — ABNORMAL LOW (ref 26.0–34.0)
MCHC: 29.3 g/dL — ABNORMAL LOW (ref 30.0–36.0)
MCV: 81.9 fL (ref 80.0–100.0)
Platelets: 170 10*3/uL (ref 150–400)
RBC: 4.87 MIL/uL (ref 4.22–5.81)
RDW: 17.5 % — ABNORMAL HIGH (ref 11.5–15.5)
WBC: 8.4 10*3/uL (ref 4.0–10.5)
nRBC: 0 % (ref 0.0–0.2)

## 2023-05-18 MED ORDER — LOSARTAN POTASSIUM 50 MG PO TABS
75.0000 mg | ORAL_TABLET | Freq: Every day | ORAL | Status: DC
  Administered 2023-05-18 – 2023-05-19 (×2): 75 mg via ORAL
  Filled 2023-05-18 (×2): qty 2

## 2023-05-18 NOTE — Care Management (Signed)
ED RNCM spoke with daughter Victory Feickert [phone] concerning [facility] recommendations.  Discussed the disciplines ordered, Daughter requesting he be evaluated by PT prior discharge from the ED.  EDP is aware PT eval order placed, patient awaiting the eval.

## 2023-05-18 NOTE — ED Provider Notes (Signed)
[place] EMERGENCY DEPARTMENT AT [facility] Provider Note   CSN: [id number] Arrival date & time: 05/18/23  1357     History  Chief Complaint  Patient presents with   Hypertension    Terry [name] is a 82 y.o. male.  Patient is an 82 year old male with past history significant for CHF, HTN, CKD3, Paroxysmal Afib presenting with hypertension. Patient reports his medications became mixed up at home and he did not take his regular Coreg or losartan today. Patient's family took blood pressure at home as 220/116 and called EMS. BP at hospital has since decreased to 180/90. Patient has been experiencing generalized fatigue and weakness for last couple weeks. Also reports shortness of breath on exertion and increased lower extremity swelling. Denies chest pain, blurry vision, focal weakness, drooping face, slurred speech, loss of urination.    Hypertension Associated symptoms include shortness of breath. Pertinent negatives include no chest pain and no headaches.       Home Medications Prior to Admission medications   Medication Sig Start Date End Date Taking? Authorizing Provider  abiraterone acetate (ZYTIGA) 250 MG tablet Take 4 tablets (1,000 mg total) by mouth daily. Take on an empty stomach 1 hour before or 2 hours after a meal Patient not taking: Reported on 05/06/2023 03/18/23   Malachy Mood, MD  allopurinol (ZYLOPRIM) 100 MG tablet Take 0.5 tablets (50 mg total) by mouth daily. 07/22/22   Donita Brooks, MD  apixaban (ELIQUIS) 2.5 MG TABS tablet Take 1 tablet (2.5 mg total) by mouth 2 (two) times daily. 07/22/22   Donita Brooks, MD  carvedilol (COREG) 25 MG tablet Take 25 mg by mouth 2 (two) times daily. 02/15/23   [provider]  cephALEXin (KEFLEX) 500 MG capsule Take 1 capsule (500 mg total) by mouth 3 (three) times daily. 05/06/23   Donita Brooks, MD  furosemide (LASIX) 40 MG tablet Take 1 tablet (40 mg total) by mouth daily. 12/24/22   Donita Brooks, MD   gabapentin (NEURONTIN) 300 MG capsule Take 1 capsule (300 mg total) by mouth 3 times/day as needed-between meals & bedtime. 01/10/23   Donita Brooks, MD  Iron, Ferrous Sulfate, 325 (65 Fe) MG TABS Take 325 mg by mouth daily. 07/16/22   Donita Brooks, MD  levothyroxine (SYNTHROID) 50 MCG tablet Take 1 tablet (50 mcg total) by mouth daily. 12/24/22   Donita Brooks, MD  losartan (COZAAR) 50 MG tablet Take 1 tablet (50 mg total) by mouth daily. 10/01/22   Donita Brooks, MD  Multiple Vitamins-Minerals (MULTIVITAMINS THER. W/MINERALS) TABS Take 1 tablet by mouth daily.    [provider]  predniSONE (DELTASONE) 5 MG tablet Take 1 tablet (5 mg total) by mouth daily with breakfast. 01/17/23   Malachy Mood, MD      Allergies    Lisinopril    Review of Systems   Review of Systems  Respiratory:  Positive for shortness of breath.   Cardiovascular:  Positive for leg swelling. Negative for chest pain.  Neurological:  Positive for weakness. Negative for dizziness, speech difficulty, numbness and headaches.    Physical Exam Updated Vital Signs BP (!) 176/102   Pulse (!) 57   Temp 98.4 F (36.9 C) (Oral)   Resp (!) 25   Ht 5\' 7"  (1.702 m)   Wt 136.1 kg   SpO2 98%   BMI 46.99 kg/m  Physical Exam Cardiovascular:     Rate and Rhythm: Normal rate.  Pulmonary:     Effort: Pulmonary effort is normal.     Breath sounds: Normal breath sounds.  Abdominal:     General: There is no distension.     Palpations: Abdomen is soft.  Skin:    General: Skin is warm and dry.  Neurological:     General: No focal deficit present.     Mental Status: He is alert and oriented to person, place, and time.     Cranial Nerves: No cranial nerve deficit.     ED Results / Procedures / Treatments   Labs (all labs ordered are listed, but only abnormal results are displayed) Labs Reviewed  COMPREHENSIVE METABOLIC PANEL - Abnormal; Notable for the following components:      Result Value   Glucose,  Bld 66 (*)    BUN 28 (*)    Creatinine, Ser 1.66 (*)    Albumin 3.4 (*)    GFR, Estimated 41 (*)    All other components within normal limits  CBC - Abnormal; Notable for the following components:   Hemoglobin 11.7 (*)    MCH 24.0 (*)    MCHC 29.3 (*)    RDW 17.5 (*)    All other components within normal limits  URINALYSIS, ROUTINE W REFLEX MICROSCOPIC - Abnormal; Notable for the following components:   APPearance HAZY (*)    Hgb urine dipstick LARGE (*)    Protein, ur 30 (*)    Leukocytes,Ua LARGE (*)    Bacteria, UA RARE (*)    All other components within normal limits  BRAIN NATRIURETIC PEPTIDE  TROPONIN I (HIGH SENSITIVITY)    EKG EKG Interpretation Date/Time:  Wednesday May 18 2023 14:10:11 EDT Ventricular Rate:  76 PR Interval:  143 QRS Duration:  94 QT Interval:  411 QTC Calculation: 463 R Axis:   -5  Text Interpretation: Sinus arrhythmia Abnormal R-wave progression, early transition Confirmed by Glyn Ade ([id number]) on 05/18/2023 5:41:49 PM  Radiology DG Chest Port 1 View  Result Date: 05/18/2023 CLINICAL DATA:  Shortness of breath.  History of prostate cancer. EXAM: PORTABLE CHEST 1 VIEW COMPARISON:  X-ray 10/24/2020.  PET-CT 01/13/2023. FINDINGS: No consolidation, pneumothorax or effusion. No edema. Mild bronchial wall thickening. Slight left basilar atelectasis. Films are under penetrated. Overlapping cardiac leads. Tortuous aorta. Degenerative changes of the spine. Advanced degenerative changes of the left shoulder at the edge of the imaging field. IMPRESSION: Mild left basilar atelectasis. Mild bronchial wall thickening. No consolidation. Under penetrated radiograph Electronically Signed   By: Karen Kays M.D.   On: 05/18/2023 15:00    Procedures Procedures    Medications Ordered in ED Medications  losartan (COZAAR) tablet 75 mg (75 mg Oral Given 05/18/23 1839)    ED Course/ Medical Decision Making/ A&P Clinical Course as of 05/18/23 2021  Wed May 18, 2023  1541 Stable  54 YOM with a HX of CHF/CKD coming in with a chief complaint of HTN  States that he is having medication issues (missed eliquis and Correg)  Endorses generalized weakness.  Exam is benign  CXR negative   A/P: Hypertensive Urgency vs Emergency  MDM: [CC]  1829 Reassessed at bedside.  No evidence of hypertensive emergency.  Patient in no acute distress at this time.  Will plan to take his losartan up to 75 mg daily and plan for 48-hour follow-up with his PCP. [CC]    Clinical Course User Index [CC] Glyn Ade, MD  Medical Decision Making Patient with history of CKD3, CHF, Afib presenting with hypertension. Blood pressure at home was reported to be in 220s systolic, BP at hospital was in the 180s systolic. Patient had no complaints and physical exam and labs were negative for end organ dysfunction. Patient was given 75mg  of losartan, with pressures then decreasing to 160s systolic. Patient has agreed with plan and can be discharged home.   Amount and/or Complexity of Data Reviewed Labs: ordered.          Final Clinical Impression(s) / ED Diagnoses Final diagnoses:  Other secondary hypertension    Rx / DC Orders ED Discharge Orders     None         Monna Fam, MD 05/18/23 2021    Glyn Ade, MD 05/18/23 1610    Glyn Ade, MD 05/18/23 2041

## 2023-05-18 NOTE — ED Notes (Signed)
I tried to get patient blood I didn't have any success the Nurse was informed.

## 2023-05-18 NOTE — ED Notes (Signed)
Phlebotomy at bedside.

## 2023-05-18 NOTE — ED Provider Notes (Signed)
Performed brief evaluation. 82 yo M with hx of CHF and CKD who is presenting with fatigue over several weeks. Also has had some dyspnea on exertion and lower extremity swelling. No chest pain. No bloody stool. Initial orders have been placed. See provider note for additional details.    Rondel Baton, MD 05/18/23 [phone]

## 2023-05-18 NOTE — ED Notes (Signed)
This RN and Duwayne Heck, RN tried to get IV and blood.  Unsuccessful.

## 2023-05-18 NOTE — ED Triage Notes (Signed)
Pt BIB [facility] from home due to hypertension and some weakness.  Initially patient family took BP that was 220/116.  EMS initial 202/96; second take BP 180/100.  Pt normally uses pill divider but this past week medicines got all over the place and is unsure what he has or has not taken.  Pt has not taken his Eliquis for the past two days due to that as well.  Pt does report weakness and edema in lower bilateral extremities.  CBG 123.

## 2023-05-18 NOTE — Discharge Instructions (Addendum)
Follow up with PCP and get bloodwork to assess kidney function in next 48 hours. Continue taking Losartan 75mg  (one and half tablets) daily until PCP visit.

## 2023-05-19 NOTE — Discharge Planning (Signed)
RNCM met with pt at bedside to inform him that home health agency will contact him 24-48 hours post discharge.  RNCM received message to call daughter, Misty Stanley.  RNCM called number provided with no answer and did not leave voicemail as there wasn't a recorded message confirming owner of phone.

## 2023-05-19 NOTE — Discharge Planning (Signed)
Pt currently active with [facility] for Home Health services  as confirmed by [facility] with [place] of [facility].  No DME needs identified at this time.

## 2023-05-19 NOTE — ED Provider Notes (Signed)
Emergency Medicine Observation Re-evaluation Note  Terry [name] is a 82 y.o. male, seen on rounds today.  Pt initially presented to the ED for complaints of Hypertension Currently, the patient is sleeping.  Physical Exam  BP (!) 175/80   Pulse 81   Temp 98.3 F (36.8 C) (Oral)   Resp 15   Ht 5\' 7"  (1.702 m)   Wt 136.1 kg   SpO2 98%   BMI 46.99 kg/m  Physical Exam General: nad Cardiac: rr Lungs: no distress   ED Course / MDM  EKG:EKG Interpretation Date/Time:  Wednesday May 18 2023 14:10:11 EDT Ventricular Rate:  76 PR Interval:  143 QRS Duration:  94 QT Interval:  411 QTC Calculation: 463 R Axis:   -5  Text Interpretation: Sinus arrhythmia Abnormal R-wave progression, early transition Confirmed by Glyn Ade [phone]) on 05/18/2023 5:41:49 PM  I have reviewed the labs performed to date as well as medications administered while in observation.  Recent changes in the last 24 hours include none.  Plan  Current plan is for PT/.OT.    Gwyneth Sprout, MD 05/19/23 [phone]

## 2023-05-19 NOTE — ED Notes (Signed)
Pt dc/d at this time. Pt taken out to daughter vehicle in ED entrance via wheelchair. Pt a&ox4,vss,nad. IV removed and belongings with pt at time of dc.

## 2023-05-19 NOTE — ED Notes (Signed)
Pt at bedside

## 2023-05-19 NOTE — Evaluation (Signed)
Physical Therapy Evaluation Patient Details Name: Terry [name] MRN: [id number] DOB: [birth year] Today's Date: 05/19/2023  History of Present Illness  82 yo male presenting to the ED with generalized weakness and HTN. Admitted for w/u. PMH Afib, HFpEF, HTN, CKD 3, DM, hypothyroidism, prostate CA  Clinical Impression   Terry [name] did well with PT today- only needed Min-ModA for functional mobility and he was able to take sidesteps along EOB with bari-RW. Deferred longer gait distance due to incontinence with no bari-brief available, also HTN/BP spike with basic mobility. Left in ED stretcher with all needs met, nursing staff aware of pt status. Will do well with recommended post-acute care setting as he is very motivated to get stronger and improve his mobility.        Assistance Recommended at Discharge PRN  If plan is discharge home, recommend the following:  Can travel by private vehicle  A little help with walking and/or transfers;Assistance with cooking/housework;Direct supervision/assist for medications management;Assist for transportation;Help with stairs or ramp for entrance;A little help with bathing/dressing/bathroom        Equipment Recommendations Rolling walker (2 wheels);Hospital bed  Recommendations for Other Services       Functional Status Assessment Patient has had a recent decline in their functional status and demonstrates the ability to make significant improvements in function in a reasonable and predictable amount of time.     Precautions / Restrictions Precautions Precautions: Fall;Other (comment) Precaution Comments: incontinence, bring bari-brief Restrictions Weight Bearing Restrictions: No      Mobility  Bed Mobility Overal bed mobility: Needs Assistance Bed Mobility: Supine to Sit, Sit to Supine     Supine to sit: Mod assist, HOB elevated Sit to supine: Mod assist, HOB elevated   General bed mobility comments: Trunk management to get to EOB, BLE  management for back to bed    Transfers Overall transfer level: Needs assistance Equipment used: Rolling walker (2 wheels) Transfers: Sit to/from Stand Sit to Stand: Min assist, From elevated surface           General transfer comment: MinA to boost to standing, MinA for RW management for sidesteps along EOB    Ambulation/Gait               General Gait Details: deferred, incontinence and no bari-brief available in ED room  Stairs            Wheelchair Mobility     Tilt Bed    Modified Rankin (Stroke Patients Only)       Balance Overall balance assessment: History of Falls, Needs assistance Sitting-balance support: Bilateral upper extremity supported Sitting balance-Leahy Scale: Good     Standing balance support: Bilateral upper extremity supported, Reliant on assistive device for balance Standing balance-Leahy Scale: Poor                               Pertinent Vitals/Pain Pain Assessment Pain Assessment: No/denies pain    Home Living Family/patient expects to be discharged to:: Private residence Living Arrangements: Children Available Help at Discharge: Family;Available PRN/intermittently Type of Home: House Home Access: Level entry     Alternate Level Stairs-Number of Steps: full flight Home Layout: Two level;Bed/bath upstairs;1/2 bath on main level Home Equipment: Rolling Walker (2 wheels);Cane - single point;Shower seat      Prior Function Prior Level of Function : Independent/Modified Independent             Mobility Comments: use  of walker, did have a fall out of bed maybe a month ago (able to get back up 3-4 hours later) ADLs Comments: reports no assist needed for bathing/dressing with shower chair     Hand Dominance   Dominant Hand: Right    Extremity/Trunk Assessment   Upper Extremity Assessment Upper Extremity Assessment: Defer to OT evaluation    Lower Extremity Assessment Lower Extremity Assessment:  Generalized weakness    Cervical / Trunk Assessment Cervical / Trunk Assessment: Kyphotic  Communication   Communication: No difficulties  Cognition Arousal/Alertness: Awake/alert Behavior During Therapy: WFL for tasks assessed/performed Overall Cognitive Status: Within Functional Limits for tasks assessed                                 General Comments: very pleasant and cooperative with PT        General Comments General comments (skin integrity, edema, etc.): generally VSS, but did have HTN with systolic in 170s with basic mobility    Exercises     Assessment/Plan    PT Assessment Patient needs continued PT services  PT Problem List Decreased strength;Decreased balance;Decreased mobility;Obesity;Decreased activity tolerance;Decreased coordination       PT Treatment Interventions DME instruction;Functional mobility training;Balance training;Patient/family education;Gait training;Therapeutic activities;Neuromuscular re-education;Stair training;Therapeutic exercise;Wheelchair mobility training    PT Goals (Current goals can be found in the Care Plan section)  Acute Rehab PT Goals Patient Stated Goal: get stronger, get well again PT Goal Formulation: With patient Time For Goal Achievement: 06/02/23 Potential to Achieve Goals: Good    Frequency Min 3X/week     Co-evaluation               AM-PAC PT "6 Clicks" Mobility  Outcome Measure Help needed turning from your back to your side while in a flat bed without using bedrails?: None Help needed moving from lying on your back to sitting on the side of a flat bed without using bedrails?: A Little Help needed moving to and from a bed to a chair (including a wheelchair)?: A Little Help needed standing up from a chair using your arms (e.g., wheelchair or bedside chair)?: A Little Help needed to walk in hospital room?: A Little Help needed climbing 3-5 steps with a railing? : Total 6 Click Score: 17     End of Session Equipment Utilized During Treatment: Gait belt Activity Tolerance: Patient tolerated treatment well Patient left: in bed;with call bell/phone within reach (ED stretcher) Nurse Communication: Mobility status PT Visit Diagnosis: Unsteadiness on feet (R26.81);History of falling (Z91.81);Muscle weakness (generalized) (M62.81);Difficulty in walking, not elsewhere classified (R26.2)    Time: 1610-9604 PT Time Calculation (min) (ACUTE ONLY): 25 min   Charges:   PT Evaluation $PT Eval Low Complexity: 1 Low PT Treatments $Therapeutic Activity: 8-22 mins PT General Charges $$ ACUTE PT VISIT: 1 Visit        Nedra Hai, PT, DPT 05/19/23 9:49 AM

## 2023-05-19 NOTE — ED Notes (Signed)
Pt spoke with [name] (daughter) about plan of discharge and daughter to pick pt up from ED entrance at this time.

## 2023-05-20 ENCOUNTER — Other Ambulatory Visit: Payer: Self-pay | Admitting: Family Medicine

## 2023-05-20 MED ORDER — TRAMADOL HCL 50 MG PO TABS: 50.0000 mg | ORAL_TABLET | Freq: Three times a day (TID) | ORAL | 0 refills | Status: DC | PRN

## 2023-05-20 NOTE — Telephone Encounter (Signed)
Pt is requesting refill for Tramadol, last refill was 01/08/21? Pls advice?

## 2023-05-23 ENCOUNTER — Telehealth: Payer: Self-pay

## 2023-05-23 NOTE — Telephone Encounter (Signed)
My Chart message from pt's daughter:  Devohn was sent to [facility] ER r/t B/p 221/119 at home. He will get PT evaluation and Case Manager to arrange Home Health for SN, PT, MSW.

## 2023-05-23 NOTE — Transitions of Care (Post Inpatient/ED Visit) (Signed)
   05/23/2023  Name: Terry [name] MRN: [id number] DOB: 01-Sep-1941  Today's TOC FU Call Status: Today's TOC FU Call Status:: Unsuccessul Call (1st Attempt) Unsuccessful Call (1st Attempt) Date: 05/23/23  Attempted to reach the patient regarding the most recent Inpatient/ED visit.  Follow Up Plan: Additional outreach attempts will be made to reach the patient to complete the Transitions of Care (Post Inpatient/ED visit) call.   Signature   Woodfin Ganja LPN [facility] Nurse Health Advisor Direct Dial [phone]

## 2023-05-24 ENCOUNTER — Telehealth: Payer: Self-pay | Admitting: *Deleted

## 2023-05-24 DIAGNOSIS — R296 Repeated falls: Secondary | ICD-10-CM

## 2023-05-24 NOTE — Telephone Encounter (Signed)
RNCM returned call to pt daughter, Misty Stanley as requested. No answer, and no identifying message on vm to leave a message.

## 2023-05-24 NOTE — Telephone Encounter (Signed)
Order for home health

## 2023-05-25 ENCOUNTER — Other Ambulatory Visit: Payer: Self-pay

## 2023-05-25 DIAGNOSIS — I509 Heart failure, unspecified: Secondary | ICD-10-CM

## 2023-05-25 DIAGNOSIS — I480 Paroxysmal atrial fibrillation: Secondary | ICD-10-CM

## 2023-05-26 NOTE — Transitions of Care (Post Inpatient/ED Visit) (Signed)
   05/26/2023  Name: Terry [name] MRN: [id number] DOB: 02-15-1941  Today's TOC FU Call Status: Today's TOC FU Call Status:: Unsuccessful Call (2nd Attempt) Unsuccessful Call (1st Attempt) Date: 05/23/23 Unsuccessful Call (2nd Attempt) Date: 05/26/23  Attempted to reach the patient regarding the most recent Inpatient/ED visit.  Follow Up Plan: Additional outreach attempts will be made to reach the patient to complete the Transitions of Care (Post Inpatient/ED visit) call.   Signature   Woodfin Ganja LPN [facility] Nurse Health Advisor Direct Dial [phone]

## 2023-05-30 NOTE — Transitions of Care (Post Inpatient/ED Visit) (Signed)
   05/30/2023  Name: Terry [name] MRN: [id number] DOB: 1941/10/20  Today's TOC FU Call Status: Today's TOC FU Call Status:: Successful TOC FU Call Competed Unsuccessful Call (1st Attempt) Date: 05/23/23 Unsuccessful Call (2nd Attempt) Date: 05/26/23 Unsuccessful Call (3rd Attempt) Date: 05/30/23 [facility] FU Call Complete Date: 05/30/23  Attempted to reach the patient regarding the most recent Inpatient/ED visit.  Follow Up Plan: No further outreach attempts will be made at this time. We have been unable to contact the patient.  Signature   Woodfin Ganja LPN [facility] Nurse Health Advisor Direct Dial [phone]

## 2023-05-31 ENCOUNTER — Emergency Department (HOSPITAL_BASED_OUTPATIENT_CLINIC_OR_DEPARTMENT_OTHER): Payer: Medicare Other

## 2023-05-31 ENCOUNTER — Encounter (HOSPITAL_BASED_OUTPATIENT_CLINIC_OR_DEPARTMENT_OTHER): Payer: Self-pay

## 2023-05-31 ENCOUNTER — Other Ambulatory Visit: Payer: Self-pay

## 2023-05-31 ENCOUNTER — Inpatient Hospital Stay (HOSPITAL_BASED_OUTPATIENT_CLINIC_OR_DEPARTMENT_OTHER)
Admission: EM | Admit: 2023-05-31 | Discharge: 2023-06-06 | DRG: 690 | Disposition: A | Discharge status: Skilled Nursing Facility | Payer: Medicare Other | Attending: Family Medicine | Admitting: Family Medicine

## 2023-05-31 DIAGNOSIS — N183 Chronic kidney disease, stage 3 unspecified: Secondary | ICD-10-CM | POA: Diagnosis present

## 2023-05-31 DIAGNOSIS — Z23 Encounter for immunization: Secondary | ICD-10-CM

## 2023-05-31 DIAGNOSIS — K5900 Constipation, unspecified: Secondary | ICD-10-CM | POA: Diagnosis present

## 2023-05-31 DIAGNOSIS — I10 Essential (primary) hypertension: Secondary | ICD-10-CM | POA: Diagnosis present

## 2023-05-31 DIAGNOSIS — Z79890 Hormone replacement therapy (postmenopausal): Secondary | ICD-10-CM

## 2023-05-31 DIAGNOSIS — Z87440 Personal history of urinary (tract) infections: Secondary | ICD-10-CM

## 2023-05-31 DIAGNOSIS — R5381 Other malaise: Secondary | ICD-10-CM | POA: Diagnosis present

## 2023-05-31 DIAGNOSIS — Z6841 Body Mass Index (BMI) 40.0 and over, adult: Secondary | ICD-10-CM

## 2023-05-31 DIAGNOSIS — I5032 Chronic diastolic (congestive) heart failure: Secondary | ICD-10-CM | POA: Diagnosis present

## 2023-05-31 DIAGNOSIS — Z7952 Long term (current) use of systemic steroids: Secondary | ICD-10-CM

## 2023-05-31 DIAGNOSIS — R350 Frequency of micturition: Secondary | ICD-10-CM | POA: Diagnosis present

## 2023-05-31 DIAGNOSIS — N1831 Chronic kidney disease, stage 3a: Secondary | ICD-10-CM | POA: Diagnosis present

## 2023-05-31 DIAGNOSIS — F32A Depression, unspecified: Secondary | ICD-10-CM | POA: Diagnosis present

## 2023-05-31 DIAGNOSIS — M1990 Unspecified osteoarthritis, unspecified site: Secondary | ICD-10-CM | POA: Diagnosis present

## 2023-05-31 DIAGNOSIS — C61 Malignant neoplasm of prostate: Secondary | ICD-10-CM | POA: Diagnosis present

## 2023-05-31 DIAGNOSIS — R300 Dysuria: Secondary | ICD-10-CM | POA: Diagnosis not present

## 2023-05-31 DIAGNOSIS — E1122 Type 2 diabetes mellitus with diabetic chronic kidney disease: Secondary | ICD-10-CM | POA: Diagnosis present

## 2023-05-31 DIAGNOSIS — F419 Anxiety disorder, unspecified: Secondary | ICD-10-CM | POA: Diagnosis present

## 2023-05-31 DIAGNOSIS — Z8249 Family history of ischemic heart disease and other diseases of the circulatory system: Secondary | ICD-10-CM

## 2023-05-31 DIAGNOSIS — I130 Hypertensive heart and chronic kidney disease with heart failure and stage 1 through stage 4 chronic kidney disease, or unspecified chronic kidney disease: Secondary | ICD-10-CM | POA: Diagnosis present

## 2023-05-31 DIAGNOSIS — Z800 Family history of malignant neoplasm of digestive organs: Secondary | ICD-10-CM

## 2023-05-31 DIAGNOSIS — N390 Urinary tract infection, site not specified: Principal | ICD-10-CM | POA: Diagnosis present

## 2023-05-31 DIAGNOSIS — I509 Heart failure, unspecified: Secondary | ICD-10-CM

## 2023-05-31 DIAGNOSIS — I480 Paroxysmal atrial fibrillation: Secondary | ICD-10-CM | POA: Diagnosis present

## 2023-05-31 DIAGNOSIS — Z79899 Other long term (current) drug therapy: Secondary | ICD-10-CM

## 2023-05-31 DIAGNOSIS — N401 Enlarged prostate with lower urinary tract symptoms: Secondary | ICD-10-CM | POA: Diagnosis present

## 2023-05-31 DIAGNOSIS — E039 Hypothyroidism, unspecified: Secondary | ICD-10-CM | POA: Diagnosis present

## 2023-05-31 DIAGNOSIS — N3000 Acute cystitis without hematuria: Secondary | ICD-10-CM | POA: Diagnosis not present

## 2023-05-31 DIAGNOSIS — E876 Hypokalemia: Secondary | ICD-10-CM | POA: Diagnosis present

## 2023-05-31 DIAGNOSIS — R531 Weakness: Secondary | ICD-10-CM

## 2023-05-31 DIAGNOSIS — R262 Difficulty in walking, not elsewhere classified: Secondary | ICD-10-CM | POA: Diagnosis present

## 2023-05-31 DIAGNOSIS — Z7901 Long term (current) use of anticoagulants: Secondary | ICD-10-CM

## 2023-05-31 DIAGNOSIS — Z888 Allergy status to other drugs, medicaments and biological substances status: Secondary | ICD-10-CM

## 2023-05-31 DIAGNOSIS — F418 Other specified anxiety disorders: Secondary | ICD-10-CM | POA: Diagnosis present

## 2023-05-31 DIAGNOSIS — B952 Enterococcus as the cause of diseases classified elsewhere: Secondary | ICD-10-CM | POA: Diagnosis present

## 2023-05-31 DIAGNOSIS — C7951 Secondary malignant neoplasm of bone: Secondary | ICD-10-CM | POA: Diagnosis present

## 2023-05-31 LAB — URINALYSIS, ROUTINE W REFLEX MICROSCOPIC
Bilirubin Urine: NEGATIVE
Glucose, UA: NEGATIVE mg/dL
Ketones, ur: NEGATIVE mg/dL
Nitrite: NEGATIVE
RBC / HPF: >50 RBC/hpf (ref 0–5)
Specific Gravity, Urine: 1.013 (ref 1.005–1.030)
WBC, UA: >50 WBC/hpf (ref 0–5)
pH: 6.5 (ref 5.0–8.0)

## 2023-05-31 LAB — COMPREHENSIVE METABOLIC PANEL
ALT: 12 U/L (ref 0–44)
AST: 14 U/L — ABNORMAL LOW (ref 15–41)
Albumin: 3.7 g/dL (ref 3.5–5.0)
Alkaline Phosphatase: 60 U/L (ref 38–126)
Anion gap: 8 (ref 5–15)
BUN: 23 mg/dL (ref 8–23)
CO2: 27 mmol/L (ref 22–32)
Calcium: 9.5 mg/dL (ref 8.9–10.3)
Chloride: 107 mmol/L (ref 98–111)
Creatinine, Ser: 1.41 mg/dL — ABNORMAL HIGH (ref 0.61–1.24)
GFR, Estimated: 50 mL/min — ABNORMAL LOW (ref >60)
Glucose, Bld: 97 mg/dL (ref 70–99)
Potassium: 3.7 mmol/L (ref 3.5–5.1)
Sodium: 142 mmol/L (ref 135–145)
Total Bilirubin: 0.6 mg/dL (ref 0.3–1.2)
Total Protein: 7.5 g/dL (ref 6.5–8.1)

## 2023-05-31 LAB — TROPONIN I (HIGH SENSITIVITY)
Troponin I (High Sensitivity): 11 ng/L (ref <18)
Troponin I (High Sensitivity): 11 ng/L (ref <18)

## 2023-05-31 LAB — BRAIN NATRIURETIC PEPTIDE: B Natriuretic Peptide: 66.8 pg/mL (ref 0.0–100.0)

## 2023-05-31 LAB — CBC WITH DIFFERENTIAL/PLATELET
Abs Immature Granulocytes: 0.04 10*3/uL (ref 0.00–0.07)
Basophils Absolute: 0 10*3/uL (ref 0.0–0.1)
Basophils Relative: 0 %
Eosinophils Absolute: 0.1 10*3/uL (ref 0.0–0.5)
Eosinophils Relative: 1 %
HCT: 37.4 % — ABNORMAL LOW (ref 39.0–52.0)
Hemoglobin: 11.6 g/dL — ABNORMAL LOW (ref 13.0–17.0)
Immature Granulocytes: 0 %
Lymphocytes Relative: 10 %
Lymphs Abs: 0.9 10*3/uL (ref 0.7–4.0)
MCH: 24.4 pg — ABNORMAL LOW (ref 26.0–34.0)
MCHC: 31 g/dL (ref 30.0–36.0)
MCV: 78.6 fL — ABNORMAL LOW (ref 80.0–100.0)
Monocytes Absolute: 0.9 10*3/uL (ref 0.1–1.0)
Monocytes Relative: 10 %
Neutro Abs: 7 10*3/uL (ref 1.7–7.7)
Neutrophils Relative %: 79 %
Platelets: 191 10*3/uL (ref 150–400)
RBC: 4.76 MIL/uL (ref 4.22–5.81)
RDW: 17.5 % — ABNORMAL HIGH (ref 11.5–15.5)
WBC: 8.9 10*3/uL (ref 4.0–10.5)
nRBC: 0 % (ref 0.0–0.2)

## 2023-05-31 LAB — MAGNESIUM: Magnesium: 2 mg/dL (ref 1.7–2.4)

## 2023-05-31 MED ORDER — SODIUM CHLORIDE 0.9 % IV SOLN
1.0000 g | Freq: Once | INTRAVENOUS | Status: AC
  Administered 2023-05-31: 1 g via INTRAVENOUS
  Filled 2023-05-31: qty 10

## 2023-05-31 NOTE — ED Triage Notes (Signed)
Patient BIB [facility] from Home.  Endorses Bilateral Lower Leg Weakness below the Knees and some Mild Discomfort to Bilateral Knees. Notes some Dysuria as well with a History of Frequent UTI.  VSS with EMS but hypertensive at 166/90 BP.  NAD Noted during Triage. A&Ox4. GCS 15. BIB Wheelchair.

## 2023-05-31 NOTE — Progress Notes (Signed)
Hospitalist Transfer Note:  Transferring facility: [facility] Requesting provider: Dr. Coralee Pesa (EDP at [facility]) Reason for transfer: admission for further evaluation and management of generalized weakness in the setting of urinary tract infection.   82 year old male with medical history notable for chronic diastolic heart failure, recurrent urinary tract infections, who presented to [facility] ED complaining of generalized weakness over the last 3 to 4 days.  Over that timeframe, he is also noted new onset dysuria as well as increase in urinary frequency.  At baseline, he is reported to be able to ambulate without assistance, but has had progressive difficulty with such over the last few days in the setting of generalized weakness, noting that earlier today, while independently ambulating, he needed to lower himself to the floor, unable to complete his walk, as a consequence of generalized weakness in the absence of any acute focal weakness.  This episode in which the patient lowered himself to the floor did not result in any overt fall and the patient did not hit his head as a consequence of the sequence.  No associated loss of consciousness.  Vital signs in the ED were notable for the following: Afebrile; systolic blood pressures noted to be in the 170s to 190s, with most recent blood pressure reported to be 186/81.  Urinalysis reported to be suggestive of urinary tract infection.   Received a dose of Rocephin in the ED.   Subsequently, I accepted this patient for transfer for inpatient admission to a med/tele bed at [facility] or [facility] (first available) for further work-up and management of the above.       Nursing staff, Please call [facility] Admits & Consults System-Wide number on Amion [phone]) as soon as patient's arrival, so appropriate admitting provider can evaluate the pt.     Newton Pigg, DO Hospitalist

## 2023-05-31 NOTE — ED Notes (Signed)
Pt attempted to provide urine sample but was unable. Pt to press call button when he is able to provide urine.

## 2023-05-31 NOTE — ED Provider Notes (Signed)
[place] EMERGENCY DEPARTMENT AT [facility] Provider Note   CSN: [id number] Arrival date & time: 05/31/23  1623     History  Chief Complaint  Patient presents with   Dysuria    Terry [name] is a 82 y.o. male.  HPI   82 year old male with past medical history of HTN, CHF, paroxysmal A-fib, CKD, DM presents to the emergency department with concern for generalized weakness, urinary frequency and frequent falls.  Patient states over the past couple weeks he is felt generally weak.  He is not sleeping well, his knees give out, resulting in multiple falls.  No head injuries with these falls.  He presents today with worsening weakness and now urinary frequency and sometimes hesitancy.  History of UTIs in the past.  Patient states that he gets short of breath easily but feels like the swelling in his lower extremities is stable.  He states he wakes up multiple times at night and has to sit on the edge of the bed.  No noted fever.  No vomiting/diarrhea.  Home Medications Prior to Admission medications   Medication Sig Start Date End Date Taking? Authorizing Provider  abiraterone acetate (ZYTIGA) 250 MG tablet Take 4 tablets (1,000 mg total) by mouth daily. Take on an empty stomach 1 hour before or 2 hours after a meal Patient not taking: Reported on 05/06/2023 03/18/23   Malachy Mood, MD  allopurinol (ZYLOPRIM) 100 MG tablet Take 0.5 tablets (50 mg total) by mouth daily. 07/22/22   Donita Brooks, MD  apixaban (ELIQUIS) 2.5 MG TABS tablet Take 1 tablet (2.5 mg total) by mouth 2 (two) times daily. 07/22/22   Donita Brooks, MD  carvedilol (COREG) 25 MG tablet Take 25 mg by mouth 2 (two) times daily. 02/15/23   [provider]  cephALEXin (KEFLEX) 500 MG capsule Take 1 capsule (500 mg total) by mouth 3 (three) times daily. 05/06/23   Donita Brooks, MD  furosemide (LASIX) 40 MG tablet Take 1 tablet (40 mg total) by mouth daily. 12/24/22   Donita Brooks, MD  gabapentin  (NEURONTIN) 300 MG capsule Take 1 capsule (300 mg total) by mouth 3 times/day as needed-between meals & bedtime. 01/10/23   Donita Brooks, MD  Iron, Ferrous Sulfate, 325 (65 Fe) MG TABS Take 325 mg by mouth daily. 07/16/22   Donita Brooks, MD  levothyroxine (SYNTHROID) 50 MCG tablet Take 1 tablet (50 mcg total) by mouth daily. 12/24/22   Donita Brooks, MD  losartan (COZAAR) 50 MG tablet Take 1 tablet (50 mg total) by mouth daily. 10/01/22   Donita Brooks, MD  Multiple Vitamins-Minerals (MULTIVITAMINS THER. W/MINERALS) TABS Take 1 tablet by mouth daily.    [provider]  predniSONE (DELTASONE) 5 MG tablet Take 1 tablet (5 mg total) by mouth daily with breakfast. 01/17/23   Malachy Mood, MD  traMADol (ULTRAM) 50 MG tablet Take 1 tablet (50 mg total) by mouth every 8 (eight) hours as needed. 05/20/23 06/19/23  Donita Brooks, MD      Allergies    Lisinopril    Review of Systems   Review of Systems  Constitutional:  Positive for fatigue. Negative for appetite change and fever.  Respiratory:  Positive for shortness of breath.   Cardiovascular:  Positive for leg swelling. Negative for chest pain and palpitations.  Gastrointestinal:  Negative for abdominal pain, diarrhea and vomiting.  Genitourinary:  Positive for difficulty urinating and frequency. Negative for flank pain.  Skin:  Negative for rash.  Neurological:  Negative for headaches.    Physical Exam Updated Vital Signs BP (!) 190/71 (BP Location: Right Arm)   Pulse 68   Temp 98.7 F (37.1 C) (Oral)   Resp 18   Ht 5\' 7"  (1.702 m)   Wt (!) 142.9 kg   SpO2 97%   BMI 49.34 kg/m  Physical Exam Vitals and nursing note reviewed.  Constitutional:      General: He is not in acute distress.    Appearance: Normal appearance.  HENT:     Head: Normocephalic.     Mouth/Throat:     Mouth: Mucous membranes are moist.  Eyes:     Extraocular Movements: Extraocular movements intact.  Cardiovascular:     Rate and Rhythm:  Normal rate.  Pulmonary:     Effort: Pulmonary effort is normal. No respiratory distress.     Comments: Diminished at bases with scattered rales Abdominal:     General: There is no distension.     Palpations: Abdomen is soft.     Tenderness: There is no abdominal tenderness. There is no guarding.  Skin:    General: Skin is warm.  Neurological:     Mental Status: He is alert and oriented to person, place, and time. Mental status is at baseline.  Psychiatric:        Mood and Affect: Mood normal.     ED Results / Procedures / Treatments   Labs (all labs ordered are listed, but only abnormal results are displayed) Labs Reviewed  CBC WITH DIFFERENTIAL/PLATELET  COMPREHENSIVE METABOLIC PANEL  BRAIN NATRIURETIC PEPTIDE  URINALYSIS, ROUTINE W REFLEX MICROSCOPIC  MAGNESIUM  TROPONIN I (HIGH SENSITIVITY)    EKG None  Radiology No results found.  Procedures Procedures    Medications Ordered in ED Medications - No data to display  ED Course/ Medical Decision Making/ A&P                             Medical Decision Making Amount and/or Complexity of Data Reviewed Labs: ordered. Radiology: ordered.  Risk Decision regarding hospitalization.   82 year old male presents emergency department with weakness, dysuria.  Has history of frequent UTIs.  States he has become so weak that his legs give out, he had a low mechanism fall earlier without admitted traumatic injury.  Slightly hypertensive on arrival but otherwise normal vitals.  Blood work is reassuring, baseline elevated creatinine.  Cardiac workup is negative.  Chest x-ray shows cardiomegaly.  Urinalysis has large amount of leukocytes, bacteria with white blood cells greater than 50, minimal squamous cells.  Concerning for UTI.  No s/s sepsis.  Concern from patient and family that he is not able to walk for baseline.  It took 3 nurse assist to get the patient standing and try to take a step.  He was otherwise too weak to  ambulate.  He will require admission for treatment of UTI, Rocephin ordered.  Patient and family agree with admission plan.  Patients evaluation and results requires admission for further treatment and care.  Spoke with hospitalist, reviewed patient's ED course and they accept admission.  Patient agrees with admission plan, offers no new complaints and is stable/unchanged at time of admit.        Final Clinical Impression(s) / ED Diagnoses Final diagnoses:  None    Rx / DC Orders ED Discharge Orders     None  Houa, Ackert, DO 05/31/23 2319

## 2023-05-31 NOTE — ED Notes (Signed)
Pt. Drinking water in attempt to provide a urine specimen..macular degeneration notified of loss of IV

## 2023-05-31 NOTE — ED Notes (Signed)
Attempted to ambulate pt. At MD's request..the patient. Able to stand with heavy assist, but became dizzy when standing and couldn't move forward. MD notified

## 2023-05-31 NOTE — ED Notes (Signed)
Does note attempting to get OOB today when he fell and slid onto the floor. Does not note any discomfort from fall or Head Injury or LOC. Does take Eliquis.

## 2023-06-01 DIAGNOSIS — K5900 Constipation, unspecified: Secondary | ICD-10-CM | POA: Diagnosis present

## 2023-06-01 DIAGNOSIS — N3000 Acute cystitis without hematuria: Secondary | ICD-10-CM

## 2023-06-01 DIAGNOSIS — F32A Depression, unspecified: Secondary | ICD-10-CM | POA: Diagnosis present

## 2023-06-01 DIAGNOSIS — R531 Weakness: Secondary | ICD-10-CM

## 2023-06-01 DIAGNOSIS — R262 Difficulty in walking, not elsewhere classified: Secondary | ICD-10-CM

## 2023-06-01 DIAGNOSIS — I5032 Chronic diastolic (congestive) heart failure: Secondary | ICD-10-CM | POA: Diagnosis present

## 2023-06-01 DIAGNOSIS — M1990 Unspecified osteoarthritis, unspecified site: Secondary | ICD-10-CM | POA: Diagnosis present

## 2023-06-01 DIAGNOSIS — C61 Malignant neoplasm of prostate: Secondary | ICD-10-CM | POA: Diagnosis present

## 2023-06-01 DIAGNOSIS — C7951 Secondary malignant neoplasm of bone: Secondary | ICD-10-CM | POA: Diagnosis present

## 2023-06-01 DIAGNOSIS — R5381 Other malaise: Secondary | ICD-10-CM

## 2023-06-01 DIAGNOSIS — Z23 Encounter for immunization: Secondary | ICD-10-CM | POA: Diagnosis not present

## 2023-06-01 DIAGNOSIS — E039 Hypothyroidism, unspecified: Secondary | ICD-10-CM | POA: Diagnosis present

## 2023-06-01 DIAGNOSIS — Z800 Family history of malignant neoplasm of digestive organs: Secondary | ICD-10-CM | POA: Diagnosis not present

## 2023-06-01 DIAGNOSIS — Z87440 Personal history of urinary (tract) infections: Secondary | ICD-10-CM | POA: Diagnosis not present

## 2023-06-01 DIAGNOSIS — R319 Hematuria, unspecified: Secondary | ICD-10-CM

## 2023-06-01 DIAGNOSIS — N390 Urinary tract infection, site not specified: Secondary | ICD-10-CM

## 2023-06-01 DIAGNOSIS — F419 Anxiety disorder, unspecified: Secondary | ICD-10-CM | POA: Diagnosis present

## 2023-06-01 DIAGNOSIS — Z6841 Body Mass Index (BMI) 40.0 and over, adult: Secondary | ICD-10-CM | POA: Diagnosis not present

## 2023-06-01 DIAGNOSIS — I130 Hypertensive heart and chronic kidney disease with heart failure and stage 1 through stage 4 chronic kidney disease, or unspecified chronic kidney disease: Secondary | ICD-10-CM | POA: Diagnosis present

## 2023-06-01 DIAGNOSIS — B952 Enterococcus as the cause of diseases classified elsewhere: Secondary | ICD-10-CM | POA: Diagnosis present

## 2023-06-01 DIAGNOSIS — R300 Dysuria: Secondary | ICD-10-CM | POA: Diagnosis present

## 2023-06-01 DIAGNOSIS — Z79890 Hormone replacement therapy (postmenopausal): Secondary | ICD-10-CM | POA: Diagnosis not present

## 2023-06-01 DIAGNOSIS — E1122 Type 2 diabetes mellitus with diabetic chronic kidney disease: Secondary | ICD-10-CM | POA: Diagnosis present

## 2023-06-01 DIAGNOSIS — E876 Hypokalemia: Secondary | ICD-10-CM | POA: Diagnosis present

## 2023-06-01 DIAGNOSIS — Z79899 Other long term (current) drug therapy: Secondary | ICD-10-CM | POA: Diagnosis not present

## 2023-06-01 DIAGNOSIS — Z8249 Family history of ischemic heart disease and other diseases of the circulatory system: Secondary | ICD-10-CM | POA: Diagnosis not present

## 2023-06-01 DIAGNOSIS — N401 Enlarged prostate with lower urinary tract symptoms: Secondary | ICD-10-CM | POA: Diagnosis present

## 2023-06-01 DIAGNOSIS — N1831 Chronic kidney disease, stage 3a: Secondary | ICD-10-CM | POA: Diagnosis present

## 2023-06-01 DIAGNOSIS — I480 Paroxysmal atrial fibrillation: Secondary | ICD-10-CM | POA: Diagnosis present

## 2023-06-01 LAB — CBC
HCT: 40.5 % (ref 39.0–52.0)
Hemoglobin: 12 g/dL — ABNORMAL LOW (ref 13.0–17.0)
MCH: 24.1 pg — ABNORMAL LOW (ref 26.0–34.0)
MCHC: 29.6 g/dL — ABNORMAL LOW (ref 30.0–36.0)
MCV: 81.5 fL (ref 80.0–100.0)
Platelets: 186 10*3/uL (ref 150–400)
RBC: 4.97 MIL/uL (ref 4.22–5.81)
RDW: 17.8 % — ABNORMAL HIGH (ref 11.5–15.5)
WBC: 6.4 10*3/uL (ref 4.0–10.5)
nRBC: 0 % (ref 0.0–0.2)

## 2023-06-01 LAB — CREATININE, SERUM
Creatinine, Ser: 1.3 mg/dL — ABNORMAL HIGH (ref 0.61–1.24)
GFR, Estimated: 55 mL/min — ABNORMAL LOW (ref >60)

## 2023-06-01 MED ORDER — HYDRALAZINE HCL 20 MG/ML IJ SOLN
10.0000 mg | INTRAMUSCULAR | Status: DC | PRN
  Administered 2023-06-01 – 2023-06-02 (×3): 10 mg via INTRAVENOUS
  Filled 2023-06-01 (×3): qty 1

## 2023-06-01 MED ORDER — PNEUMOCOCCAL 20-VAL CONJ VACC 0.5 ML IM SUSY
0.5000 mL | PREFILLED_SYRINGE | INTRAMUSCULAR | Status: AC
  Administered 2023-06-02: 0.5 mL via INTRAMUSCULAR
  Filled 2023-06-01: qty 0.5

## 2023-06-01 MED ORDER — SODIUM CHLORIDE 0.9 % IV SOLN
1.0000 g | INTRAVENOUS | Status: DC
  Administered 2023-06-01 – 2023-06-02 (×2): 1 g via INTRAVENOUS
  Filled 2023-06-01 (×2): qty 10

## 2023-06-01 MED ORDER — PREDNISONE 5 MG PO TABS
5.0000 mg | ORAL_TABLET | Freq: Every day | ORAL | Status: DC
  Administered 2023-06-01 – 2023-06-06 (×6): 5 mg via ORAL
  Filled 2023-06-01 (×6): qty 1

## 2023-06-01 MED ORDER — ENOXAPARIN SODIUM 40 MG/0.4ML IJ SOSY
40.0000 mg | PREFILLED_SYRINGE | INTRAMUSCULAR | Status: DC
  Administered 2023-06-01: 40 mg via SUBCUTANEOUS
  Filled 2023-06-01: qty 0.4

## 2023-06-01 MED ORDER — CARVEDILOL 25 MG PO TABS
25.0000 mg | ORAL_TABLET | Freq: Two times a day (BID) | ORAL | Status: DC
  Administered 2023-06-01 – 2023-06-06 (×11): 25 mg via ORAL
  Filled 2023-06-01 (×11): qty 1

## 2023-06-01 MED ORDER — HYDRALAZINE HCL 20 MG/ML IJ SOLN
5.0000 mg | INTRAMUSCULAR | Status: DC | PRN
  Administered 2023-06-01: 5 mg via INTRAVENOUS
  Filled 2023-06-01: qty 1

## 2023-06-01 MED ORDER — LEVOTHYROXINE SODIUM 50 MCG PO TABS
50.0000 ug | ORAL_TABLET | Freq: Every day | ORAL | Status: DC
  Administered 2023-06-01 – 2023-06-06 (×6): 50 ug via ORAL
  Filled 2023-06-01 (×6): qty 1

## 2023-06-01 MED ORDER — ALLOPURINOL 100 MG PO TABS
50.0000 mg | ORAL_TABLET | Freq: Every day | ORAL | Status: DC
  Administered 2023-06-01 – 2023-06-06 (×6): 50 mg via ORAL
  Filled 2023-06-01 (×6): qty 1

## 2023-06-01 NOTE — Progress Notes (Signed)
Chaplain engaged in an initial visit with Terry [name]. Terry [name] was able to share about his grief, future desires and wants, and explore his theology and faith. Terry [name] shared about his healthcare journey and how that has been surrounded by the loss of significant people in his life like his wife, and his parents in a tragic boating accident. Chaplain and Terry [name] talked about the ways grief and trauma can manifest in the body. He voiced that the loss of his wife really changed him and his life.   Outside of discussing monumental experiences and events in his life, Terry [name] shared his deep love for the Bible and God. He has the desire to evangelize and share the gospel with others. Specifically, he wants to help addicts and those that may be homeless. A way he gives back now is donating to various missions to support marginalized communities. Chaplain affirmed his fervor and passion for God, as well as his call to ministry. Chaplain also worked to [employer] think deeply about his own personal needs, health and body. Terry [name] explored the ways that he can also take care of himself and minister to himself in that way through eating better or thinking more consciously about his health.   Theologically, Terry [name] shared significant scriptures and meaningful times in his life in which he recognized the [facility] being with him. Chaplain provided space for sharing of faith and its impact. Chaplain offered a blessing over Terry [name] as he desires to still accomplish so many things in his life. He wants to regain strength. Chaplain joined in with him in his requests over his life.   Chaplain offered reflective listening, support, theological discourse, and prayer. Terry [name] does have a community of family around him to assist and support him. He desires to regain a faith community and get back into a [employer]. Chaplain recommends that Terry [name] receive therapeutic resources at time of discharge. Chaplain assessed that Terry [name] has held in  a lot of grief that he has experienced throughout his life and would benefit from the ability to release his story and obtain practical tools for the health of his body, mind, and spirit.

## 2023-06-01 NOTE — Plan of Care (Signed)
?  Problem: Education: ?Goal: Knowledge of General Education information will improve ?Description: Including pain rating scale, medication(s)/side effects and non-pharmacologic comfort measures ?Outcome: Progressing ?  ?Problem: Health Behavior/Discharge Planning: ?Goal: Ability to manage health-related needs will improve ?Outcome: Progressing ?  ?Problem: Clinical Measurements: ?Goal: Ability to maintain clinical measurements within normal limits will improve ?Outcome: Progressing ?Goal: Diagnostic test results will improve ?Outcome: Progressing ?Goal: Respiratory complications will improve ?Outcome: Progressing ?Goal: Cardiovascular complication will be avoided ?Outcome: Progressing ?  ?Problem: Activity: ?Goal: Risk for activity intolerance will decrease ?Outcome: Progressing ?  ?Problem: Nutrition: ?Goal: Adequate nutrition will be maintained ?Outcome: Progressing ?  ?Problem: Coping: ?Goal: Level of anxiety will decrease ?Outcome: Progressing ?  ?Problem: Elimination: ?Goal: Will not experience complications related to bowel motility ?Outcome: Progressing ?Goal: Will not experience complications related to urinary retention ?Outcome: Progressing ?  ?Problem: Pain Managment: ?Goal: General experience of comfort will improve ?Outcome: Progressing ?  ?Problem: Safety: ?Goal: Ability to remain free from injury will improve ?Outcome: Progressing ?  ?Problem: Skin Integrity: ?Goal: Risk for impaired skin integrity will decrease ?Outcome: Progressing ?  ?Problem: Clinical Measurements: ?Goal: Will remain free from infection ?Outcome: Not Progressing ?  ?

## 2023-06-01 NOTE — Progress Notes (Signed)
PROGRESS NOTE    Terry [name]  WUJ:[id number] DOB: 1941/10/08 DOA: 05/31/2023 PCP: Donita Brooks, MD   Brief Narrative:  HPI: This is a 82 year old male with past medical history of recurrent urinary tract infections , T2DM, HTN, hypothyroidism, paroxysmal atrial fibrillation, HFpEF,  chronic kidney disease stage III, metastatic prostate cancer, OHS, morbid obesity,  and deconditioning.  Patient has been complaining of losing strength the last 3 to 4 days.  At baseline the patient uses a wheelchair, walker, or a 4-prong seated walker depending on where he is.  In the home he uses a walker.  He found himself having difficulty walking around the home with his walker.  This was first noted 2 weeks ago but has gotten worse over the past 3 to 4 days.  He endorses burning urination for the last 2 weeks and chills.  He denies shortness of breath, cough, fever, nausea, vomiting or confusion.  Today while sitting on his 4-prong seated walker he found himself slipping onto the floor because of his weakness.  His family had him take him to the ER.   In the ER urinalysis is highly suggestive of acute cystitis.  Admission requested  Assessment & Plan:   Principal Problem:   Generalized weakness Active Problems:   CKD (chronic kidney disease) stage 3, GFR 30-59 ml/min (HCC)   Depression with anxiety   Essential hypertension   Morbid obesity due to excess calories (HCC)   CHF, chronic (HCC)   Prostate cancer (HCC)   Complicated UTI (urinary tract infection)   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)   Benign prostatic hyperplasia with lower urinary tract symptoms   Acute cystitis  Acute UTI/cystitis: Urine culture still in process.  Patient improving clinically.  CBC pending this morning.  Continue Rocephin and follow culture.  Generalized weakness: Has chronic debility and deconditioning, typically wheelchair and sometimes walker bound.  PT OT  consulted.  Metastatic prostate cancer: Xgeva injection every 3 months, Zytiga daily, Eligard every 6 months. Continue daily prednisone  Chronic heart failure with preserved ejection fraction: Appears euvolemic.  Continue home medications which include Lasix, Coreg and losartan.  Acquired hypothyroidism: Continue Synthroid.  CKD stage IIIa: Stable.  Essential hypertension: Controlled.  Continue losartan and Coreg.  Paroxysmal atrial fibrillation: Rate controlled.  Continue Coreg and Eliquis.  Morbid obesity: Weight loss and dietary modification counseled.  DVT prophylaxis: enoxaparin (LOVENOX) injection 40 mg Start: 06/01/23 1415   Code Status: Prior  Family Communication: Daughter present at bedside.  Plan of care discussed with patient in length and he/she verbalized understanding and agreed with it.  Status is: Inpatient Remains inpatient appropriate because: Still very weak and needs to be seen by PT OT, waiting for urine culture.   Estimated body mass index is 48.48 kg/m as calculated from the following:   Height as of this encounter: 5\' 7"  (1.702 m).   Weight as of this encounter: 140.4 kg.    Nutritional Assessment: Body mass index is 48.48 kg/m.Marland Kitchen Seen by dietician.  I agree with the assessment and plan as outlined below: Nutrition Status:        . Skin Assessment: I have examined the patient's skin and I agree with the wound assessment as performed by the wound care RN as outlined below:    Consultants:  None  Procedures:  None  Antimicrobials:  Anti-infectives (From admission, onward)    Start     Dose/Rate Route Frequency Ordered Stop   06/01/23 2200  cefTRIAXone (ROCEPHIN) 1 g in sodium chloride 0.9 % 100 mL IVPB        1 g 200 mL/hr over 30 Minutes Intravenous Every 24 hours 06/01/23 0716     05/31/23 2245  cefTRIAXone (ROCEPHIN) 1 g in sodium chloride 0.9 % 100 mL IVPB        1 g 200 mL/hr over 30 Minutes Intravenous  Once 05/31/23 2241 06/01/23  0106         Subjective: Patient seen and examined.  He states that he is feeling better than yesterday.  No new complaint.  Objective: Vitals:   06/01/23 0319 06/01/23 0321 06/01/23 0447 06/01/23 0732  BP: (!) 210/86 (!) 171/94 (!) 160/78 (!) 148/72  Pulse: 65 76 (!) 59 (!) 59  Resp: 20  17 16   Temp: 97.8 F (36.6 C)  98.2 F (36.8 C) 97.8 F (36.6 C)  TempSrc: Oral  Oral Oral  SpO2: 99%  98% 98%  Weight:  (!) 140.4 kg    Height:  5\' 7"  (1.702 m)      Intake/Output Summary (Last 24 hours) at 06/01/2023 1320 Last data filed at 06/01/2023 1206 Gross per 24 hour  Intake 590 ml  Output 325 ml  Net 265 ml   Filed Weights   05/31/23 1629 06/01/23 0321  Weight: (!) 142.9 kg (!) 140.4 kg    Examination:  General exam: Appears calm and comfortable, obese Respiratory system: Clear to auscultation. Respiratory effort normal. Cardiovascular system: S1 & S2 heard, RRR. No JVD, murmurs, rubs, gallops or clicks.  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 Gastrointestinal system: Abdomen is nondistended, soft and nontender. No organomegaly or masses felt. Normal bowel sounds heard. Central nervous system: Alert and oriented. No focal neurological deficits. Extremities: Symmetric 5 x 5 power. Skin: No rashes, lesions or ulcers Psychiatry: Judgement and insight appear normal. Mood & affect appropriate.    Data Reviewed: I have personally reviewed following labs and imaging studies  CBC: Recent Labs  Lab 05/31/23 1713  WBC 8.9  NEUTROABS 7.0  HGB 11.6*  HCT 37.4*  MCV 78.6*  PLT 191   Basic Metabolic Panel: Recent Labs  Lab 05/31/23 2055  NA 142  K 3.7  CL 107  CO2 27  GLUCOSE 97  BUN 23  CREATININE 1.41*  CALCIUM 9.5  MG 2.0   GFR: Estimated Creatinine Clearance: 54.7 mL/min (A) (by C-G formula based on SCr of 1.41 mg/dL (H)). Liver Function Tests: Recent Labs  Lab 05/31/23 2055  AST 14*  ALT 12  ALKPHOS 60  BILITOT 0.6   PROT 7.5  ALBUMIN 3.7   No results for input(s): "LIPASE", "AMYLASE" in the last 168 hours. No results for input(s): "AMMONIA" in the last 168 hours. Coagulation Profile: No results for input(s): "INR", "PROTIME" in the last 168 hours. Cardiac Enzymes: No results for input(s): "CKTOTAL", "CKMB", "CKMBINDEX", "TROPONINI" in the last 168 hours. BNP (last 3 results) No results for input(s): "PROBNP" in the last 8760 hours. HbA1C: No results for input(s): "HGBA1C" in the last 72 hours. CBG: No results for input(s): "GLUCAP" in the last 168 hours. Lipid Profile: No results for input(s): "CHOL", "HDL", "LDLCALC", "TRIG", "CHOLHDL", "LDLDIRECT" in the last 72 hours. Thyroid Function Tests: No results for input(s): "TSH", "T4TOTAL", "FREET4", "T3FREE", "THYROIDAB" in the last 72 hours. Anemia Panel: No results for input(s): "VITAMINB12", "FOLATE", "FERRITIN", "TIBC", "IRON", "RETICCTPCT" in the last 72 hours. Sepsis Labs: No results for input(s): "PROCALCITON", "LATICACIDVEN" in the last 168 hours.  No results found for this or any previous visit (from the past 240 hour(s)).   Radiology Studies: DG Chest Port 1 View  Result Date: 05/31/2023 CLINICAL DATA:  Bilateral lower extremity weakness. Shortness of breath. EXAM: PORTABLE CHEST 1 VIEW COMPARISON:  05/18/2023 FINDINGS: Enlarged cardiac silhouette that could due to pericardial fluid and or cardiomegaly. Lordotic positioning. The lungs are clear. The vascularity is normal. No effusions. IMPRESSION: Enlarged cardiac silhouette that could be due to pericardial fluid and or cardiomegaly. No active disease otherwise. Electronically Signed   By: Paulina Fusi M.D.   On: 05/31/2023 18:15    Scheduled Meds:  allopurinol  50 mg Oral Daily   carvedilol  25 mg Oral BID   enoxaparin (LOVENOX) injection  40 mg Subcutaneous Q24H   levothyroxine  50 mcg Oral Q0600   [START ON 06/02/2023] pneumococcal 20-valent conjugate vaccine  0.5 mL Intramuscular  Tomorrow-1000   predniSONE  5 mg Oral Q breakfast   Continuous Infusions:  cefTRIAXone (ROCEPHIN)  IV       LOS: 0 days   Hughie Closs, MD [facility]  06/01/2023, 1:20 PM   *Please note that this is a verbal dictation therefore any spelling or grammatical errors are due to the "Dragon [employer] One" system interpretation.  Please page via Amion and do not message via secure chat for urgent patient care matters. Secure chat can be used for non urgent patient care matters.  How to contact the [facility] Attending or Consulting provider 7A - 7P or covering provider during after hours 7P -7A, for this patient?  Check the care team in [facility] and look for a) attending/consulting [facility] provider listed and b) the [facility] team listed. Page or secure chat 7A-7P. Log into [email or web address] and use [place]'s universal password to access. If you do not have the password, please contact the hospital operator. Locate the [facility] provider you are looking for under [facility] and page to a number that you can be directly reached. If you still have difficulty reaching the provider, please page the [facility] (Director on Call) for the Hospitalists listed on amion for assistance.

## 2023-06-01 NOTE — H&P (Signed)
PCP:   Donita Brooks, MD   Chief Complaint:  Weakness, burning urination  HPI: This is a 82 year old male with past medical history of recurrent urinary tract infections , T2DM, HTN, hypothyroidism, paroxysmal atrial fibrillation, HFpEF,  chronic kidney disease stage III, metastatic prostate cancer, OHS, morbid obesity,  and deconditioning.  Patient has been complaining of losing strength the last 3 to 4 days.  At baseline the patient uses a wheelchair, walker, or a 4-prong seated walker depending on where he is.  In the home he uses a walker.  He found himself having difficulty walking around the home with his walker.  This was first noted 2 weeks ago but has gotten worse over the past 3 to 4 days.  He endorses burning urination for the last 2 weeks and chills.  He denies shortness of breath, cough, fever, nausea, vomiting or confusion.  Today while sitting on his 4-prong seated walker he found himself slipping onto the floor because of his weakness.  His family had him take him to the ER.  In the ER urinalysis is highly suggestive of acute cystitis.  Admission requested  Review of Systems:  Per HPI  Past Medical History: Past Medical History:  Diagnosis Date   Arthritis    Cancer (HCC)    Phreesia 08/04/2020   CHF, chronic (HCC)    diastolic   CKD (chronic kidney disease) stage 3, GFR 30-59 ml/min (HCC)    Diabetes mellitus without complication (HCC)    Dysrhythmia    Hypertension    Hypothyroidism    Lymphedema    Paroxysmal atrial fibrillation (HCC)    Prostate cancer [facility])    Renal insufficiency    Past Surgical History:  Procedure Laterality Date   COLON SURGERY N/A    Phreesia 08/04/2020    Medications: Prior to Admission medications   Medication Sig Start Date End Date Taking? Authorizing Provider  abiraterone acetate (ZYTIGA) 250 MG tablet Take 4 tablets (1,000 mg total) by mouth daily. Take on an empty stomach 1 hour before or 2 hours after a meal Patient not  taking: Reported on 05/06/2023 03/18/23   Malachy Mood, MD  allopurinol (ZYLOPRIM) 100 MG tablet Take 0.5 tablets (50 mg total) by mouth daily. 07/22/22   Donita Brooks, MD  apixaban (ELIQUIS) 2.5 MG TABS tablet Take 1 tablet (2.5 mg total) by mouth 2 (two) times daily. 07/22/22   Donita Brooks, MD  carvedilol (COREG) 25 MG tablet Take 25 mg by mouth 2 (two) times daily. 02/15/23   [provider]  cephALEXin (KEFLEX) 500 MG capsule Take 1 capsule (500 mg total) by mouth 3 (three) times daily. 05/06/23   Donita Brooks, MD  furosemide (LASIX) 40 MG tablet Take 1 tablet (40 mg total) by mouth daily. 12/24/22   Donita Brooks, MD  gabapentin (NEURONTIN) 300 MG capsule Take 1 capsule (300 mg total) by mouth 3 times/day as needed-between meals & bedtime. 01/10/23   Donita Brooks, MD  Iron, Ferrous Sulfate, 325 (65 Fe) MG TABS Take 325 mg by mouth daily. 07/16/22   Donita Brooks, MD  levothyroxine (SYNTHROID) 50 MCG tablet Take 1 tablet (50 mcg total) by mouth daily. 12/24/22   Donita Brooks, MD  losartan (COZAAR) 50 MG tablet Take 1 tablet (50 mg total) by mouth daily. 10/01/22   Donita Brooks, MD  Multiple Vitamins-Minerals (MULTIVITAMINS THER. W/MINERALS) TABS Take 1 tablet by mouth daily.    [provider]  predniSONE (DELTASONE) 5 MG tablet Take 1 tablet (5 mg total) by mouth daily with breakfast. 01/17/23   Malachy Mood, MD  traMADol (ULTRAM) 50 MG tablet Take 1 tablet (50 mg total) by mouth every 8 (eight) hours as needed. 05/20/23 06/19/23  Donita Brooks, MD    Allergies:   Allergies  Allergen Reactions   Lisinopril     FACIAL SWELLING    Social History:  reports that he has never smoked. He has never been exposed to tobacco smoke. He has never used smokeless tobacco. He reports that he does not currently use alcohol. He reports that he does not currently use drugs after having used the following drugs: Marijuana.  Family History: Family History  Problem  Relation Age of Onset   Hypertension Mother    Cancer Father 38       gastric cancer   Cancer Paternal Aunt        unknown type cancer   Diabetes Son     Physical Exam: Vitals:   06/01/23 0030 06/01/23 0045 06/01/23 0100 06/01/23 0115  BP:    (!) 170/95  Pulse: 72 68 65 80  Resp: (!) 23 (!) 21 (!) 22 18  Temp:      TempSrc:      SpO2: 92% 92% 90% 92%  Weight:      Height:        General:  A&O x3, extreme morbid obesity  Eyes: Pink conjunctiva, no scleral icterus ENT: Moist oral mucosa, neck supple, no thyromegaly Lungs: CTA B/L, no use of accessory muscles Cardiovascular: RRR, no regurgitation, no gallops, no murmurs. No carotid bruits, +1 JVD Abdomen: soft, positive BS, NTND, not an acute abdomen GU: not examined Neuro: CN II - XII grossly intact, sensation intact Musculoskeletal: strength 5/5 B/L UE, 2/5 B/L LE, lymphedema Skin: no rash, no subcutaneous crepitation, no decubitus Psych: appropriate patient   Labs on Admission:  Recent Labs    05/31/23 2055  NA 142  K 3.7  CL 107  CO2 27  GLUCOSE 97  BUN 23  CREATININE 1.41*  CALCIUM 9.5  MG 2.0   Recent Labs    05/31/23 2055  AST 14*  ALT 12  ALKPHOS 60  BILITOT 0.6  PROT 7.5  ALBUMIN 3.7    Recent Labs    05/31/23 1713  WBC 8.9  NEUTROABS 7.0  HGB 11.6*  HCT 37.4*  MCV 78.6*  PLT 191     Radiological Exams on Admission: DG Chest Port 1 View  Result Date: 05/31/2023 CLINICAL DATA:  Bilateral lower extremity weakness. Shortness of breath. EXAM: PORTABLE CHEST 1 VIEW COMPARISON:  05/18/2023 FINDINGS: Enlarged cardiac silhouette that could due to pericardial fluid and or cardiomegaly. Lordotic positioning. The lungs are clear. The vascularity is normal. No effusions. IMPRESSION: Enlarged cardiac silhouette that could be due to pericardial fluid and or cardiomegaly. No active disease otherwise. Electronically Signed   By: Paulina Fusi M.D.   On: 05/31/2023 18:15    Assessment/Plan Present on  Admission:  Acute cystitis -Urine culture collected -IV Rocephin initiated at [facility], this has been continued   Generalized weakness, chronic physical debility and deconditioning -PT consult placed   Prostate cancer widely metastasized -Xgeva injection every 3 months, Zytiga daily, Eligard every 6 months -Continue daily prednisone   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) -Continue Lasix, losartan and Coreg   CKD (chronic kidney disease) stage 3, GFR 30-59 ml/min (HCC) -Continue Lasix.  Avoid nephrotoxic medication  Hypothyroidism -Synthroid resumed   Essential hypertension -Resume losartan, Coreg   Paroxysmal atrial fibrillation -Continue Coreg and Eliquis   Morbid obesity due to excess calories (HCC)    Vermell Madrid 06/01/2023, 3:11 AM

## 2023-06-02 ENCOUNTER — Other Ambulatory Visit (HOSPITAL_COMMUNITY): Payer: Self-pay

## 2023-06-02 DIAGNOSIS — R531 Weakness: Secondary | ICD-10-CM | POA: Diagnosis not present

## 2023-06-02 LAB — BASIC METABOLIC PANEL
Anion gap: 7 (ref 5–15)
BUN: 19 mg/dL (ref 8–23)
CO2: 24 mmol/L (ref 22–32)
Calcium: 8.8 mg/dL — ABNORMAL LOW (ref 8.9–10.3)
Chloride: 107 mmol/L (ref 98–111)
Creatinine, Ser: 1.24 mg/dL (ref 0.61–1.24)
GFR, Estimated: 58 mL/min — ABNORMAL LOW (ref >60)
Glucose, Bld: 124 mg/dL — ABNORMAL HIGH (ref 70–99)
Potassium: 3.2 mmol/L — ABNORMAL LOW (ref 3.5–5.1)
Sodium: 138 mmol/L (ref 135–145)

## 2023-06-02 LAB — CBC WITH DIFFERENTIAL/PLATELET
Abs Immature Granulocytes: 0.04 10*3/uL (ref 0.00–0.07)
Basophils Absolute: 0 10*3/uL (ref 0.0–0.1)
Basophils Relative: 1 %
Eosinophils Absolute: 0.2 10*3/uL (ref 0.0–0.5)
Eosinophils Relative: 3 %
HCT: 39.1 % (ref 39.0–52.0)
Hemoglobin: 11.9 g/dL — ABNORMAL LOW (ref 13.0–17.0)
Immature Granulocytes: 1 %
Lymphocytes Relative: 10 %
Lymphs Abs: 0.9 10*3/uL (ref 0.7–4.0)
MCH: 24.4 pg — ABNORMAL LOW (ref 26.0–34.0)
MCHC: 30.4 g/dL (ref 30.0–36.0)
MCV: 80.3 fL (ref 80.0–100.0)
Monocytes Absolute: 0.8 10*3/uL (ref 0.1–1.0)
Monocytes Relative: 9 %
Neutro Abs: 6.5 10*3/uL (ref 1.7–7.7)
Neutrophils Relative %: 76 %
Platelets: 191 10*3/uL (ref 150–400)
RBC: 4.87 MIL/uL (ref 4.22–5.81)
RDW: 17.7 % — ABNORMAL HIGH (ref 11.5–15.5)
WBC: 8.4 10*3/uL (ref 4.0–10.5)
nRBC: 0 % (ref 0.0–0.2)

## 2023-06-02 MED ORDER — LOSARTAN POTASSIUM 50 MG PO TABS
100.0000 mg | ORAL_TABLET | Freq: Every day | ORAL | Status: DC
  Administered 2023-06-03 – 2023-06-06 (×4): 100 mg via ORAL
  Filled 2023-06-02 (×4): qty 2

## 2023-06-02 MED ORDER — LOSARTAN POTASSIUM 50 MG PO TABS
50.0000 mg | ORAL_TABLET | Freq: Once | ORAL | Status: AC
  Administered 2023-06-02: 50 mg via ORAL
  Filled 2023-06-02: qty 1

## 2023-06-02 MED ORDER — POLYETHYLENE GLYCOL 3350 17 G PO PACK
17.0000 g | PACK | Freq: Every day | ORAL | Status: DC
  Administered 2023-06-02 – 2023-06-06 (×5): 17 g via ORAL
  Filled 2023-06-02 (×5): qty 1

## 2023-06-02 MED ORDER — AMOXICILLIN 500 MG PO CAPS
500.0000 mg | ORAL_CAPSULE | Freq: Three times a day (TID) | ORAL | 0 refills | Status: DC
  Filled 2023-06-02: qty 21, 7d supply, fill #0

## 2023-06-02 MED ORDER — POTASSIUM CHLORIDE CRYS ER 20 MEQ PO TBCR
40.0000 meq | EXTENDED_RELEASE_TABLET | ORAL | Status: AC
  Administered 2023-06-02 (×2): 40 meq via ORAL
  Filled 2023-06-02 (×2): qty 2

## 2023-06-02 MED ORDER — LOSARTAN POTASSIUM 50 MG PO TABS
50.0000 mg | ORAL_TABLET | Freq: Every day | ORAL | Status: DC
  Administered 2023-06-02: 50 mg via ORAL
  Filled 2023-06-02: qty 1

## 2023-06-02 MED ORDER — APIXABAN 2.5 MG PO TABS
2.5000 mg | ORAL_TABLET | Freq: Two times a day (BID) | ORAL | Status: DC
  Administered 2023-06-02 – 2023-06-06 (×9): 2.5 mg via ORAL
  Filled 2023-06-02 (×9): qty 1

## 2023-06-02 MED ORDER — HYDRALAZINE HCL 25 MG PO TABS
25.0000 mg | ORAL_TABLET | Freq: Three times a day (TID) | ORAL | Status: DC
  Administered 2023-06-02 – 2023-06-03 (×2): 25 mg via ORAL
  Filled 2023-06-02 (×3): qty 1

## 2023-06-02 MED ORDER — BISACODYL 10 MG RE SUPP
10.0000 mg | Freq: Every day | RECTAL | Status: DC | PRN
  Administered 2023-06-02 – 2023-06-06 (×2): 10 mg via RECTAL
  Filled 2023-06-02 (×2): qty 1

## 2023-06-02 MED ORDER — HYDRALAZINE HCL 20 MG/ML IJ SOLN
10.0000 mg | Freq: Four times a day (QID) | INTRAMUSCULAR | Status: DC | PRN
  Administered 2023-06-02 – 2023-06-03 (×3): 10 mg via INTRAVENOUS
  Filled 2023-06-02 (×3): qty 1

## 2023-06-02 NOTE — Evaluation (Addendum)
Occupational Therapy Evaluation Patient Details Name: Terry [name] MRN: [id number] DOB: 1941/04/08 Today's Date: 06/02/2023   History of Present Illness Terry [name] is a 82 yr old male admitted to the hospital 05-31-23 with weakness, chills, and burning while urinating. PMH: recurrent UTI, DM II, HTN, a fib, HF, CKD 3, metastatic prostate CA, OHS, arthritis, lymphedema   Clinical Impression   The pt is currently presenting below his baseline level of functioning for self-care management, given the below listed deficits (see OT problem list). As such, she currently requires assist for tasks, including lower body dressing, simulated bathing, and toileting. Upon initiating out of bed activity today, the pt reported feeling "woozy." His blood pressure was subsequently taken and noted to be 228/113 and 223/112 (OT informed pt's nurse and she stated she would also inform MD). The pt also reported having 3 recent falls at home last week. He will benefit from further OT services to maximize his safety and independence with self-care tasks & to decrease the risk for restricted participation in meaningful activities. The pt expressed an interest in short-term SNF rehab once discharged from the hospital, given feelings of increased weakness and difficulty with performing normal activities; OT anticipates he would benefit from such services. If he returns home at discharge instead, he will need home health therapy & increased assistance & supervision.      Recommendations for follow up therapy are one component of a multi-disciplinary discharge planning process, led by the attending physician.  Recommendations may be updated based on patient status, additional functional criteria and insurance authorization.   Assistance Recommended at Discharge Frequent or constant Supervision/Assistance  Patient can return home with the following Assist for transportation;Assistance with cooking/housework;A lot of help with  bathing/dressing/bathroom;A little help with walking and/or transfers    Functional Status Assessment  Patient has had a recent decline in their functional status and demonstrates the ability to make significant improvements in function in a reasonable and predictable amount of time.  Equipment Recommendations  BSC/3in1 (bariatric)    Recommendations for Other Services       Precautions / Restrictions Precautions Precautions: Fall Restrictions Weight Bearing Restrictions: No Other Position/Activity Restrictions: elevated blood pressure during session      Mobility Bed Mobility Overal bed mobility: Needs Assistance Bed Mobility: Supine to Sit     Supine to sit: Min assist, HOB elevated     General bed mobility comments: required increased time and effort, as well as cues to advance BLE off the bed and use the bedrail as needed    Transfers Overall transfer level: Needs assistance Equipment used: Rolling walker (2 wheels) Transfers: Sit to/from Stand Sit to Stand: Min assist, From elevated surface           General transfer comment: Required verbal cues for hand placement and upright posture in standing      Balance Overall balance assessment: History of Falls, Needs assistance     Sitting balance - Comments: static sitting-good. dynamic sitting-fair+       Standing balance comment: min guard to min assist with RW                           ADL either performed or assessed with clinical judgement   ADL   Eating/Feeding: Independent;Sitting   Grooming: Sitting;Set up   Upper Body Bathing: Set up;Sitting Upper Body Bathing Details (indicate cue type and reason): based on clinical judgement Lower Body Bathing: Moderate assistance Lower  Body Bathing Details (indicate cue type and reason): based on clinical judgement Upper Body Dressing : Set up;Sitting Upper Body Dressing Details (indicate cue type and reason): simulated seated EOB Lower Body  Dressing: Maximal assistance Lower Body Dressing Details (indicate cue type and reason): He required increased assist to donn his socks. Toilet Transfer: Minimal assistance;Rolling walker (2 wheels);BSC/3in1 Toilet Transfer Details (indicate cue type and reason): The pt required steadying assist in standing, as well as vebal cues for walker placement and to reach back prior to sitting.   Toileting - Clothing Manipulation Details (indicate cue type and reason): Hygiene was not assessed, as the pt was unable to successfully void.                          Pertinent Vitals/Pain Pain Assessment Pain Assessment: No/denies pain Pain Location: The pt denied having pain, however reported having abdominal discomfort and intermittent "cramping" Pain Intervention(s): Limited activity within patient's tolerance     Hand Dominance Right   Extremity/Trunk Assessment Upper Extremity Assessment Upper Extremity Assessment: Overall WFL for tasks assessed (BUE grip strength 4/5)   Lower Extremity Assessment Lower Extremity Assessment: Generalized weakness       Communication Communication Communication: No difficulties   Cognition Arousal/Alertness: Awake/alert Behavior During Therapy: WFL for tasks assessed/performed Overall Cognitive Status: Within Functional Limits for tasks assessed          General Comments: Oriented x4, friendly, able to follow 1-2 step commands without difficulty                Home Living Family/patient expects to be discharged to:: Private residence Living Arrangements: Children (Son-in-law and son) Available Help at Discharge: Family Type of Home: House Home Access: Level entry     Home Layout: Two level;Bed/bath upstairs;1/2 bath on main level               Home Equipment: Cane - single point;Rollator (4 wheels);Hand held shower head;Tub bench          Prior Functioning/Environment Prior Level of Function : Independent/Modified  Independent             Mobility Comments: Mostly uses a rollator which he wheels around on, however he could ambulate short distance in the home using a rollator. ADLs Comments: Modified independent to independent with ADLs, except for needing occasional assist for sock and shoe management. Daughter has been helping with cooking and cleaning; she is visiting from [place].        OT Problem List: Decreased strength;Decreased activity tolerance;Decreased range of motion;Impaired balance (sitting and/or standing);Decreased knowledge of use of DME or AE      OT Treatment/Interventions: Self-care/ADL training;Therapeutic exercise;Energy conservation;DME and/or AE instruction;Therapeutic activities;Balance training;Patient/family education    OT Goals(Current goals can be found in the care plan section) Acute Rehab OT Goals Patient Stated Goal: To have decreased abdominal discomfort and to have a bowel movement. He also expressed an interest in going to short-term SNF rehab OT Goal Formulation: With patient Time For Goal Achievement: 06/16/23 Potential to Achieve Goals: Good ADL Goals Pt Will Perform Upper Body Dressing: with modified independence;sitting Pt Will Perform Lower Body Dressing: with supervision;sit to/from stand Pt Will Transfer to Toilet: with supervision;ambulating Pt Will Perform Toileting - Clothing Manipulation and hygiene: with supervision;sit to/from stand  OT Frequency: Min 1X/week       AM-PAC OT "6 Clicks" Daily Activity     Outcome Measure Help from another person eating meals?:  None Help from another person taking care of personal grooming?: A Little Help from another person toileting, which includes using toliet, bedpan, or urinal?: A Lot Help from another person bathing (including washing, rinsing, drying)?: A Lot Help from another person to put on and taking off regular upper body clothing?: A Little Help from another person to put on and taking off  regular lower body clothing?: A Lot 6 Click Score: 16   End of Session Equipment Utilized During Treatment: Gait belt;Rolling walker (2 wheels) Nurse Communication: Other (comment) (elevated blood pressures during session)  Activity Tolerance: Other (comment) (Limited by elevated blood pressure and feeling "woozy") Patient left: in bed;with call bell/phone within reach;with bed alarm set  OT Visit Diagnosis: Unsteadiness on feet (R26.81);History of falling (Z91.81);Muscle weakness (generalized) (M62.81)                Time: 1015-1101 OT Time Calculation (min): 46 min Charges:  OT General Charges $OT Visit: 1 Visit OT Evaluation $OT Eval Moderate Complexity: 1 Mod OT Treatments $Therapeutic Activity: 8-22 mins    Reuben Likes, OTR/L 06/02/2023, 12:54 PM

## 2023-06-02 NOTE — Evaluation (Signed)
Physical Therapy Evaluation Patient Details Name: Terry [name] MRN: [id number] DOB: Oct 08, 1941 Today's Date: 06/02/2023  History of Present Illness  Terry [name] is a 82 yr old male admitted to the hospital 05-31-23 with weakness, chills, and burning while urinating. PMH: recurrent UTI, DM II, HTN, a fib, HF, CKD 3, metastatic prostate CA, OHS, arthritis, lymphedema  Clinical Impression  Pt admitted with above diagnosis.  Pt currently with functional limitations due to the deficits listed below (see PT Problem List). Pt will benefit from acute skilled PT to increase their independence and safety with mobility to allow discharge.  Pt reports he is typically ambulatory short distances with his rollator at baseline.  Pt reports his family assists him when they can.  Pt has had 3 falls prior to this admission, all from sitting level (reporting weakness with trying to stand up and sliding off sitting surfaces).  Pt also reports sitting on his rollator and pushing it around his home as well as a computer chair.  Pt would benefit from HHPT upon d/c.          Assistance Recommended at Discharge PRN  If plan is discharge home, recommend the following:  Can travel by private vehicle  A little help with walking and/or transfers;Assistance with cooking/housework;Direct supervision/assist for medications management;Assist for transportation;Help with stairs or ramp for entrance;A little help with bathing/dressing/bathroom        Equipment Recommendations None recommended by PT  Recommendations for Other Services       Functional Status Assessment Patient has had a recent decline in their functional status and demonstrates the ability to make significant improvements in function in a reasonable and predictable amount of time.     Precautions / Restrictions Precautions Precautions: Fall Restrictions Weight Bearing Restrictions: No Other Position/Activity Restrictions: elevated blood pressure during  session      Mobility  Bed Mobility Overal bed mobility: Needs Assistance Bed Mobility: Supine to Sit     Supine to sit: Min guard, HOB elevated     General bed mobility comments: required increased time and effort, provided a hand for pt to self assist    Transfers Overall transfer level: Needs assistance Equipment used: Rolling walker (2 wheels) Transfers: Sit to/from Stand Sit to Stand: Min guard           General transfer comment: verbal cues for hand placement, increased time and effort    Ambulation/Gait Ambulation/Gait assistance: Min guard Gait Distance (Feet): 15 Feet Assistive device: Rolling walker (2 wheels) Gait Pattern/deviations: Step-through pattern, Decreased stride length, Trunk flexed       General Gait Details: preferred to keep forearms on RW instead of holding with hands due to fatigue and weakness, distance limited by generalized weakness and poor endurance  Stairs            Wheelchair Mobility     Tilt Bed    Modified Rankin (Stroke Patients Only)       Balance Overall balance assessment: History of Falls, Needs assistance         Standing balance support: Bilateral upper extremity supported, Reliant on assistive device for balance Standing balance-Leahy Scale: Poor                               Pertinent Vitals/Pain Pain Assessment Pain Assessment: No/denies pain Pain Intervention(s): Repositioned, Monitored during session BP elevated this morning and pt had received BP medication and BP improved.  MD wanting  to d/c pt and requesting therapy evaluations be completed.    Home Living Family/patient expects to be discharged to:: Private residence Living Arrangements: Children (Son-in-law and son) Available Help at Discharge: Family Type of Home: House Home Access: Level entry     Alternate Level Stairs-Number of Steps: full flight but reports he has and uses stair chair lift Home Layout: Two  level;Bed/bath upstairs;1/2 bath on main level Home Equipment: Cane - single point;Rollator (4 wheels);Hand held shower head;Tub bench      Prior Function Prior Level of Function : Independent/Modified Independent             Mobility Comments: Mostly uses a rollator which he wheels around on, however he could ambulate short distance in the home using a rollator. ADLs Comments: Modified independent to independent with ADLs, except for needing occasional assist for sock and shoe management. Daughter has been helping with cooking and cleaning; she is visiting from [place].     Hand Dominance   Dominant Hand: Right    Extremity/Trunk Assessment   Upper Extremity Assessment Upper Extremity Assessment: Overall WFL for tasks assessed (BUE grip strength 4/5)    Lower Extremity Assessment Lower Extremity Assessment: Generalized weakness       Communication   Communication: No difficulties  Cognition Arousal/Alertness: Awake/alert Behavior During Therapy: WFL for tasks assessed/performed Overall Cognitive Status: Within Functional Limits for tasks assessed                                          General Comments      Exercises     Assessment/Plan    PT Assessment Patient needs continued PT services  PT Problem List Decreased strength;Decreased balance;Decreased mobility;Obesity;Decreased activity tolerance;Decreased knowledge of use of DME       PT Treatment Interventions DME instruction;Functional mobility training;Balance training;Patient/family education;Gait training;Therapeutic activities;Therapeutic exercise    PT Goals (Current goals can be found in the Care Plan section)  Acute Rehab PT Goals PT Goal Formulation: With patient Time For Goal Achievement: 06/16/23 Potential to Achieve Goals: Good    Frequency Min 1X/week     Co-evaluation               AM-PAC PT "6 Clicks" Mobility  Outcome Measure Help needed turning from your  back to your side while in a flat bed without using bedrails?: None Help needed moving from lying on your back to sitting on the side of a flat bed without using bedrails?: A Little Help needed moving to and from a bed to a chair (including a wheelchair)?: A Little Help needed standing up from a chair using your arms (e.g., wheelchair or bedside chair)?: A Little Help needed to walk in hospital room?: A Little Help needed climbing 3-5 steps with a railing? : A Lot 6 Click Score: 18    End of Session Equipment Utilized During Treatment: Gait belt Activity Tolerance: Patient tolerated treatment well Patient left: with call bell/phone within reach;in chair;with chair alarm set   PT Visit Diagnosis: Muscle weakness (generalized) (M62.81);Difficulty in walking, not elsewhere classified (R26.2)    Time: 9528-4132 PT Time Calculation (min) (ACUTE ONLY): 17 min   Charges:   PT Evaluation $PT Eval Low Complexity: 1 Low   PT General Charges $$ ACUTE PT VISIT: 1 Visit       Thomasene Mohair PT, DPT Physical Therapist Acute Rehabilitation Services Office: [phone]  Kati L Payson 06/02/2023, 1:06 PM

## 2023-06-02 NOTE — Progress Notes (Signed)
PT Cancellation Note  Patient Details Name: Terry [name] MRN: [id number] DOB: 09/05/1941   Cancelled Treatment:    Reason Eval/Treat Not Completed: Medical issues which prohibited therapy Pt with elevated BP earlier this morning and remains elevated.  Will check back as schedule permits.   [name] 06/02/2023, 10:49 AM Paulino Door, DPT Physical Therapist Acute Rehabilitation Services Office: [phone]

## 2023-06-02 NOTE — Plan of Care (Signed)

## 2023-06-02 NOTE — Progress Notes (Signed)
   06/02/23 0542  Assess: MEWS Score  Temp 98.3 F (36.8 C)  BP (!) 234/92  MAP (mmHg) 134  Pulse Rate 79  Resp 20  SpO2 96 %  O2 Device Room Air  Assess: MEWS Score  MEWS Temp 0  MEWS Systolic 2  MEWS Pulse 0  MEWS RR 0  MEWS LOC 0  MEWS Score 2  MEWS Score Color Yellow  Assess: if the MEWS score is Yellow or Red  Were vital signs taken at a resting state? Yes  Focused Assessment No change from prior assessment  Does the patient meet 2 or more of the SIRS criteria? No  MEWS guidelines implemented  Yes, yellow  Treat  MEWS Interventions Considered administering scheduled or prn medications/treatments as ordered  Take Vital Signs  Increase Vital Sign Frequency  Yellow: Q2hr x1, continue Q4hrs until patient remains green for 12hrs  Escalate  MEWS: Escalate Yellow: Discuss with charge nurse and consider notifying provider and/or RRT  Notify: Charge Nurse/RN  Name of Charge Nurse/RN Notified Publishing copy  Provider Notification  Provider Name/Title A. Virgel Manifold, NP  Date Provider Notified 06/02/23  Time Provider Notified 0543  Method of Notification Page  Notification Reason Other (Comment) (HIGH bp)  Provider response Evaluate remotely  Date of Provider Response 06/02/23  Time of Provider Response 0544  Assess: SIRS CRITERIA  SIRS Temperature  0  SIRS Pulse 0  SIRS Respirations  0  SIRS WBC 0  SIRS Score Sum  0

## 2023-06-02 NOTE — Discharge Summary (Signed)
Physician Discharge Summary  Terry [name] ZOX:[id number] DOB: [birth year] DOA: 05/31/2023  PCP: Donita Brooks, MD  Admit date: 05/31/2023 Discharge date: 06/02/2023 30 Day Unplanned Readmission Risk Score    Flowsheet Row ED to Hosp-Admission (Current) from 05/31/2023 in [facility]  30 Day Unplanned Readmission Risk Score (%) 19.05 Filed at 06/02/2023 1200       This score is the patient's risk of an unplanned readmission within 30 days of being discharged (0 -100%). The score is based on dignosis, age, lab data, medications, orders, and past utilization.   Low:  0-14.9   Medium: 15-21.9   High: 22-29.9   Extreme: 30 and above          Admitted From: Home Disposition: Home  Recommendations for Outpatient Follow-up:  Follow up with PCP in 1-2 weeks Please obtain BMP/CBC in one week Please follow up with your PCP on the following pending results: Unresulted Labs (From admission, onward)    None         Home Health: Yes Equipment/Devices: None  Discharge Condition: Stable CODE STATUS: Full code Diet recommendation: Cardiac  Subjective: Seen and examined, other than constipation, he has no complaints.  Brief/Interim Summary: This is a 82 year old male with past medical history of recurrent urinary tract infections , T2DM, HTN, hypothyroidism, paroxysmal atrial fibrillation, HFpEF,  chronic kidney disease stage III, metastatic prostate cancer, OHS, morbid obesity,  and deconditioning.  Patient has been complaining of losing strength the last 3 to 4 days.  At baseline the patient uses a wheelchair, walker, or a 4-prong seated walker depending on where he is.  In the home he uses a walker.  He found himself having difficulty walking around the home with his walker.  This was first noted 2 weeks ago but has gotten worse over the past 3 to 4 days.  He endorsed burning urination for the last 2 weeks and chills.  No other complaint.  He was  admitted under hospitalist service with UTI since UA was suggestive of UTI in the ED.  He was hemodynamically stable.  Started on Rocephin, culture sent.  Patient has improved over the last 2 to 3 days.  He has been assessed by PT OT and they recommended home health.  Patient's urine culture is growing low yield of Enterococcus faecalis, sensitivities are not back yet.  Since he is a stable, we are going to discharge him on amoxicillin for 7 days.  Metastatic prostate cancer: Xgeva injection every 3 months, Zytiga daily, Eligard every 6 months. Continue daily prednisone   Chronic heart failure with preserved ejection fraction: Appears euvolemic.  Continue home medications which include Lasix, Coreg and losartan.   Acquired hypothyroidism: Continue Synthroid.   CKD stage IIIa: Stable.   Essential hypertension: Controlled.  Continue losartan and Coreg.   Paroxysmal atrial fibrillation: Rate controlled.  Continue Coreg and Eliquis.   Morbid obesity: Weight loss and dietary modification counseled.  Discharge plan was discussed with patient and/or family member and they verbalized understanding and agreed with it.  Discharge Diagnoses:  Principal Problem:   Generalized weakness Active Problems:   CKD (chronic kidney disease) stage 3, GFR 30-59 ml/min (HCC)   Depression with anxiety   Essential hypertension   Morbid obesity due to excess calories (HCC)   CHF, chronic (HCC)   Prostate cancer (HCC)   Complicated UTI (urinary tract infection)   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)   Benign prostatic  hyperplasia with lower urinary tract symptoms   Acute cystitis    Discharge Instructions   Allergies as of 06/02/2023       Reactions   Lisinopril    FACIAL SWELLING        Medication List     STOP taking these medications    cephALEXin 500 MG capsule Commonly known as: KEFLEX       TAKE these medications    abiraterone acetate 250 MG  tablet Commonly known as: ZYTIGA Take 4 tablets (1,000 mg total) by mouth daily. Take on an empty stomach 1 hour before or 2 hours after a meal What changed:  how much to take when to take this   allopurinol 100 MG tablet Commonly known as: ZYLOPRIM Take 0.5 tablets (50 mg total) by mouth daily.   amoxicillin 500 MG capsule Commonly known as: AMOXIL Take 1 capsule (500 mg total) by mouth 3 (three) times daily for 7 days.   apixaban 2.5 MG Tabs tablet Commonly known as: ELIQUIS Take 1 tablet (2.5 mg total) by mouth 2 (two) times daily.   carvedilol 25 MG tablet Commonly known as: COREG Take 25 mg by mouth 2 (two) times daily.   gabapentin 300 MG capsule Commonly known as: NEURONTIN Take 1 capsule (300 mg total) by mouth 3 times/day as needed-between meals & bedtime. What changed: when to take this   Iron (Ferrous Sulfate) 325 (65 Fe) MG Tabs Take 325 mg by mouth daily.   levothyroxine 50 MCG tablet Commonly known as: SYNTHROID Take 1 tablet (50 mcg total) by mouth daily.   losartan 50 MG tablet Commonly known as: COZAAR Take 1 tablet (50 mg total) by mouth daily.   predniSONE 5 MG tablet Commonly known as: DELTASONE Take 1 tablet (5 mg total) by mouth daily with breakfast.   traMADol 50 MG tablet Commonly known as: ULTRAM Take 1 tablet (50 mg total) by mouth every 8 (eight) hours as needed.   VITAMIN D PO Take 1 tablet by mouth daily.   VITAMIN E PO Take 1 tablet by mouth daily.        Follow-up Information     Donita Brooks, MD Follow up in 1 week(s).   Specialty: Family Medicine Contact information: [address] [place] [address] [phone]                Allergies  Allergen Reactions   Lisinopril     FACIAL SWELLING    Consultations: None   Procedures/Studies: DG Chest Port 1 View  Result Date: 05/31/2023 CLINICAL DATA:  Bilateral lower extremity weakness. Shortness of breath. EXAM: PORTABLE CHEST 1 VIEW  COMPARISON:  05/18/2023 FINDINGS: Enlarged cardiac silhouette that could due to pericardial fluid and or cardiomegaly. Lordotic positioning. The lungs are clear. The vascularity is normal. No effusions. IMPRESSION: Enlarged cardiac silhouette that could be due to pericardial fluid and or cardiomegaly. No active disease otherwise. Electronically Signed   By: Paulina Fusi M.D.   On: 05/31/2023 18:15   DG Chest Port 1 View  Result Date: 05/18/2023 CLINICAL DATA:  Shortness of breath.  History of prostate cancer. EXAM: PORTABLE CHEST 1 VIEW COMPARISON:  X-ray 10/24/2020.  PET-CT 01/13/2023. FINDINGS: No consolidation, pneumothorax or effusion. No edema. Mild bronchial wall thickening. Slight left basilar atelectasis. Films are under penetrated. Overlapping cardiac leads. Tortuous aorta. Degenerative changes of the spine. Advanced degenerative changes of the left shoulder at the edge of the imaging field. IMPRESSION: Mild left basilar  atelectasis. Mild bronchial wall thickening. No consolidation. Under penetrated radiograph Electronically Signed   By: Karen Kays M.D.   On: 05/18/2023 15:00     Discharge Exam: Vitals:   06/02/23 1118 06/02/23 1146  BP: (!) 196/90 (!) 168/82  Pulse:  81  Resp:  18  Temp:  97.7 F (36.5 C)  SpO2:  98%   Vitals:   06/02/23 1115 06/02/23 1116 06/02/23 1118 06/02/23 1146  BP: (!) 200/92 (!) 203/88 (!) 196/90 (!) 168/82  Pulse:    81  Resp:    18  Temp:    97.7 F (36.5 C)  TempSrc:    Oral  SpO2:    98%  Weight:      Height:        General: Pt is alert, awake, not in acute distress Cardiovascular: RRR, S1/S2 +, no rubs, no gallops Respiratory: CTA bilaterally, no wheezing, no rhonchi Abdominal: Soft, NT, ND, bowel sounds + Extremities: no edema, no cyanosis    The results of significant diagnostics from this hospitalization (including imaging, microbiology, ancillary and laboratory) are listed below for reference.     Microbiology: Recent Results  (from the past 240 hour(s))  Urine Culture (for pregnant, neutropenic or urologic patients or patients with an indwelling urinary catheter)     Status: Abnormal (Preliminary result)   Collection Time: 05/31/23  6:12 PM   Specimen: Urine, Clean Catch  Result Value Ref Range Status   Specimen Description   Final    URINE, CLEAN CATCH Performed at [facility], [address] [place], [place] [address]    Special Requests   Final    NONE Performed at [facility], [address] [place], [place] [address]    Culture (A)  Final    30,000 COLONIES/mL ENTEROCOCCUS FAECALIS SUSCEPTIBILITIES TO FOLLOW Performed at [facility], [address], [place], [place] [address]    Report Status PENDING  Incomplete     Labs: BNP (last 3 results) Recent Labs    05/18/23 1442 05/31/23 1730  BNP 92.1 66.8   Basic Metabolic Panel: Recent Labs  Lab 05/31/23 2055 06/01/23 1358 06/02/23 0821  NA 142  --  138  K 3.7  --  3.2*  CL 107  --  107  CO2 27  --  24  GLUCOSE 97  --  124*  BUN 23  --  19  CREATININE 1.41* 1.30* 1.24  CALCIUM 9.5  --  8.8*  MG 2.0  --   --    Liver Function Tests: Recent Labs  Lab 05/31/23 2055  AST 14*  ALT 12  ALKPHOS 60  BILITOT 0.6  PROT 7.5  ALBUMIN 3.7   No results for input(s): "LIPASE", "AMYLASE" in the last 168 hours. No results for input(s): "AMMONIA" in the last 168 hours. CBC: Recent Labs  Lab 05/31/23 1713 06/01/23 1358 06/02/23 0821  WBC 8.9 6.4 8.4  NEUTROABS 7.0  --  6.5  HGB 11.6* 12.0* 11.9*  HCT 37.4* 40.5 39.1  MCV 78.6* 81.5 80.3  PLT 191 186 191   Cardiac Enzymes: No results for input(s): "CKTOTAL", "CKMB", "CKMBINDEX", "TROPONINI" in the last 168 hours. BNP: Invalid input(s): "POCBNP" CBG: No results for input(s): "GLUCAP" in the last 168 hours. D-Dimer No results for input(s): "DDIMER" in the last 72 hours. Hgb A1c No results for input(s): "HGBA1C" in the last 72  hours. Lipid Profile No results for input(s): "CHOL", "HDL", "LDLCALC", "TRIG", "CHOLHDL", "LDLDIRECT" in the  last 72 hours. Thyroid function studies No results for input(s): "TSH", "T4TOTAL", "T3FREE", "THYROIDAB" in the last 72 hours.  Invalid input(s): "FREET3" Anemia work up No results for input(s): "VITAMINB12", "FOLATE", "FERRITIN", "TIBC", "IRON", "RETICCTPCT" in the last 72 hours. Urinalysis    Component Value Date/Time   COLORURINE YELLOW 05/31/2023 1812   APPEARANCEUR HAZY (A) 05/31/2023 1812   APPEARANCEUR Cloudy (A) 08/21/2020 1530   LABSPEC 1.013 05/31/2023 1812   PHURINE 6.5 05/31/2023 1812   GLUCOSEU NEGATIVE 05/31/2023 1812   HGBUR LARGE (A) 05/31/2023 1812   BILIRUBINUR NEGATIVE 05/31/2023 1812   BILIRUBINUR Negative 08/21/2020 1530   KETONESUR NEGATIVE 05/31/2023 1812   PROTEINUR TRACE (A) 05/31/2023 1812   NITRITE NEGATIVE 05/31/2023 1812   LEUKOCYTESUR LARGE (A) 05/31/2023 1812   Sepsis Labs Recent Labs  Lab 05/31/23 1713 06/01/23 1358 06/02/23 0821  WBC 8.9 6.4 8.4   Microbiology Recent Results (from the past 240 hour(s))  Urine Culture (for pregnant, neutropenic or urologic patients or patients with an indwelling urinary catheter)     Status: Abnormal (Preliminary result)   Collection Time: 05/31/23  6:12 PM   Specimen: Urine, Clean Catch  Result Value Ref Range Status   Specimen Description   Final    URINE, CLEAN CATCH Performed at [facility], [address], [place], [place] [address]    Special Requests   Final    NONE Performed at [facility], [address] [place], [place] [address]    Culture (A)  Final    30,000 COLONIES/mL ENTEROCOCCUS FAECALIS SUSCEPTIBILITIES TO FOLLOW Performed at [facility], [address], [place], [place] [address]    Report Status PENDING  Incomplete    FURTHER DISCHARGE INSTRUCTIONS:   Get Medicines reviewed and adjusted: Please take all your  medications with you for your next visit with your Primary MD   Laboratory/radiological data: Please request your Primary MD to go over all hospital tests and procedure/radiological results at the follow up, please ask your Primary MD to get all Hospital records sent to his/her office.   In some cases, they will be blood work, cultures and biopsy results pending at the time of your discharge. Please request that your primary care M.D. goes through all the records of your hospital data and follows up on these results.   Also Note the following: If you experience worsening of your admission symptoms, develop shortness of breath, life threatening emergency, suicidal or homicidal thoughts you must seek medical attention immediately by calling 911 or calling your MD immediately  if symptoms less severe.   You must read complete instructions/literature along with all the possible adverse reactions/side effects for all the Medicines you take and that have been prescribed to you. Take any new Medicines after you have completely understood and accpet all the possible adverse reactions/side effects.    Do not drive when taking Pain medications or sleeping medications (Benzodaizepines)   Do not take more than prescribed Pain, Sleep and Anxiety Medications. It is not advisable to combine anxiety,sleep and pain medications without talking with your primary care practitioner   Special Instructions: If you have smoked or chewed Tobacco  in the last 2 yrs please stop smoking, stop any regular Alcohol  and or any Recreational drug use.   Wear Seat belts while driving.   Please note: You were cared for by a hospitalist during your hospital stay. Once you are discharged, your primary care physician will handle any further medical issues. Please  note that NO REFILLS for any discharge medications will be authorized once you are discharged, as it is imperative that you return to your primary care physician (or  establish a relationship with a primary care physician if you do not have one) for your post hospital discharge needs so that they can reassess your need for medications and monitor your lab values  Time coordinating discharge: Over 30 minutes  SIGNED:   Hughie Closs, MD  [facility] 06/02/2023, 12:24 PM *Please note that this is a verbal dictation therefore any spelling or grammatical errors are due to the "[employer] One" system interpretation. If 7PM-7AM, please contact night-coverage [email or web address]

## 2023-06-02 NOTE — TOC Initial Note (Signed)
Transition of Care [facility]) - Initial/Assessment Note    Patient Details  Name: Terry [name] MRN: [id number] Date of Birth: 03-26-1941  Transition of Care [facility]) CM/SW Contact:    Otelia Santee, LCSW Phone Number: 06/02/2023, 3:54 PM  Clinical Narrative:                 Met with pt to discuss discharge plan to reutrn home with home health. Pt shares he still feels very weak and is still concerned about his blood pressure. Pt is agreeable to home health as he is being discharged today. HHPT/OT/RN arranged with [facility]. Pt shares he has a rollator at home he uses for mobility. Pt would like barriatric bedside commode to be delivered to his house. Contacted [facility] to order [facility]. Will need to confirm order with [employer] prior to pt discharging.   Update 3:15: CSW met with pt and daughter in room. Pt's daughter has had hip surgeries and uses a 4 point cane for ambulation. Pt's daughter shares she will not be able to get him in and out of the car with his current debility and with her being unable to physically support. Pt and daughter feel that pt need rehab. CSW discussed current recommendation for home health and discussed utilizing EMS transport. Pt's daughter also shared concerns with pt's BP still being elevated and him not having had a BM for 2-3 days. MD notified.    Expected Discharge Plan: Home w Home Health Services Barriers to Discharge: Other (must enter comment)   Patient Goals and CMS Choice Patient states their goals for this hospitalization and ongoing recovery are:: To go to rehab CMS Medicare.gov Compare Post Acute Care list provided to:: Patient Choice offered to / list presented to : Patient, Adult Children [place] ownership interest in [facility].provided to:: Patient    Expected Discharge Plan and Services In-house Referral: Clinical Social Work Discharge Planning Services: NA Post Acute Care Choice: Skilled Nursing Facility Living arrangements for the  past 2 months: Single Family Home Expected Discharge Date: 06/02/23               DME Arranged: Bedside commode DME Agency: [employer] Date DME Agency Contacted: 06/02/23 Time DME Agency Contacted: [phone] Representative spoke with at DME Agency: Zack HH Arranged: RN, PT, OT HH Agency: [facility] Date [facility] Agency Contacted: 06/02/23 Time HH Agency Contacted: [phone] Representative spoke with at [facility] Agency: Cindie  Prior Living Arrangements/Services Living arrangements for the past 2 months: Single Family Home Lives with:: Adult Children Patient language and need for interpreter reviewed:: Yes Do you feel safe going back to the place where you live?: Yes      Need for Family Participation in Patient Care: No (Comment) Care giver support system in place?: No (comment) Current home services: DME (Rollator) Criminal Activity/Legal Involvement Pertinent to Current Situation/Hospitalization: No - Comment as needed  Activities of Daily Living Home Assistive Devices/Equipment: Environmental consultant (specify type), Wheelchair, Shower chair with back, Eyeglasses, Raised toilet seat with rails ADL Screening (condition at time of admission) Patient's cognitive ability adequate to safely complete daily activities?: Yes Is the patient deaf or have difficulty hearing?: No Does the patient have difficulty seeing, even when wearing glasses/contacts?: No Does the patient have difficulty concentrating, remembering, or making decisions?: No Patient able to express need for assistance with ADLs?: Yes Does the patient have difficulty dressing or bathing?: Yes Independently performs ADLs?: Yes (appropriate for developmental age) Does the patient have difficulty walking  or climbing stairs?: Yes Weakness of Legs: Both Weakness of Arms/Hands: None  Permission Sought/Granted Permission sought to share information with : Family Supports Permission granted to share information with : Yes, Verbal Permission  Granted  Share Information with NAME: Jameson Tormey     Permission granted to share info w Relationship: Daughter  Permission granted to share info w Contact Information: [phone]  Emotional Assessment Appearance:: Appears stated age Attitude/Demeanor/Rapport: Engaged Affect (typically observed): Accepting, Pleasant Orientation: : Oriented to Self, Oriented to Place, Oriented to  Time, Oriented to Situation Alcohol / Substance Use: Not Applicable Psych Involvement: No (comment)  Admission diagnosis:  Inability to walk [R26.2] Generalized weakness [R53.1] Urinary tract infection with hematuria, site unspecified [N39.0, R31.9] Patient Active Problem List   Diagnosis Date Noted   Acute cystitis 06/01/2023   Generalized weakness 05/31/2023   Urinary incontinence 05/09/2023   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) 02/24/2023   Asymptomatic microscopic hematuria 01/22/2022   Benign prostatic hyperplasia with lower urinary tract symptoms 01/22/2022   Nocturia 01/22/2022   Retained ureteral stent 01/22/2022   Goals of care, counseling/discussion 09/04/2021   Sepsis (HCC) 10/24/2020   Complicated UTI (urinary tract infection) 10/24/2020   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) 10/24/2020   CHF, chronic (HCC)    Prostate cancer (HCC)    Congestive heart failure (HCC) 07/02/2020   Macrocytic anemia 06/30/2020   Elevated PSA 06/30/2020   Cellulitis of lower extremity 06/30/2020   Prediabetes 01/04/2019   Paroxysmal atrial fibrillation (HCC) 01/01/2019   Risk for falls 12/27/2017   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HCC) 10/18/2017   Right ureteral stone 10/01/2017   Iron deficiency anemia due to chronic blood loss 06/22/2016   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   Depression with anxiety 06/19/2015   CKD (chronic kidney disease) stage 3, GFR 30-59 ml/min (HCC) 05/26/2015   Essential  hypertension 11/01/2014   Gouty arthritis of toe 11/01/2014   Morbid obesity due to excess calories (HCC) 11/01/2014   Primary osteoarthritis of both knees 11/01/2014   PCP:  Donita Brooks, MD Pharmacy:   [id number] - [address]), [place] - [address] [place] ([place]) [place] [address] Phone: [phone] Fax: [phone]   - [facility] [address] [place] [address] Phone: [phone] Fax: [phone]     Social Determinants of Health (SDOH) Social History: SDOH Screenings   Food Insecurity: Food Insecurity Present (06/01/2023)  Housing: Medium Risk (06/01/2023)  Transportation Needs: No Transportation Needs (06/01/2023)  Utilities: Not At Risk (06/01/2023)  Alcohol Screen: Low Risk  (06/09/2022)  Depression (PHQ2-9): Low Risk  (01/14/2023)  Financial Resource Strain: Low Risk  (12/17/2022)  Physical Activity: Insufficiently Active (12/17/2022)  Social Connections: Socially Isolated (12/17/2022)  Stress: No Stress Concern Present (12/17/2022)  Tobacco Use: Low Risk  (05/31/2023)   SDOH Interventions:     Readmission Risk Interventions    06/02/2023    3:52 PM  Readmission Risk Prevention Plan  Transportation Screening Complete  PCP or Specialist Appt within 5-7 Days Complete  Home Care Screening Complete  Medication Review (RN CM) Complete

## 2023-06-02 NOTE — Progress Notes (Signed)
I came back to see this patient and discuss with the family as there were concerns raised by the family that patient is significantly deconditioned and not at baseline and they believe that patient would be better served if he would be discharged to SNF. I was able to speak to his daughter face-to-face, there was another family member at the bedside and then they called patient's other daughter Misty Stanley who happens to be case manager/worked with the healthcare team.  She raised similar concern.  We had lengthy discussion where I reiterated that the decision of discharge earlier was based on the patient's medical stability, his blood pressure being stable and UTI being treated and he was being discharged on oral antibiotics to complete the course.  I also informed them that patient was seen by PT OT and home health PT OT was recommended by them as well.  I also further explained to them that in situations like these, unfortunately insurance companies do not approve SNF unless PT/OT recommends as such.  Misty Stanley and her other sister and other family member, were all very understanding of my boundaries and restrictions regarding physical strength decision which is totally on PT. while I was still in the room, we checked his blood pressure and systolic was over 200 and diastolic was over 100, this is a change compared to earlier.  Based on this, we decided to cancel his discharge, adjust his medications.  I am going to increase his losartan to 100 mg.  He is already maxed out with his Coreg 25 mg p.o. twice daily.  I am going to add hydralazine 25 mg p.o. 3 times daily and we will continue as needed hydralazine and will give him a dose now.  Family requested another assessment by PT.  It is already past 5 PM so they are aware that patient cannot be assessed today however we will make sure that patient is assessed by PT tomorrow morning.  I clearly laid out expectations that if PT continues to recommend home health PT by  tomorrow and if patient's blood pressure is under control, we will have no other medical necessity to continue hospitalization.  All the family members verbalized understanding.  Patient's primary nurse Vanetta Shawl witnessed whole conversation.

## 2023-06-03 ENCOUNTER — Inpatient Hospital Stay: Payer: Medicare Other | Admitting: Family Medicine

## 2023-06-03 DIAGNOSIS — R531 Weakness: Secondary | ICD-10-CM | POA: Diagnosis not present

## 2023-06-03 LAB — BASIC METABOLIC PANEL
Anion gap: 8 (ref 5–15)
BUN: 19 mg/dL (ref 8–23)
CO2: 23 mmol/L (ref 22–32)
Calcium: 8.9 mg/dL (ref 8.9–10.3)
Chloride: 108 mmol/L (ref 98–111)
Creatinine, Ser: 1.3 mg/dL — ABNORMAL HIGH (ref 0.61–1.24)
GFR, Estimated: 55 mL/min — ABNORMAL LOW (ref >60)
Glucose, Bld: 109 mg/dL — ABNORMAL HIGH (ref 70–99)
Potassium: 3.3 mmol/L — ABNORMAL LOW (ref 3.5–5.1)
Sodium: 139 mmol/L (ref 135–145)

## 2023-06-03 LAB — URINE CULTURE: Culture: 30000 — AB

## 2023-06-03 MED ORDER — POTASSIUM CHLORIDE CRYS ER 20 MEQ PO TBCR
40.0000 meq | EXTENDED_RELEASE_TABLET | ORAL | Status: AC
  Administered 2023-06-03 (×2): 40 meq via ORAL
  Filled 2023-06-03 (×2): qty 2

## 2023-06-03 MED ORDER — AMOXICILLIN 500 MG PO CAPS
500.0000 mg | ORAL_CAPSULE | Freq: Three times a day (TID) | ORAL | Status: DC
  Administered 2023-06-03 – 2023-06-06 (×11): 500 mg via ORAL
  Filled 2023-06-03 (×13): qty 1

## 2023-06-03 MED ORDER — HYDRALAZINE HCL 50 MG PO TABS
50.0000 mg | ORAL_TABLET | Freq: Three times a day (TID) | ORAL | Status: DC
  Administered 2023-06-03 – 2023-06-06 (×8): 50 mg via ORAL
  Filled 2023-06-03 (×7): qty 1

## 2023-06-03 NOTE — Progress Notes (Addendum)
PROGRESS NOTE    Terry [name]  ZOX:[id number] DOB: 02-Dec-1940 DOA: 05/31/2023 PCP: Donita Brooks, MD   Brief Narrative:  HPI: This is a 82 year old male with past medical history of recurrent urinary tract infections , T2DM, HTN, hypothyroidism, paroxysmal atrial fibrillation, HFpEF,  chronic kidney disease stage III, metastatic prostate cancer, OHS, morbid obesity,  and deconditioning.  Patient has been complaining of losing strength the last 3 to 4 days.  At baseline the patient uses a wheelchair, walker, or a 4-prong seated walker depending on where he is.  In the home he uses a walker.  He found himself having difficulty walking around the home with his walker.  This was first noted 2 weeks ago but has gotten worse over the past 3 to 4 days.  He endorses burning urination for the last 2 weeks and chills.  He denies shortness of breath, cough, fever, nausea, vomiting or confusion.  Today while sitting on his 4-prong seated walker he found himself slipping onto the floor because of his weakness.  His family had him take him to the ER.   In the ER urinalysis is highly suggestive of acute cystitis.  Admission requested  Assessment & Plan:   Principal Problem:   Generalized weakness Active Problems:   CKD (chronic kidney disease) stage 3, GFR 30-59 ml/min (HCC)   Depression with anxiety   Essential hypertension   Morbid obesity due to excess calories (HCC)   CHF, chronic (HCC)   Prostate cancer (HCC)   Complicated UTI (urinary tract infection)   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)   Benign prostatic hyperplasia with lower urinary tract symptoms   Acute cystitis  Acute UTI/cystitis: Urine culture Enterococcus.  Switching antibiotics to amoxicillin.  Generalized weakness: Has chronic debility and deconditioning, typically wheelchair and sometimes walker bound.  Seen by PT OT and initially they recommended home with home health.  Family unhappy and  resistant for discharge on 06/02/2023.  Please see my note separately on that day.  Per family request, discharge was canceled and per their request, patient was reassessed by PT 06/03/2023 and now they recommend SNF.  Metastatic prostate cancer: Xgeva injection every 3 months, Zytiga daily, Eligard every 6 months. Continue daily prednisone  Chronic heart failure with preserved ejection fraction: Appears euvolemic.  Continue home medications which include Lasix, Coreg and losartan.  Acquired hypothyroidism: Continue Synthroid.  CKD stage IIIa: Stable.  Hypokalemia: will replace.   Essential hypertension: Uncontrolled and elevated.  Patient already on Coreg maximized.  25 mg p.o. twice daily.  Increased losartan to 100 mg 06/02/2023 and added hydralazine 25 mg p.o. 3 times daily.  Blood pressure improved but still elevated.  Will increase hydralazine further to 50 mg p.o. 3 times daily.  Paroxysmal atrial fibrillation: Rate controlled.  Continue Coreg and Eliquis.  Morbid obesity: Weight loss and dietary modification counseled.  DVT prophylaxis: apixaban (ELIQUIS) tablet 2.5 mg Start: 06/02/23 1000   Code Status: Prior  Family Communication: Daughter present at bedside.  Plan of care discussed with patient in length and he/she verbalized understanding and agreed with it.  Status is: Inpatient Remains inpatient appropriate because: Medically stable, needs to be placed to SNF.  Estimated body mass index is 47.44 kg/m as calculated from the following:   Height as of this encounter: 5\' 7"  (1.702 m).   Weight as of this encounter: 137.4 kg.    Nutritional Assessment: Body mass index is 47.44 kg/m.Marland Kitchen Seen by dietician.  I agree  with the assessment and plan as outlined below: Nutrition Status:        . Skin Assessment: I have examined the patient's skin and I agree with the wound assessment as performed by the wound care RN as outlined below:    Consultants:  None  Procedures:   None  Antimicrobials:  Anti-infectives (From admission, onward)    Start     Dose/Rate Route Frequency Ordered Stop   06/03/23 1200  amoxicillin (AMOXIL) capsule 500 mg        500 mg Oral 3 times daily 06/03/23 1111     06/02/23 0000  amoxicillin (AMOXIL) 500 MG capsule        500 mg Oral 3 times daily 06/02/23 1223 06/09/23 2359   06/01/23 2200  cefTRIAXone (ROCEPHIN) 1 g in sodium chloride 0.9 % 100 mL IVPB  Status:  Discontinued        1 g 200 mL/hr over 30 Minutes Intravenous Every 24 hours 06/01/23 0716 06/03/23 1111   05/31/23 2245  cefTRIAXone (ROCEPHIN) 1 g in sodium chloride 0.9 % 100 mL IVPB        1 g 200 mL/hr over 30 Minutes Intravenous  Once 05/31/23 2241 06/01/23 0106         Subjective: Patient seen and examined.  Daughter at the bedside.  Patient feels well.  No complaints.  Had 2 bowel movements yesterday so constipation is resolving as well.  Objective: Vitals:   06/03/23 0524 06/03/23 0538 06/03/23 0605 06/03/23 1235  BP: (!) 165/90 (!) 165/90  (!) 187/85  Pulse: 86   67  Resp: 20   16  Temp: 98.2 F (36.8 C) 97.9 F (36.6 C)  98.2 F (36.8 C)  TempSrc: Oral Oral  Oral  SpO2: 95%   99%  Weight:   (!) 137.4 kg   Height:        Intake/Output Summary (Last 24 hours) at 06/03/2023 1405 Last data filed at 06/03/2023 0911 Gross per 24 hour  Intake --  Output 150 ml  Net -150 ml   Filed Weights   05/31/23 1629 06/01/23 0321 06/03/23 0605  Weight: (!) 142.9 kg (!) 140.4 kg (!) 137.4 kg    Examination:  General exam: Appears calm and comfortable , morbidly obese Respiratory system: Clear to auscultation. Respiratory effort normal. Cardiovascular system: S1 & S2 heard, RRR. No JVD, murmurs, rubs, gallops or clicks.  Chronic lymphedema Gastrointestinal system: Abdomen is nondistended, soft and nontender. No organomegaly or masses felt. Normal bowel sounds heard. Central nervous system: Alert and oriented. No focal neurological deficits. Extremities:  Symmetric 5 x 5 power. Skin: No rashes, lesions or ulcers.  Psychiatry: Judgement and insight appear normal. Mood & affect appropriate.    Data Reviewed: I have personally reviewed following labs and imaging studies  CBC: Recent Labs  Lab 05/31/23 1713 06/01/23 1358 06/02/23 0821  WBC 8.9 6.4 8.4  NEUTROABS 7.0  --  6.5  HGB 11.6* 12.0* 11.9*  HCT 37.4* 40.5 39.1  MCV 78.6* 81.5 80.3  PLT 191 186 191   Basic Metabolic Panel: Recent Labs  Lab 05/31/23 2055 06/01/23 1358 06/02/23 0821 06/03/23 0844  NA 142  --  138 139  K 3.7  --  3.2* 3.3*  CL 107  --  107 108  CO2 27  --  24 23  GLUCOSE 97  --  124* 109*  BUN 23  --  19 19  CREATININE 1.41* 1.30* 1.24 1.30*  CALCIUM 9.5  --  8.8* 8.9  MG 2.0  --   --   --    GFR: Estimated Creatinine Clearance: 58.6 mL/min (A) (by C-G formula based on SCr of 1.3 mg/dL (H)). Liver Function Tests: Recent Labs  Lab 05/31/23 2055  AST 14*  ALT 12  ALKPHOS 60  BILITOT 0.6  PROT 7.5  ALBUMIN 3.7   No results for input(s): "LIPASE", "AMYLASE" in the last 168 hours. No results for input(s): "AMMONIA" in the last 168 hours. Coagulation Profile: No results for input(s): "INR", "PROTIME" in the last 168 hours. Cardiac Enzymes: No results for input(s): "CKTOTAL", "CKMB", "CKMBINDEX", "TROPONINI" in the last 168 hours. BNP (last 3 results) No results for input(s): "PROBNP" in the last 8760 hours. HbA1C: No results for input(s): "HGBA1C" in the last 72 hours. CBG: No results for input(s): "GLUCAP" in the last 168 hours. Lipid Profile: No results for input(s): "CHOL", "HDL", "LDLCALC", "TRIG", "CHOLHDL", "LDLDIRECT" in the last 72 hours. Thyroid Function Tests: No results for input(s): "TSH", "T4TOTAL", "FREET4", "T3FREE", "THYROIDAB" in the last 72 hours. Anemia Panel: No results for input(s): "VITAMINB12", "FOLATE", "FERRITIN", "TIBC", "IRON", "RETICCTPCT" in the last 72 hours. Sepsis Labs: No results for input(s):  "PROCALCITON", "LATICACIDVEN" in the last 168 hours.  Recent Results (from the past 240 hour(s))  Urine Culture (for pregnant, neutropenic or urologic patients or patients with an indwelling urinary catheter)     Status: Abnormal   Collection Time: 05/31/23  6:12 PM   Specimen: Urine, Clean Catch  Result Value Ref Range Status   Specimen Description   Final    URINE, CLEAN CATCH Performed at [facility], [address] [place], [place] [address]    Special Requests   Final    NONE Performed at [facility], [address] [place], [place] [address]    Culture 30,000 COLONIES/mL ENTEROCOCCUS FAECALIS (A)  Final   Report Status 06/03/2023 FINAL  Final   Organism ID, Bacteria ENTEROCOCCUS FAECALIS (A)  Final      Susceptibility   Enterococcus faecalis - MIC*    AMPICILLIN <=2 SENSITIVE Sensitive     NITROFURANTOIN <=16 SENSITIVE Sensitive     VANCOMYCIN 1 SENSITIVE Sensitive     * 30,000 COLONIES/mL ENTEROCOCCUS FAECALIS     Radiology Studies: No results found.  Scheduled Meds:  allopurinol  50 mg Oral Daily   amoxicillin  500 mg Oral TID   apixaban  2.5 mg Oral BID   carvedilol  25 mg Oral BID   hydrALAZINE  50 mg Oral Q8H   levothyroxine  50 mcg Oral Q0600   losartan  100 mg Oral Daily   polyethylene glycol  17 g Oral Daily   predniSONE  5 mg Oral Q breakfast   Continuous Infusions:     LOS: 2 days   Hughie Closs, MD [facility]  06/03/2023, 2:05 PM   *Please note that this is a verbal dictation therefore any spelling or grammatical errors are due to the "Dragon Medical One" system interpretation.  Please page via Amion and do not message via secure chat for urgent patient care matters. Secure chat can be used for non urgent patient care matters.  How to contact the [facility] Attending or Consulting provider 7A - 7P or covering provider during after hours 7P -7A, for this patient?  Check the care team in [facility] and look  for a) attending/consulting TRH provider listed and b) the [facility] team listed. Page or secure chat 7A-7P. Log into [email or web address] and use  [place]'s universal password to access. If you do not have the password, please contact the hospital operator. Locate the [facility] provider you are looking for under [facility] and page to a number that you can be directly reached. If you still have difficulty reaching the provider, please page the [facility] (Director on Call) for the Hospitalists listed on amion for assistance.

## 2023-06-03 NOTE — Progress Notes (Signed)
Physical Therapy Treatment Patient Details Name: Terry [name] MRN: [id number] DOB: Oct 25, 1941 Today's Date: 06/03/2023   History of Present Illness Terry [name] is a 82 yr old male admitted to the hospital 05-31-23 with weakness, chills, and burning while urinating. PMH: recurrent UTI, DM II, HTN, a fib, HF, CKD 3, metastatic prostate CA, OHS, arthritis, lymphedema    PT Comments   Pt admitted with above diagnosis.  Pt currently with functional limitations due to the deficits listed below (see PT Problem List).  PT reviewed hospitalist note and communicated with OT prior to tx intervention with family requesting SNF for short term rehab vs HHPT. PT able to assess pt and recommendation for patient to participate with continued inpatient follow up therapy, <3 hours/day with daughter whom was physically present unable to provide physical assist in home setting and hx of multiple falls. PT able to communicate at time of intervention with both daughters, one via phone and all parties in agreement with updated d/c plan. Pt resting in recliner when PT arrived. PT able to rouse pt and agreeable to participation with therapy. Pt required min A for sit to stand from recliner with min cues and exhibited improved acitivty tolerance with gait tasks in hallway 60 feet min guard and recliner close. Pt reported fatigue and SOB with O2 saturation 99% on RA. Pt reported no current pain, however some abdominal pain and cramping overnight. Pt returned to recliner, all needs in place and family present. Pt will benefit from acute skilled PT to increase their independence and safety with mobility to allow discharge.       Assistance Recommended at Discharge Intermittent Supervision/Assistance  If plan is discharge home, recommend the following:  Can travel by private vehicle    A little help with walking and/or transfers;Assistance with cooking/housework;Direct supervision/assist for medications management;Assist for  transportation;Help with stairs or ramp for entrance;A little help with bathing/dressing/bathroom   Yes  Equipment Recommendations  None recommended by PT    Recommendations for Other Services       Precautions / Restrictions Precautions Precautions: Fall Precaution Comments: incontinence, bring bari-brief Restrictions Weight Bearing Restrictions: No Other Position/Activity Restrictions: he reports 3 falls at home prior to hospitalization     Mobility  Bed Mobility               General bed mobility comments: pt seated in recliner when PT arrived    Transfers Overall transfer level: Needs assistance Equipment used: Rolling walker (2 wheels) Transfers: Sit to/from Stand Sit to Stand: Min assist           General transfer comment: cues for proper UE placement, power up and extension posture    Ambulation/Gait Ambulation/Gait assistance: Min guard Gait Distance (Feet): 60 Feet Assistive device: Rolling walker (2 wheels) Gait Pattern/deviations: Decreased stride length, Trunk flexed Gait velocity: decreaed     General Gait Details: PT noted RW elevated and pt reported feeling comfortable with the height. pt will benefit from bariatric RW in home setting, however deferr to next venue   Stairs             Wheelchair Mobility     Tilt Bed    Modified Rankin (Stroke Patients Only)       Balance Overall balance assessment: History of Falls, Needs assistance Sitting-balance support: Bilateral upper extremity supported Sitting balance-Leahy Scale: Good     Standing balance support: Bilateral upper extremity supported, Reliant on assistive device for balance Standing balance-Leahy Scale: Poor Standing balance comment:  min guard with B UE support at [employer] Arousal/Alertness: Awake/alert Behavior During Therapy: [facility] for tasks assessed/performed Overall Cognitive Status: Within Functional Limits for  tasks assessed                                 General Comments: Oriented x4, friendly, able to follow 1-2 step commands without difficulty        Exercises      General Comments        Pertinent Vitals/Pain Pain Assessment Pain Assessment: No/denies pain Pain Location: The pt denied having pain, however reported having abdominal discomfort and intermittent "cramping" over night    Home Living                          Prior Function            PT Goals (current goals can now be found in the care plan section) Acute Rehab PT Goals Patient Stated Goal: get stronger, get well again PT Goal Formulation: With patient Time For Goal Achievement: 06/16/23 Potential to Achieve Goals: Good Progress towards PT goals: Progressing toward goals    Frequency    Min 1X/week      PT Plan Current plan remains appropriate    Co-evaluation              AM-PAC PT "6 Clicks" Mobility   Outcome Measure  Help needed turning from your back to your side while in a flat bed without using bedrails?: None Help needed moving from lying on your back to sitting on the side of a flat bed without using bedrails?: A Little Help needed moving to and from a bed to a chair (including a wheelchair)?: A Little Help needed standing up from a chair using your arms (e.g., wheelchair or bedside chair)?: A Little Help needed to walk in hospital room?: A Little Help needed climbing 3-5 steps with a railing? : A Lot 6 Click Score: 18    End of Session Equipment Utilized During Treatment: Gait belt Activity Tolerance: Patient tolerated treatment well Patient left: with call bell/phone within reach;in chair;with chair alarm set Nurse Communication: Mobility status PT Visit Diagnosis: Muscle weakness (generalized) (M62.81);Difficulty in walking, not elsewhere classified (R26.2)     Time: 1105-1130 PT Time Calculation (min) (ACUTE ONLY): 25 min  Charges:    $Gait  Training: 8-22 mins $Therapeutic Activity: 8-22 mins                       Terry [name], PT Acute Rehab    Jacqualyn Posey 06/03/2023, 1:47 PM

## 2023-06-03 NOTE — Plan of Care (Signed)
  Problem: Education: Goal: Knowledge of General Education information will improve Description: Including pain rating scale, medication(s)/side effects and non-pharmacologic comfort measures Outcome: Progressing   Problem: Health Behavior/Discharge Planning: Goal: Ability to manage health-related needs will improve Outcome: Progressing   Problem: Clinical Measurements: Goal: Ability to maintain clinical measurements within normal limits will improve Outcome: Progressing Goal: Diagnostic test results will improve Outcome: Progressing Goal: Respiratory complications will improve Outcome: Progressing Goal: Cardiovascular complication will be avoided Outcome: Progressing   Problem: Activity: Goal: Risk for activity intolerance will decrease Outcome: Progressing   Problem: Nutrition: Goal: Adequate nutrition will be maintained Outcome: Progressing   Problem: Coping: Goal: Level of anxiety will decrease Outcome: Progressing   Problem: Elimination: Goal: Will not experience complications related to bowel motility Outcome: Progressing Goal: Will not experience complications related to urinary retention Outcome: Progressing   Problem: Pain Managment: Goal: General experience of comfort will improve Outcome: Progressing   Problem: Skin Integrity: Goal: Risk for impaired skin integrity will decrease Outcome: Progressing

## 2023-06-03 NOTE — Progress Notes (Signed)
Occupational Therapy Treatment Patient Details Name: Terry [name] MRN: [id number] DOB: 03/23/1941 Today's Date: 06/03/2023   History of present illness Mr. Bellomo is a 82 yr old male admitted to the hospital 05-31-23 with weakness, chills, and burning while urinating. PMH: recurrent UTI, DM II, HTN, a fib, HF, CKD 3, metastatic prostate CA, OHS, arthritis, lymphedema   OT comments  Pt was motivated to participate in the therapy session. He required assist to stand, to ambulate to the bathroom using a RW, as well as to perform toileting tasks at bathroom level and grooming in standing at the sink. He reported decreased feelings of wooziness today, as compared to yesterday. Pt and his daughter continue to express an interest in post-acute SNF rehab, as the pt is currently with functional limitations compromising his overall functional independence. Continue OT plan of care. Patient will benefit from continued inpatient follow up therapy, <3 hours/day.    Recommendations for follow up therapy are one component of a multi-disciplinary discharge planning process, led by the attending physician.  Recommendations may be updated based on patient status, additional functional criteria and insurance authorization.    Assistance Recommended at Discharge Frequent or constant Supervision/Assistance  Patient can return home with the following  Assist for transportation;Assistance with cooking/housework;A lot of help with bathing/dressing/bathroom;A little help with walking and/or transfers   Equipment Recommendations  BSC/3in1 (wide)    Recommendations for Other Services      Precautions / Restrictions Precautions Precautions: Fall Restrictions Weight Bearing Restrictions: No Other Position/Activity Restrictions: he reports 3 falls at home last week       Mobility Bed Mobility      General bed mobility comments: pt was received seated in the bedside chair    Transfers Overall transfer level:  Needs assistance Equipment used: Rolling walker (2 wheels) Transfers: Sit to/from Stand Sit to Stand: Min assist, Mod assist           General transfer comment: Min assist from bedside chair. Mod assist needed from low toilet         ADL either performed or assessed with clinical judgement   ADL Overall ADL's : Needs assistance/impaired Eating/Feeding: Independent;Sitting   Grooming: Min guard;Standing Grooming Details (indicate cue type and reason): He required steadying assist in order to perform hand washing, face washing, and teeth brushing at sink level.         Upper Body Dressing : Set up;Sitting   Lower Body Dressing: Maximal assistance;Sit to/from stand   Toilet Transfer: Minimal assistance;Rolling walker (2 wheels);Ambulation;Grab bars;Moderate assistance Toilet Transfer Details (indicate cue type and reason): Pt ambulated to the bathroom using a RW. He required verbal cues for walker placement & use of grab bar during toilet transfer. He required min assist to transfer onto the toilet, and mod assist to transfer off the toilet. Toileting- Clothing Manipulation and Hygiene: Moderate assistance;Sit to/from stand                Cognition Arousal/Alertness: Awake/alert Behavior During Therapy: WFL for tasks assessed/performed Overall Cognitive Status: Within Functional Limits for tasks assessed          General Comments: Oriented x4, friendly, able to follow 1-2 step commands without difficulty                   Pertinent Vitals/ Pain       Pain Assessment Pain Assessment: No/denies pain   Frequency  Min 1X/week        Progress Toward Goals  OT  Goals(current goals can now be found in the care plan section)     Acute Rehab OT Goals OT Goal Formulation: With patient Time For Goal Achievement: 06/16/23 Potential to Achieve Goals: Good  Plan Discharge plan remains appropriate       AM-PAC OT "6 Clicks" Daily Activity     Outcome  Measure   Help from another person eating meals?: None Help from another person taking care of personal grooming?: A Little Help from another person toileting, which includes using toliet, bedpan, or urinal?: A Lot Help from another person bathing (including washing, rinsing, drying)?: A Lot Help from another person to put on and taking off regular upper body clothing?: A Little Help from another person to put on and taking off regular lower body clothing?: A Lot 6 Click Score: 16    End of Session Equipment Utilized During Treatment: Gait belt;Rolling walker (2 wheels)  OT Visit Diagnosis: Unsteadiness on feet (R26.81);History of falling (Z91.81);Muscle weakness (generalized) (M62.81)   Activity Tolerance Patient tolerated treatment well   Patient Left in chair;with call bell/phone within reach;with family/visitor present;with chair alarm set   Nurse Communication Mobility status        Time: 1000-1024 OT Time Calculation (min): 24 min  Charges: OT General Charges $OT Visit: 1 Visit OT Treatments $Self Care/Home Management : 8-22 mins $Therapeutic Activity: 8-22 mins     Reuben Likes, OTR/L 06/03/2023, 12:00 PM

## 2023-06-03 NOTE — TOC Progression Note (Addendum)
Transition of Care [facility]) - Progression Note    Patient Details  Name: Terry [name] MRN: [id number] Date of Birth: Jun 07, 1941  Transition of Care [facility]) CM/SW Contact  Otelia Santee, LCSW Phone Number: 06/03/2023, 2:39 PM  Clinical Narrative:    Met with pt and daughter at bedside to discuss recommendation for SNF placement. Pt's daughter shares their top choices for SNF are [facility], [facility], and [facility] but, are open to similar facilities. Unable to obtain PASRR due to [facility] system down.  Referrals have been sent out for SNF. Pt is able to discharge to SNF pending bed offer/choice, insurance auth, and [place].    ADDENDUM: Was able to obtain PASRR for pt. ID: [id number].  Expected Discharge Plan: Home w Home Health Services Barriers to Discharge: Other (must enter comment)  Expected Discharge Plan and Services In-house Referral: Clinical Social Work Discharge Planning Services: NA Post Acute Care Choice: Skilled Nursing Facility Living arrangements for the past 2 months: Single Family Home Expected Discharge Date: 06/02/23               DME Arranged: Bedside commode DME Agency: [employer] Date DME Agency Contacted: 06/02/23 Time DME Agency Contacted: [phone] Representative spoke with at DME Agency: Zack HH Arranged: RN, PT, OT HH Agency: [facility] Date [facility] Agency Contacted: 06/02/23 Time HH Agency Contacted: [phone] Representative spoke with at [facility] Agency: Cindie   Social Determinants of Health (SDOH) Interventions SDOH Screenings   Food Insecurity: Food Insecurity Present (06/01/2023)  Housing: Medium Risk (06/01/2023)  Transportation Needs: No Transportation Needs (06/01/2023)  Utilities: Not At Risk (06/01/2023)  Alcohol Screen: Low Risk  (06/09/2022)  Depression (PHQ2-9): Low Risk  (01/14/2023)  Financial Resource Strain: Low Risk  (12/17/2022)  Physical Activity: Insufficiently Active (12/17/2022)  Social Connections: Socially Isolated (12/17/2022)   Stress: No Stress Concern Present (12/17/2022)  Tobacco Use: Low Risk  (05/31/2023)    Readmission Risk Interventions    06/02/2023    3:52 PM  Readmission Risk Prevention Plan  Transportation Screening Complete  PCP or Specialist Appt within 5-7 Days Complete  Home Care Screening Complete  Medication Review (RN CM) Complete

## 2023-06-03 NOTE — NC FL2 (Signed)
[place] MEDICAID FL2 LEVEL OF CARE FORM     IDENTIFICATION  Patient Name: Terry [name] Birthdate: 1941/01/31 Sex: male Admission Date (Current Location): 05/31/2023  [facility] and [place] Number:      Facility and Address:  [facility],  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) 02/24/2023   Asymptomatic microscopic hematuria 01/22/2022   Benign prostatic hyperplasia with lower urinary tract symptoms 01/22/2022   Nocturia 01/22/2022   Retained ureteral stent 01/22/2022   Goals of care, counseling/discussion 09/04/2021   Sepsis (HCC) 10/24/2020   Complicated UTI (urinary tract infection) 10/24/2020   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) 10/24/2020   CHF, chronic (HCC)    Prostate cancer (HCC)    Congestive heart failure (HCC) 07/02/2020   Macrocytic anemia 06/30/2020   Elevated PSA 06/30/2020   Cellulitis of lower extremity 06/30/2020   Prediabetes 01/04/2019   Paroxysmal atrial fibrillation (HCC) 01/01/2019   Risk for falls 12/27/2017   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HCC) 10/18/2017   Right ureteral stone 10/01/2017   Iron deficiency anemia due to chronic blood loss 06/22/2016   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   Depression with anxiety 06/19/2015   CKD (chronic kidney disease) stage 3, GFR 30-59 ml/min (HCC)  05/26/2015   Essential hypertension 11/01/2014   Gouty arthritis of toe 11/01/2014   Morbid obesity due to excess calories (HCC) 11/01/2014   Primary osteoarthritis of both knees 11/01/2014    Orientation RESPIRATION BLADDER Height & Weight     Self, Time, Situation, Place  Normal Continent Weight: (!) 137.4 kg Height:  5\' 7"  (170.2 cm)  BEHAVIORAL SYMPTOMS/MOOD NEUROLOGICAL BOWEL NUTRITION STATUS      Continent Diet (Heart healthy)  AMBULATORY STATUS COMMUNICATION OF NEEDS Skin   Limited Assist Verbally Normal                       Personal Care Assistance Level of Assistance  Bathing, Feeding, Dressing Bathing Assistance: Maximum assistance Feeding assistance: Independent Dressing Assistance: Maximum assistance     Functional Limitations Info  Sight, Hearing, Speech Sight Info: Adequate Hearing Info: Adequate Speech Info: Adequate    SPECIAL CARE FACTORS FREQUENCY  PT (By licensed PT), OT (By licensed OT)     PT Frequency: 5x/wk OT Frequency: 5x/wk            Contractures Contractures Info: Not present    Additional Factors Info  Code Status, Allergies Code Status Info: FULL Allergies Info: Lisinopril           Current Medications (06/03/2023):  This is the current hospital active medication list Current Facility-Administered Medications  Medication Dose Route Frequency Provider Last Rate Last Admin   allopurinol (ZYLOPRIM) tablet 50 mg  50 mg Oral Daily Crosley, Debby, MD   50 mg at 06/03/23 0943   amoxicillin (AMOXIL) capsule 500 mg  500  mg Oral TID Hughie Closs, MD   500 mg at 06/03/23 1345   apixaban (ELIQUIS) tablet 2.5 mg  2.5 mg Oral BID Hughie Closs, MD   2.5 mg at 06/03/23 0942   bisacodyl (DULCOLAX) suppository 10 mg  10 mg Rectal Daily PRN Hughie Closs, MD   10 mg at 06/02/23 1551   carvedilol (COREG) tablet 25 mg  25 mg Oral BID Gery Pray, MD   25 mg at 06/03/23 0942   hydrALAZINE (APRESOLINE) injection 10 mg  10 mg Intravenous  Q6H PRN Hughie Closs, MD   10 mg at 06/02/23 1727   hydrALAZINE (APRESOLINE) tablet 50 mg  50 mg Oral Q8H Pahwani, Daleen Bo, MD   50 mg at 06/03/23 1345   levothyroxine (SYNTHROID) tablet 50 mcg  50 mcg Oral Q0600 Gery Pray, MD   50 mcg at 06/03/23 0536   losartan (COZAAR) tablet 100 mg  100 mg Oral Daily Hughie Closs, MD   100 mg at 06/03/23 0943   polyethylene glycol (MIRALAX / GLYCOLAX) packet 17 g  17 g Oral Daily Hughie Closs, MD   17 g at 06/03/23 0944   predniSONE (DELTASONE) tablet 5 mg  5 mg Oral Q breakfast Joneen Roach, Debby, MD   5 mg at 06/03/23 3329   Facility-Administered Medications Ordered in Other Encounters  Medication Dose Route Frequency Provider Last Rate Last Admin   leuprolide (6 Month) (ELIGARD) injection 45 mg  45 mg Subcutaneous Q6 months Creig Hines, MD   45 mg at 12/28/22 1549     Discharge Medications: Please see discharge summary for a list of discharge medications.  Relevant Imaging Results:  Relevant Lab Results:   Additional Information [id number]  Otelia Santee, LCSW

## 2023-06-04 DIAGNOSIS — R531 Weakness: Secondary | ICD-10-CM | POA: Diagnosis not present

## 2023-06-04 LAB — BASIC METABOLIC PANEL
Anion gap: 5 (ref 5–15)
BUN: 23 mg/dL (ref 8–23)
CO2: 22 mmol/L (ref 22–32)
Calcium: 8.7 mg/dL — ABNORMAL LOW (ref 8.9–10.3)
Chloride: 108 mmol/L (ref 98–111)
Creatinine, Ser: 1.33 mg/dL — ABNORMAL HIGH (ref 0.61–1.24)
GFR, Estimated: 53 mL/min — ABNORMAL LOW (ref >60)
Glucose, Bld: 142 mg/dL — ABNORMAL HIGH (ref 70–99)
Potassium: 3.8 mmol/L (ref 3.5–5.1)
Sodium: 135 mmol/L (ref 135–145)

## 2023-06-04 MED ORDER — TRAMADOL HCL 50 MG PO TABS: 50.0000 mg | ORAL_TABLET | Freq: Three times a day (TID) | ORAL | 0 refills | Status: DC | PRN

## 2023-06-04 NOTE — Progress Notes (Signed)
Mobility Specialist - Progress Note   06/04/23 1038  Mobility  Activity Transferred from bed to chair  Level of Assistance Standby assist, set-up cues, supervision of patient - no hands on  Assistive Device Front wheel walker  Distance Ambulated (ft) 2 ft  Activity Response Tolerated well  Mobility Referral Yes  $Mobility charge 1 Mobility  Mobility Specialist Start Time (ACUTE ONLY) 1027  Mobility Specialist Stop Time (ACUTE ONLY) 1038  Mobility Specialist Time Calculation (min) (ACUTE ONLY) 11 min   Pt received in bed declining ambulation, but agreeable to transfer to recliner. Pt c/o back of knees feeling stiff. Pt was ModA from STS & SB during transfer. No other complaints during session. Pt to recliner after session with all needs met.    [facility]

## 2023-06-04 NOTE — Progress Notes (Signed)
PROGRESS NOTE    Terry [name]  YNW:[id number] DOB: 05/25/1941 DOA: 05/31/2023 PCP: Donita Brooks, MD   Brief Narrative:  HPI: This is a 82 year old male with past medical history of recurrent urinary tract infections , T2DM, HTN, hypothyroidism, paroxysmal atrial fibrillation, HFpEF,  chronic kidney disease stage III, metastatic prostate cancer, OHS, morbid obesity,  and deconditioning.  Patient has been complaining of losing strength the last 3 to 4 days.  At baseline the patient uses a wheelchair, walker, or a 4-prong seated walker depending on where he is.  In the home he uses a walker.  He found himself having difficulty walking around the home with his walker.  This was first noted 2 weeks ago but has gotten worse over the past 3 to 4 days.  He endorses burning urination for the last 2 weeks and chills.  He denies shortness of breath, cough, fever, nausea, vomiting or confusion.  Today while sitting on his 4-prong seated walker he found himself slipping onto the floor because of his weakness.  His family had him take him to the ER.   In the ER urinalysis is highly suggestive of acute cystitis.  Admission requested  Assessment & Plan:   Principal Problem:   Generalized weakness Active Problems:   CKD (chronic kidney disease) stage 3, GFR 30-59 ml/min (HCC)   Depression with anxiety   Essential hypertension   Morbid obesity due to excess calories (HCC)   CHF, chronic (HCC)   Prostate cancer (HCC)   Complicated UTI (urinary tract infection)   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)   Benign prostatic hyperplasia with lower urinary tract symptoms   Acute cystitis  Acute UTI/cystitis: Urine culture Enterococcus.  Switching antibiotics to amoxicillin.  Generalized weakness: Has chronic debility and deconditioning, typically wheelchair and sometimes walker bound.  Seen by PT OT and initially they recommended home with home health.  Family unhappy and  resistant for discharge on 06/02/2023.  Please see my note separately on that day.  Per family request, discharge was canceled and per their request, patient was reassessed by PT 06/03/2023 and they recommend SNF.  TOC consulted.  Metastatic prostate cancer: Xgeva injection every 3 months, Zytiga daily, Eligard every 6 months. Continue daily prednisone  Chronic heart failure with preserved ejection fraction: Appears euvolemic.  Continue home medications which include Lasix, Coreg and losartan.  Acquired hypothyroidism: Continue Synthroid.  CKD stage IIIa: Stable.  Hypokalemia: will replace.   Essential hypertension: Patient already on Coreg maximized.  25 mg p.o. twice daily.  Increased losartan to 100 mg 06/02/2023 and added hydralazine 25 mg p.o. 3 times daily and increased to 50 mg p.o. 3 times daily on 06/03/2023.  Blood pressure finally is much better controlled.  Continue current regimen.  Paroxysmal atrial fibrillation: Rate controlled.  Continue Coreg and Eliquis.  Morbid obesity: Weight loss and dietary modification counseled.  DVT prophylaxis: apixaban (ELIQUIS) tablet 2.5 mg Start: 06/02/23 1000   Code Status: Prior  Family Communication: None present at bedside.  Plan of care discussed with patient in length and he/she verbalized understanding and agreed with it.  Status is: Inpatient Remains inpatient appropriate because: Medically stable, waiting for placement to SNF.  Estimated body mass index is 47.44 kg/m as calculated from the following:   Height as of this encounter: 5\' 7"  (1.702 m).   Weight as of this encounter: 137.4 kg.    Nutritional Assessment: Body mass index is 47.44 kg/m.Marland Kitchen Seen by dietician.  I  agree with the assessment and plan as outlined below: Nutrition Status:        . Skin Assessment: I have examined the patient's skin and I agree with the wound assessment as performed by the wound care RN as outlined below:    Consultants:   None  Procedures:  None  Antimicrobials:  Anti-infectives (From admission, onward)    Start     Dose/Rate Route Frequency Ordered Stop   06/03/23 1200  amoxicillin (AMOXIL) capsule 500 mg        500 mg Oral 3 times daily 06/03/23 1111     06/02/23 0000  amoxicillin (AMOXIL) 500 MG capsule        500 mg Oral 3 times daily 06/02/23 1223 06/09/23 2359   06/01/23 2200  cefTRIAXone (ROCEPHIN) 1 g in sodium chloride 0.9 % 100 mL IVPB  Status:  Discontinued        1 g 200 mL/hr over 30 Minutes Intravenous Every 24 hours 06/01/23 0716 06/03/23 1111   05/31/23 2245  cefTRIAXone (ROCEPHIN) 1 g in sodium chloride 0.9 % 100 mL IVPB        1 g 200 mL/hr over 30 Minutes Intravenous  Once 05/31/23 2241 06/01/23 0106         Subjective: Patient seen and examined.  No complaints.  Objective: Vitals:   06/04/23 0050 06/04/23 0520 06/04/23 0906 06/04/23 1307  BP: (!) 156/106 (!) 166/80 (!) 148/67 135/75  Pulse: 78 71 71 61  Resp: 16 18 20 20   Temp: 98 F (36.7 C) 98.5 F (36.9 C) 98 F (36.7 C) 97.8 F (36.6 C)  TempSrc:  Oral Oral Oral  SpO2: 97% 96% 97% 98%  Weight:      Height:        Intake/Output Summary (Last 24 hours) at 06/04/2023 1355 Last data filed at 06/04/2023 0815 Gross per 24 hour  Intake 590 ml  Output --  Net 590 ml   Filed Weights   05/31/23 1629 06/01/23 0321 06/03/23 0605  Weight: (!) 142.9 kg (!) 140.4 kg (!) 137.4 kg    Examination:  General exam: Appears calm and comfortable, morbidly obese Respiratory system: Clear to auscultation. Respiratory effort normal. Cardiovascular system: S1 & S2 heard, RRR. No JVD, murmurs, rubs, gallops or clicks. No pedal edema. Gastrointestinal system: Abdomen is nondistended, soft and nontender. No organomegaly or masses felt. Normal bowel sounds heard. Central nervous system: Alert and oriented. No focal neurological deficits. Extremities: Symmetric 5 x 5 power. Skin: No rashes, lesions or ulcers.  Psychiatry:  Judgement and insight appear normal. Mood & affect appropriate.    Data Reviewed: I have personally reviewed following labs and imaging studies  CBC: Recent Labs  Lab 05/31/23 1713 06/01/23 1358 06/02/23 0821  WBC 8.9 6.4 8.4  NEUTROABS 7.0  --  6.5  HGB 11.6* 12.0* 11.9*  HCT 37.4* 40.5 39.1  MCV 78.6* 81.5 80.3  PLT 191 186 191   Basic Metabolic Panel: Recent Labs  Lab 05/31/23 2055 06/01/23 1358 06/02/23 0821 06/03/23 0844 06/04/23 0952  NA 142  --  138 139 135  K 3.7  --  3.2* 3.3* 3.8  CL 107  --  107 108 108  CO2 27  --  24 23 22   GLUCOSE 97  --  124* 109* 142*  BUN 23  --  19 19 23   CREATININE 1.41* 1.30* 1.24 1.30* 1.33*  CALCIUM 9.5  --  8.8* 8.9 8.7*  MG 2.0  --   --   --   --  GFR: Estimated Creatinine Clearance: 57.3 mL/min (A) (by C-G formula based on SCr of 1.33 mg/dL (H)). Liver Function Tests: Recent Labs  Lab 05/31/23 2055  AST 14*  ALT 12  ALKPHOS 60  BILITOT 0.6  PROT 7.5  ALBUMIN 3.7   No results for input(s): "LIPASE", "AMYLASE" in the last 168 hours. No results for input(s): "AMMONIA" in the last 168 hours. Coagulation Profile: No results for input(s): "INR", "PROTIME" in the last 168 hours. Cardiac Enzymes: No results for input(s): "CKTOTAL", "CKMB", "CKMBINDEX", "TROPONINI" in the last 168 hours. BNP (last 3 results) No results for input(s): "PROBNP" in the last 8760 hours. HbA1C: No results for input(s): "HGBA1C" in the last 72 hours. CBG: No results for input(s): "GLUCAP" in the last 168 hours. Lipid Profile: No results for input(s): "CHOL", "HDL", "LDLCALC", "TRIG", "CHOLHDL", "LDLDIRECT" in the last 72 hours. Thyroid Function Tests: No results for input(s): "TSH", "T4TOTAL", "FREET4", "T3FREE", "THYROIDAB" in the last 72 hours. Anemia Panel: No results for input(s): "VITAMINB12", "FOLATE", "FERRITIN", "TIBC", "IRON", "RETICCTPCT" in the last 72 hours. Sepsis Labs: No results for input(s): "PROCALCITON", "LATICACIDVEN"  in the last 168 hours.  Recent Results (from the past 240 hour(s))  Urine Culture (for pregnant, neutropenic or urologic patients or patients with an indwelling urinary catheter)     Status: Abnormal   Collection Time: 05/31/23  6:12 PM   Specimen: Urine, Clean Catch  Result Value Ref Range Status   Specimen Description   Final    URINE, CLEAN CATCH Performed at [facility], [address] [place], [place] [address]    Special Requests   Final    NONE Performed at [facility], [address], [place], [place] [address]    Culture 30,000 COLONIES/mL ENTEROCOCCUS FAECALIS (A)  Final   Report Status 06/03/2023 FINAL  Final   Organism ID, Bacteria ENTEROCOCCUS FAECALIS (A)  Final      Susceptibility   Enterococcus faecalis - MIC*    AMPICILLIN <=2 SENSITIVE Sensitive     NITROFURANTOIN <=16 SENSITIVE Sensitive     VANCOMYCIN 1 SENSITIVE Sensitive     * 30,000 COLONIES/mL ENTEROCOCCUS FAECALIS     Radiology Studies: No results found.  Scheduled Meds:  allopurinol  50 mg Oral Daily   amoxicillin  500 mg Oral TID   apixaban  2.5 mg Oral BID   carvedilol  25 mg Oral BID   hydrALAZINE  50 mg Oral Q8H   levothyroxine  50 mcg Oral Q0600   losartan  100 mg Oral Daily   polyethylene glycol  17 g Oral Daily   predniSONE  5 mg Oral Q breakfast   Continuous Infusions:     LOS: 3 days   Hughie Closs, MD [facility]  06/04/2023, 1:55 PM   *Please note that this is a verbal dictation therefore any spelling or grammatical errors are due to the "Dragon Medical One" system interpretation.  Please page via Amion and do not message via secure chat for urgent patient care matters. Secure chat can be used for non urgent patient care matters.  How to contact the [facility] Attending or Consulting provider 7A - 7P or covering provider during after hours 7P -7A, for this patient?  Check the care team in [facility] and look for a) attending/consulting TRH  provider listed and b) the [facility] team listed. Page or secure chat 7A-7P. Log into [email or web address] and use [facility]'s universal password to access. If you do not have the password, please contact the  hospital operator. Locate the [facility] provider you are looking for under [facility] and page to a number that you can be directly reached. If you still have difficulty reaching the provider, please page the [facility] (Director on Call) for the Hospitalists listed on amion for assistance.

## 2023-06-04 NOTE — Progress Notes (Signed)
PRN Hydralazine given   06/03/23 1947  Assess: MEWS Score  Temp 97.8 F (36.6 C)  BP (!) 207/106  MAP (mmHg) 130  Pulse Rate 71  Resp 15  SpO2 98 %  O2 Device Room Air  Assess: MEWS Score  MEWS Temp 0  MEWS Systolic 2  MEWS Pulse 0  MEWS RR 0  MEWS LOC 0  MEWS Score 2  MEWS Score Color Yellow  Assess: if the MEWS score is Yellow or Red  Were vital signs taken at a resting state? Yes  Focused Assessment No change from prior assessment  Does the patient meet 2 or more of the SIRS criteria? No  MEWS guidelines implemented  Yes, yellow  Treat  MEWS Interventions Considered administering scheduled or prn medications/treatments as ordered  Take Vital Signs  Increase Vital Sign Frequency  Yellow: Q2hr x1, continue Q4hrs until patient remains green for 12hrs  Escalate  MEWS: Escalate Yellow: Discuss with charge nurse and consider notifying provider and/or RRT  Notify: Charge Nurse/RN  Name of Charge Nurse/RN Notified Vera RN  Assess: SIRS CRITERIA  SIRS Temperature  0  SIRS Pulse 0  SIRS Respirations  0  SIRS WBC 0  SIRS Score Sum  0

## 2023-06-04 NOTE — Plan of Care (Signed)
  Problem: Education: Goal: Knowledge of General Education information will improve Description: Including pain rating scale, medication(s)/side effects and non-pharmacologic comfort measures Outcome: Progressing   Problem: Clinical Measurements: Goal: Ability to maintain clinical measurements within normal limits will improve Outcome: Progressing Goal: Will remain free from infection Outcome: Progressing Goal: Diagnostic test results will improve Outcome: Progressing Goal: Respiratory complications will improve Outcome: Progressing Goal: Cardiovascular complication will be avoided Outcome: Progressing   Problem: Pain Managment: Goal: General experience of comfort will improve Outcome: Progressing   Problem: Safety: Goal: Ability to remain free from injury will improve Outcome: Progressing   

## 2023-06-04 NOTE — TOC Progression Note (Signed)
Transition of Care [facility]) - Progression Note    Patient Details  Name: Terry [name] MRN: [id number] Date of Birth: 02-06-1941  [facility] CM/SW Contact  Otelia Santee, LCSW Phone Number: 06/04/2023, 11:52 AM  Clinical Narrative:    Reviewed SNF bed offers with pt in room and daughter via t/c. Pt has accepted bed offer for [facility]. Insurance Berkley Harvey has been requested and is currently pending.    Expected Discharge Plan: Home w Home Health Services Barriers to Discharge: Other (must enter comment)  Expected Discharge Plan and Services In-house Referral: Clinical Social Work Discharge Planning Services: NA Post Acute Care Choice: Skilled Nursing Facility Living arrangements for the past 2 months: Single Family Home Expected Discharge Date: 06/02/23               DME Arranged: Bedside commode DME Agency: [employer] Date DME Agency Contacted: 06/02/23 Time DME Agency Contacted: [phone] Representative spoke with at DME Agency: Zack HH Arranged: RN, PT, OT HH Agency: [facility] Date [facility] Agency Contacted: 06/02/23 Time HH Agency Contacted: [phone] Representative spoke with at [facility] Agency: Cindie   Social Determinants of Health (SDOH) Interventions SDOH Screenings   Food Insecurity: Food Insecurity Present (06/01/2023)  Housing: Medium Risk (06/01/2023)  Transportation Needs: No Transportation Needs (06/01/2023)  Utilities: Not At Risk (06/01/2023)  Alcohol Screen: Low Risk  (06/09/2022)  Depression (PHQ2-9): Low Risk  (01/14/2023)  Financial Resource Strain: Low Risk  (12/17/2022)  Physical Activity: Insufficiently Active (12/17/2022)  Social Connections: Socially Isolated (12/17/2022)  Stress: No Stress Concern Present (12/17/2022)  Tobacco Use: Low Risk  (05/31/2023)    Readmission Risk Interventions    06/02/2023    3:52 PM  Readmission Risk Prevention Plan  Transportation Screening Complete  PCP or Specialist Appt within 5-7 Days Complete  Home Care  Screening Complete  Medication Review (RN CM) Complete

## 2023-06-05 DIAGNOSIS — R531 Weakness: Secondary | ICD-10-CM | POA: Diagnosis not present

## 2023-06-05 LAB — BASIC METABOLIC PANEL
Anion gap: 7 (ref 5–15)
BUN: 26 mg/dL — ABNORMAL HIGH (ref 8–23)
CO2: 24 mmol/L (ref 22–32)
Calcium: 9.1 mg/dL (ref 8.9–10.3)
Chloride: 109 mmol/L (ref 98–111)
Creatinine, Ser: 1.45 mg/dL — ABNORMAL HIGH (ref 0.61–1.24)
GFR, Estimated: 48 mL/min — ABNORMAL LOW (ref >60)
Glucose, Bld: 94 mg/dL (ref 70–99)
Potassium: 3.7 mmol/L (ref 3.5–5.1)
Sodium: 140 mmol/L (ref 135–145)

## 2023-06-05 NOTE — Progress Notes (Signed)
PROGRESS NOTE    Terry [name]  WGN:[id number] DOB: [birth year] DOA: 05/31/2023 PCP: Donita Brooks, MD   Brief Narrative:  HPI: This is a 82 year old male with past medical history of recurrent urinary tract infections , T2DM, HTN, hypothyroidism, paroxysmal atrial fibrillation, HFpEF,  chronic kidney disease stage III, metastatic prostate cancer, OHS, morbid obesity,  and deconditioning.  Patient has been complaining of losing strength the last 3 to 4 days.  At baseline the patient uses a wheelchair, walker, or a 4-prong seated walker depending on where he is.  In the home he uses a walker.  He found himself having difficulty walking around the home with his walker.  This was first noted 2 weeks ago but has gotten worse over the past 3 to 4 days.  He endorses burning urination for the last 2 weeks and chills.  He denies shortness of breath, cough, fever, nausea, vomiting or confusion.  Today while sitting on his 4-prong seated walker he found himself slipping onto the floor because of his weakness.  His family had him take him to the ER.   In the ER urinalysis is highly suggestive of acute cystitis.  Admission requested  Assessment & Plan:   Principal Problem:   Generalized weakness Active Problems:   CKD (chronic kidney disease) stage 3, GFR 30-59 ml/min (HCC)   Depression with anxiety   Essential hypertension   Morbid obesity due to excess calories (HCC)   CHF, chronic (HCC)   Prostate cancer (HCC)   Complicated UTI (urinary tract infection)   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)   Benign prostatic hyperplasia with lower urinary tract symptoms   Acute cystitis  Acute UTI/cystitis: Urine culture Enterococcus.  Switching antibiotics to amoxicillin.  Generalized weakness: Has chronic debility and deconditioning, typically wheelchair and sometimes walker bound.  Seen by PT OT and initially they recommended home with home health.  Family unhappy and  resistant for discharge on 06/02/2023.  Please see my note separately on that day.  Per family request, discharge was canceled and per their request, patient was reassessed by PT 06/03/2023 and they recommend SNF.  TOC on board.  Metastatic prostate cancer: Xgeva injection every 3 months, Zytiga daily, Eligard every 6 months. Continue daily prednisone  Chronic heart failure with preserved ejection fraction: Appears euvolemic.  Continue home medications which include Lasix, Coreg and losartan.  Acquired hypothyroidism: Continue Synthroid.  CKD stage IIIa: Stable.  Hypokalemia: will replace.   Essential hypertension: Patient already on Coreg maximized.  25 mg p.o. twice daily.  Increased losartan to 100 mg 06/02/2023 and added hydralazine 25 mg p.o. 3 times daily and increased to 50 mg p.o. 3 times daily on 06/03/2023.  Blood pressure finally is much better controlled.  Continue current regimen.  Paroxysmal atrial fibrillation: Rate controlled.  Continue Coreg and Eliquis.  Morbid obesity: Weight loss and dietary modification counseled.  DVT prophylaxis: apixaban (ELIQUIS) tablet 2.5 mg Start: 06/02/23 1000   Code Status: Prior  Family Communication: None present at bedside.  Plan of care discussed with patient in length and he/she verbalized understanding and agreed with it.  Status is: Inpatient Remains inpatient appropriate because: Medically stable, waiting for placement to SNF.  Estimated body mass index is 47.44 kg/m as calculated from the following:   Height as of this encounter: 5\' 7"  (1.702 m).   Weight as of this encounter: 137.4 kg.    Nutritional Assessment: Body mass index is 47.44 kg/m.Marland Kitchen Seen by dietician.  I agree with the assessment and plan as outlined below: Nutrition Status:        . Skin Assessment: I have examined the patient's skin and I agree with the wound assessment as performed by the wound care RN as outlined below:    Consultants:   None  Procedures:  None  Antimicrobials:  Anti-infectives (From admission, onward)    Start     Dose/Rate Route Frequency Ordered Stop   06/03/23 1200  amoxicillin (AMOXIL) capsule 500 mg        500 mg Oral 3 times daily 06/03/23 1111     06/02/23 0000  amoxicillin (AMOXIL) 500 MG capsule        500 mg Oral 3 times daily 06/02/23 1223 06/09/23 2359   06/01/23 2200  cefTRIAXone (ROCEPHIN) 1 g in sodium chloride 0.9 % 100 mL IVPB  Status:  Discontinued        1 g 200 mL/hr over 30 Minutes Intravenous Every 24 hours 06/01/23 0716 06/03/23 1111   05/31/23 2245  cefTRIAXone (ROCEPHIN) 1 g in sodium chloride 0.9 % 100 mL IVPB        1 g 200 mL/hr over 30 Minutes Intravenous  Once 05/31/23 2241 06/01/23 0106         Subjective: Patient seen and examined.  He has no complaints.  He is aware that we are waiting for insurance authorization at this point in time.  Per TOC, authorization is still pending.  Objective: Vitals:   06/04/23 1715 06/04/23 2118 06/05/23 0624 06/05/23 1236  BP: (!) 153/70 (!) 154/71 (!) 132/57 (!) 148/73  Pulse: (!) 59 62 (!) 59 (!) 58  Resp: 20 20 18 20   Temp: 97.9 F (36.6 C) 98.2 F (36.8 C) 98.3 F (36.8 C) 98.4 F (36.9 C)  TempSrc: Oral Oral Oral Oral  SpO2: 99% 98% 98% 97%  Weight:      Height:        Intake/Output Summary (Last 24 hours) at 06/05/2023 1433 Last data filed at 06/05/2023 0900 Gross per 24 hour  Intake 360 ml  Output --  Net 360 ml   Filed Weights   05/31/23 1629 06/01/23 0321 06/03/23 0605  Weight: (!) 142.9 kg (!) 140.4 kg (!) 137.4 kg    Examination:  General exam: Appears calm and comfortable, morbidly obese Respiratory system: Clear to auscultation. Respiratory effort normal. Cardiovascular system: S1 & S2 heard, RRR. No JVD, murmurs, rubs, gallops or clicks. No pedal edema. Gastrointestinal system: Abdomen is nondistended, soft and nontender. No organomegaly or masses felt. Normal bowel sounds heard. Central  nervous system: Alert and oriented. No focal neurological deficits. Extremities: Symmetric 5 x 5 power. Skin: No rashes, lesions or ulcers.  Psychiatry: Judgement and insight appear normal. Mood & affect appropriate.     Data Reviewed: I have personally reviewed following labs and imaging studies  CBC: Recent Labs  Lab 05/31/23 1713 06/01/23 1358 06/02/23 0821  WBC 8.9 6.4 8.4  NEUTROABS 7.0  --  6.5  HGB 11.6* 12.0* 11.9*  HCT 37.4* 40.5 39.1  MCV 78.6* 81.5 80.3  PLT 191 186 191   Basic Metabolic Panel: Recent Labs  Lab 05/31/23 2055 06/01/23 1358 06/02/23 0821 06/03/23 0844 06/04/23 0952 06/05/23 0551  NA 142  --  138 139 135 140  K 3.7  --  3.2* 3.3* 3.8 3.7  CL 107  --  107 108 108 109  CO2 27  --  24 23 22 24   GLUCOSE 97  --  124* 109* 142* 94  BUN 23  --  19 19 23  26*  CREATININE 1.41* 1.30* 1.24 1.30* 1.33* 1.45*  CALCIUM 9.5  --  8.8* 8.9 8.7* 9.1  MG 2.0  --   --   --   --   --    GFR: Estimated Creatinine Clearance: 52.6 mL/min (A) (by C-G formula based on SCr of 1.45 mg/dL (H)). Liver Function Tests: Recent Labs  Lab 05/31/23 2055  AST 14*  ALT 12  ALKPHOS 60  BILITOT 0.6  PROT 7.5  ALBUMIN 3.7   No results for input(s): "LIPASE", "AMYLASE" in the last 168 hours. No results for input(s): "AMMONIA" in the last 168 hours. Coagulation Profile: No results for input(s): "INR", "PROTIME" in the last 168 hours. Cardiac Enzymes: No results for input(s): "CKTOTAL", "CKMB", "CKMBINDEX", "TROPONINI" in the last 168 hours. BNP (last 3 results) No results for input(s): "PROBNP" in the last 8760 hours. HbA1C: No results for input(s): "HGBA1C" in the last 72 hours. CBG: No results for input(s): "GLUCAP" in the last 168 hours. Lipid Profile: No results for input(s): "CHOL", "HDL", "LDLCALC", "TRIG", "CHOLHDL", "LDLDIRECT" in the last 72 hours. Thyroid Function Tests: No results for input(s): "TSH", "T4TOTAL", "FREET4", "T3FREE", "THYROIDAB" in the last  72 hours. Anemia Panel: No results for input(s): "VITAMINB12", "FOLATE", "FERRITIN", "TIBC", "IRON", "RETICCTPCT" in the last 72 hours. Sepsis Labs: No results for input(s): "PROCALCITON", "LATICACIDVEN" in the last 168 hours.  Recent Results (from the past 240 hour(s))  Urine Culture (for pregnant, neutropenic or urologic patients or patients with an indwelling urinary catheter)     Status: Abnormal   Collection Time: 05/31/23  6:12 PM   Specimen: Urine, Clean Catch  Result Value Ref Range Status   Specimen Description   Final    URINE, CLEAN CATCH Performed at [facility], [address] [place], [place] [address]    Special Requests   Final    NONE Performed at [facility], [address], [place], [place] [address]    Culture 30,000 COLONIES/mL ENTEROCOCCUS FAECALIS (A)  Final   Report Status 06/03/2023 FINAL  Final   Organism ID, Bacteria ENTEROCOCCUS FAECALIS (A)  Final      Susceptibility   Enterococcus faecalis - MIC*    AMPICILLIN <=2 SENSITIVE Sensitive     NITROFURANTOIN <=16 SENSITIVE Sensitive     VANCOMYCIN 1 SENSITIVE Sensitive     * 30,000 COLONIES/mL ENTEROCOCCUS FAECALIS     Radiology Studies: No results found.  Scheduled Meds:  allopurinol  50 mg Oral Daily   amoxicillin  500 mg Oral TID   apixaban  2.5 mg Oral BID   carvedilol  25 mg Oral BID   hydrALAZINE  50 mg Oral Q8H   levothyroxine  50 mcg Oral Q0600   losartan  100 mg Oral Daily   polyethylene glycol  17 g Oral Daily   predniSONE  5 mg Oral Q breakfast   Continuous Infusions:     LOS: 4 days   Hughie Closs, MD [facility]  06/05/2023, 2:33 PM   *Please note that this is a verbal dictation therefore any spelling or grammatical errors are due to the "Dragon Medical One" system interpretation.  Please page via Amion and do not message via secure chat for urgent patient care matters. Secure chat can be used for non urgent patient care  matters.  How to contact the [facility] Attending or Consulting provider 7A - 7P or covering provider during after hours  7P -7A, for this patient?  Check the care team in [facility] and look for a) attending/consulting TRH provider listed and b) the [facility] team listed. Page or secure chat 7A-7P. Log into [email or web address] and use [place]'s universal password to access. If you do not have the password, please contact the hospital operator. Locate the [facility] provider you are looking for under [facility] and page to a number that you can be directly reached. If you still have difficulty reaching the provider, please page the [facility] (Director on Call) for the Hospitalists listed on amion for assistance.

## 2023-06-05 NOTE — TOC Progression Note (Signed)
Transition of Care [facility]) - Progression Note    Patient Details  Name: Terry [name] MRN: [id number] Date of Birth: 1940/12/15  Transition of Care [facility] CM/SW Contact  Otelia Santee, LCSW Phone Number: 06/05/2023, 10:40 AM  Clinical Narrative:    Pt's insurance auth still pending. CSW will continue to check throughout the day for determination.    Expected Discharge Plan: Home w Home Health Services Barriers to Discharge: Other (must enter comment)  Expected Discharge Plan and Services In-house Referral: Clinical Social Work Discharge Planning Services: NA Post Acute Care Choice: Skilled Nursing Facility Living arrangements for the past 2 months: Single Family Home Expected Discharge Date: 06/02/23               DME Arranged: Bedside commode DME Agency: [employer] Date DME Agency Contacted: 06/02/23 Time DME Agency Contacted: [phone] Representative spoke with at DME Agency: Zack HH Arranged: RN, PT, OT HH Agency: [facility] Date [facility] Agency Contacted: 06/02/23 Time HH Agency Contacted: [phone] Representative spoke with at [facility] Agency: Cindie   Social Determinants of Health (SDOH) Interventions SDOH Screenings   Food Insecurity: Food Insecurity Present (06/01/2023)  Housing: Medium Risk (06/01/2023)  Transportation Needs: No Transportation Needs (06/01/2023)  Utilities: Not At Risk (06/01/2023)  Alcohol Screen: Low Risk  (06/09/2022)  Depression (PHQ2-9): Low Risk  (01/14/2023)  Financial Resource Strain: Low Risk  (12/17/2022)  Physical Activity: Insufficiently Active (12/17/2022)  Social Connections: Socially Isolated (12/17/2022)  Stress: No Stress Concern Present (12/17/2022)  Tobacco Use: Low Risk  (05/31/2023)    Readmission Risk Interventions    06/02/2023    3:52 PM  Readmission Risk Prevention Plan  Transportation Screening Complete  PCP or Specialist Appt within 5-7 Days Complete  Home Care Screening Complete  Medication Review (RN CM) Complete

## 2023-06-05 NOTE — Progress Notes (Signed)
Mobility Specialist - Progress Note   06/05/23 1021  Mobility  Activity Ambulated with assistance in hallway  Level of Assistance Standby assist, set-up cues, supervision of patient - no hands on  Assistive Device Front wheel walker  Distance Ambulated (ft) 120 ft  Activity Response Tolerated well  Mobility Referral Yes  $Mobility charge 1 Mobility  Mobility Specialist Start Time (ACUTE ONLY) 1003  Mobility Specialist Stop Time (ACUTE ONLY) 1021  Mobility Specialist Time Calculation (min) (ACUTE ONLY) 18 min   Pt received in recliner and agreeable to mobility. Pt was ModA from STS. Pt took x3 standing rest breaks due to fatigue. No complaints during session. Pt to recliner after session with all needs met.    [facility]

## 2023-06-06 ENCOUNTER — Other Ambulatory Visit (HOSPITAL_COMMUNITY): Payer: Self-pay

## 2023-06-06 DIAGNOSIS — R531 Weakness: Secondary | ICD-10-CM | POA: Diagnosis not present

## 2023-06-06 MED ORDER — HYDRALAZINE HCL 50 MG PO TABS: 50.0000 mg | ORAL_TABLET | Freq: Three times a day (TID) | ORAL | 0 refills | Status: AC

## 2023-06-06 MED ORDER — LOSARTAN POTASSIUM 100 MG PO TABS: 100.0000 mg | ORAL_TABLET | Freq: Every day | ORAL | 0 refills | Status: DC

## 2023-06-06 NOTE — Discharge Summary (Signed)
Physician Discharge Summary  Terry [name]:[id number] DOB: [birth year] DOA: 05/31/2023  PCP: Donita Brooks, MD  Admit date: 05/31/2023 Discharge date: 06/06/2023 30 Day Unplanned Readmission Risk Score    Flowsheet Row ED to Hosp-Admission (Current) from 05/31/2023 in [facility]  30 Day Unplanned Readmission Risk Score (%) 19.69 Filed at 06/06/2023 1200       This score is the patient's risk of an unplanned readmission within 30 days of being discharged (0 -100%). The score is based on dignosis, age, lab data, medications, orders, and past utilization.   Low:  0-14.9   Medium: 15-21.9   High: 22-29.9   Extreme: 30 and above          Admitted From: Home Disposition: SNF  Recommendations for Outpatient Follow-up:  Follow up with PCP in 1-2 weeks Please obtain BMP/CBC in one week Please follow up with your PCP on the following pending results: Unresulted Labs (From admission, onward)    None         Home Health: None Equipment/Devices: None  Discharge Condition: Stable CODE STATUS: Full code Diet recommendation: Cardiac/low-sodium  Subjective: Seen and examined.  No complaints.  He has remained stable for last several days.  Brief/Interim Summary: This is a 82 year old male with past medical history of recurrent urinary tract infections , T2DM, HTN, hypothyroidism, paroxysmal atrial fibrillation, HFpEF,  chronic kidney disease stage III, metastatic prostate cancer, OHS, morbid obesity,  and deconditioning presented with weakness. At baseline the patient uses a wheelchair, walker, or a 4-prong seated walker depending on where he is.  In the home he uses a walker.  He found himself having difficulty walking around the home with his walker.  This was first noted 2 weeks ago but has gotten worse over the past 3 to 4 days.  He endorses burning urination for the last 2 weeks and chills.  He denied shortness of breath, cough, fever,  nausea, vomiting or confusion.  He was diagnosed with UTI in the ED and admitted under hospitalist service.  Started on Rocephin, eventually urine culture grew Enterococcus, he was transition to amoxicillin and received 5 more days of antibiotics and completed course here.  He has remained afebrile without any symptoms for the last several days.   Generalized weakness: Has chronic debility and deconditioning, typically wheelchair and sometimes walker bound.   patient was reassessed by PT 06/03/2023 and they recommend SNF.  Patient is being discharged to SNF today.   Metastatic prostate cancer: Xgeva injection every 3 months, Zytiga daily, Eligard every 6 months. Continue daily prednisone   Chronic heart failure with preserved ejection fraction: Appears euvolemic.  Continue home medications which include Lasix, Coreg and losartan.   Acquired hypothyroidism: Continue Synthroid.   CKD stage IIIa: Stable.   Hypokalemia: Resolved.   Essential hypertension: Patient's blood pressure was significantly elevated.  Patient already on Coreg maximized.  25 mg p.o. twice daily.  Increased losartan to 100 mg 06/02/2023 and added hydralazine 25 mg p.o. 3 times daily and increased to 50 mg p.o. 3 times daily on 06/03/2023.  Blood pressure has been fairly stable since then.  Being discharged on this regimen.   Paroxysmal atrial fibrillation: Rate controlled.  Continue Coreg and Eliquis.   Morbid obesity: Weight loss and dietary modification counseled.  Discharge plan was discussed with patient and/or family member and they verbalized understanding and agreed with it.  Discharge Diagnoses:  Principal Problem:   Generalized weakness Active Problems:  CKD (chronic kidney disease) stage 3, GFR 30-59 ml/min (HCC)   Depression with anxiety   Essential hypertension   Morbid obesity due to excess calories (HCC)   CHF, chronic (HCC)   Prostate cancer (HCC)   Complicated UTI (urinary tract infection)   Chronic  heart failure with preserved ejection fraction (HFpEF) (HCC)   Benign prostatic hyperplasia with lower urinary tract symptoms   Acute cystitis    Discharge Instructions   Allergies as of 06/06/2023       Reactions   Lisinopril    FACIAL SWELLING        Medication List     STOP taking these medications    cephALEXin 500 MG capsule Commonly known as: KEFLEX       TAKE these medications    abiraterone acetate 250 MG tablet Commonly known as: ZYTIGA Take 4 tablets (1,000 mg total) by mouth daily. Take on an empty stomach 1 hour before or 2 hours after a meal What changed:  how much to take when to take this   allopurinol 100 MG tablet Commonly known as: ZYLOPRIM Take 0.5 tablets (50 mg total) by mouth daily.   apixaban 2.5 MG Tabs tablet Commonly known as: ELIQUIS Take 1 tablet (2.5 mg total) by mouth 2 (two) times daily.   carvedilol 25 MG tablet Commonly known as: COREG Take 25 mg by mouth 2 (two) times daily.   gabapentin 300 MG capsule Commonly known as: NEURONTIN Take 1 capsule (300 mg total) by mouth 3 times/day as needed-between meals & bedtime. What changed: when to take this   hydrALAZINE 50 MG tablet Commonly known as: APRESOLINE Take 1 tablet (50 mg total) by mouth every 8 (eight) hours.   Iron (Ferrous Sulfate) 325 (65 Fe) MG Tabs Take 325 mg by mouth daily.   levothyroxine 50 MCG tablet Commonly known as: SYNTHROID Take 1 tablet (50 mcg total) by mouth daily.   losartan 100 MG tablet Commonly known as: COZAAR Take 1 tablet (100 mg total) by mouth daily. Start taking on: June 07, 2023 What changed:  medication strength how much to take   predniSONE 5 MG tablet Commonly known as: DELTASONE Take 1 tablet (5 mg total) by mouth daily with breakfast.   traMADol 50 MG tablet Commonly known as: ULTRAM Take 1 tablet (50 mg total) by mouth every 8 (eight) hours as needed.   VITAMIN D PO Take 1 tablet by mouth daily.   VITAMIN E  PO Take 1 tablet by mouth daily.               Durable Medical Equipment  (From admission, onward)           Start     Ordered   06/02/23 1404  For home use only DME Bedside commode  Once       Comments: Bariatric bedside commode  Question:  Patient needs a bedside commode to treat with the following condition  Answer:  Balance problem   06/02/23 1403            Follow-up Information     Donita Brooks, MD Follow up in 1 week(s).   Specialty: Family Medicine Contact information: [address] [place] [address] [phone]         Donita Brooks, MD Follow up in 1 week(s).   Specialty: Family Medicine Contact information: [address] [place] [address] [phone]  Allergies  Allergen Reactions   Lisinopril     FACIAL SWELLING    Consultations: None   Procedures/Studies: DG Chest Port 1 View  Result Date: 05/31/2023 CLINICAL DATA:  Bilateral lower extremity weakness. Shortness of breath. EXAM: PORTABLE CHEST 1 VIEW COMPARISON:  05/18/2023 FINDINGS: Enlarged cardiac silhouette that could due to pericardial fluid and or cardiomegaly. Lordotic positioning. The lungs are clear. The vascularity is normal. No effusions. IMPRESSION: Enlarged cardiac silhouette that could be due to pericardial fluid and or cardiomegaly. No active disease otherwise. Electronically Signed   By: Paulina Fusi M.D.   On: 05/31/2023 18:15   DG Chest Port 1 View  Result Date: 05/18/2023 CLINICAL DATA:  Shortness of breath.  History of prostate cancer. EXAM: PORTABLE CHEST 1 VIEW COMPARISON:  X-ray 10/24/2020.  PET-CT 01/13/2023. FINDINGS: No consolidation, pneumothorax or effusion. No edema. Mild bronchial wall thickening. Slight left basilar atelectasis. Films are under penetrated. Overlapping cardiac leads. Tortuous aorta. Degenerative changes of the spine. Advanced degenerative changes of the left shoulder at the edge of the  imaging field. IMPRESSION: Mild left basilar atelectasis. Mild bronchial wall thickening. No consolidation. Under penetrated radiograph Electronically Signed   By: Karen Kays M.D.   On: 05/18/2023 15:00     Discharge Exam: Vitals:   06/06/23 0607 06/06/23 1303  BP: (!) 158/81 130/74  Pulse: 68 (!) 57  Resp: 17 20  Temp: 98.4 F (36.9 C) 97.9 F (36.6 C)  SpO2: 98% 98%   Vitals:   06/05/23 2133 06/06/23 0607 06/06/23 0626 06/06/23 1303  BP: (!) 157/75 (!) 158/81  130/74  Pulse: 65 68  (!) 57  Resp: 17 17  20   Temp: 98 F (36.7 C) 98.4 F (36.9 C)  97.9 F (36.6 C)  TempSrc:  Oral  Oral  SpO2: 95% 98%  98%  Weight:   (!) 138.4 kg   Height:        General: Pt is alert, awake, not in acute distress Cardiovascular: RRR, S1/S2 +, no rubs, no gallops Respiratory: CTA bilaterally, no wheezing, no rhonchi Abdominal: Soft, NT, ND, bowel sounds + Extremities: no edema, no cyanosis    The results of significant diagnostics from this hospitalization (including imaging, microbiology, ancillary and laboratory) are listed below for reference.     Microbiology: Recent Results (from the past 240 hour(s))  Urine Culture (for pregnant, neutropenic or urologic patients or patients with an indwelling urinary catheter)     Status: Abnormal   Collection Time: 05/31/23  6:12 PM   Specimen: Urine, Clean Catch  Result Value Ref Range Status   Specimen Description   Final    URINE, CLEAN CATCH Performed at [facility], [address], [place], [place] [address]    Special Requests   Final    NONE Performed at [facility], [address] [place], [place] [address]    Culture 30,000 COLONIES/mL ENTEROCOCCUS FAECALIS (A)  Final   Report Status 06/03/2023 FINAL  Final   Organism ID, Bacteria ENTEROCOCCUS FAECALIS (A)  Final      Susceptibility   Enterococcus faecalis - MIC*    AMPICILLIN <=2 SENSITIVE Sensitive     NITROFURANTOIN <=16 SENSITIVE  Sensitive     VANCOMYCIN 1 SENSITIVE Sensitive     * 30,000 COLONIES/mL ENTEROCOCCUS FAECALIS     Labs: BNP (last 3 results) Recent Labs    05/18/23 1442 05/31/23 1730  BNP 92.1 66.8   Basic Metabolic Panel: Recent Labs  Lab 05/31/23 2055 06/01/23  1358 06/02/23 0821 06/03/23 0844 06/04/23 0952 06/05/23 0551  NA 142  --  138 139 135 140  K 3.7  --  3.2* 3.3* 3.8 3.7  CL 107  --  107 108 108 109  CO2 27  --  24 23 22 24   GLUCOSE 97  --  124* 109* 142* 94  BUN 23  --  19 19 23  26*  CREATININE 1.41* 1.30* 1.24 1.30* 1.33* 1.45*  CALCIUM 9.5  --  8.8* 8.9 8.7* 9.1  MG 2.0  --   --   --   --   --    Liver Function Tests: Recent Labs  Lab 05/31/23 2055  AST 14*  ALT 12  ALKPHOS 60  BILITOT 0.6  PROT 7.5  ALBUMIN 3.7   No results for input(s): "LIPASE", "AMYLASE" in the last 168 hours. No results for input(s): "AMMONIA" in the last 168 hours. CBC: Recent Labs  Lab 05/31/23 1713 06/01/23 1358 06/02/23 0821  WBC 8.9 6.4 8.4  NEUTROABS 7.0  --  6.5  HGB 11.6* 12.0* 11.9*  HCT 37.4* 40.5 39.1  MCV 78.6* 81.5 80.3  PLT 191 186 191   Cardiac Enzymes: No results for input(s): "CKTOTAL", "CKMB", "CKMBINDEX", "TROPONINI" in the last 168 hours. BNP: Invalid input(s): "POCBNP" CBG: No results for input(s): "GLUCAP" in the last 168 hours. D-Dimer No results for input(s): "DDIMER" in the last 72 hours. Hgb A1c No results for input(s): "HGBA1C" in the last 72 hours. Lipid Profile No results for input(s): "CHOL", "HDL", "LDLCALC", "TRIG", "CHOLHDL", "LDLDIRECT" in the last 72 hours. Thyroid function studies No results for input(s): "TSH", "T4TOTAL", "T3FREE", "THYROIDAB" in the last 72 hours.  Invalid input(s): "FREET3" Anemia work up No results for input(s): "VITAMINB12", "FOLATE", "FERRITIN", "TIBC", "IRON", "RETICCTPCT" in the last 72 hours. Urinalysis    Component Value Date/Time   COLORURINE YELLOW 05/31/2023 1812   APPEARANCEUR HAZY (A) 05/31/2023 1812    APPEARANCEUR Cloudy (A) 08/21/2020 1530   LABSPEC 1.013 05/31/2023 1812   PHURINE 6.5 05/31/2023 1812   GLUCOSEU NEGATIVE 05/31/2023 1812   HGBUR LARGE (A) 05/31/2023 1812   BILIRUBINUR NEGATIVE 05/31/2023 1812   BILIRUBINUR Negative 08/21/2020 1530   KETONESUR NEGATIVE 05/31/2023 1812   PROTEINUR TRACE (A) 05/31/2023 1812   NITRITE NEGATIVE 05/31/2023 1812   LEUKOCYTESUR LARGE (A) 05/31/2023 1812   Sepsis Labs Recent Labs  Lab 05/31/23 1713 06/01/23 1358 06/02/23 0821  WBC 8.9 6.4 8.4   Microbiology Recent Results (from the past 240 hour(s))  Urine Culture (for pregnant, neutropenic or urologic patients or patients with an indwelling urinary catheter)     Status: Abnormal   Collection Time: 05/31/23  6:12 PM   Specimen: Urine, Clean Catch  Result Value Ref Range Status   Specimen Description   Final    URINE, CLEAN CATCH Performed at [facility], [address], [place], [place] [address]    Special Requests   Final    NONE Performed at [facility], [address] [place], [place] [address]    Culture 30,000 COLONIES/mL ENTEROCOCCUS FAECALIS (A)  Final   Report Status 06/03/2023 FINAL  Final   Organism ID, Bacteria ENTEROCOCCUS FAECALIS (A)  Final      Susceptibility   Enterococcus faecalis - MIC*    AMPICILLIN <=2 SENSITIVE Sensitive     NITROFURANTOIN <=16 SENSITIVE Sensitive     VANCOMYCIN 1 SENSITIVE Sensitive     * 30,000 COLONIES/mL ENTEROCOCCUS FAECALIS    FURTHER DISCHARGE INSTRUCTIONS:  Get Medicines reviewed and adjusted: Please take all your medications with you for your next visit with your Primary MD   Laboratory/radiological data: Please request your Primary MD to go over all hospital tests and procedure/radiological results at the follow up, please ask your Primary MD to get all Hospital records sent to his/her office.   In some cases, they will be blood work, cultures and biopsy results pending at the  time of your discharge. Please request that your primary care M.D. goes through all the records of your hospital data and follows up on these results.   Also Note the following: If you experience worsening of your admission symptoms, develop shortness of breath, life threatening emergency, suicidal or homicidal thoughts you must seek medical attention immediately by calling 911 or calling your MD immediately  if symptoms less severe.   You must read complete instructions/literature along with all the possible adverse reactions/side effects for all the Medicines you take and that have been prescribed to you. Take any new Medicines after you have completely understood and accpet all the possible adverse reactions/side effects.    Do not drive when taking Pain medications or sleeping medications (Benzodaizepines)   Do not take more than prescribed Pain, Sleep and Anxiety Medications. It is not advisable to combine anxiety,sleep and pain medications without talking with your primary care practitioner   Special Instructions: If you have smoked or chewed Tobacco  in the last 2 yrs please stop smoking, stop any regular Alcohol  and or any Recreational drug use.   Wear Seat belts while driving.   Please note: You were cared for by a hospitalist during your hospital stay. Once you are discharged, your primary care physician will handle any further medical issues. Please note that NO REFILLS for any discharge medications will be authorized once you are discharged, as it is imperative that you return to your primary care physician (or establish a relationship with a primary care physician if you do not have one) for your post hospital discharge needs so that they can reassess your need for medications and monitor your lab values  Time coordinating discharge: Over 30 minutes  SIGNED:   Hughie Closs, MD  [facility] 06/06/2023, 2:14 PM *Please note that this is a verbal dictation therefore any  spelling or grammatical errors are due to the "[employer]" system interpretation. If 7PM-7AM, please contact night-coverage [email or web address]

## 2023-06-06 NOTE — Progress Notes (Signed)
PROGRESS NOTE    Terry [name]  RUE:[id number] DOB: 1941-05-09 DOA: 05/31/2023 PCP: Donita Brooks, MD   Brief Narrative:  HPI: This is a 82 year old male with past medical history of recurrent urinary tract infections , T2DM, HTN, hypothyroidism, paroxysmal atrial fibrillation, HFpEF,  chronic kidney disease stage III, metastatic prostate cancer, OHS, morbid obesity,  and deconditioning.  Patient has been complaining of losing strength the last 3 to 4 days.  At baseline the patient uses a wheelchair, walker, or a 4-prong seated walker depending on where he is.  In the home he uses a walker.  He found himself having difficulty walking around the home with his walker.  This was first noted 2 weeks ago but has gotten worse over the past 3 to 4 days.  He endorses burning urination for the last 2 weeks and chills.  He denies shortness of breath, cough, fever, nausea, vomiting or confusion.  Today while sitting on his 4-prong seated walker he found himself slipping onto the floor because of his weakness.  His family had him take him to the ER.   In the ER urinalysis is highly suggestive of acute cystitis.  Admission requested  Assessment & Plan:   Principal Problem:   Generalized weakness Active Problems:   CKD (chronic kidney disease) stage 3, GFR 30-59 ml/min (HCC)   Depression with anxiety   Essential hypertension   Morbid obesity due to excess calories (HCC)   CHF, chronic (HCC)   Prostate cancer (HCC)   Complicated UTI (urinary tract infection)   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)   Benign prostatic hyperplasia with lower urinary tract symptoms   Acute cystitis  Acute UTI/cystitis: Urine culture Enterococcus.  Switching antibiotics to amoxicillin.  Generalized weakness: Has chronic debility and deconditioning, typically wheelchair and sometimes walker bound.  Seen by PT OT and initially they recommended home with home health.  Family unhappy and  resistant for discharge on 06/02/2023.  Please see my note separately on that day.  Per family request, discharge was canceled and per their request, patient was reassessed by PT 06/03/2023 and they recommend SNF.  TOC on board.  Metastatic prostate cancer: Xgeva injection every 3 months, Zytiga daily, Eligard every 6 months. Continue daily prednisone  Chronic heart failure with preserved ejection fraction: Appears euvolemic.  Continue home medications which include Lasix, Coreg and losartan.  Acquired hypothyroidism: Continue Synthroid.  CKD stage IIIa: Stable.  Hypokalemia: will replace.   Essential hypertension: Patient already on Coreg maximized.  25 mg p.o. twice daily.  Increased losartan to 100 mg 06/02/2023 and added hydralazine 25 mg p.o. 3 times daily and increased to 50 mg p.o. 3 times daily on 06/03/2023.  Blood pressure finally is much better controlled.  Continue current regimen.  Paroxysmal atrial fibrillation: Rate controlled.  Continue Coreg and Eliquis.  Morbid obesity: Weight loss and dietary modification counseled.  DVT prophylaxis: apixaban (ELIQUIS) tablet 2.5 mg Start: 06/02/23 1000   Code Status: Prior  Family Communication: None present at bedside.  Plan of care discussed with patient in length and he/she verbalized understanding and agreed with it.  Status is: Inpatient Remains inpatient appropriate because: Medically stable, waiting for placement to SNF.  Estimated body mass index is 47.79 kg/m as calculated from the following:   Height as of this encounter: 5\' 7"  (1.702 m).   Weight as of this encounter: 138.4 kg.    Nutritional Assessment: Body mass index is 47.79 kg/m.Marland Kitchen Seen by dietician.  I agree with the assessment and plan as outlined below: Nutrition Status:        . Skin Assessment: I have examined the patient's skin and I agree with the wound assessment as performed by the wound care RN as outlined below:    Consultants:   None  Procedures:  None  Antimicrobials:  Anti-infectives (From admission, onward)    Start     Dose/Rate Route Frequency Ordered Stop   06/03/23 1200  amoxicillin (AMOXIL) capsule 500 mg        500 mg Oral 3 times daily 06/03/23 1111     06/02/23 0000  amoxicillin (AMOXIL) 500 MG capsule        500 mg Oral 3 times daily 06/02/23 1223 06/09/23 2359   06/01/23 2200  cefTRIAXone (ROCEPHIN) 1 g in sodium chloride 0.9 % 100 mL IVPB  Status:  Discontinued        1 g 200 mL/hr over 30 Minutes Intravenous Every 24 hours 06/01/23 0716 06/03/23 1111   05/31/23 2245  cefTRIAXone (ROCEPHIN) 1 g in sodium chloride 0.9 % 100 mL IVPB        1 g 200 mL/hr over 30 Minutes Intravenous  Once 05/31/23 2241 06/01/23 0106         Subjective: Patient seen and examined.  He has no complaints.  Objective: Vitals:   06/05/23 2133 06/06/23 0607 06/06/23 0626 06/06/23 1303  BP: (!) 157/75 (!) 158/81  130/74  Pulse: 65 68  (!) 57  Resp: 17 17  20   Temp: 98 F (36.7 C) 98.4 F (36.9 C)  97.9 F (36.6 C)  TempSrc:  Oral  Oral  SpO2: 95% 98%  98%  Weight:   (!) 138.4 kg   Height:        Intake/Output Summary (Last 24 hours) at 06/06/2023 1408 Last data filed at 06/06/2023 8413 Gross per 24 hour  Intake 240 ml  Output 625 ml  Net -385 ml   Filed Weights   06/01/23 0321 06/03/23 0605 06/06/23 0626  Weight: (!) 140.4 kg (!) 137.4 kg (!) 138.4 kg    Examination:  General exam: Appears calm and comfortable, morbidly obese Respiratory system: Clear to auscultation. Respiratory effort normal. Cardiovascular system: S1 & S2 heard, RRR. No JVD, murmurs, rubs, gallops or clicks. No pedal edema. Gastrointestinal system: Abdomen is nondistended, soft and nontender. No organomegaly or masses felt. Normal bowel sounds heard. Central nervous system: Alert and oriented. No focal neurological deficits. Extremities: Symmetric 5 x 5 power. Skin: No rashes, lesions or ulcers.  Psychiatry: Judgement and  insight appear normal. Mood & affect appropriate.    Data Reviewed: I have personally reviewed following labs and imaging studies  CBC: Recent Labs  Lab 05/31/23 1713 06/01/23 1358 06/02/23 0821  WBC 8.9 6.4 8.4  NEUTROABS 7.0  --  6.5  HGB 11.6* 12.0* 11.9*  HCT 37.4* 40.5 39.1  MCV 78.6* 81.5 80.3  PLT 191 186 191   Basic Metabolic Panel: Recent Labs  Lab 05/31/23 2055 06/01/23 1358 06/02/23 0821 06/03/23 0844 06/04/23 0952 06/05/23 0551  NA 142  --  138 139 135 140  K 3.7  --  3.2* 3.3* 3.8 3.7  CL 107  --  107 108 108 109  CO2 27  --  24 23 22 24   GLUCOSE 97  --  124* 109* 142* 94  BUN 23  --  19 19 23  26*  CREATININE 1.41* 1.30* 1.24 1.30* 1.33* 1.45*  CALCIUM 9.5  --  8.8* 8.9 8.7* 9.1  MG 2.0  --   --   --   --   --    GFR: Estimated Creatinine Clearance: 52.8 mL/min (A) (by C-G formula based on SCr of 1.45 mg/dL (H)). Liver Function Tests: Recent Labs  Lab 05/31/23 2055  AST 14*  ALT 12  ALKPHOS 60  BILITOT 0.6  PROT 7.5  ALBUMIN 3.7   No results for input(s): "LIPASE", "AMYLASE" in the last 168 hours. No results for input(s): "AMMONIA" in the last 168 hours. Coagulation Profile: No results for input(s): "INR", "PROTIME" in the last 168 hours. Cardiac Enzymes: No results for input(s): "CKTOTAL", "CKMB", "CKMBINDEX", "TROPONINI" in the last 168 hours. BNP (last 3 results) No results for input(s): "PROBNP" in the last 8760 hours. HbA1C: No results for input(s): "HGBA1C" in the last 72 hours. CBG: No results for input(s): "GLUCAP" in the last 168 hours. Lipid Profile: No results for input(s): "CHOL", "HDL", "LDLCALC", "TRIG", "CHOLHDL", "LDLDIRECT" in the last 72 hours. Thyroid Function Tests: No results for input(s): "TSH", "T4TOTAL", "FREET4", "T3FREE", "THYROIDAB" in the last 72 hours. Anemia Panel: No results for input(s): "VITAMINB12", "FOLATE", "FERRITIN", "TIBC", "IRON", "RETICCTPCT" in the last 72 hours. Sepsis Labs: No results for  input(s): "PROCALCITON", "LATICACIDVEN" in the last 168 hours.  Recent Results (from the past 240 hour(s))  Urine Culture (for pregnant, neutropenic or urologic patients or patients with an indwelling urinary catheter)     Status: Abnormal   Collection Time: 05/31/23  6:12 PM   Specimen: Urine, Clean Catch  Result Value Ref Range Status   Specimen Description   Final    URINE, CLEAN CATCH Performed at [facility], [address] [place], [place] [address]    Special Requests   Final    NONE Performed at [facility], [address] [place], [place] [address]    Culture 30,000 COLONIES/mL ENTEROCOCCUS FAECALIS (A)  Final   Report Status 06/03/2023 FINAL  Final   Organism ID, Bacteria ENTEROCOCCUS FAECALIS (A)  Final      Susceptibility   Enterococcus faecalis - MIC*    AMPICILLIN <=2 SENSITIVE Sensitive     NITROFURANTOIN <=16 SENSITIVE Sensitive     VANCOMYCIN 1 SENSITIVE Sensitive     * 30,000 COLONIES/mL ENTEROCOCCUS FAECALIS     Radiology Studies: No results found.  Scheduled Meds:  allopurinol  50 mg Oral Daily   amoxicillin  500 mg Oral TID   apixaban  2.5 mg Oral BID   carvedilol  25 mg Oral BID   hydrALAZINE  50 mg Oral Q8H   levothyroxine  50 mcg Oral Q0600   losartan  100 mg Oral Daily   polyethylene glycol  17 g Oral Daily   predniSONE  5 mg Oral Q breakfast   Continuous Infusions:     LOS: 5 days   Hughie Closs, MD [facility]  06/06/2023, 2:08 PM   *Please note that this is a verbal dictation therefore any spelling or grammatical errors are due to the "Dragon Medical One" system interpretation.  Please page via Amion and do not message via secure chat for urgent patient care matters. Secure chat can be used for non urgent patient care matters.  How to contact the [facility] Attending or Consulting provider 7A - 7P or covering provider during after hours 7P -7A, for this patient?  Check the care team in [facility]  and look for a) attending/consulting TRH provider listed and b) the [facility] team listed. Page or  secure chat 7A-7P. Log into [email or web address] and use [facility]'s universal password to access. If you do not have the password, please contact the hospital operator. Locate the [facility] provider you are looking for under [facility] and page to a number that you can be directly reached. If you still have difficulty reaching the provider, please page the [facility] (Director on Call) for the Hospitalists listed on amion for assistance.

## 2023-06-06 NOTE — TOC Transition Note (Signed)
Transition of Care [facility]) - CM/SW Discharge Note   Patient Details  Name: Terry [name] MRN: [id number] Date of Birth: 06/25/1941  Transition of Care [facility]) CM/SW Contact:  Otelia Santee, LCSW Phone Number: 06/06/2023, 2:44 PM   Clinical Narrative:    Pt's insurance auth approved. Pt able to transfer to [facility] for SNF. Pt will be going to room [phone]. Pt and daughter agreeable to transfer. PTAR called at 2:45pm for transportation.     Final next level of care: Skilled Nursing Facility Barriers to Discharge: Barriers Resolved   Patient Goals and CMS Choice CMS Medicare.gov Compare Post Acute Care list provided to:: Patient Choice offered to / list presented to : Patient, Adult Children  Discharge Placement PASRR number recieved: 06/03/23 PASRR number recieved: 06/03/23            Patient chooses bed at: [facility] Patient to be transferred to facility by: PTAR Name of family member notified: Pt and daughter Patient and family notified of of transfer: 06/06/23  Discharge Plan and Services Additional resources added to the After Visit Summary for   In-house Referral: Clinical Social Work Discharge Planning Services: NA Post Acute Care Choice: Skilled Nursing Facility          DME Arranged: Bedside commode DME Agency: [employer] Date DME Agency Contacted: 06/02/23 Time DME Agency Contacted: [phone] Representative spoke with at DME Agency: Zack HH Arranged: RN, PT, OT HH Agency: [facility] Date [facility] Agency Contacted: 06/02/23 Time HH Agency Contacted: [phone] Representative spoke with at [facility] Agency: Cindie  Social Determinants of Health (SDOH) Interventions SDOH Screenings   Food Insecurity: Food Insecurity Present (06/01/2023)  Housing: Medium Risk (06/01/2023)  Transportation Needs: No Transportation Needs (06/01/2023)  Utilities: Not At Risk (06/01/2023)  Alcohol Screen: Low Risk  (06/09/2022)  Depression (PHQ2-9): Low Risk  (01/14/2023)   Financial Resource Strain: Low Risk  (12/17/2022)  Physical Activity: Insufficiently Active (12/17/2022)  Social Connections: Socially Isolated (12/17/2022)  Stress: No Stress Concern Present (12/17/2022)  Tobacco Use: Low Risk  (05/31/2023)     Readmission Risk Interventions    06/02/2023    3:52 PM  Readmission Risk Prevention Plan  Transportation Screening Complete  PCP or Specialist Appt within 5-7 Days Complete  Home Care Screening Complete  Medication Review (RN CM) Complete

## 2023-06-06 NOTE — Progress Notes (Signed)
Escorted off unit via ptar no complaints noted

## 2023-06-06 NOTE — Progress Notes (Signed)
Report is given to  RN Tilman Neat form [employer].

## 2023-06-06 NOTE — Plan of Care (Signed)
?  Problem: Education: ?Goal: Knowledge of General Education information will improve ?Description: Including pain rating scale, medication(s)/side effects and non-pharmacologic comfort measures ?Outcome: Progressing ?  ?Problem: Health Behavior/Discharge Planning: ?Goal: Ability to manage health-related needs will improve ?Outcome: Progressing ?  ?Problem: Clinical Measurements: ?Goal: Ability to maintain clinical measurements within normal limits will improve ?Outcome: Progressing ?Goal: Will remain free from infection ?Outcome: Progressing ?Goal: Diagnostic test results will improve ?Outcome: Progressing ?  ?Problem: Activity: ?Goal: Risk for activity intolerance will decrease ?Outcome: Progressing ?  ?Problem: Nutrition: ?Goal: Adequate nutrition will be maintained ?Outcome: Progressing ?  ?Problem: Elimination: ?Goal: Will not experience complications related to bowel motility ?Outcome: Progressing ?Goal: Will not experience complications related to urinary retention ?Outcome: Progressing ?  ?Problem: Safety: ?Goal: Ability to remain free from injury will improve ?Outcome: Progressing ?  ?Problem: Skin Integrity: ?Goal: Risk for impaired skin integrity will decrease ?Outcome: Progressing ?  ?

## 2023-06-06 NOTE — Progress Notes (Signed)
Physical Therapy Treatment Patient Details Name: Terry [name] MRN: [id number] DOB: [birth year] Today's Date: 06/06/2023   History of Present Illness Mr. Anstead is a 82 yr old male admitted to the hospital 05-31-23 with weakness, chills, and burning while urinating. PMH: recurrent UTI, DM II, HTN, a fib, HF, CKD 3, metastatic prostate CA, OHS, arthritis, lymphedema    PT Comments  General Comments: Oriented x4, friendly, motivated Lives home with Son. Pt was OOB in recliner.  Has amb to bathroom earlier with staff.  Needed a rest break.  Assisted with amb in hallway while monitoring sats.  General transfer comment: cues for proper UE placement, power up and extension posture with forward momentum weight shift.  General Gait Details: limited amb distance 40 feet x 2 needing one seated rest break due to 3/4 dyspnea.  Avg RA 96%.  Limited endurance. Pt will need ST Rehab at SNF to address mobility and functional decline prior to safely returning home.     Assistance Recommended at Discharge Intermittent Supervision/Assistance  If plan is discharge home, recommend the following:  Can travel by private vehicle    A little help with walking and/or transfers;Assistance with cooking/housework;Direct supervision/assist for medications management;Assist for transportation;Help with stairs or ramp for entrance;A little help with bathing/dressing/bathroom   Yes  Equipment Recommendations  None recommended by PT    Recommendations for Other Services       Precautions / Restrictions Restrictions Weight Bearing Restrictions: No     Mobility  Bed Mobility               General bed mobility comments: OOB in recliner    Transfers Overall transfer level: Needs assistance Equipment used: Rolling walker (2 wheels) (Bariatric) Transfers: Sit to/from Stand Sit to Stand: Min guard, Min assist           General transfer comment: cues for proper UE placement, power up and extension posture  with forward momentum weight shift    Ambulation/Gait Ambulation/Gait assistance: Min guard Gait Distance (Feet): 80 Feet (40 feet x 2) Assistive device: Rolling walker (2 wheels) Gait Pattern/deviations: Decreased stride length, Trunk flexed Gait velocity: decreaed     General Gait Details: limited amb distance 40 feet x 2 needing one seated rest break due to 3/4 dyspnea.  Avg RA 96%.  Limited endurance.   Stairs             Wheelchair Mobility     Tilt Bed    Modified Rankin (Stroke Patients Only)       Balance                                            Cognition Arousal/Alertness: Awake/alert Behavior During Therapy: WFL for tasks assessed/performed                                   General Comments: Oriented x4, friendly, motivated Lives home with Son.        Exercises      General Comments        Pertinent Vitals/Pain      Home Living                          Prior Function  PT Goals (current goals can now be found in the care plan section) Progress towards PT goals: Progressing toward goals    Frequency    Min 1X/week      PT Plan Current plan remains appropriate    Co-evaluation              AM-PAC PT "6 Clicks" Mobility   Outcome Measure  Help needed turning from your back to your side while in a flat bed without using bedrails?: A Little Help needed moving from lying on your back to sitting on the side of a flat bed without using bedrails?: A Little Help needed moving to and from a bed to a chair (including a wheelchair)?: A Little Help needed standing up from a chair using your arms (e.g., wheelchair or bedside chair)?: A Little Help needed to walk in hospital room?: A Little Help needed climbing 3-5 steps with a railing? : A Lot 6 Click Score: 17    End of Session Equipment Utilized During Treatment: Gait belt Activity Tolerance: Patient tolerated treatment  well Patient left: with call bell/phone within reach;in chair;with chair alarm set Nurse Communication: Mobility status PT Visit Diagnosis: Muscle weakness (generalized) (M62.81);Difficulty in walking, not elsewhere classified (R26.2)     Time: 1610-9604 PT Time Calculation (min) (ACUTE ONLY): 19 min  Charges:    $Gait Training: 8-22 mins PT General Charges $$ ACUTE PT VISIT: 1 Visit                     Felecia Shelling  PTA Acute  Rehabilitation Services Office M-F          [phone]

## 2023-06-06 NOTE — Progress Notes (Signed)
Occupational Therapy Treatment Patient Details Name: Terry [name] MRN: [id number] DOB: 08/10/1941 Today's Date: 06/06/2023   History of present illness Terry [name] is a 82 yr old male admitted to the hospital 05-31-23 with weakness, chills, and burning while urinating. PMH: recurrent UTI, DM II, HTN, a fib, HF, CKD 3, metastatic prostate CA, OHS, arthritis, lymphedema   OT comments  Pt endorses low activity tolerance for some time prior to admission. Reports significant difficulty and fatigue with LB bathing and dressing. Pt owns a Sports administrator. Recommended pt consider sock aid (wide), long handled bath sponge and instructed in use of his reacher to doff socks, start pants over feet and to dry feet. Pt is eager to go to rehab and regain his strength. Pt reports walking to bathroom with nursing earlier today. Prefers to remain up in chair.    Recommendations for follow up therapy are one component of a multi-disciplinary discharge planning process, led by the attending physician.  Recommendations may be updated based on patient status, additional functional criteria and insurance authorization.    Assistance Recommended at Discharge Frequent or constant Supervision/Assistance  Patient can return home with the following  Assist for transportation;Assistance with cooking/housework;A lot of help with bathing/dressing/bathroom;A little help with walking and/or transfers   Equipment Recommendations  BSC/3in1 (bariatric)    Recommendations for Other Services      Precautions / Restrictions Precautions Precautions: Fall Restrictions Weight Bearing Restrictions: No       Mobility Bed Mobility               General bed mobility comments: OOB in recliner    Transfers Overall transfer level: Needs assistance Equipment used: Rolling walker (2 wheels) Transfers: Sit to/from Stand Sit to Stand: Min guard                 Balance Overall balance assessment: Needs assistance, History of  Falls Sitting-balance support: Feet supported Sitting balance-Leahy Scale: Good       Standing balance-Leahy Scale: Poor                             ADL either performed or assessed with clinical judgement   ADL                                         General ADL Comments: Pt reports walking with nursing to bathroom and having already had a "wash up." Educated pt in energy conservation strategies with LB bathing and dressing and benefits of AE. Pt already has a Sports administrator. He sits to shower.    Extremity/Trunk Assessment              Vision       Perception     Praxis      Cognition Arousal/Alertness: Awake/alert Behavior During Therapy: WFL for tasks assessed/performed Overall Cognitive Status: Within Functional Limits for tasks assessed                                          Exercises      Shoulder Instructions       General Comments      Pertinent Vitals/ Pain       Pain Assessment Pain Assessment: No/denies pain  Home Living  Prior Functioning/Environment              Frequency  Min 1X/week        Progress Toward Goals  OT Goals(current goals can now be found in the care plan section)  Progress towards OT goals: Progressing toward goals  Acute Rehab OT Goals OT Goal Formulation: With patient Time For Goal Achievement: 06/16/23 Potential to Achieve Goals: Good ADL Goals Pt Will Perform Upper Body Dressing: with modified independence;sitting Pt Will Perform Lower Body Dressing: with supervision;sit to/from stand Pt Will Transfer to Toilet: with supervision;ambulating Pt Will Perform Toileting - Clothing Manipulation and hygiene: with supervision;sit to/from stand  Plan Discharge plan remains appropriate    Co-evaluation                 AM-PAC OT "6 Clicks" Daily Activity     Outcome Measure   Help from another person  eating meals?: None Help from another person taking care of personal grooming?: None Help from another person toileting, which includes using toliet, bedpan, or urinal?: A Lot Help from another person bathing (including washing, rinsing, drying)?: A Lot Help from another person to put on and taking off regular upper body clothing?: A Little Help from another person to put on and taking off regular lower body clothing?: A Lot 6 Click Score: 17    End of Session    OT Visit Diagnosis: Unsteadiness on feet (R26.81);History of falling (Z91.81);Muscle weakness (generalized) (M62.81)   Activity Tolerance Patient tolerated treatment well   Patient Left in chair;with call bell/phone within reach;with chair alarm set;with family/visitor present   Nurse Communication          Time: 8413-2440 OT Time Calculation (min): 20 min  Charges: OT General Charges $OT Visit: 1 Visit OT Treatments $Self Care/Home Management : 8-22 mins  Berna Spare, OTR/L Acute Rehabilitation Services Office: [phone]  Evern Bio 06/06/2023, 2:17 PM

## 2023-06-06 NOTE — Progress Notes (Signed)
Chaplain engaged in a follow-up visit with Terry [name]. 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   Terry [name] was able to detail his hunt for a faith community. Chaplain provided some helpful tools in finding a faith community based on Terry [name]'s desires to learn about economics, business, and be connected to a spiritual community that gives back. Chaplain offered support, community, and presence.   Chaplain continues to recommend that Terry [name] obtain therapeutic resources and support.    06/06/23 1100  Spiritual Encounters  Type of Visit Follow up  Reason for visit Routine spiritual support

## 2023-06-10 ENCOUNTER — Other Ambulatory Visit (HOSPITAL_COMMUNITY): Payer: Self-pay

## 2023-06-27 ENCOUNTER — Telehealth: Payer: Self-pay

## 2023-06-27 NOTE — Transitions of Care (Post Inpatient/ED Visit) (Signed)
06/27/2023  Name: Terry [name] MRN: [id number] DOB: 1941-05-24  Today's TOC FU Call Status: Today's TOC FU Call Status:: Successful TOC FU Call Completed TOC FU Call Complete Date: 06/27/23  Transition Care Management Follow-up Telephone Call Date of Discharge: 06/24/23 Discharge Facility: Other ([facility]) Name of Other ([facility]) Discharge Facility: [facility] Type of Discharge: Inpatient Admission Reason for ED Visit: Other: (UTI) How have you been since you were released from the hospital?: Better Any questions or concerns?: No  Items Reviewed: Did you receive and understand the discharge instructions provided?: Yes Medications obtained,verified, and reconciled?: Yes (Medications Reviewed) Any new allergies since your discharge?: No Dietary orders reviewed?: Yes Do you have support at home?: Yes People in Home: child(ren), adult  Medications Reviewed Today: Medications Reviewed Today     Reviewed by Karena Addison, LPN (Licensed Practical Nurse) on 06/27/23 at 1007  Med List Status: <None>   Medication Order Taking? Sig Documenting Provider Last Dose Status Informant  abiraterone acetate (ZYTIGA) 250 MG tablet [id number] No Take 4 tablets (1,000 mg total) by mouth daily. Take on an empty stomach 1 hour before or 2 hours after a meal  Patient taking differently: Take 500 mg by mouth 2 (two) times daily. Take on an empty stomach 1 hour before or 2 hours after a meal   Malachy Mood, MD 05/31/2023 Active Child, Pharmacy Records           Med Note Remonia Richter Jun 01, 2023  1:00 PM) Dispense records do not support compliance.   allopurinol (ZYLOPRIM) 100 MG tablet [id number] No Take 0.5 tablets (50 mg total) by mouth daily. Donita Brooks, MD 05/31/2023 Active Child, Pharmacy Records  apixaban [facility]) 2.5 MG TABS tablet [id number] No Take 1 tablet (2.5 mg total) by mouth 2 (two) times daily. Donita Brooks, MD 05/31/2023 Active Child, Pharmacy Records   carvedilol (COREG) 25 MG tablet [id number] No Take 25 mg by mouth 2 (two) times daily. [provider] 05/31/2023 Active Child, Pharmacy Records  gabapentin (NEURONTIN) 300 MG capsule [id number] No Take 1 capsule (300 mg total) by mouth 3 times/day as needed-between meals & bedtime.  Patient taking differently: Take 300 mg by mouth 3 (three) times daily.   Donita Brooks, MD 05/31/2023 Active Child, Pharmacy Records           Med Note Remonia Richter Jun 01, 2023 12:59 PM) Dispense records do not support claim. Daughter states she does put in his pill box TID.   hydrALAZINE (APRESOLINE) 50 MG tablet [id number]  Take 1 tablet (50 mg total) by mouth every 8 (eight) hours. Hughie Closs, MD  Active   Iron, Ferrous Sulfate, 325 (65 Fe) MG TABS [id number] No Take 325 mg by mouth daily. Donita Brooks, MD 05/31/2023 Active Child, Pharmacy Records  levothyroxine (SYNTHROID) 50 MCG tablet [id number] No Take 1 tablet (50 mcg total) by mouth daily. Donita Brooks, MD 05/31/2023 Active Child, Pharmacy Records  losartan (COZAAR) 100 MG tablet [id number]  Take 1 tablet (100 mg total) by mouth daily. Hughie Closs, MD  Active   predniSONE (DELTASONE) 5 MG tablet [id number] No Take 1 tablet (5 mg total) by mouth daily with breakfast. Malachy Mood, MD 05/31/2023 Active Child, Pharmacy Records           Med Note Remonia Richter Jun 01, 2023 12:58 PM) Last dispense in March of 2023. Daughter confirms still taking.   traMADol (  ULTRAM) 50 MG tablet [id number]  Take 1 tablet (50 mg total) by mouth every 8 (eight) hours as needed. Hughie Closs, MD  Active   VITAMIN D PO [id number] No Take 1 tablet by mouth daily. [provider] 05/31/2023 Active Child, Pharmacy Records  VITAMIN E PO [id number] No Take 1 tablet by mouth daily. [provider] 05/31/2023 Active Child, Pharmacy Records  Med List Note Otis Peak, [facility] 01/19/23 1457): Zytiga and Prednisone filled at [facility] and Equipment/Supplies: Were Home Health Services Ordered?: Yes Name of Home Health Agency:: [facility] Has Agency set up a time to come to your home?: Yes First Home Health Visit Date: 06/27/23 Any new equipment or medical supplies ordered?: NA  Functional Questionnaire: Do you need assistance with bathing/showering or dressing?: No Do you need assistance with meal preparation?: Yes Do you need assistance with eating?: No Do you have difficulty maintaining continence: No Do you need assistance with getting out of bed/getting out of a chair/moving?: No Do you have difficulty managing or taking your medications?: No  Follow up appointments reviewed: PCP Follow-up appointment confirmed?: Yes Date of PCP follow-up appointment?: 07/01/23 Follow-up Provider: [facility] Follow-up appointment confirmed?: NA Do you need transportation to your follow-up appointment?: No Do you understand care options if your condition(s) worsen?: Yes-patient verbalized understanding    SIGNATURE Karena Addison, LPN [facility] Nurse Health Advisor Direct Dial [phone]

## 2023-06-29 ENCOUNTER — Inpatient Hospital Stay: Payer: Medicare Other | Admitting: Hematology

## 2023-06-29 ENCOUNTER — Other Ambulatory Visit: Payer: Medicare Other

## 2023-06-29 ENCOUNTER — Encounter: Payer: Self-pay | Admitting: Hematology

## 2023-06-29 ENCOUNTER — Inpatient Hospital Stay: Payer: Medicare Other | Attending: Hematology

## 2023-06-29 ENCOUNTER — Inpatient Hospital Stay: Payer: Medicare Other

## 2023-06-29 ENCOUNTER — Ambulatory Visit: Payer: Medicare Other

## 2023-06-29 ENCOUNTER — Other Ambulatory Visit: Payer: Self-pay

## 2023-06-29 ENCOUNTER — Ambulatory Visit: Payer: Medicare Other | Admitting: Hematology

## 2023-06-29 VITALS — BP 134/84 | HR 55 | Temp 98.8°F | Resp 18

## 2023-06-29 DIAGNOSIS — C7951 Secondary malignant neoplasm of bone: Secondary | ICD-10-CM | POA: Diagnosis present

## 2023-06-29 DIAGNOSIS — Z5111 Encounter for antineoplastic chemotherapy: Secondary | ICD-10-CM | POA: Insufficient documentation

## 2023-06-29 DIAGNOSIS — C61 Malignant neoplasm of prostate: Secondary | ICD-10-CM

## 2023-06-29 DIAGNOSIS — Z79899 Other long term (current) drug therapy: Secondary | ICD-10-CM | POA: Diagnosis not present

## 2023-06-29 LAB — CBC WITH DIFFERENTIAL (CANCER CENTER ONLY)
Abs Immature Granulocytes: 0.02 10*3/uL (ref 0.00–0.07)
Basophils Absolute: 0 10*3/uL (ref 0.0–0.1)
Basophils Relative: 1 %
Eosinophils Absolute: 0.1 10*3/uL (ref 0.0–0.5)
Eosinophils Relative: 2 %
HCT: 33.5 % — ABNORMAL LOW (ref 39.0–52.0)
Hemoglobin: 10.5 g/dL — ABNORMAL LOW (ref 13.0–17.0)
Immature Granulocytes: 0 %
Lymphocytes Relative: 13 %
Lymphs Abs: 0.8 10*3/uL (ref 0.7–4.0)
MCH: 24.7 pg — ABNORMAL LOW (ref 26.0–34.0)
MCHC: 31.3 g/dL (ref 30.0–36.0)
MCV: 78.8 fL — ABNORMAL LOW (ref 80.0–100.0)
Monocytes Absolute: 0.5 10*3/uL (ref 0.1–1.0)
Monocytes Relative: 9 %
Neutro Abs: 4.5 10*3/uL (ref 1.7–7.7)
Neutrophils Relative %: 75 %
Platelet Count: 174 10*3/uL (ref 150–400)
RBC: 4.25 MIL/uL (ref 4.22–5.81)
RDW: 17.5 % — ABNORMAL HIGH (ref 11.5–15.5)
WBC Count: 5.9 10*3/uL (ref 4.0–10.5)
nRBC: 0 % (ref 0.0–0.2)

## 2023-06-29 LAB — CMP (CANCER CENTER ONLY)
ALT: 9 U/L (ref 0–44)
AST: 13 U/L — ABNORMAL LOW (ref 15–41)
Albumin: 3.5 g/dL (ref 3.5–5.0)
Alkaline Phosphatase: 69 U/L (ref 38–126)
Anion gap: 4 — ABNORMAL LOW (ref 5–15)
BUN: 14 mg/dL (ref 8–23)
CO2: 29 mmol/L (ref 22–32)
Calcium: 9 mg/dL (ref 8.9–10.3)
Chloride: 112 mmol/L — ABNORMAL HIGH (ref 98–111)
Creatinine: 1.2 mg/dL (ref 0.61–1.24)
GFR, Estimated: >60 mL/min (ref >60)
Glucose, Bld: 96 mg/dL (ref 70–99)
Potassium: 3.1 mmol/L — ABNORMAL LOW (ref 3.5–5.1)
Sodium: 145 mmol/L (ref 135–145)
Total Bilirubin: 0.4 mg/dL (ref 0.3–1.2)
Total Protein: 6.9 g/dL (ref 6.5–8.1)

## 2023-06-29 MED ORDER — DENOSUMAB 120 MG/1.7ML ~~LOC~~ SOLN
120.0000 mg | Freq: Once | SUBCUTANEOUS | Status: AC
  Administered 2023-06-29: 120 mg via SUBCUTANEOUS
  Filled 2023-06-29: qty 1.7

## 2023-06-29 MED ORDER — LEUPROLIDE ACETATE (6 MONTH) 45 MG ~~LOC~~ KIT
45.0000 mg | PACK | SUBCUTANEOUS | Status: DC
  Administered 2023-06-29: 45 mg via SUBCUTANEOUS
  Filled 2023-06-29: qty 45

## 2023-06-29 NOTE — Assessment & Plan Note (Signed)
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 He has been treated with ADT, but lost follow-up intermittently due to compliant and transportation issue. -last PSA was 74 on 12/21/2022, which has increased from 32.92 years ago -PSMA PET scan on 01/13/2023 showed widespread bone metastasis, and suspicious lymph node metastasis, in addition to hypermetabolism in prostate. -I recommend continue ADT -He started Zytiga 1000mg  daily/prednisone 5mg  daily in March 2024.   -He unfortunately had a multiple falls at home, and was hospitalized last month, went to rehab, and was recently discharged from rehab.  He is still very weak, not able to walk much, feels unsteady, he is on home physical therapy now. -I think Zytiga probably contributed to his worsening fatigue and balance issue, I will hold Zytiga now for months, and reevaluate him.  If he is able to recover well, we may try low-dose Zytiga 500 mg daily.  He agrees with the plan.

## 2023-06-29 NOTE — Progress Notes (Signed)
[facility]   Telephone:(336) [phone] Fax:([phone]) [phone]   Clinic Follow up Note   Patient Care Team: Donita Brooks, MD as PCP - General (Family Medicine) Antonieta Iba, MD as Consulting Physician (Cardiology) Erroll Luna, [facility] (Inactive) as Pharmacist (Pharmacist) Malachy Mood, MD as Consulting Physician (Oncology)  Date of Service:  06/29/2023  CHIEF COMPLAINT: f/u of metastatic prostate cancer  CURRENT THERAPY:  Eligard every 6 months Zytiga 1000 mg daily and prednisone 5 mg daily, started in March 2024, will hold for now  Xgeva every 2 to 3 months, starting today   ASSESSMENT:  Terry [name] is a 82 y.o. 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 He has been treated with ADT, but lost follow-up intermittently due to compliant and transportation issue. -last PSA was 74 on 12/21/2022, which has increased from 32.92 years ago -PSMA PET scan on 01/13/2023 showed widespread bone metastasis, and suspicious lymph node metastasis, in addition to hypermetabolism in prostate. -I recommend continue ADT -He started Zytiga 1000mg  daily/prednisone 5mg  daily in March 2024.   -He unfortunately had a multiple falls at home, and was hospitalized last month, went to rehab, and was recently discharged from rehab.  He is still very weak, not able to walk much, feels unsteady, he is on home physical therapy now. -I think Zytiga probably contributed to his worsening fatigue and balance issue, I will hold Zytiga now for months, and reevaluate him.  If he is able to recover well, we may try low-dose Zytiga 500 mg daily.  He agrees with the plan.  Bone metastasis -From prostate cancer -I recommend Xgeva every 2 to 3 months, he has no active dental issues, will start today. -Continue calcium and  vitamin D  Fall and deconditioning  -Multifactorial -Will follow-up with his primary care physician also -I recommend him to get a electric wheelchair, he was think about -He has a walker at home.  PLAN: -Lab reviewed, will proceed Eligard and Xgeva today -Due to his worsening fatigue, I will stop Zytiga for now, and reevaluate him on next visit, to see if he can restart at the low-dose -Lab and follow-up in a month   SUMMARY OF ONCOLOGIC HISTORY: Oncology History Overview Note   Cancer Staging  Prostate cancer [facility]) Staging form: Prostate, AJCC 8th Edition - Clinical stage from 09/27/2021: Stage IVB (cTX, cNX, cM1b) - Signed by Creig Hines, MD on 09/27/2021     Prostate cancer (HCC)  08/01/2020 Initial Diagnosis   Prostate cancer (HCC)   09/27/2021 Cancer Staging   Staging form: Prostate, AJCC 8th Edition - Clinical stage from 09/27/2021: Stage IVB (cTX, cNX, cM1b) - Signed by Creig Hines, MD on 09/27/2021   01/13/2023 Imaging    IMPRESSION: 1. Widespread bony metastatic disease involving axial and appendicular skeleton with marked increased radiotracer accumulation. Some areas with sclerosis and other areas with lucency or without CT correlate as discussed. Metastatic disease is much more widespread based on comparison with prior imaging which was not performed with PSMA PET. 2. Enlarging AP window lymph node with moderate radiotracer accumulation, suspicious for metastatic disease. Other lymph node in the RIGHT mediastinum without change with mild radiotracer accumulation. 3. Marked radiotracer accumulation in the prostate. 4. Small lymph nodes scattered throughout the pelvis with radiotracer accumulation that is mild-to-moderate, nonspecific but  suspicious given other findings. Attention on follow-up. This includes bilateral groin lymph nodes largest on the RIGHT. 5. 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 6. Signs of prior  colonic resection in the LEFT hemiabdomen.      INTERVAL HISTORY:  Terry [name] is here for a follow up of prostate cancer. He was last seen by me on Apr 14, 2023. He presents to the clinic alone.  He was hospitalized in July 2024 due to multiple fall, and progressive weakness.  He was discharged to nursing home afterwards, he did well with the rehab, and was discharged home a few days ago.  He is compliant with Zytiga, but feels that his fatigue and for a partially related to Zytiga.   All other systems were reviewed with the patient and are negative.  MEDICAL HISTORY:  Past Medical History:  Diagnosis Date   Arthritis    Cancer (HCC)    [employer] 08/04/2020   CHF, chronic (HCC)    diastolic   CKD (chronic kidney disease) stage 3, GFR 30-59 ml/min (HCC)    Diabetes mellitus without complication (HCC)    Dysrhythmia    Hypertension    Hypothyroidism    Lymphedema    Paroxysmal atrial fibrillation (HCC)    Prostate cancer (HCC)    Renal insufficiency     SURGICAL HISTORY: Past Surgical History:  Procedure Laterality Date   COLON SURGERY N/A    [employer] 08/04/2020    I have reviewed the social history and family history with the patient and they are unchanged from previous note.  ALLERGIES:  is allergic to lisinopril.  MEDICATIONS:  Current Outpatient Medications  Medication Sig Dispense Refill   abiraterone acetate (ZYTIGA) 250 MG tablet Take 4 tablets (1,000 mg total) by mouth daily. Take on an empty stomach 1 hour before or 2 hours after a meal (Patient taking differently: Take 500 mg by mouth 2 (two) times daily. Take on an empty stomach 1 hour before or 2 hours after a meal) 120 tablet 5   allopurinol (ZYLOPRIM) 100 MG tablet Take 0.5 tablets (50 mg total) by mouth daily. 30 tablet 6   apixaban (ELIQUIS) 2.5 MG TABS tablet Take 1 tablet (2.5 mg total) by mouth 2 (two) times daily. 60 tablet 11   carvedilol (COREG) 25 MG tablet Take 25 mg by mouth 2 (two) times daily.      gabapentin (NEURONTIN) 300 MG capsule Take 1 capsule (300 mg total) by mouth 3 times/day as needed-between meals & bedtime. (Patient taking differently: Take 300 mg by mouth 3 (three) times daily.) 30 capsule 3   hydrALAZINE (APRESOLINE) 50 MG tablet Take 1 tablet (50 mg total) by mouth every 8 (eight) hours. 90 tablet 0   Iron, Ferrous Sulfate, 325 (65 Fe) MG TABS Take 325 mg by mouth daily. 30 tablet 3   levothyroxine (SYNTHROID) 50 MCG tablet Take 1 tablet (50 mcg total) by mouth daily. 90 tablet 3   losartan (COZAAR) 100 MG tablet Take 1 tablet (100 mg total) by mouth daily. 30 tablet 0   predniSONE (DELTASONE) 5 MG tablet Take 1 tablet (5 mg total) by mouth daily with breakfast. 90 tablet 1   traMADol (ULTRAM) 50 MG tablet Take 1 tablet (50 mg total) by mouth every 8 (eight) hours as needed. 15 tablet 0   VITAMIN D PO Take 1 tablet by mouth daily.     VITAMIN E PO Take 1 tablet by mouth daily.     No  current facility-administered medications for this visit.   Facility-Administered Medications Ordered in Other Visits  Medication Dose Route Frequency Provider Last Rate Last Admin   denosumab (XGEVA) injection 120 mg  120 mg Subcutaneous Once Malachy Mood, MD       leuprolide (6 Month) [facility]) injection 45 mg  45 mg Subcutaneous Q6 months Creig Hines, MD   45 mg at 12/28/22 1549   leuprolide (6 Month) (ELIGARD) injection 45 mg  45 mg Subcutaneous Q6 months Malachy Mood, MD        PHYSICAL EXAMINATION: ECOG PERFORMANCE STATUS: 3 - Symptomatic, >50% confined to bed  Vitals:   06/29/23 1235 06/29/23 1255  BP: (!) 142/96 134/84  Pulse: (!) 55   Resp: 18   Temp: 98.8 F (37.1 C)   SpO2: 99%    Wt Readings from Last 3 Encounters:  06/06/23 (!) 305 lb 1.9 oz (138.4 kg)  05/18/23 300 lb (136.1 kg)  05/06/23 (!) 318 lb (144.2 kg)     GENERAL:alert, no distress and comfortable, obese elderly gentleman SKIN: skin color, texture, turgor are normal, no rashes or significant  lesions EYES: normal, Conjunctiva are pink and non-injected, sclera clear NECK: supple, thyroid normal size, non-tender, without nodularity LYMPH:  no palpable lymphadenopathy in the cervical, axillary  LUNGS: clear to auscultation and percussion with normal breathing effort HEART: regular rate & rhythm and no murmurs and no lower extremity edema ABDOMEN:abdomen soft, non-tender and normal bowel sounds Musculoskeletal:no cyanosis of digits and no clubbing, (+) chronic bilateral lower extremity edema. NEURO: alert & oriented x 3 with fluent speech, no focal motor/sensory deficits  LABORATORY DATA:  I have reviewed the data as listed    Latest Ref Rng & Units 06/29/2023   12:06 PM 06/02/2023    8:21 AM 06/01/2023    1:58 PM  CBC  WBC 4.0 - 10.5 K/uL 5.9  8.4  6.4   Hemoglobin 13.0 - 17.0 g/dL 16.1  09.6  04.5   Hematocrit 39.0 - 52.0 % 33.5  39.1  40.5   Platelets 150 - 400 K/uL 174  191  186         Latest Ref Rng & Units 06/29/2023   12:06 PM 06/05/2023    5:51 AM 06/04/2023    9:52 AM  CMP  Glucose 70 - 99 mg/dL 96  94  409   BUN 8 - 23 mg/dL 14  26  23    Creatinine 0.61 - 1.24 mg/dL 8.11  9.14  7.82   Sodium 135 - 145 mmol/L 145  140  135   Potassium 3.5 - 5.1 mmol/L 3.1  3.7  3.8   Chloride 98 - 111 mmol/L 112  109  108   CO2 22 - 32 mmol/L 29  24  22    Calcium 8.9 - 10.3 mg/dL 9.0  9.1  8.7   Total Protein 6.5 - 8.1 g/dL 6.9     Total Bilirubin 0.3 - 1.2 mg/dL 0.4     Alkaline Phos 38 - 126 U/L 69     AST 15 - 41 U/L 13     ALT 0 - 44 U/L 9         RADIOGRAPHIC STUDIES: I have personally reviewed the radiological images as listed and agreed with the findings in the report. No results found.    No orders of the defined types were placed in this encounter.  All questions were answered. The patient knows to call the clinic with any problems, questions  or concerns. No barriers to learning was detected. The total time spent in the appointment was 30 minutes.      Malachy Mood, MD 06/29/2023

## 2023-06-29 NOTE — Progress Notes (Signed)
Ok to proceed with Rivka Barbara today without dental clearance form from new dentist.  Richardean Sale, RPH, BCPS, BCOP 06/29/2023 1:21 PM

## 2023-06-29 NOTE — Patient Instructions (Signed)
Leuprolide Suspension for Injection (Prostate Cancer) What is this medication? LEUPROLIDE (loo PROE lide) reduces the symptoms of prostate cancer. It works by decreasing levels of the hormone testosterone in the body. This prevents prostate cancer cells from spreading or growing. This medicine may be used for other purposes; ask your health care provider or pharmacist if you have questions. COMMON BRAND NAME(S): Eligard, Lupron Depot, Lutrate Depot What should I tell my care team before I take this medication? They need to know if you have any of these conditions: Diabetes Heart disease Heart failure High or low levels of electrolytes, such as magnesium, potassium, or sodium in your blood Irregular heartbeat or rhythm Seizures An unusual or allergic reaction to leuprolide, other medications, foods, dyes, or preservatives Pregnant or trying to get pregnant Breast-feeding How should I use this medication? This medication is injected under the skin or into a muscle. It is given by your care team in a hospital or clinic setting. Talk to your care team about the use of this medication in children. Special care may be needed. Overdosage: If you think you have taken too much of this medicine contact a poison control center or emergency room at once. NOTE: This medicine is only for you. Do not share this medicine with others. What if I miss a dose? Keep appointments for follow-up doses. It is important not to miss your dose. Call your care team if you are unable to keep an appointment. What may interact with this medication? Do not take this medication with any of the following: Cisapride Dronedarone Ketoconazole Levoketoconazole Pimozide Thioridazine This medication may also interact with the following: Other medications that cause heart rhythm changes This list may not describe all possible interactions. Give your health care provider a list of all the medicines, herbs, non-prescription  drugs, or dietary supplements you use. Also tell them if you smoke, drink alcohol, or use illegal drugs. Some items may interact with your medicine. What should I watch for while using this medication? Visit your care team for regular checks on your progress. Tell your care team if your symptoms do not start to get better or if they get worse. This medication may increase blood sugar. The risk may be higher in patients who already have diabetes. Ask your care team what you can do to lower the risk of diabetes while taking this medication. This medication may cause infertility. Talk to your care team if you are concerned about your fertility. Heart attacks and strokes have been reported with the use of this medication. Get emergency help if you develop signs or symptoms of a heart attack or stroke. Talk to your care team about the risks and benefits of this medication. What side effects may I notice from receiving this medication? Side effects that you should report to your care team as soon as possible: Allergic reactions--skin rash, itching, hives, swelling of the face, lips, tongue, or throat Heart attack--pain or tightness in the chest, shoulders, arms, or jaw, nausea, shortness of breath, cold or clammy skin, feeling faint or lightheaded Heart rhythm changes--fast or irregular heartbeat, dizziness, feeling faint or lightheaded, chest pain, trouble breathing High blood sugar (hyperglycemia)--increased thirst or amount of urine, unusual weakness or fatigue, blurry vision New or worsening seizures Redness, blistering, peeling, or loosening of the skin, including inside the mouth Stroke--sudden numbness or weakness of the face, arm, or leg, trouble speaking, confusion, trouble walking, loss of balance or coordination, dizziness, severe headache, change in vision Swelling and pain  of the tumor site or lymph nodes Side effects that usually do not require medical attention (report these to your care  team if they continue or are bothersome): Change in sex drive or performance Hot flashes Joint pain Pain, redness, or irritation at injection site Swelling of the ankles, hands, or feet Unusual weakness or fatigue This list may not describe all possible side effects. Call your doctor for medical advice about side effects. You may report side effects to FDA at 1-800-FDA-1088. Where should I keep my medication? This medication is given in a hospital or clinic. It will not be stored at home. NOTE: This sheet is a summary. It may not cover all possible information. If you have questions about this medicine, talk to your doctor, pharmacist, or health care provider.  2024 Elsevier/Gold Standard (2023-03-04 00:00:00) Denosumab Injection (Oncology) What is this medication? DENOSUMAB (den oh SUE mab) prevents weakened bones caused by cancer. It may also be used to treat noncancerous bone tumors that cannot be removed by surgery. It can also be used to treat high calcium levels in the blood caused by cancer. It works by blocking a protein that causes bones to break down quickly. This slows down the release of calcium from bones, which lowers calcium levels in your blood. It also makes your bones stronger and less likely to break (fracture). This medicine may be used for other purposes; ask your health care provider or pharmacist if you have questions. COMMON BRAND NAME(S): XGEVA What should I tell my care team before I take this medication? They need to know if you have any of these conditions: Dental disease Having surgery or tooth extraction Infection Kidney disease Low levels of calcium or vitamin D in the blood Malnutrition On hemodialysis Skin conditions or sensitivity Thyroid or parathyroid disease An unusual reaction to denosumab, other medications, foods, dyes, or preservatives Pregnant or trying to get pregnant Breast-feeding How should I use this medication? This medication is for  injection under the skin. It is given by your care team in a hospital or clinic setting. A special MedGuide will be given to you before each treatment. Be sure to read this information carefully each time. Talk to your care team about the use of this medication in children. While it may be prescribed for children as young as 13 years for selected conditions, precautions do apply. Overdosage: If you think you have taken too much of this medicine contact a poison control center or emergency room at once. NOTE: This medicine is only for you. Do not share this medicine with others. What if I miss a dose? Keep appointments for follow-up doses. It is important not to miss your dose. Call your care team if you are unable to keep an appointment. What may interact with this medication? Do not take this medication with any of the following: Other medications containing denosumab This medication may also interact with the following: Medications that lower your chance of fighting infection Steroid medications, such as prednisone or cortisone This list may not describe all possible interactions. Give your health care provider a list of all the medicines, herbs, non-prescription drugs, or dietary supplements you use. Also tell them if you smoke, drink alcohol, or use illegal drugs. Some items may interact with your medicine. What should I watch for while using this medication? Your condition will be monitored carefully while you are receiving this medication. You may need blood work while taking this medication. This medication may increase your risk of getting  an infection. Call your care team for advice if you get a fever, chills, sore throat, or other symptoms of a cold or flu. Do not treat yourself. Try to avoid being around people who are sick. You should make sure you get enough calcium and vitamin D while you are taking this medication, unless your care team tells you not to. Discuss the foods you eat and  the vitamins you take with your care team. Some people who take this medication have severe bone, joint, or muscle pain. This medication may also increase your risk for jaw problems or a broken thigh bone. Tell your care team right away if you have severe pain in your jaw, bones, joints, or muscles. Tell your care team if you have any pain that does not go away or that gets worse. Talk to your care team if you may be pregnant. Serious birth defects can occur if you take this medication during pregnancy and for 5 months after the last dose. You will need a negative pregnancy test before starting this medication. Contraception is recommended while taking this medication and for 5 months after the last dose. Your care team can help you find the option that works for you. What side effects may I notice from receiving this medication? Side effects that you should report to your care team as soon as possible: Allergic reactions--skin rash, itching, hives, swelling of the face, lips, tongue, or throat Bone, joint, or muscle pain Low calcium level--muscle pain or cramps, confusion, tingling, or numbness in the hands or feet Osteonecrosis of the jaw--pain, swelling, or redness in the mouth, numbness of the jaw, poor healing after dental work, unusual discharge from the mouth, visible bones in the mouth Side effects that usually do not require medical attention (report to your care team if they continue or are bothersome): Cough Diarrhea Fatigue Headache Nausea This list may not describe all possible side effects. Call your doctor for medical advice about side effects. You may report side effects to FDA at 1-800-FDA-1088. Where should I keep my medication? This medication is given in a hospital or clinic. It will not be stored at home. NOTE: This sheet is a summary. It may not cover all possible information. If you have questions about this medicine, talk to your doctor, pharmacist, or health care  provider.  2024 Elsevier/Gold Standard (2022-03-24 00:00:00)

## 2023-06-30 LAB — PROSTATE-SPECIFIC AG, SERUM (LABCORP): Prostate Specific Ag, Serum: 4.1 ng/mL — ABNORMAL HIGH (ref 0.0–4.0)

## 2023-07-01 ENCOUNTER — Ambulatory Visit (INDEPENDENT_AMBULATORY_CARE_PROVIDER_SITE_OTHER): Payer: Medicare Other | Admitting: Family Medicine

## 2023-07-01 VITALS — BP 142/82 | HR 68 | Temp 98.2°F | Ht 67.0 in | Wt 305.0 lb

## 2023-07-01 DIAGNOSIS — R531 Weakness: Secondary | ICD-10-CM | POA: Diagnosis not present

## 2023-07-01 DIAGNOSIS — R197 Diarrhea, unspecified: Secondary | ICD-10-CM

## 2023-07-01 NOTE — Progress Notes (Signed)
Subjective:    Patient ID: Terry [name], male    DOB: Jun 21, 1941, 82 y.o.   MRN: [id number]  Patient has a history of a retained right ureteral stent right-sided hydronephrosis, CHF with preserved EF,  and chronic kidney disease.  He also has a history of metastatic prostate cancer.  He also has a history of noncompliance with medication therapy.  Was recently admitted to the hospital: Admit date: 05/31/2023 Discharge date: 06/06/2023  Brief/Interim Summary: This is a 82 year old male with past medical history of recurrent urinary tract infections , T2DM, HTN, hypothyroidism, paroxysmal atrial fibrillation, HFpEF,  chronic kidney disease stage III, metastatic prostate cancer, OHS, morbid obesity,  and deconditioning presented with weakness. At baseline the patient uses a wheelchair, walker, or a 4-prong seated walker depending on where he is.  In the home he uses a walker.  He found himself having difficulty walking around the home with his walker.  This was first noted 2 weeks ago but has gotten worse over the past 3 to 4 days.  He endorses burning urination for the last 2 weeks and chills.  He denied shortness of breath, cough, fever, nausea, vomiting or confusion.  He was diagnosed with UTI in the ED and admitted under hospitalist service.  Started on Rocephin, eventually urine culture grew Enterococcus, he was transition to amoxicillin and received 5 more days of antibiotics and completed course here.  He has remained afebrile without any symptoms for the last several days.   Generalized weakness: Has chronic debility and deconditioning, typically wheelchair and sometimes walker bound.   patient was reassessed by PT 06/03/2023 and they recommend SNF.  Patient is being discharged to SNF today.   Metastatic prostate cancer: Xgeva injection every 3 months, Zytiga daily, Eligard every 6 months. Continue daily prednisone   Chronic heart failure with preserved ejection fraction: Appears euvolemic.   Continue home medications which include Lasix, Coreg and losartan.   Acquired hypothyroidism: Continue Synthroid.   CKD stage IIIa: Stable.   Hypokalemia: Resolved.   Essential hypertension: Patient's blood pressure was significantly elevated.  Patient already on Coreg maximized.  25 mg p.o. twice daily.  Increased losartan to 100 mg 06/02/2023 and added hydralazine 25 mg p.o. 3 times daily and increased to 50 mg p.o. 3 times daily on 06/03/2023.  Blood pressure has been fairly stable since then.  Being discharged on this regimen.   Paroxysmal atrial fibrillation: Rate controlled.  Continue Coreg and Eliquis.    Discharge Diagnoses:  Principal Problem:   Generalized weakness Active Problems:   CKD (chronic kidney disease) stage 3, GFR 30-59 ml/min (HCC)   Depression with anxiety   Essential hypertension   Morbid obesity due to excess calories (HCC)   CHF, chronic (HCC)   Prostate cancer (HCC)   Complicated UTI (urinary tract infection)   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)   Benign prostatic hyperplasia with lower urinary tract symptoms   Acute cystitis   Patient is here today with his daughter for follow-up.  He has just been released from the nursing facility.  He states that he was doing well while he was in the nursing facility however ever since he is come home, he feels extremely weak.  He reports black diarrhea on a daily basis.  He states that he is only having 1-2 bowel movements a day but it is black.  His hemoglobin did drop when it was checked 48 hours ago at the oncologist office.  He denies  any abdominal pain.  He admits that he has not been taking his hydralazine.  Staff at nursing home.  Blood however is not extremely high.  He also is no longer taking a fluid pill.  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  He denies shortness of breath.  He denies orthopnea.  He denies any coughing.  He denies any pleurisy. Past Medical History:   Diagnosis Date   Arthritis    Cancer (HCC)    Phreesia 08/04/2020   CHF, chronic (HCC)    diastolic   CKD (chronic kidney disease) stage 3, GFR 30-59 ml/min (HCC)    Diabetes mellitus without complication (HCC)    Dysrhythmia    Hypertension    Hypothyroidism    Lymphedema    Paroxysmal atrial fibrillation (HCC)    Prostate cancer [facility])    Renal insufficiency    Past Surgical History:  Procedure Laterality Date   COLON SURGERY N/A    Phreesia 08/04/2020    Allergies  Allergen Reactions   Lisinopril     FACIAL SWELLING   Social History   Socioeconomic History   Marital status: Widowed    Spouse name: Not on file   Number of children: 5   Years of education: 56   Highest education [name]: 12th grade  Occupational History   Not on file  Tobacco Use   Smoking status: Never    Passive exposure: Never   Smokeless tobacco: Never  Vaping Use   Vaping status: Never Used  Substance and Sexual Activity   Alcohol use: Not Currently    Comment: rarely   Drug use: Not Currently    Types: Marijuana    Comment: stopped in 2000   Sexual activity: Not Currently  Other Topics Concern   Not on file  Social History Narrative   Lives with son. Daughter lives in [place].   Social Determinants of Health   Financial Resource Strain: Low Risk  (12/17/2022)   Overall Financial Resource Strain (CARDIA)    Difficulty of Paying Living Expenses: Not very hard  Food Insecurity: Food Insecurity Present (06/01/2023)   Hunger Vital Sign    Worried About Running Out of Food in the Last Year: Often true    Ran Out of Food in the Last Year: Never true  Transportation Needs: No Transportation Needs (06/01/2023)   PRAPARE - Administrator, Civil Service (Medical): No    Lack of Transportation (Non-Medical): No  Physical Activity: Insufficiently Active (12/17/2022)   Exercise Vital Sign    Days of Exercise per Week: 2 days    Minutes of Exercise per Session: 10 min  Stress: No Stress  Concern Present (12/17/2022)   [employer] of Occupational Health - Occupational Stress Questionnaire    Feeling of Stress : Not at all  Social Connections: Socially Isolated (12/17/2022)   Social Connection and Isolation Panel [NHANES]    Frequency of Communication with Friends and Family: More than three times a week    Frequency of Social Gatherings with Friends and Family: Never    Attends Religious Services: Never    Database administrator or Organizations: No    Attends Banker Meetings: Never    Marital Status: Widowed  Intimate Partner Violence: Not At Risk (06/01/2023)   Humiliation, Afraid, Rape, and Kick questionnaire    Fear of Current or Ex-Partner: No    Emotionally Abused: No    Physically Abused: No    Sexually Abused: No  Past Medical History:  Diagnosis Date   Arthritis    Cancer (HCC)    Phreesia 08/04/2020   CHF, chronic (HCC)    diastolic   CKD (chronic kidney disease) stage 3, GFR 30-59 ml/min (HCC)    Diabetes mellitus without complication (HCC)    Dysrhythmia    Hypertension    Hypothyroidism    Lymphedema    Paroxysmal atrial fibrillation (HCC)    Prostate cancer (HCC)    Renal insufficiency       Review of Systems     Objective:   Physical Exam Vitals reviewed.  Constitutional:      General: He is not in acute distress.    Appearance: He is obese. He is not ill-appearing, toxic-appearing or diaphoretic.  HENT:     Head: Normocephalic and atraumatic.     Nose: Nose normal. No congestion or rhinorrhea.     Mouth/Throat:     Pharynx: No oropharyngeal exudate or posterior oropharyngeal erythema.  Eyes:     Extraocular Movements: Extraocular movements intact.     Conjunctiva/sclera: Conjunctivae normal.     Pupils: Pupils are equal, round, and reactive to light.  Cardiovascular:     Rate and Rhythm: Normal rate and regular rhythm.     Heart sounds: Normal heart sounds. No murmur heard.    No friction rub. No gallop.   Pulmonary:     Effort: Pulmonary effort is normal. No respiratory distress.     Breath sounds: Normal breath sounds. No stridor. No wheezing, rhonchi or rales.  Chest:     Chest wall: No tenderness.  Abdominal:     General: Abdomen is flat. Bowel sounds are normal. There is no distension.     Palpations: Abdomen is soft. There is no mass.     Tenderness: There is no abdominal tenderness. There is no guarding or rebound.     Hernia: No hernia is present.  Musculoskeletal:        General: Swelling present.     Right lower leg: Edema present.     Left lower leg: Edema present.  Neurological:     General: No focal deficit present.     Mental Status: He is alert and oriented to person, place, and time. Mental status is at baseline.     Cranial Nerves: No cranial nerve deficit.     Sensory: No sensory deficit.     Motor: No weakness.     Coordination: Coordination normal.     Gait: Gait normal.       Assessment & Plan:  Weakness - Plan: Urinalysis, Routine w reflex microscopic, BASIC METABOLIC PANEL WITH GFR, CBC with Differential/Platelet  Diarrhea, unspecified type - Plan: C difficile Toxins A+B W/Rflx Patient was doing well until he came home from the nursing home.  He states that the minute he entered the door, he felt weak and tired.  He says that is tearfully.  Then he admits that he feels like he is a burden to his daughter.  His daughter states that he is very gruff and can be very mean to her and this takes the joy out of caring for him.  I feel a lot of his weakness may be secondary to this psychological stress.  The patient denies any depression.  I do not appreciate any pneumonia on exam.  However I would like to rule out a urinary tract infection.  The patient has had several recently and is concerned because weakness.  Given his recent hospitalization  also wanted to rule out C. difficile diarrhea.  Given the changing color, I would recommend checking a CBC today to compare the  hemoglobin today to the 1 from August 14.  If there is a significant drop, the patient may also be experiencing bleeding.  I want the patient to resume his hydralazine as uncontrolled high blood pressure potentially could be causing the patient to feel tired.  We spent a large amount of time discussing possibly moving into an assisted living facility.  The patient seems interested in this.  We discussed the admission process.  At the present time he is not ready to go that route

## 2023-07-02 LAB — CBC WITH DIFFERENTIAL/PLATELET
Absolute Monocytes: 725 cells/uL (ref 200–950)
Basophils Absolute: 12 {cells}/uL (ref 0–200)
Basophils Relative: 0.2 %
Eosinophils Absolute: 110 {cells}/uL (ref 15–500)
Eosinophils Relative: 1.9 %
HCT: 32.2 % — ABNORMAL LOW (ref 38.5–50.0)
Hemoglobin: 9.8 g/dL — ABNORMAL LOW (ref 13.2–17.1)
Lymphs Abs: 795 {cells}/uL — ABNORMAL LOW (ref 850–3900)
MCH: 24.3 pg — ABNORMAL LOW (ref 27.0–33.0)
MCHC: 30.4 g/dL — ABNORMAL LOW (ref 32.0–36.0)
MCV: 79.9 fL — ABNORMAL LOW (ref 80.0–100.0)
MPV: 11.4 fL (ref 7.5–12.5)
Monocytes Relative: 12.5 %
Neutro Abs: 4159 {cells}/uL (ref 1500–7800)
Neutrophils Relative %: 71.7 %
Platelets: 168 10*3/uL (ref 140–400)
RBC: 4.03 10*6/uL — ABNORMAL LOW (ref 4.20–5.80)
RDW: 15.1 % — ABNORMAL HIGH (ref 11.0–15.0)
Total Lymphocyte: 13.7 %
WBC: 5.8 10*3/uL (ref 3.8–10.8)

## 2023-07-02 LAB — BASIC METABOLIC PANEL WITH GFR
BUN/Creatinine Ratio: 10 (calc) (ref 6–22)
BUN: 15 mg/dL (ref 7–25)
CO2: 22 mmol/L (ref 20–32)
Calcium: 8.9 mg/dL (ref 8.6–10.3)
Chloride: 110 mmol/L (ref 98–110)
Creat: 1.55 mg/dL — ABNORMAL HIGH (ref 0.70–1.22)
Glucose, Bld: 95 mg/dL (ref 65–99)
Potassium: 3.5 mmol/L (ref 3.5–5.3)
Sodium: 143 mmol/L (ref 135–146)
eGFR: 44 mL/min/{1.73_m2} — ABNORMAL LOW (ref >=60)

## 2023-07-04 ENCOUNTER — Other Ambulatory Visit: Payer: Self-pay

## 2023-07-04 ENCOUNTER — Telehealth: Payer: Self-pay

## 2023-07-04 ENCOUNTER — Encounter (HOSPITAL_COMMUNITY): Payer: Self-pay

## 2023-07-04 ENCOUNTER — Emergency Department (HOSPITAL_COMMUNITY): Payer: Medicare Other

## 2023-07-04 ENCOUNTER — Inpatient Hospital Stay (HOSPITAL_COMMUNITY)
Admission: EM | Admit: 2023-07-04 | Discharge: 2023-07-11 | DRG: 542 | Disposition: A | Discharge status: Skilled Nursing Facility | Payer: Medicare Other | Attending: Internal Medicine | Admitting: Internal Medicine

## 2023-07-04 DIAGNOSIS — Z800 Family history of malignant neoplasm of digestive organs: Secondary | ICD-10-CM

## 2023-07-04 DIAGNOSIS — Z79899 Other long term (current) drug therapy: Secondary | ICD-10-CM

## 2023-07-04 DIAGNOSIS — E876 Hypokalemia: Secondary | ICD-10-CM | POA: Diagnosis not present

## 2023-07-04 DIAGNOSIS — K5900 Constipation, unspecified: Secondary | ICD-10-CM | POA: Diagnosis not present

## 2023-07-04 DIAGNOSIS — C61 Malignant neoplasm of prostate: Secondary | ICD-10-CM | POA: Diagnosis present

## 2023-07-04 DIAGNOSIS — C7951 Secondary malignant neoplasm of bone: Secondary | ICD-10-CM | POA: Diagnosis not present

## 2023-07-04 DIAGNOSIS — W06XXXA Fall from bed, initial encounter: Secondary | ICD-10-CM | POA: Diagnosis present

## 2023-07-04 DIAGNOSIS — I5033 Acute on chronic diastolic (congestive) heart failure: Secondary | ICD-10-CM | POA: Diagnosis not present

## 2023-07-04 DIAGNOSIS — R591 Generalized enlarged lymph nodes: Secondary | ICD-10-CM | POA: Diagnosis present

## 2023-07-04 DIAGNOSIS — M1712 Unilateral primary osteoarthritis, left knee: Secondary | ICD-10-CM | POA: Diagnosis present

## 2023-07-04 DIAGNOSIS — Z7952 Long term (current) use of systemic steroids: Secondary | ICD-10-CM

## 2023-07-04 DIAGNOSIS — R627 Adult failure to thrive: Secondary | ICD-10-CM | POA: Diagnosis present

## 2023-07-04 DIAGNOSIS — Z8249 Family history of ischemic heart disease and other diseases of the circulatory system: Secondary | ICD-10-CM

## 2023-07-04 DIAGNOSIS — D509 Iron deficiency anemia, unspecified: Secondary | ICD-10-CM | POA: Diagnosis present

## 2023-07-04 DIAGNOSIS — I130 Hypertensive heart and chronic kidney disease with heart failure and stage 1 through stage 4 chronic kidney disease, or unspecified chronic kidney disease: Secondary | ICD-10-CM | POA: Diagnosis present

## 2023-07-04 DIAGNOSIS — Z1152 Encounter for screening for COVID-19: Secondary | ICD-10-CM

## 2023-07-04 DIAGNOSIS — D630 Anemia in neoplastic disease: Secondary | ICD-10-CM | POA: Diagnosis present

## 2023-07-04 DIAGNOSIS — Z87442 Personal history of urinary calculi: Secondary | ICD-10-CM

## 2023-07-04 DIAGNOSIS — Z7901 Long term (current) use of anticoagulants: Secondary | ICD-10-CM

## 2023-07-04 DIAGNOSIS — N136 Pyonephrosis: Secondary | ICD-10-CM | POA: Diagnosis present

## 2023-07-04 DIAGNOSIS — N3000 Acute cystitis without hematuria: Secondary | ICD-10-CM

## 2023-07-04 DIAGNOSIS — R531 Weakness: Secondary | ICD-10-CM

## 2023-07-04 DIAGNOSIS — Z79890 Hormone replacement therapy (postmenopausal): Secondary | ICD-10-CM

## 2023-07-04 DIAGNOSIS — Z6841 Body Mass Index (BMI) 40.0 and over, adult: Secondary | ICD-10-CM

## 2023-07-04 DIAGNOSIS — R197 Diarrhea, unspecified: Secondary | ICD-10-CM | POA: Diagnosis present

## 2023-07-04 DIAGNOSIS — J189 Pneumonia, unspecified organism: Principal | ICD-10-CM

## 2023-07-04 DIAGNOSIS — E1122 Type 2 diabetes mellitus with diabetic chronic kidney disease: Secondary | ICD-10-CM | POA: Diagnosis present

## 2023-07-04 DIAGNOSIS — Z87440 Personal history of urinary (tract) infections: Secondary | ICD-10-CM

## 2023-07-04 DIAGNOSIS — Y92009 Unspecified place in unspecified non-institutional (private) residence as the place of occurrence of the external cause: Secondary | ICD-10-CM

## 2023-07-04 DIAGNOSIS — I480 Paroxysmal atrial fibrillation: Secondary | ICD-10-CM | POA: Diagnosis present

## 2023-07-04 DIAGNOSIS — Z888 Allergy status to other drugs, medicaments and biological substances status: Secondary | ICD-10-CM

## 2023-07-04 DIAGNOSIS — N1831 Chronic kidney disease, stage 3a: Secondary | ICD-10-CM | POA: Diagnosis present

## 2023-07-04 DIAGNOSIS — R590 Localized enlarged lymph nodes: Secondary | ICD-10-CM | POA: Diagnosis present

## 2023-07-04 DIAGNOSIS — M25552 Pain in left hip: Secondary | ICD-10-CM | POA: Diagnosis present

## 2023-07-04 DIAGNOSIS — E039 Hypothyroidism, unspecified: Secondary | ICD-10-CM | POA: Diagnosis present

## 2023-07-04 DIAGNOSIS — Z809 Family history of malignant neoplasm, unspecified: Secondary | ICD-10-CM

## 2023-07-04 DIAGNOSIS — Z515 Encounter for palliative care: Secondary | ICD-10-CM

## 2023-07-04 DIAGNOSIS — Z833 Family history of diabetes mellitus: Secondary | ICD-10-CM

## 2023-07-04 DIAGNOSIS — M25562 Pain in left knee: Secondary | ICD-10-CM | POA: Diagnosis present

## 2023-07-04 MED ORDER — OXYCODONE-ACETAMINOPHEN 5-325 MG PO TABS
1.0000 | ORAL_TABLET | Freq: Once | ORAL | Status: AC
  Administered 2023-07-04: 1 via ORAL
  Filled 2023-07-04: qty 1

## 2023-07-04 NOTE — ED Triage Notes (Signed)
BIB [facility] from home. Pt slide out of his bed yesterday. Today pt is having L leg pain. EMS report pt was unable to ambulate on that leg. Pt is A/Ox4.   EMS Vitals   190/100 HR 72 SpO2 98% on R/A CBG 119

## 2023-07-04 NOTE — Telephone Encounter (Signed)
Inetta Fermo, RN with pt's [facility] has called and states the patient slid out of his bed over the weekend and has been unable to move his L leg since. Deatra James to call 911 to take patient to ER to be evaluated. Inetta Fermo verbalized understanding of all. Mjp,lpn

## 2023-07-04 NOTE — ED Provider Notes (Signed)
[place] [facility] AT [facility] Provider Note   CSN: [id number] Arrival date & time: 07/04/23  1320     History {Add pertinent medical, surgical, social history, OB history to HPI:1} Chief Complaint  Patient presents with   Terry [name] is a 82 y.o. male.  HPI   Patient with medical history including hypertension, atrial fibrillation currently on Eliquis, prostate cancer, CHF, CKD stage III, diabetes, presenting with complaints of left leg pain, states started yesterday after he slid off the bed.  States that he sat down on the bed and then slid off bed states he believes he landed on his left hip, but he is unclear, states that he had a hard time getting up due to pain, states pain radiates from his left hip down to his feet, states is constant but worsened with movement, he is unclear whether or not he hit his head or loss consciousness.  He also notes that he feels generalized weakness, states been going on since he was discharged from the nursing facility, states he has full body just feels weak, achy, he admits to nasal congestion and a slight cough which is nonproductive, no associate chest pain or shortness of breath nonnursing stomach pains nausea or vomiting admits to multiple episode of diarrhea, states been going on for about 5 days time, states she has 2-3 episodes of diarrhea daily, states it is dark in appearance, denies any frank hematochezia.  He currently on Eliquis has not missed any dosages.  He admits to some urinary frequency with dysuria denies any hematuria.  I reviewed patient's chart into the hospital for on the 16th discharged on the eighth due to UTI, started on ceftriaxone, transition to Augmentin.  Went to the nursing facility, upon getting out of the nursing facility states she has generalized weakness, was seen by his primary care doctor feels this is likely from deconditioned state, PCP is going to check C. difficile and UA for  possible infections.  Home Medications Prior to Admission medications   Medication Sig Start Date End Date Taking? Authorizing Provider  abiraterone acetate (ZYTIGA) 250 MG tablet Take 4 tablets (1,000 mg total) by mouth daily. Take on an empty stomach 1 hour before or 2 hours after a meal Patient not taking: Reported on 07/01/2023 03/18/23   Malachy Mood, MD  allopurinol (ZYLOPRIM) 100 MG tablet Take 0.5 tablets (50 mg total) by mouth daily. 07/22/22   Donita Brooks, MD  apixaban (ELIQUIS) 2.5 MG TABS tablet Take 1 tablet (2.5 mg total) by mouth 2 (two) times daily. 07/22/22   Donita Brooks, MD  carvedilol (COREG) 25 MG tablet Take 25 mg by mouth 2 (two) times daily. 02/15/23   [provider]  gabapentin (NEURONTIN) 300 MG capsule Take 1 capsule (300 mg total) by mouth 3 times/day as needed-between meals & bedtime. Patient taking differently: Take 300 mg by mouth 3 (three) times daily. 01/10/23   Donita Brooks, MD  hydrALAZINE (APRESOLINE) 50 MG tablet Take 1 tablet (50 mg total) by mouth every 8 (eight) hours. 06/06/23 07/06/23  Hughie Closs, MD  Iron, Ferrous Sulfate, 325 (65 Fe) MG TABS Take 325 mg by mouth daily. 07/16/22   Donita Brooks, MD  levothyroxine (SYNTHROID) 50 MCG tablet Take 1 tablet (50 mcg total) by mouth daily. 12/24/22   Donita Brooks, MD  losartan (COZAAR) 100 MG tablet Take 1 tablet (100 mg total) by mouth daily. 06/07/23   Pahwani,  Daleen Bo, MD  predniSONE (DELTASONE) 5 MG tablet Take 1 tablet (5 mg total) by mouth daily with breakfast. Patient not taking: Reported on 07/01/2023 01/17/23   Malachy Mood, MD  traMADol (ULTRAM) 50 MG tablet Take 1 tablet (50 mg total) by mouth every 8 (eight) hours as needed. 06/04/23 07/04/23  Hughie Closs, MD  VITAMIN D PO Take 1 tablet by mouth daily.    [provider]  VITAMIN E PO Take 1 tablet by mouth daily.    [provider]      Allergies    Lisinopril    Review of Systems   Review of Systems   Constitutional:  Negative for chills and fever.  Respiratory:  Negative for shortness of breath.   Cardiovascular:  Negative for chest pain.  Gastrointestinal:  Negative for abdominal pain.  Musculoskeletal:        Left leg pain  Neurological:  Positive for weakness. Negative for headaches.    Physical Exam Updated Vital Signs BP (!) 178/99   Pulse (!) 54   Temp 98.1 F (36.7 C) (Oral)   Resp 16   Ht 5\' 7"  (1.702 m)   Wt 136.1 kg   SpO2 99%   BMI 46.99 kg/m  Physical Exam Vitals and nursing note reviewed.  Constitutional:      General: He is not in acute distress.    Appearance: He is not ill-appearing.  HENT:     Head: Normocephalic and atraumatic.     Comments: No gross deformity of the head present no raccoon eyes or Battle sign noted.    Nose: No congestion.     Mouth/Throat:     Mouth: Mucous membranes are moist.     Pharynx: Oropharynx is clear.     Comments: No trismus no torticollis no oral trauma Eyes:     Extraocular Movements: Extraocular movements intact.     Conjunctiva/sclera: Conjunctivae normal.     Pupils: Pupils are equal, round, and reactive to light.  Cardiovascular:     Rate and Rhythm: Normal rate and regular rhythm.     Pulses: Normal pulses.     Heart sounds: No murmur heard.    No friction rub. No gallop.  Pulmonary:     Effort: No respiratory distress.     Breath sounds: No wheezing, rhonchi or rales.  Abdominal:     General: There is distension.     Palpations: Abdomen is soft.     Tenderness: There is abdominal tenderness. There is no right CVA tenderness or left CVA tenderness.     Comments: Noted surgical scar over the mid abdomen, abdomen slightly distended with ventral hernia present, slightly tender to palpation around the umbilicus, without guarding rebound tenderness or peritoneal sign.  Musculoskeletal:     Right lower leg: Edema present.     Left lower leg: Edema present.     Comments: Patient peripheral edema nonpitting, he  has 2+ dorsal pedal pulses bilaterally, spine was palpated was nontender to palpation no step-off deformities noted no pelvis instability no leg shortening, patient has no point tenderness in the lower extremities on my exam but states when he lifts his left leg off the bed he develops slight left hip pain.  Skin:    General: Skin is warm and dry.  Neurological:     Mental Status: He is alert.     GCS: GCS eye subscore is 4. GCS verbal subscore is 5. GCS motor subscore is 6.     Cranial  Nerves: Cranial nerves 2-12 are intact.     Motor: Motor function is intact. No weakness.     Coordination: Romberg sign negative. Finger-Nose-Finger Test normal.     Comments: Cranial nerves II through XII grossly intact no difficulty with word finding following two-step commands there is no unilateral weakness present.  Psychiatric:        Mood and Affect: Mood normal.     ED Results / Procedures / Treatments   Labs (all labs ordered are listed, but only abnormal results are displayed) Labs Reviewed  GASTROINTESTINAL PANEL BY PCR, STOOL (REPLACES STOOL CULTURE)  C DIFFICILE QUICK SCREEN W PCR REFLEX    RESP PANEL BY RT-PCR (RSV, FLU A&B, COVID)  [id number]  BASIC METABOLIC PANEL  CBC WITH DIFFERENTIAL/PLATELET  BRAIN NATRIURETIC PEPTIDE  URINALYSIS, ROUTINE W REFLEX MICROSCOPIC  TROPONIN I (HIGH SENSITIVITY)    EKG None  Radiology DG Lumbar Spine Complete  Result Date: 07/04/2023 CLINICAL DATA:  Fall, back pain EXAM: LUMBAR SPINE - COMPLETE 4+ VIEW COMPARISON:  PET-CT 01/13/2023 and lumbar radiographs 09/24/2011 FINDINGS: Right double-J ureteral stent noted. Possible stones along the stent at the level of the iliac crest. Sclerosis in the lower lumbar spine compatible with metastatic lesions. Body habitus reduces diagnostic sensitivity and specificity. Poor cortical definition in some areas due to body habitus. Lower thoracic spondylosis. I do not see a definite fracture or acute subluxation.  IMPRESSION: 1. No definite fracture or acute subluxation. Substantially reduced sensitivity due to body habitus, consider CT if there is a high clinical index of suspicion. 2. Sclerosis in the lower lumbar spine compatible with metastatic lesions. 3. Right double-J ureteral stent with possible stones along the stent at the level of the iliac crest. Electronically Signed   By: Gaylyn Rong M.D.   On: 07/04/2023 16:36   DG Hip Unilat With Pelvis 2-3 Views Left  Result Date: 07/04/2023 CLINICAL DATA:  Fall, injury EXAM: DG HIP (WITH OR WITHOUT PELVIS) 2-3V LEFT COMPARISON:  PET-CT 01/13/2023 FINDINGS: Distal portion of a right double-J ureteral stent is noted with loop formed in the urinary bladder. Cannot exclude stones or concretions adjacent to the tube at the level of the right iliac crest. Sclerotic lesions in the pelvis, most are plea notable in the right ischium, compatible with metastatic lesions. No fracture or acute bony findings identified. IMPRESSION: 1. No acute bony findings. 2. 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 3. Right double-J ureteral stent in place. Cannot exclude stones or concretions adjacent to the tube at the vertical level of the right iliac crest. Electronically Signed   By: Gaylyn Rong M.D.   On: 07/04/2023 16:33    Procedures Procedures  {Document cardiac monitor, telemetry assessment procedure when appropriate:1}  Medications Ordered in ED Medications  oxyCODONE-acetaminophen (PERCOCET/ROXICET) 5-325 MG per tablet 1 tablet (1 tablet Oral Given 07/04/23 1428)    ED Course/ Medical Decision Making/ A&P   {   Click here for ABCD2, HEART and other calculatorsREFRESH Note before signing :1}                              Medical Decision Making Amount and/or Complexity of Data Reviewed Labs: ordered. Radiology: ordered.  Risk Prescription drug management.   This patient presents to the ED for concern of left leg pain, this  involves an extensive number of treatment options, and is a complaint that carries with it a high risk of  complications and morbidity.  The differential diagnosis includes fracture, dislocation, compartment syndrome, cauda equina,    Additional history obtained:  Additional history obtained from daughter at bedside External records from outside source obtained and reviewed including primary care notes   Co morbidities that complicate the patient evaluation  A-fib currently on Eliquis, CHF,  Social Determinants of Health:  Deconditioned state    Lab Tests:  I Ordered, and personally interpreted labs.  The pertinent results include:  ***   Imaging Studies ordered:  I ordered imaging studies including plain film of the lumbar spine left hip, chest x-ray, CT head I independently visualized and interpreted imaging which showed plain films are unremarkable I agree with the radiologist interpretation   Cardiac Monitoring:  The patient was maintained on a cardiac monitor.  I personally viewed and interpreted the cardiac monitored which showed an underlying rhythm of: **   Medicines ordered and prescription drug management:  I ordered medication including ***  for ***  I have reviewed the patients home medicines and have made adjustments as needed  Critical Interventions:  ***   Reevaluation:  Presents with left-sided hip pain/leg pain, triggers obtain imaging which I personally reviewed unremarkable, my examination he is endorsing weakness, he is on anticoag, dark tarry stools and dysuria, will obtain screening lab workup, send down for CT head to rule out possible brain bleed as he is difficult historian and had a fall.  Consultations Obtained:  I requested consultation with the ***,  and discussed lab and imaging findings as well as pertinent plan - they recommend: ***    Test Considered:  ***    Rule out ****    Dispostion and problem list  After  consideration of the diagnostic results and the patients response to treatment, I feel that the patent would benefit from ***.       {Document critical care time when appropriate:1} {Document review of labs and clinical decision tools ie heart score, Chads2Vasc2 etc:1}  {Document your independent review of radiology images, and any outside records:1} {Document your discussion with family members, caretakers, and with consultants:1} {Document social determinants of health affecting pt's care:1} {Document your decision making why or why not admission, treatments were needed:1} Final Clinical Impression(s) / ED Diagnoses Final diagnoses:  None    Rx / DC Orders ED Discharge Orders     None

## 2023-07-05 ENCOUNTER — Other Ambulatory Visit: Payer: Self-pay

## 2023-07-05 ENCOUNTER — Other Ambulatory Visit: Payer: Self-pay | Admitting: Hematology

## 2023-07-05 ENCOUNTER — Emergency Department (HOSPITAL_COMMUNITY): Payer: Medicare Other

## 2023-07-05 ENCOUNTER — Inpatient Hospital Stay: Payer: Medicare Other | Admitting: Licensed Clinical Social Worker

## 2023-07-05 ENCOUNTER — Encounter: Payer: Self-pay | Admitting: Hematology

## 2023-07-05 ENCOUNTER — Encounter: Payer: Self-pay | Admitting: Family Medicine

## 2023-07-05 DIAGNOSIS — R591 Generalized enlarged lymph nodes: Secondary | ICD-10-CM | POA: Diagnosis present

## 2023-07-05 DIAGNOSIS — R197 Diarrhea, unspecified: Secondary | ICD-10-CM | POA: Diagnosis present

## 2023-07-05 DIAGNOSIS — N136 Pyonephrosis: Secondary | ICD-10-CM | POA: Diagnosis present

## 2023-07-05 DIAGNOSIS — N3000 Acute cystitis without hematuria: Secondary | ICD-10-CM | POA: Diagnosis not present

## 2023-07-05 DIAGNOSIS — C61 Malignant neoplasm of prostate: Secondary | ICD-10-CM

## 2023-07-05 DIAGNOSIS — E876 Hypokalemia: Secondary | ICD-10-CM | POA: Diagnosis not present

## 2023-07-05 DIAGNOSIS — Z1152 Encounter for screening for COVID-19: Secondary | ICD-10-CM | POA: Diagnosis not present

## 2023-07-05 DIAGNOSIS — W06XXXA Fall from bed, initial encounter: Secondary | ICD-10-CM | POA: Diagnosis present

## 2023-07-05 DIAGNOSIS — D509 Iron deficiency anemia, unspecified: Secondary | ICD-10-CM | POA: Diagnosis present

## 2023-07-05 DIAGNOSIS — J189 Pneumonia, unspecified organism: Secondary | ICD-10-CM | POA: Diagnosis not present

## 2023-07-05 DIAGNOSIS — R627 Adult failure to thrive: Secondary | ICD-10-CM | POA: Diagnosis present

## 2023-07-05 DIAGNOSIS — C7951 Secondary malignant neoplasm of bone: Secondary | ICD-10-CM | POA: Diagnosis present

## 2023-07-05 DIAGNOSIS — R531 Weakness: Secondary | ICD-10-CM

## 2023-07-05 DIAGNOSIS — I130 Hypertensive heart and chronic kidney disease with heart failure and stage 1 through stage 4 chronic kidney disease, or unspecified chronic kidney disease: Secondary | ICD-10-CM | POA: Diagnosis present

## 2023-07-05 DIAGNOSIS — R29898 Other symptoms and signs involving the musculoskeletal system: Secondary | ICD-10-CM | POA: Diagnosis not present

## 2023-07-05 DIAGNOSIS — Y92009 Unspecified place in unspecified non-institutional (private) residence as the place of occurrence of the external cause: Secondary | ICD-10-CM | POA: Diagnosis not present

## 2023-07-05 DIAGNOSIS — D630 Anemia in neoplastic disease: Secondary | ICD-10-CM | POA: Diagnosis present

## 2023-07-05 DIAGNOSIS — M7989 Other specified soft tissue disorders: Secondary | ICD-10-CM | POA: Diagnosis not present

## 2023-07-05 DIAGNOSIS — Z6841 Body Mass Index (BMI) 40.0 and over, adult: Secondary | ICD-10-CM | POA: Diagnosis not present

## 2023-07-05 DIAGNOSIS — I5033 Acute on chronic diastolic (congestive) heart failure: Secondary | ICD-10-CM | POA: Diagnosis not present

## 2023-07-05 DIAGNOSIS — M1712 Unilateral primary osteoarthritis, left knee: Secondary | ICD-10-CM | POA: Diagnosis present

## 2023-07-05 DIAGNOSIS — K5900 Constipation, unspecified: Secondary | ICD-10-CM | POA: Diagnosis not present

## 2023-07-05 DIAGNOSIS — M25552 Pain in left hip: Secondary | ICD-10-CM | POA: Diagnosis present

## 2023-07-05 DIAGNOSIS — E1122 Type 2 diabetes mellitus with diabetic chronic kidney disease: Secondary | ICD-10-CM | POA: Diagnosis present

## 2023-07-05 DIAGNOSIS — M25562 Pain in left knee: Secondary | ICD-10-CM | POA: Diagnosis present

## 2023-07-05 DIAGNOSIS — R590 Localized enlarged lymph nodes: Secondary | ICD-10-CM | POA: Diagnosis present

## 2023-07-05 DIAGNOSIS — E039 Hypothyroidism, unspecified: Secondary | ICD-10-CM | POA: Diagnosis present

## 2023-07-05 DIAGNOSIS — N1831 Chronic kidney disease, stage 3a: Secondary | ICD-10-CM | POA: Diagnosis present

## 2023-07-05 DIAGNOSIS — I480 Paroxysmal atrial fibrillation: Secondary | ICD-10-CM | POA: Diagnosis present

## 2023-07-05 DIAGNOSIS — Z515 Encounter for palliative care: Secondary | ICD-10-CM | POA: Diagnosis not present

## 2023-07-05 LAB — CBC WITH DIFFERENTIAL/PLATELET
Abs Immature Granulocytes: 0.02 10*3/uL (ref 0.00–0.07)
Basophils Absolute: 0 10*3/uL (ref 0.0–0.1)
Basophils Relative: 0 %
Eosinophils Absolute: 0.1 10*3/uL (ref 0.0–0.5)
Eosinophils Relative: 2 %
HCT: 37.1 % — ABNORMAL LOW (ref 39.0–52.0)
Hemoglobin: 11.2 g/dL — ABNORMAL LOW (ref 13.0–17.0)
Immature Granulocytes: 0 %
Lymphocytes Relative: 15 %
Lymphs Abs: 0.9 10*3/uL (ref 0.7–4.0)
MCH: 24.1 pg — ABNORMAL LOW (ref 26.0–34.0)
MCHC: 30.2 g/dL (ref 30.0–36.0)
MCV: 80 fL (ref 80.0–100.0)
Monocytes Absolute: 0.7 10*3/uL (ref 0.1–1.0)
Monocytes Relative: 12 %
Neutro Abs: 4.2 10*3/uL (ref 1.7–7.7)
Neutrophils Relative %: 71 %
Platelets: 168 10*3/uL (ref 150–400)
RBC: 4.64 MIL/uL (ref 4.22–5.81)
RDW: 17.6 % — ABNORMAL HIGH (ref 11.5–15.5)
WBC: 6 10*3/uL (ref 4.0–10.5)
nRBC: 0 % (ref 0.0–0.2)

## 2023-07-05 LAB — URINALYSIS, ROUTINE W REFLEX MICROSCOPIC
Bilirubin Urine: NEGATIVE
Glucose, UA: NEGATIVE mg/dL
Ketones, ur: 5 mg/dL — AB
Nitrite: NEGATIVE
Protein, ur: NEGATIVE mg/dL
Specific Gravity, Urine: 1.009 (ref 1.005–1.030)
pH: 7 (ref 5.0–8.0)

## 2023-07-05 LAB — RESP PANEL BY RT-PCR (RSV, FLU A&B, COVID)  RVPGX2
Influenza A by PCR: NEGATIVE
Influenza B by PCR: NEGATIVE
Resp Syncytial Virus by PCR: NEGATIVE
SARS Coronavirus 2 by RT PCR: NEGATIVE

## 2023-07-05 LAB — D-DIMER, QUANTITATIVE: D-Dimer, Quant: 0.51 ug{FEU}/mL — ABNORMAL HIGH (ref 0.00–0.50)

## 2023-07-05 LAB — BASIC METABOLIC PANEL
Anion gap: 10 (ref 5–15)
BUN: 10 mg/dL (ref 8–23)
CO2: 27 mmol/L (ref 22–32)
Calcium: 8.8 mg/dL — ABNORMAL LOW (ref 8.9–10.3)
Chloride: 105 mmol/L (ref 98–111)
Creatinine, Ser: 1.35 mg/dL — ABNORMAL HIGH (ref 0.61–1.24)
GFR, Estimated: 52 mL/min — ABNORMAL LOW (ref >60)
Glucose, Bld: 96 mg/dL (ref 70–99)
Potassium: 3.6 mmol/L (ref 3.5–5.1)
Sodium: 142 mmol/L (ref 135–145)

## 2023-07-05 LAB — BRAIN NATRIURETIC PEPTIDE: B Natriuretic Peptide: 228.7 pg/mL — ABNORMAL HIGH (ref 0.0–100.0)

## 2023-07-05 LAB — TROPONIN I (HIGH SENSITIVITY)
Troponin I (High Sensitivity): 22 ng/L — ABNORMAL HIGH (ref <18)
Troponin I (High Sensitivity): 19 ng/L — ABNORMAL HIGH (ref <18)

## 2023-07-05 LAB — LACTIC ACID, PLASMA
Lactic Acid, Venous: 1.5 mmol/L (ref 0.5–1.9)
Lactic Acid, Venous: 1 mmol/L (ref 0.5–1.9)

## 2023-07-05 LAB — MRSA NEXT GEN BY PCR, NASAL: MRSA by PCR Next Gen: NOT DETECTED

## 2023-07-05 MED ORDER — SODIUM CHLORIDE 0.9 % IV SOLN: 3.0000 g | Freq: Four times a day (QID) | INTRAVENOUS | Status: DC

## 2023-07-05 MED ORDER — AZITHROMYCIN 500 MG IV SOLR
500.0000 mg | Freq: Once | INTRAVENOUS | Status: DC
Start: 2023-07-05 — End: 2023-07-05

## 2023-07-05 MED ORDER — SODIUM CHLORIDE 0.9 % IV SOLN: INTRAVENOUS | Status: DC

## 2023-07-05 MED ORDER — MORPHINE SULFATE (PF) 2 MG/ML IV SOLN: 1.0000 mg | INTRAVENOUS | Status: DC | PRN

## 2023-07-05 MED ORDER — SODIUM CHLORIDE 0.9 % IV SOLN
500.0000 mg | INTRAVENOUS | Status: DC
  Administered 2023-07-05: 500 mg via INTRAVENOUS
  Filled 2023-07-05: qty 5

## 2023-07-05 MED ORDER — APIXABAN 2.5 MG PO TABS
2.5000 mg | ORAL_TABLET | Freq: Two times a day (BID) | ORAL | Status: DC
  Administered 2023-07-05 – 2023-07-11 (×13): 2.5 mg via ORAL
  Filled 2023-07-05 (×13): qty 1

## 2023-07-05 MED ORDER — FERROUS SULFATE 325 (65 FE) MG PO TABS
325.0000 mg | ORAL_TABLET | Freq: Every day | ORAL | Status: DC
  Administered 2023-07-05: 325 mg via ORAL
  Filled 2023-07-05: qty 1

## 2023-07-05 MED ORDER — SODIUM CHLORIDE 0.9% FLUSH
3.0000 mL | Freq: Two times a day (BID) | INTRAVENOUS | Status: DC
  Administered 2023-07-05 – 2023-07-11 (×13): 3 mL via INTRAVENOUS

## 2023-07-05 MED ORDER — PREDNISONE 10 MG PO TABS
5.0000 mg | ORAL_TABLET | Freq: Every day | ORAL | Status: DC
  Administered 2023-07-05 – 2023-07-11 (×7): 5 mg via ORAL
  Filled 2023-07-05 (×7): qty 1

## 2023-07-05 MED ORDER — CEFTRIAXONE SODIUM 1 G IJ SOLR
1.0000 g | INTRAMUSCULAR | Status: DC
Start: 2023-07-05 — End: 2023-07-05

## 2023-07-05 MED ORDER — IOHEXOL 350 MG/ML SOLN
75.0000 mL | Freq: Once | INTRAVENOUS | Status: AC | PRN
  Administered 2023-07-05: 75 mL via INTRAVENOUS

## 2023-07-05 MED ORDER — CARVEDILOL 25 MG PO TABS
25.0000 mg | ORAL_TABLET | Freq: Two times a day (BID) | ORAL | Status: DC
  Administered 2023-07-05 – 2023-07-11 (×13): 25 mg via ORAL
  Filled 2023-07-05 (×10): qty 1
  Filled 2023-07-05: qty 2
  Filled 2023-07-05 (×2): qty 1

## 2023-07-05 MED ORDER — HYDRALAZINE HCL 50 MG PO TABS
50.0000 mg | ORAL_TABLET | Freq: Three times a day (TID) | ORAL | Status: DC
  Administered 2023-07-05 – 2023-07-11 (×20): 50 mg via ORAL
  Filled 2023-07-05 (×20): qty 1

## 2023-07-05 MED ORDER — TRAMADOL HCL 50 MG PO TABS: 50.0000 mg | ORAL_TABLET | Freq: Three times a day (TID) | ORAL | Status: DC | PRN

## 2023-07-05 MED ORDER — OXYCODONE HCL 5 MG PO TABS: 5.0000 mg | ORAL_TABLET | ORAL | Status: DC | PRN

## 2023-07-05 MED ORDER — LOSARTAN POTASSIUM 50 MG PO TABS
100.0000 mg | ORAL_TABLET | Freq: Every day | ORAL | Status: DC
  Administered 2023-07-05 – 2023-07-11 (×7): 100 mg via ORAL
  Filled 2023-07-05 (×7): qty 2

## 2023-07-05 MED ORDER — SODIUM CHLORIDE 0.9 % IV SOLN: 1.0000 g | Freq: Once | INTRAVENOUS | Status: DC

## 2023-07-05 MED ORDER — ONDANSETRON HCL 4 MG PO TABS: 4.0000 mg | ORAL_TABLET | Freq: Four times a day (QID) | ORAL | Status: DC | PRN

## 2023-07-05 MED ORDER — GABAPENTIN 300 MG PO CAPS
300.0000 mg | ORAL_CAPSULE | Freq: Three times a day (TID) | ORAL | Status: DC
  Administered 2023-07-05 – 2023-07-11 (×19): 300 mg via ORAL
  Filled 2023-07-05 (×19): qty 1

## 2023-07-05 MED ORDER — ACETAMINOPHEN 650 MG RE SUPP: 650.0000 mg | Freq: Four times a day (QID) | RECTAL | Status: DC | PRN

## 2023-07-05 MED ORDER — OXYCODONE HCL 5 MG PO TABS
5.0000 mg | ORAL_TABLET | ORAL | Status: DC | PRN
  Administered 2023-07-05 (×2): 10 mg via ORAL
  Filled 2023-07-05 (×2): qty 2

## 2023-07-05 MED ORDER — SODIUM CHLORIDE 0.9 % IV SOLN: 2.0000 g | INTRAVENOUS | Status: DC

## 2023-07-05 MED ORDER — LEVOTHYROXINE SODIUM 50 MCG PO TABS
50.0000 ug | ORAL_TABLET | Freq: Every day | ORAL | Status: DC
  Administered 2023-07-06 – 2023-07-11 (×6): 50 ug via ORAL
  Filled 2023-07-05 (×6): qty 1

## 2023-07-05 MED ORDER — ALLOPURINOL 100 MG PO TABS
50.0000 mg | ORAL_TABLET | Freq: Every day | ORAL | Status: DC
  Administered 2023-07-05 – 2023-07-11 (×7): 50 mg via ORAL
  Filled 2023-07-05 (×7): qty 1

## 2023-07-05 MED ORDER — ACETAMINOPHEN 325 MG PO TABS: 650.0000 mg | ORAL_TABLET | Freq: Four times a day (QID) | ORAL | Status: DC | PRN

## 2023-07-05 MED ORDER — SODIUM CHLORIDE 0.9 % IV SOLN
1.0000 g | Freq: Once | INTRAVENOUS | Status: AC
  Administered 2023-07-05: 1 g via INTRAVENOUS
  Filled 2023-07-05: qty 10

## 2023-07-05 MED ORDER — ONDANSETRON HCL 4 MG/2ML IJ SOLN: 4.0000 mg | Freq: Four times a day (QID) | INTRAMUSCULAR | Status: DC | PRN

## 2023-07-05 NOTE — Progress Notes (Signed)
Pharmacy Antibiotic Note  Terry [name] is a 82 y.o. male admitted on 07/04/2023 with pneumonia.  Pharmacy has been consulted for Unasyn dosing.  Hx enterococcus UTI in 05/2023 sensitive to ampicillin CrCl 50-60 mL/min  Plan: Stop ceftriaxone Start Unasyn 3g Q6H  Height: 5\' 7"  (170.2 cm) Weight: 136.1 kg (300 lb) IBW/kg (Calculated) : 66.1  Temp (24hrs), Avg:98.1 F (36.7 C), Min:98 F (36.7 C), Max:98.1 F (36.7 C)  Recent Labs  Lab 06/29/23 1206 07/01/23 1242 07/05/23 0114 07/05/23 0136 07/05/23 0358  WBC 5.9 5.8 6.0  --   --   CREATININE 1.20 1.55* 1.35*  --   --   LATICACIDVEN  --   --   --  1.5 1.0    Estimated Creatinine Clearance: 56.2 mL/min (A) (by C-G formula based on SCr of 1.35 mg/dL (H)).    Allergies  Allergen Reactions   Lisinopril     FACIAL SWELLING     Thank you for allowing pharmacy to be a part of this patient's care.  Eldridge Scot, PharmD, BCCCP Clinical Pharmacist 07/05/2023, 9:02 AM

## 2023-07-05 NOTE — Progress Notes (Signed)
[facility] CSW Progress Note  Clinical Child psychotherapist  returned a call to pt's sister Misty Stanley.  Misty Stanley called to inquire about resources for pt as he has decompensated recently and may need either more assistance in the home or placement.    CSW instructed Misty Stanley to communicate with the inpatient team as pt is currently being admitted to [facility].  CSW also informed Dr. Mosetta Putt of admission and sister's concerns.  Dr. Mosetta Putt to see pt while hospitalized and speak with Misty Stanley regarding prognosis and life expectancy related to prostate cancer diagnosis.  CSW also provided Misty Stanley with contact information for [facility] at [employer] for Mom to discuss options.  CSW to remain available as appropriate.      Rachel Moulds, LCSW Clinical Social Worker [facility]

## 2023-07-05 NOTE — ED Notes (Signed)
ED TO INPATIENT HANDOFF REPORT  ED Nurse Name and Phone #: Marcelino Duster [phone]  S Name/Age/Gender Terry [name] 82 y.o. male Room/Bed: 041C/041C  Code Status   Code Status: Full Code  Home/SNF/Other Pt may need to go back to rehab d/t difficulty ambulating Patient oriented to: self, place, time, and situation Is this baseline? Yes   Triage Complete: Triage complete  Chief Complaint Left lower lobe pneumonia [J18.9]  Triage Note BIB [facility] from home. Pt slide out of his bed yesterday. Today pt is having L leg pain. EMS report pt was unable to ambulate on that leg. Pt is A/Ox4.   EMS Vitals   190/100 HR 72 SpO2 98% on R/A CBG 119   Allergies Allergies  Allergen Reactions   Lisinopril     FACIAL SWELLING    Level of Care/Admitting Diagnosis ED Disposition     ED Disposition  Admit   Condition  --   Comment  Hospital Area: [facility] [[id number]]  Level of Care: Med-Surg [[id number]]  May admit patient to [facility] or [facility] if equivalent level of care is available:: Yes  Covid Evaluation: Confirmed COVID Negative  Diagnosis: Left lower lobe pneumonia [536644]  Admitting Physician: Rodolph Bong [[id number]]  Attending Physician: Rodolph Bong [[id number]]  Certification:: I certify this patient will need inpatient services for at least 2 midnights  Expected Medical Readiness: 07/09/2023          B Medical/Surgery History Past Medical History:  Diagnosis Date   Arthritis    Cancer (HCC)    Phreesia 08/04/2020   CHF, chronic (HCC)    diastolic   CKD (chronic kidney disease) stage 3, GFR 30-59 ml/min (HCC)    Diabetes mellitus without complication (HCC)    Dysrhythmia    Hypertension    Hypothyroidism    Lymphedema    Paroxysmal atrial fibrillation (HCC)    Prostate cancer (HCC)    Renal insufficiency    Past Surgical History:  Procedure Laterality Date   COLON SURGERY N/A    Phreesia 08/04/2020     A IV  Location/Drains/Wounds Patient Lines/Drains/Airways Status     Active Line/Drains/Airways     Name Placement date Placement time Site Days   Peripheral IV 07/05/23 20 G Right Antecubital 07/05/23  0130  Antecubital  less than 1            Intake/Output Last 24 hours  Intake/Output Summary (Last 24 hours) at 07/05/2023 1042 Last data filed at 07/05/2023 0347 Gross per 24 hour  Intake 370.2 ml  Output --  Net 370.2 ml    Labs/Imaging Results for orders placed or performed during the hospital encounter of 07/04/23 (from the past 48 hour(s))  Urinalysis, Routine w reflex microscopic -Urine, Clean Catch     Status: Abnormal   Collection Time: 07/04/23 10:40 PM  Result Value Ref Range   Color, Urine YELLOW YELLOW   APPearance HAZY (A) CLEAR   Specific Gravity, Urine 1.009 1.005 - 1.030   pH 7.0 5.0 - 8.0   Glucose, UA NEGATIVE NEGATIVE mg/dL   Hgb urine dipstick MODERATE (A) NEGATIVE   Bilirubin Urine NEGATIVE NEGATIVE   Ketones, ur 5 (A) NEGATIVE mg/dL   Protein, ur NEGATIVE NEGATIVE mg/dL   Nitrite NEGATIVE NEGATIVE   Leukocytes,Ua LARGE (A) NEGATIVE   RBC / HPF 11-20 0 - 5 RBC/hpf   WBC, UA 21-50 0 - 5 WBC/hpf   Bacteria, UA FEW (A) NONE SEEN   Squamous Epithelial /  HPF 0-5 0 - 5 /HPF   Mucus PRESENT     Comment: Performed at [facility], [address], [place], [place] [address]  Basic metabolic panel     Status: Abnormal   Collection Time: 07/05/23  1:14 AM  Result Value Ref Range   Sodium 142 135 - 145 mmol/L   Potassium 3.6 3.5 - 5.1 mmol/L   Chloride 105 98 - 111 mmol/L   CO2 27 22 - 32 mmol/L   Glucose, Bld 96 70 - 99 mg/dL    Comment: Glucose reference range applies only to samples taken after fasting for at least 8 hours.   BUN 10 8 - 23 mg/dL   Creatinine, Ser 8.75 (H) 0.61 - 1.24 mg/dL   Calcium 8.8 (L) 8.9 - 10.3 mg/dL   GFR, Estimated 52 (L) >60 mL/min    Comment: (NOTE) Calculated using the CKD-EPI Creatinine Equation (2021)    Anion gap  10 5 - 15    Comment: Performed at [facility], [address] [place], [place] [address]  CBC with Differential     Status: Abnormal   Collection Time: 07/05/23  1:14 AM  Result Value Ref Range   WBC 6.0 4.0 - 10.5 K/uL   RBC 4.64 4.22 - 5.81 MIL/uL   Hemoglobin 11.2 (L) 13.0 - 17.0 g/dL   HCT 95.1 (L) 88.4 - 16.6 %   MCV 80.0 80.0 - 100.0 fL   MCH 24.1 (L) 26.0 - 34.0 pg   MCHC 30.2 30.0 - 36.0 g/dL   RDW 06.3 (H) 01.6 - 01.0 %   Platelets 168 150 - 400 K/uL   nRBC 0.0 0.0 - 0.2 %   Neutrophils Relative % 71 %   Neutro Abs 4.2 1.7 - 7.7 K/uL   Lymphocytes Relative 15 %   Lymphs Abs 0.9 0.7 - 4.0 K/uL   Monocytes Relative 12 %   Monocytes Absolute 0.7 0.1 - 1.0 K/uL   Eosinophils Relative 2 %   Eosinophils Absolute 0.1 0.0 - 0.5 K/uL   Basophils Relative 0 %   Basophils Absolute 0.0 0.0 - 0.1 K/uL   Immature Granulocytes 0 %   Abs Immature Granulocytes 0.02 0.00 - 0.07 K/uL    Comment: Performed at [facility], [address], [place], [place] [address]  Brain natriuretic peptide     Status: Abnormal   Collection Time: 07/05/23  1:14 AM  Result Value Ref Range   B Natriuretic Peptide 228.7 (H) 0.0 - 100.0 pg/mL    Comment: Performed at [facility], [address] [place], [place] [address]  Troponin I (High Sensitivity)     Status: Abnormal   Collection Time: 07/05/23  1:14 AM  Result Value Ref Range   Troponin I (High Sensitivity) 22 (H) <18 ng/L    Comment: (NOTE) Elevated high sensitivity troponin I (hsTnI) values and significant  changes across serial measurements may suggest ACS but many other  chronic and acute conditions are known to elevate hsTnI results.  Refer to the "Links" section for chest pain algorithms and additional  guidance. Performed at [facility], [address], [place], [place] [address]   Resp panel by RT-PCR (RSV, Flu A&B, Covid) Anterior Nasal Swab     Status: None   Collection Time: 07/05/23  1:20 AM    Specimen: Anterior Nasal Swab  Result Value Ref Range   SARS Coronavirus 2 by RT PCR NEGATIVE NEGATIVE  Influenza A by PCR NEGATIVE NEGATIVE   Influenza B by PCR NEGATIVE NEGATIVE    Comment: (NOTE) The Xpert Xpress SARS-CoV-2/FLU/RSV plus assay is intended as an aid in the diagnosis of influenza from Nasopharyngeal swab specimens and should not be used as a sole basis for treatment. Nasal washings and aspirates are unacceptable for Xpert Xpress SARS-CoV-2/FLU/RSV testing.  Fact Sheet for Patients: BloggerCourse.com  Fact Sheet for Healthcare Providers: SeriousBroker.it  This test is not yet approved or cleared by the [place] FDA and has been authorized for detection and/or diagnosis of SARS-CoV-2 by FDA under an Emergency Use Authorization (EUA). This EUA will remain in effect (meaning this test can be used) for the duration of the COVID-19 declaration under Section 564(b)(1) of the Act, 21 U.S.C. section 360bbb-3(b)(1), unless the authorization is terminated or revoked.     Resp Syncytial Virus by PCR NEGATIVE NEGATIVE    Comment: (NOTE) Fact Sheet for Patients: BloggerCourse.com  Fact Sheet for Healthcare Providers: SeriousBroker.it  This test is not yet approved or cleared by the [place] FDA and has been authorized for detection and/or diagnosis of SARS-CoV-2 by FDA under an Emergency Use Authorization (EUA). This EUA will remain in effect (meaning this test can be used) for the duration of the COVID-19 declaration under Section 564(b)(1) of the Act, 21 U.S.C. section 360bbb-3(b)(1), unless the authorization is terminated or revoked.  Performed at [facility], [address] [place], [place] [address]   Lactic acid, plasma     Status: None   Collection Time: 07/05/23  1:36 AM  Result Value Ref Range   Lactic Acid, Venous 1.5 0.5 - 1.9 mmol/L     Comment: Performed at [facility], [address], [place], [place] [address]  Blood culture (routine x 2)     Status: None (Preliminary result)   Collection Time: 07/05/23  1:36 AM   Specimen: BLOOD LEFT HAND  Result Value Ref Range   Specimen Description BLOOD LEFT HAND    Special Requests      BOTTLES DRAWN AEROBIC AND ANAEROBIC Blood Culture adequate volume Performed at [facility], [address], [place], [place] [address]    Culture PENDING    Report Status PENDING   Troponin I (High Sensitivity)     Status: Abnormal   Collection Time: 07/05/23  3:58 AM  Result Value Ref Range   Troponin I (High Sensitivity) 19 (H) <18 ng/L    Comment: (NOTE) Elevated high sensitivity troponin I (hsTnI) values and significant  changes across serial measurements may suggest ACS but many other  chronic and acute conditions are known to elevate hsTnI results.  Refer to the "Links" section for chest pain algorithms and additional  guidance. Performed at [facility], [address], [place], [place] [address]   Lactic acid, plasma     Status: None   Collection Time: 07/05/23  3:58 AM  Result Value Ref Range   Lactic Acid, Venous 1.0 0.5 - 1.9 mmol/L    Comment: Performed at [facility], [address] [place], [place] [address]   CT ABDOMEN PELVIS W CONTRAST  Result Date: 07/05/2023 CLINICAL DATA:  82 year old male with history of acute onset of nonlocalized abdominal pain. History of metastatic prostate cancer. * Tracking Code: BO * EXAM: CT ABDOMEN AND PELVIS WITH CONTRAST TECHNIQUE: Multidetector CT imaging of the abdomen and pelvis was performed using the standard protocol following bolus administration of intravenous contrast. RADIATION  DOSE REDUCTION: This exam was performed according to the departmental dose-optimization program which includes automated exposure control, adjustment of the mA and/or kV according to patient size and/or use of iterative  reconstruction technique. CONTRAST:  75mL OMNIPAQUE IOHEXOL 350 MG/ML SOLN COMPARISON:  CT the abdomen and pelvis 09/30/2021. PET-CT 01/13/2023. FINDINGS: Lower chest: Unremarkable. Hepatobiliary: Subcentimeter low-attenuation lesion in segment 4B of the liver adjacent to the falciform ligament, too small to definitively characterize, but similar to prior studies and statistically likely a cyst (no imaging follow-up recommended). No other suspicious appearing hepatic lesions are noted. No intra or extrahepatic biliary ductal dilatation. Faint densities lie dependently in the gallbladder, likely biliary sludge balls or noncalcified gallstones. Gallbladder is nondistended. No pericholecystic fluid or surrounding inflammatory changes. Pancreas: No pancreatic mass. No pancreatic ductal dilatation. No pancreatic or peripancreatic fluid collections or inflammatory changes. Spleen: Unremarkable. Adrenals/Urinary Tract: Severe atrophy of the right kidney again noted. Right-sided double-J ureteral stent in position with proximal loop reformed in the right renal pelvis and distal loop reformed in the urinary bladder. Severe chronic hydroureteronephrosis is minimally increased compared to prior study, terminating in the mid ureter where there is a 9 mm calculus adjacent to the stent (axial image 52 of series 3). Several curvilinear calcifications are also noted in the lower pole of the right kidney, some of which may represent calculi, while others may represent parenchymal calcifications. Left kidney and bilateral adrenal glands are normal in appearance. Urinary bladder is otherwise unremarkable in appearance. Stomach/Bowel: The appearance of the stomach is normal. There is no pathologic dilatation of small bowel or colon. Normal appendix. Vascular/Lymphatic: No significant atherosclerotic disease, aneurysm or dissection noted in the abdominal or pelvic vasculature. Multiple prominent borderline enlarged and mildly enlarged  retroperitoneal and pelvic lymph nodes are noted. Clear enlargement of a left para-aortic lymph node adjacent to the left renal hilum (axial image 36 of series 3) which currently measures 1.7 cm in short axis (previously nonenlarged), concerning for metastatic disease. Other prominent retrocaval lymph node (axial image 33 of series 3) measuring 1.1 cm in short axis, and left inguinal lymph node (axial image 78 of series 3) measuring 11 mm in short axis also noted. Reproductive: Prostate gland and seminal vesicles are grossly unremarkable in appearance. Other: No significant volume of ascites.  No pneumoperitoneum. Musculoskeletal: Several sclerotic lesions are again noted in the visualized axial and appendicular skeleton, indicative of metastatic disease to the bones in this patient with history of prostate cancer. This appears progressive, with enlargement of several lesions (best example is in the right side of S1 where there is a 1.9 cm sclerotic lesion which previously measured only 1 cm). The largest lesion is in the left side of the L5 vertebral body (axial image 49 of series 3) currently measuring 4.2 x 3.5 cm. IMPRESSION: 1. 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 2. No other definite acute findings noted in the abdomen or pelvis to account for the patient's symptoms. 3. 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 Increasing lymphadenopathy, most evident in the retroperitoneum also suspicious for metastatic disease in this patient with documented history of prostate cancer. 4. Probable noncalcified gallstones or biliary sludge balls in the gallbladder. No findings to suggest an acute cholecystitis at this time. 5. Additional incidental findings, as above. Electronically Signed   By: Brayton Mars.D.  On:  07/05/2023 05:24   DG Knee 2 Views Left  Result Date: 07/05/2023 CLINICAL DATA:  Fall, left knee pain EXAM: LEFT KNEE - 1-2 VIEW COMPARISON:  None Available. 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 Mild overall straightening. No acute fracture or dislocation. Moderate left knee effusion. Mild diffuse subcutaneous edema of the visualized left lower extremity. IMPRESSION: 1. Severe tricompartmental degenerative arthritis. 2. Moderate left knee effusion. Electronically Signed   By: Helyn Numbers M.D.   On: 07/05/2023 03:42   CT Head Wo Contrast  Result Date: 07/04/2023 CLINICAL DATA:  Fall, trauma. EXAM: CT HEAD WITHOUT CONTRAST TECHNIQUE: Contiguous axial images were obtained from the base of the skull through the vertex without intravenous contrast. RADIATION DOSE REDUCTION: This exam was performed according to the departmental dose-optimization program which includes automated exposure control, adjustment of the mA and/or kV according to patient size and/or use of iterative reconstruction technique. COMPARISON:  None Available. FINDINGS: Brain: No evidence of acute infarction, hemorrhage, hydrocephalus, extra-axial collection or mass lesion/mass effect. There is mild diffuse atrophy and mild periventricular white matter hypodensity. Vascular: Atherosclerotic calcifications are present within the cavernous internal carotid arteries. Skull: Normal. Negative for fracture or focal lesion. Sinuses/Orbits: No acute finding. Other: None. IMPRESSION: 1. No acute intracranial abnormality. 2. 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 Electronically Signed   By: Darliss Cheney M.D.   On: 07/04/2023 23:59   DG Chest 2 View  Result Date: 07/04/2023 CLINICAL DATA:  Cough and fall EXAM: CHEST - 2 VIEW COMPARISON:  Radiographs 05/31/2023 FINDINGS: Stable cardiomegaly. Posterior lower lobe consolidation on lateral view  compatible with pneumonia. No correlate is seen on AP view. No pleural effusion or pneumothorax. No displaced rib fractures. IMPRESSION: Lower lobe pneumonia seen on lateral view. Electronically Signed   By: Minerva Fester M.D.   On: 07/04/2023 23:51   DG Lumbar Spine Complete  Result Date: 07/04/2023 CLINICAL DATA:  Fall, back pain EXAM: LUMBAR SPINE - COMPLETE 4+ VIEW COMPARISON:  PET-CT 01/13/2023 and lumbar radiographs 09/24/2011 FINDINGS: Right double-J ureteral stent noted. Possible stones along the stent at the level of the iliac crest. Sclerosis in the lower lumbar spine compatible with metastatic lesions. Body habitus reduces diagnostic sensitivity and specificity. Poor cortical definition in some areas due to body habitus. Lower thoracic spondylosis. I do not see a definite fracture or acute subluxation. IMPRESSION: 1. No definite fracture or acute subluxation. Substantially reduced sensitivity due to body habitus, consider CT if there is a high clinical index of suspicion. 2. Sclerosis in the lower lumbar spine compatible with metastatic lesions. 3. Right double-J ureteral stent with possible stones along the stent at the level of the iliac crest. Electronically Signed   By: Gaylyn Rong M.D.   On: 07/04/2023 16:36   DG Hip Unilat With Pelvis 2-3 Views Left  Result Date: 07/04/2023 CLINICAL DATA:  Fall, injury EXAM: DG HIP (WITH OR WITHOUT PELVIS) 2-3V LEFT COMPARISON:  PET-CT 01/13/2023 FINDINGS: Distal portion of a right double-J ureteral stent is noted with loop formed in the urinary bladder. Cannot exclude stones or concretions adjacent to the tube at the level of the right iliac crest. Sclerotic lesions in the pelvis, most are plea notable in the right ischium, compatible with metastatic lesions. No fracture or acute bony findings identified. IMPRESSION: 1. No acute bony findings. 2. 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 3. Right double-J ureteral stent in  place. Cannot  exclude stones or concretions adjacent to the tube at the vertical level of the right iliac crest. Electronically Signed   By: Gaylyn Rong M.D.   On: 07/04/2023 16:33    Pending Labs Unresulted Labs (From admission, onward)     Start     Ordered   07/06/23 0500  Basic metabolic panel  Tomorrow morning,   R        07/05/23 1040   07/06/23 0500  CBC  Tomorrow morning,   R        07/05/23 1040   07/05/23 0810  Urine Culture (for pregnant, neutropenic or urologic patients or patients with an indwelling urinary catheter)  (Urine Labs)  Once,   R       Question:  Indication  Answer:  Dysuria   07/05/23 0809   07/05/23 0110  Blood culture (routine x 2)  BLOOD CULTURE X 2,   R (with STAT occurrences)      07/05/23 0109   07/04/23 2239  Gastrointestinal Panel by PCR , Stool  (Gastrointestinal Panel by PCR, Stool                                                                                                                                                     **Does Not include CLOSTRIDIUM DIFFICILE testing. **If CDIFF testing is needed, place order from the "C Difficile Testing" order set.**)  Once,   URGENT        07/04/23 2238   07/04/23 2239  C Difficile Quick Screen w PCR reflex  (C Difficile quick screen w PCR reflex panel )  Once, for 24 hours,   URGENT       References:    CDiff Information Tool   07/04/23 2238            Vitals/Pain Today's Vitals   07/05/23 0907 07/05/23 1024 07/05/23 1030 07/05/23 1040  BP:  (!) 202/81 (!) 203/86 (!) 179/66  Pulse:   79 72  Resp:  20 20   Temp: 98.1 F (36.7 C) 98.4 F (36.9 C)    TempSrc: Oral Oral    SpO2:   100% 100%  Weight:      Height:      PainSc:  8       Isolation Precautions Contact precautions  Medications Medications  azithromycin (ZITHROMAX) 500 mg in sodium chloride 0.9 % 250 mL IVPB (0 mg Intravenous Stopped 07/05/23 0626)  0.9 %  sodium chloride infusion (has no administration in time range)   allopurinol (ZYLOPRIM) tablet 50 mg (50 mg Oral Given 07/05/23 1019)  traMADol (ULTRAM) tablet 50 mg (has no administration in time range)  carvedilol (COREG) tablet 25 mg (25 mg Oral Given 07/05/23 1019)  hydrALAZINE (APRESOLINE) tablet 50 mg (50 mg Oral Given 07/05/23 1019)  losartan (COZAAR) tablet 100 mg (100  mg Oral Given 07/05/23 1018)  levothyroxine (SYNTHROID) tablet 50 mcg (has no administration in time range)  predniSONE (DELTASONE) tablet 5 mg (5 mg Oral Given 07/05/23 1020)  apixaban (ELIQUIS) tablet 2.5 mg (2.5 mg Oral Given 07/05/23 1018)  gabapentin (NEURONTIN) capsule 300 mg (300 mg Oral Given 07/05/23 1018)  ferrous sulfate tablet 325 mg (325 mg Oral Given 07/05/23 1020)  oxyCODONE (Oxy IR/ROXICODONE) immediate release tablet 5-10 mg (10 mg Oral Given 07/05/23 1020)  morphine (PF) 2 MG/ML injection 1 mg (has no administration in time range)  Ampicillin-Sulbactam (UNASYN) 3 g in sodium chloride 0.9 % 100 mL IVPB (has no administration in time range)  sodium chloride flush (NS) 0.9 % injection 3 mL (has no administration in time range)  acetaminophen (TYLENOL) tablet 650 mg (has no administration in time range)    Or  acetaminophen (TYLENOL) suppository 650 mg (has no administration in time range)  ondansetron (ZOFRAN) tablet 4 mg (has no administration in time range)    Or  ondansetron (ZOFRAN) injection 4 mg (has no administration in time range)  oxyCODONE-acetaminophen (PERCOCET/ROXICET) 5-325 MG per tablet 1 tablet (1 tablet Oral Given 07/04/23 1428)  cefTRIAXone (ROCEPHIN) 1 g in sodium chloride 0.9 % 100 mL IVPB (0 g Intravenous Stopped 07/05/23 0441)  iohexol (OMNIPAQUE) 350 MG/ML injection 75 mL (75 mLs Intravenous Contrast Given 07/05/23 0433)    Mobility Able to transfer to [facility] x2 assist, pain with bearing weight.      Focused Assessments Pulmonary Assessment Handoff:  Lung sounds:   Diminished.   +DOE  O2 Device: Room Air  Vascular:   Swelling noted to BLE, dry  scaly skin.  Musc/Skel:   Pain to left leg  GU/GI: Patient uses urinal, patient did have small BM stool soft and formed.  R Recommendations: See Admitting Provider Note  Report given to:   Additional Notes:   Patient did have small BM, stool soft and formed could not send for c-diff.

## 2023-07-05 NOTE — H&P (Addendum)
History and Physical    Patient: Terry [name] BMW:[id number] DOB: 1941-05-04 DOA: 07/04/2023 DOS: the patient was seen and examined on 07/05/2023 PCP: Donita Brooks, MD  Patient coming from: Home-daughter and son have been living with him since discharge from SNF 5 days prior  Chief Complaint:  Chief Complaint  Patient presents with   Fall   HPI: Terry [name] is an 82 y.o. male with a history of metastatic prostate cancer, iron deficiency anemia, CKD 3, HTN, PAF on Eliquis, diastolic CHF, chronic R ureteral stone, and hypothyroidism who presented to the ER via EMS with left leg pain.  He reportedly slid out of his bed 24 hours prior.  He was unable to ambulate due to the pain, and generalized weakness.  Blood pressure was elevated at 190/100 at presentation.  He reported persistent severe deconditioning and an inability to care for himself since the time of his discharge from a skilled nursing facility about 5 days before.  He further reported diarrhea that began while in the skilled nursing facility.  He denied dark or bloody stools or bloody emesis.  .  In the ER he was afebrile. Room air sats were stable at 98 to 100%.  Lactic acid was normal.  No leukocytosis.  Hemoglobin stable.  PCR for flu, RSV and COVID were negative. Urinalysis was abnormal with a large amount of leukocytes, negative nitrite, few bacteria, squamous epithelial 0-5, WBCs 21-50 with moderate hemoglobin and 11-20 RBCs, but he had no specific complains of dysuria.  Creatinine at baseline of 1.35.  Chest x-ray was interpreted by the Radiologist as revealing a left lower lobe pneumonia, but the patient had no clinical symptoms c/w pneumonia.  The patient did c/o left knee pain and swelling and an inability to weight-bear on same leg.  Imaging revealed severe tricompartmental degenerative arthritis as well as some moderate left knee effusion.  CT abdomen and pelvis revealed severe chronic right-sided hydroureteronephrosis and  severe right renal atrophy despite indwelling right ureteral stent with a 9 mm calculus in the right mid ureter adjacent to the stent.  CT also confirmed widespread metastatic disease to the bones progressive when compared to a prior study with increasing lymphadenopathy.    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 Mosetta Putt Goals of care discussions clearly need to be carried out - Palliative Care consulted   Diarrhea Patient states is improving Given recent stay at skilled nursing facility will check for C. 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 All other systems reviewed and are negative. Past Medical History:  Diagnosis Date   Arthritis    Cancer (HCC)    [employer]reesia 08/04/2020   CHF, chronic (HCC)    diastolic   CKD (chronic kidney disease) stage 3, GFR 30-59 ml/min (HCC)    Diabetes mellitus without complication (HCC)    Dysrhythmia    Hypertension    Hypothyroidism    Lymphedema    Paroxysmal atrial fibrillation (HCC)    Prostate cancer [facility])    Renal insufficiency    Past Surgical History:  Procedure Laterality Date   COLON SURGERY N/A    [employer] 08/04/2020   Social History:  reports that he has never smoked. He has never been exposed to tobacco smoke. He has never used smokeless tobacco. He reports that he does not currently use alcohol. He reports that he does not currently use drugs after having used the following drugs: Marijuana.  Allergies  Allergen Reactions   Lisinopril     FACIAL SWELLING    Family History  Problem Relation Age of Onset   Hypertension Mother    Cancer Father 60       gastric cancer   Cancer Paternal Aunt        unknown type cancer   Diabetes Son     Prior to Admission medications   Medication Sig Start Date End Date Taking? Authorizing Provider  allopurinol (ZYLOPRIM) 100 MG tablet Take 0.5 tablets (50 mg total) by mouth daily. 07/22/22  Yes Donita Brooks, MD  apixaban (ELIQUIS) 2.5 MG TABS tablet Take 1 tablet (2.5 mg total) by mouth 2 (two) times daily. 07/22/22  Yes Donita Brooks, MD  carvedilol (COREG) 25 MG tablet Take 25 mg by mouth 2 (two) times  daily. 02/15/23  Yes [provider]  gabapentin (NEURONTIN) 300 MG capsule Take 1 capsule (300 mg total) by mouth 3 times/day as needed-between meals & bedtime. Patient taking differently: Take 300 mg by mouth 3 (three) times daily. 01/10/23  Yes Donita Brooks, MD  hydrALAZINE (APRESOLINE) 50 MG tablet Take 1 tablet (50 mg total) by mouth every 8 (eight) hours. 06/06/23 07/06/23 Yes Pahwani, Daleen Bo, MD  Iron, Ferrous Sulfate, 325 (65 Fe) MG TABS Take 325 mg by mouth daily. 07/16/22  Yes Donita Brooks, MD  levothyroxine (SYNTHROID) 50 MCG tablet Take 1 tablet (50 mcg total) by mouth daily. 12/24/22  Yes Donita Brooks, MD  losartan (COZAAR) 100 MG tablet Take 1 tablet (100 mg total) by mouth daily. 06/07/23  Yes Pahwani, Daleen Bo, MD  predniSONE (DELTASONE) 5 MG tablet Take 1 tablet (5 mg total) by mouth daily with breakfast. 01/17/23  Yes Malachy Mood, MD  traMADol (ULTRAM) 50 MG tablet Take 1 tablet (50 mg total) by mouth every 8 (eight) hours  as needed. 06/04/23 07/04/24 Yes Pahwani, Daleen Bo, MD  VITAMIN D PO Take 1 tablet by mouth daily.   Yes [provider]  VITAMIN E PO Take 1 tablet by mouth daily.   Yes [provider]  abiraterone acetate (ZYTIGA) 250 MG tablet Take 4 tablets (1,000 mg total) by mouth daily. Take on an empty stomach 1 hour before or 2 hours after a meal Patient not taking: Reported on 07/05/2023 03/18/23   Malachy Mood, MD    Physical Exam: Vitals:   07/05/23 0907 07/05/23 1024 07/05/23 1030 07/05/23 1040  BP:  (!) 202/81 (!) 203/86 (!) 179/66  Pulse:   79 72  Resp:  20 20   Temp: 98.1 F (36.7 C) 98.4 F (36.9 C)    TempSrc: Oral Oral    SpO2:   100% 100%  Weight:      Height:       Constitutional: NAD, calm, comfortable Respiratory: clear to auscultation bilaterally although diminished in the bases bilaterally, no wheezing, no crackles. Normal respiratory effort. No accessory muscle use.  Cardiovascular: Regular rate and rhythm, no murmurs / rubs /  gallops. No extremity edema. 2+ pedal pulses.  Marked bilateral lower extremity lymphedema Abdomen: no tenderness, no masses palpated. No obvious hepatosplenomegaly.  Abdomen soft and distended with hyperactive bowel sounds.   Musculoskeletal: no clubbing / cyanosis. Good ROM, no contractures.  Swelling left knee consistent with effusion seen on imaging. Skin: no rashes, lesions, ulcers. No induration Neurologic: CN 2-12 grossly intact. Sensation intact, DTR normal. Strength 3/5 x all 4 extremities.  Bilateral extremity weakness seems to be more reflective of deconditioning and not a true neurological deficit. Psychiatric: Normal judgment and insight. Alert and oriented x 3. Normal mood.    Data Reviewed: All labs and imaging studies obtained in ED have been reviewed in detail.   Severity of Illness: The appropriate patient status for this patient is INPATIENT. Inpatient status is judged to be reasonable and necessary in order to provide the required intensity of service to ensure the patient's safety. The patient's presenting symptoms, physical exam findings, and initial radiographic and laboratory data in the context of their chronic comorbidities is felt to place them at high risk for further clinical deterioration. Furthermore, it is not anticipated that the patient will be medically stable for discharge from the hospital within 2 midnights of admission.   * I certify that at the point of admission it is my clinical judgment that the patient will require inpatient hospital care spanning beyond 2 midnights from the point of admission due to high intensity of service, high risk for further deterioration and high frequency of surveillance required.*  Author: Junious Silk, NP 07/05/2023 10:51 AM  I have independently interviewed and examined this patient and discussed his care with Junious Silk.  I reviewed his admitting laboratories and various radiologic studies.  I have made extensive  editorial changes to the above note so that it reflects my impression plan and exam.  Lonia Blood, MD [facility] Office  [phone]

## 2023-07-05 NOTE — ED Notes (Signed)
This RN has asked phlebotomist to please attempt to draw this patient's labs.

## 2023-07-05 NOTE — Plan of Care (Signed)
Patient alert/oriented X4. Patient compliant with medication administration and was able to sit on the side of the bed. Patient is required a max +2 assist with standing. Patient complains of generalized pain. VSS, will continue to monitor.   Problem: Education: Goal: Knowledge of General Education information will improve Description: Including pain rating scale, medication(s)/side effects and non-pharmacologic comfort measures Outcome: Progressing   Problem: Health Behavior/Discharge Planning: Goal: Ability to manage health-related needs will improve Outcome: Progressing   Problem: Clinical Measurements: Goal: Ability to maintain clinical measurements within normal limits will improve Outcome: Progressing   Problem: Clinical Measurements: Goal: Will remain free from infection Outcome: Progressing   Problem: Clinical Measurements: Goal: Diagnostic test results will improve Outcome: Progressing   Problem: Clinical Measurements: Goal: Respiratory complications will improve Outcome: Progressing   Problem: Clinical Measurements: Goal: Cardiovascular complication will be avoided Outcome: Progressing   Problem: Activity: Goal: Risk for activity intolerance will decrease Outcome: Progressing   Problem: Nutrition: Goal: Adequate nutrition will be maintained Outcome: Progressing   Problem: Coping: Goal: Level of anxiety will decrease Outcome: Progressing   Problem: Elimination: Goal: Will not experience complications related to bowel motility Outcome: Progressing   Problem: Elimination: Goal: Will not experience complications related to urinary retention Outcome: Progressing   Problem: Pain Managment: Goal: General experience of comfort will improve Outcome: Progressing   Problem: Safety: Goal: Ability to remain free from injury will improve Outcome: Progressing   Problem: Skin Integrity: Goal: Risk for impaired skin integrity will decrease Outcome: Progressing

## 2023-07-05 NOTE — Evaluation (Addendum)
Physical Therapy Evaluation Patient Details Name: Terry [name] MRN: [id number] DOB: 1941-04-26 Today's Date: 07/05/2023  History of Present Illness  82 y.o. male presented by EMS after having left leg pain. He reportedly slid out of his bed 24 hours prior.  He was unable to ambulate due to the pain. Discharge from skilled nursing facility about 5 days before. 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  PMH significant of prostate cancer, iron deficiency anemia, CKD 3, hypertension, PAF on Eliquis, diastolic dysfunction (HFpEF),  and hypothyroidism.  Clinical Impression   Pt admitted secondary to problem above with deficits below. PTA patient was recently discharged from SNF to home with sons. He was able to walk up 1+1 steps to enter home and walk modified independent with rollator. Daughter notes that he "went downhill" once he got home.  Pt currently requires min assist to come to sit EOB. Unable to scoot forward to get feet on the floor with +1 assist and lack of bed pad to attempt transfer or gait. Pt also very sleepy with nurse reporting he was given morphine for his left knee pain. Discussed with daughter that current recommendation is for return to post-acute rehab <3 hours therapy per day.  Anticipate patient will benefit from PT to address problems listed below.Will continue to follow acutely to maximize functional mobility independence and safety.           If plan is discharge home, recommend the following: Two people to help with walking and/or transfers;Assistance with cooking/housework;Direct supervision/assist for medications management;Direct supervision/assist for financial management;Assist for transportation;Help with stairs or ramp for entrance   Can travel by private vehicle   No    Equipment Recommendations None recommended by PT  Recommendations for Other Services       Functional Status Assessment Patient has had a recent decline in  their functional status and demonstrates the ability to make significant improvements in function in a reasonable and predictable amount of time.     Precautions / Restrictions Precautions Precautions: Fall Precaution Comments: slid out of bed at home per chart Restrictions Weight Bearing Restrictions: No      Mobility  Bed Mobility Overal bed mobility: Needs Assistance Bed Mobility: Rolling, Supine to Sit, Sit to Sidelying Rolling: Min assist   Supine to sit: Min assist, HOB elevated, Used rails   Sit to sidelying: Min assist General bed mobility comments: pt required incr time and incr effort  to scoot out towards EOB (paper pads under pt on knit sheet)    Transfers                   General transfer comment: unable to scoot out enough to get feet to floor; pt falling asleep in sitting    Ambulation/Gait                  Stairs            Wheelchair Mobility     Tilt Bed    Modified Rankin (Stroke Patients Only)       Balance Overall balance assessment: Needs assistance Sitting-balance support: No upper extremity supported, Feet unsupported Sitting balance-Leahy Scale: Fair         Standing balance comment: unable                             Pertinent Vitals/Pain Pain Assessment Pain Assessment: No/denies pain    Home Living  Family/patient expects to be discharged to:: Private residence Living Arrangements: Children (son and son-in-law) Available Help at Discharge: Family;Available PRN/intermittently Type of Home: House Home Access: Stairs to enter Entrance Stairs-Rails: None Entrance Stairs-Number of Steps: 1+1 Alternate Level Stairs-Number of Steps: full flight but reports he has and uses stair chair lift Home Layout: Two level;Bed/bath upstairs;1/2 bath on main level Home Equipment: Cane - single point;Rollator (4 wheels);Hand held shower head;Tub bench      Prior Function Prior Level of Function : Needs  assist;History of Falls (last six months)             Mobility Comments: per daughter, when at SNF he was moving very well and using rollator; began going downhill after he went home       Extremity/Trunk Assessment   Upper Extremity Assessment Upper Extremity Assessment: Defer to OT evaluation    Lower Extremity Assessment Lower Extremity Assessment: Generalized weakness (incr bil LE edema (?baseline); pt <3/5 SLR, but also very sleepy)    Cervical / Trunk Assessment Cervical / Trunk Assessment: Other exceptions Cervical / Trunk Exceptions: morbid obesity  Communication   Communication Communication: No apparent difficulties Cueing Techniques: Verbal cues;Gestural cues;Tactile cues  Cognition Arousal: Obtunded, Suspect due to medications Behavior During Therapy: Flat affect Overall Cognitive Status: Difficult to assess                                 General Comments: alert when talking, however drifts off to sleep during mobility        General Comments General comments (skin integrity, edema, etc.): Daughter present    Exercises     Assessment/Plan    PT Assessment Patient needs continued PT services  PT Problem List Decreased strength;Decreased range of motion;Decreased balance;Decreased mobility;Decreased knowledge of use of DME;Obesity;Pain       PT Treatment Interventions DME instruction;Gait training;Stair training;Functional mobility training;Therapeutic activities;Therapeutic exercise;Balance training;Patient/family education    PT Goals (Current goals can be found in the Care Plan section)  Acute Rehab PT Goals Patient Stated Goal: unable PT Goal Formulation: Patient unable to participate in goal setting Time For Goal Achievement: 07/19/23 Potential to Achieve Goals: Good    Frequency Min 1X/week     Co-evaluation               AM-PAC PT "6 Clicks" Mobility  Outcome Measure Help needed turning from your back to your  side while in a flat bed without using bedrails?: A Little Help needed moving from lying on your back to sitting on the side of a flat bed without using bedrails?: A Lot (if bed flat and no rail) Help needed moving to and from a bed to a chair (including a wheelchair)?: Total Help needed standing up from a chair using your arms (e.g., wheelchair or bedside chair)?: Total Help needed to walk in hospital room?: Total Help needed climbing 3-5 steps with a railing? : Total 6 Click Score: 9    End of Session   Activity Tolerance: Patient limited by lethargy Patient left: in bed;with call bell/phone within reach;with family/visitor present Nurse Communication: Mobility status PT Visit Diagnosis: Other abnormalities of gait and mobility (R26.89);Muscle weakness (generalized) (M62.81)    Time: 3710-6269 PT Time Calculation (min) (ACUTE ONLY): 37 min   Charges:   PT Evaluation $PT Eval Moderate Complexity: 1 Mod PT Treatments $Therapeutic Activity: 8-22 mins PT General Charges $$ ACUTE PT VISIT: 1 Visit  Jerolyn Center, PT Acute Rehabilitation Services  Office [phone]   Zena Amos 07/05/2023, 2:24 PM

## 2023-07-05 NOTE — Plan of Care (Signed)
  Problem: Health Behavior/Discharge Planning: Goal: Ability to manage health-related needs will improve Outcome: Progressing   

## 2023-07-06 ENCOUNTER — Inpatient Hospital Stay (HOSPITAL_COMMUNITY): Payer: Medicare Other

## 2023-07-06 DIAGNOSIS — M7989 Other specified soft tissue disorders: Secondary | ICD-10-CM | POA: Diagnosis not present

## 2023-07-06 DIAGNOSIS — C61 Malignant neoplasm of prostate: Secondary | ICD-10-CM | POA: Diagnosis not present

## 2023-07-06 DIAGNOSIS — C7951 Secondary malignant neoplasm of bone: Secondary | ICD-10-CM

## 2023-07-06 DIAGNOSIS — R531 Weakness: Secondary | ICD-10-CM | POA: Diagnosis not present

## 2023-07-06 LAB — COMPREHENSIVE METABOLIC PANEL
ALT: 13 U/L (ref 0–44)
AST: 19 U/L (ref 15–41)
Albumin: 2.6 g/dL — ABNORMAL LOW (ref 3.5–5.0)
Alkaline Phosphatase: 52 U/L (ref 38–126)
Anion gap: 9 (ref 5–15)
BUN: 14 mg/dL (ref 8–23)
CO2: 26 mmol/L (ref 22–32)
Calcium: 7.8 mg/dL — ABNORMAL LOW (ref 8.9–10.3)
Chloride: 104 mmol/L (ref 98–111)
Creatinine, Ser: 1.5 mg/dL — ABNORMAL HIGH (ref 0.61–1.24)
GFR, Estimated: 46 mL/min — ABNORMAL LOW (ref >60)
Glucose, Bld: 95 mg/dL (ref 70–99)
Potassium: 3.7 mmol/L (ref 3.5–5.1)
Sodium: 139 mmol/L (ref 135–145)
Total Bilirubin: 0.6 mg/dL (ref 0.3–1.2)
Total Protein: 6.1 g/dL — ABNORMAL LOW (ref 6.5–8.1)

## 2023-07-06 LAB — MAGNESIUM: Magnesium: 2.1 mg/dL (ref 1.7–2.4)

## 2023-07-06 LAB — CORTISOL: Cortisol, Plasma: 2.8 ug/dL

## 2023-07-06 LAB — CBC
HCT: 35.7 % — ABNORMAL LOW (ref 39.0–52.0)
Hemoglobin: 10.9 g/dL — ABNORMAL LOW (ref 13.0–17.0)
MCH: 24.1 pg — ABNORMAL LOW (ref 26.0–34.0)
MCHC: 30.5 g/dL (ref 30.0–36.0)
MCV: 78.8 fL — ABNORMAL LOW (ref 80.0–100.0)
Platelets: 144 10*3/uL — ABNORMAL LOW (ref 150–400)
RBC: 4.53 MIL/uL (ref 4.22–5.81)
RDW: 18 % — ABNORMAL HIGH (ref 11.5–15.5)
WBC: 6.2 10*3/uL (ref 4.0–10.5)
nRBC: 0 % (ref 0.0–0.2)

## 2023-07-06 LAB — PROCALCITONIN: Procalcitonin: <0.1 ng/mL

## 2023-07-06 NOTE — Plan of Care (Signed)
  Problem: Health Behavior/Discharge Planning: Goal: Ability to manage health-related needs will improve Outcome: Progressing   

## 2023-07-06 NOTE — Progress Notes (Addendum)
Terry [name]   DOB:28-Apr-1941   ZO#:[id number]   JWJ#:[id number]  Med/onc follow up note   Subjective: Patient is well-known to me, under my care for his metastatic prostate cancer.  He was last seen in my office a week ago.  He was admitted to hospital a few days ago after he was not able to get up after a fall at home. He has been working with physical therapy in the hospital, and is likely going to rehab.  He reports progressive weakness since he was discharged from rehab a few weeks ago.   Objective:  Vitals:   07/06/23 1636 07/06/23 1958  BP: (!) 142/85 (!) 164/82  Pulse: 76 81  Resp: 18 17  Temp: 98.3 F (36.8 C) 98.8 F (37.1 C)  SpO2: 98% 98%    Body mass index is 48.62 kg/m.  Intake/Output Summary (Last 24 hours) at 07/06/2023 2031 Last data filed at 07/06/2023 0703 Gross per 24 hour  Intake 100 ml  Output 400 ml  Net -300 ml     Sclerae unicteric  Oropharynx clear  MSK no focal spinal tenderness, no peripheral edema  Neuro nonfocal    CBG (last 3)  No results for input(s): "GLUCAP" in the last 72 hours.   Labs:   Urine Studies No results for input(s): "UHGB", "CRYS" in the last 72 hours.  Invalid input(s): "UACOL", "UAPR", "USPG", "UPH", "UTP", "UGL", "UKET", "UBIL", "UNIT", "UROB", "ULEU", "UEPI", "UWBC", "URBC", "UBAC", "CAST", "UCOM", "BILUA"  Basic Metabolic Panel: Recent Labs  Lab 07/01/23 1242 07/05/23 0114 07/06/23 0239  NA 143 142 139  K 3.5 3.6 3.7  CL 110 105 104  CO2 22 27 26   GLUCOSE 95 96 95  BUN 15 10 14   CREATININE 1.55* 1.35* 1.50*  CALCIUM 8.9 8.8* 7.8*  MG  --   --  2.1   GFR Estimated Creatinine Clearance: 51.6 mL/min (A) (by C-G formula based on SCr of 1.5 mg/dL (H)). Liver Function Tests: Recent Labs  Lab 07/06/23 0239  AST 19  ALT 13  ALKPHOS 52  BILITOT 0.6  PROT 6.1*  ALBUMIN 2.6*   No results for input(s): "LIPASE", "AMYLASE" in the last 168 hours. No results for input(s): "AMMONIA" in the last 168  hours. Coagulation profile No results for input(s): "INR", "PROTIME" in the last 168 hours.  CBC: Recent Labs  Lab 07/01/23 1242 07/05/23 0114 07/06/23 0239  WBC 5.8 6.0 6.2  NEUTROABS 4,159 4.2  --   HGB 9.8* 11.2* 10.9*  HCT 32.2* 37.1* 35.7*  MCV 79.9* 80.0 78.8*  PLT 168 168 144*   Cardiac Enzymes: No results for input(s): "CKTOTAL", "CKMB", "CKMBINDEX", "TROPONINI" in the last 168 hours. BNP: Invalid input(s): "POCBNP" CBG: No results for input(s): "GLUCAP" in the last 168 hours. D-Dimer Recent Labs    07/05/23 1130  DDIMER 0.51*   Hgb A1c No results for input(s): "HGBA1C" in the last 72 hours. Lipid Profile No results for input(s): "CHOL", "HDL", "LDLCALC", "TRIG", "CHOLHDL", "LDLDIRECT" in the last 72 hours. Thyroid function studies No results for input(s): "TSH", "T4TOTAL", "T3FREE", "THYROIDAB" in the last 72 hours.  Invalid input(s): "FREET3" Anemia work up No results for input(s): "VITAMINB12", "FOLATE", "FERRITIN", "TIBC", "IRON", "RETICCTPCT" in the last 72 hours. Microbiology Recent Results (from the past 240 hour(s))  Resp panel by RT-PCR (RSV, Flu A&B, Covid) Anterior Nasal Swab     Status: None   Collection Time: 07/05/23  1:20 AM   Specimen: Anterior Nasal Swab  Result Value Ref Range  Status   SARS Coronavirus 2 by RT PCR NEGATIVE NEGATIVE Final   Influenza A by PCR NEGATIVE NEGATIVE Final   Influenza B by PCR NEGATIVE NEGATIVE Final    Comment: (NOTE) The Xpert Xpress SARS-CoV-2/FLU/RSV plus assay is intended as an aid in the diagnosis of influenza from Nasopharyngeal swab specimens and should not be used as a sole basis for treatment. Nasal washings and aspirates are unacceptable for Xpert Xpress SARS-CoV-2/FLU/RSV testing.  Fact Sheet for Patients: BloggerCourse.com  Fact Sheet for Healthcare Providers: SeriousBroker.it  This test is not yet approved or cleared by the [place] FDA  and has been authorized for detection and/or diagnosis of SARS-CoV-2 by FDA under an Emergency Use Authorization (EUA). This EUA will remain in effect (meaning this test can be used) for the duration of the COVID-19 declaration under Section 564(b)(1) of the Act, 21 U.S.C. section 360bbb-3(b)(1), unless the authorization is terminated or revoked.     Resp Syncytial Virus by PCR NEGATIVE NEGATIVE Final    Comment: (NOTE) Fact Sheet for Patients: BloggerCourse.com  Fact Sheet for Healthcare Providers: SeriousBroker.it  This test is not yet approved or cleared by the [place] FDA and has been authorized for detection and/or diagnosis of SARS-CoV-2 by FDA under an Emergency Use Authorization (EUA). This EUA will remain in effect (meaning this test can be used) for the duration of the COVID-19 declaration under Section 564(b)(1) of the Act, 21 U.S.C. section 360bbb-3(b)(1), unless the authorization is terminated or revoked.  Performed at [facility], [address] [place], [place] [address]   Blood culture (routine x 2)     Status: None (Preliminary result)   Collection Time: 07/05/23  1:36 AM   Specimen: BLOOD LEFT HAND  Result Value Ref Range Status   Specimen Description BLOOD LEFT HAND  Final   Special Requests   Final    BOTTLES DRAWN AEROBIC AND ANAEROBIC Blood Culture adequate volume   Culture   Final    NO GROWTH 1 DAY Performed at [facility], [address] [place], [place] [address]    Report Status PENDING  Incomplete  Blood culture (routine x 2)     Status: None (Preliminary result)   Collection Time: 07/05/23  1:44 AM   Specimen: BLOOD  Result Value Ref Range Status   Specimen Description BLOOD SITE NOT SPECIFIED  Final   Special Requests   Final    BOTTLES DRAWN AEROBIC AND ANAEROBIC Blood Culture results may not be optimal due to an excessive volume of blood received in culture bottles    Culture   Final    NO GROWTH 1 DAY Performed at [facility], [address] [place], [place] [address]    Report Status PENDING  Incomplete  MRSA Next Gen by PCR, Nasal     Status: None   Collection Time: 07/05/23 11:00 AM  Result Value Ref Range Status   MRSA by PCR Next Gen NOT DETECTED NOT DETECTED Final    Comment: (NOTE) The GeneXpert MRSA Assay (FDA approved for NASAL specimens only), is one component of a comprehensive MRSA colonization surveillance program. It is not intended to diagnose MRSA infection nor to guide or monitor treatment for MRSA infections. Test performance is not FDA approved in patients less than 15 years old. Performed at [facility], [address] [place], [place] [address]       Studies:  VAS [place] LOWER EXTREMITY VENOUS (DVT)  Result Date:  07/06/2023  Lower Venous DVT Study Patient Name:  KASIE BOMGARDNER  Date of Exam:   07/06/2023 Medical Rec #: [id number]      Accession #:    [id number] Date of Birth: January 12, 1941      Patient Gender: M Patient Age:   82 years Exam Location:  [facility] Procedure:      VAS [place] LOWER EXTREMITY VENOUS (DVT) Referring Phys: JEFFREY MCCLUNG --------------------------------------------------------------------------------  Indications: Swelling.  Risk Factors: Cancer -prostate. Anticoagulation: Eliquis. Limitations: Body habitus and poor ultrasound/tissue interface. Comparison Study: No previous study. Performing Technologist: McKayla Maag RVT, [place]  Examination Guidelines: A complete evaluation includes B-mode imaging, spectral Doppler, color Doppler, and power Doppler as needed of all accessible portions of each vessel. Bilateral testing is considered an integral part of a complete examination. Limited examinations for reoccurring indications may be performed as noted. The reflux portion of the exam is performed with the patient in reverse Trendelenburg.   +--------+---------------+---------+-----------+----------+--------------------+ RIGHT   CompressibilityPhasicitySpontaneityPropertiesThrombus Aging       +--------+---------------+---------+-----------+----------+--------------------+ CFV     Full           Yes      Yes                                       +--------+---------------+---------+-----------+----------+--------------------+ SFJ     Full                                                              +--------+---------------+---------+-----------+----------+--------------------+ FV Prox Full                                                              +--------+---------------+---------+-----------+----------+--------------------+ FV Mid  Full                                                              +--------+---------------+---------+-----------+----------+--------------------+ FV                     Yes      Yes                  patent by color      Distal                                               doppler              +--------+---------------+---------+-----------+----------+--------------------+ PFV     Full                                                              +--------+---------------+---------+-----------+----------+--------------------+  POP     Full           Yes      Yes                                       +--------+---------------+---------+-----------+----------+--------------------+ PTV     Full                                         Limited              +--------+---------------+---------+-----------+----------+--------------------+ PERO                                                 Not visualized       +--------+---------------+---------+-----------+----------+--------------------+   +--------+---------------+---------+-----------+----------+--------------------+ LEFT    CompressibilityPhasicitySpontaneityPropertiesThrombus Aging        +--------+---------------+---------+-----------+----------+--------------------+ CFV     Full           Yes      Yes                                       +--------+---------------+---------+-----------+----------+--------------------+ SFJ     Full                                                              +--------+---------------+---------+-----------+----------+--------------------+ FV Prox Full                                                              +--------+---------------+---------+-----------+----------+--------------------+ FV Mid  Full                                                              +--------+---------------+---------+-----------+----------+--------------------+ FV                     Yes      Yes                  patent by color      Distal                                               doppler              +--------+---------------+---------+-----------+----------+--------------------+ PFV     Full                                                              +--------+---------------+---------+-----------+----------+--------------------+  POP     Full           Yes      Yes                                       +--------+---------------+---------+-----------+----------+--------------------+ PTV                                                  Not visualized       +--------+---------------+---------+-----------+----------+--------------------+ PERO                                                 Not visualized       +--------+---------------+---------+-----------+----------+--------------------+     Summary: RIGHT: - There is no evidence of deep vein thrombosis in the lower extremity. However, portions of this examination were limited- see technologist comments above.  - No cystic structure found in the popliteal fossa.  LEFT: - There is no evidence of deep vein thrombosis in the lower extremity. However, portions of  this examination were limited- see technologist comments above.  - No cystic structure found in the popliteal fossa.  *See table(s) above for measurements and observations.    Preliminary    CT ABDOMEN PELVIS W CONTRAST  Result Date: 07/05/2023 CLINICAL DATA:  82 year old male with history of acute onset of nonlocalized abdominal pain. History of metastatic prostate cancer. * Tracking Code: BO * EXAM: CT ABDOMEN AND PELVIS WITH CONTRAST TECHNIQUE: Multidetector CT imaging of the abdomen and pelvis was performed using the standard protocol following bolus administration of intravenous contrast. RADIATION DOSE REDUCTION: This exam was performed according to the departmental dose-optimization program which includes automated exposure control, adjustment of the mA and/or kV according to patient size and/or use of iterative reconstruction technique. CONTRAST:  75mL OMNIPAQUE IOHEXOL 350 MG/ML SOLN COMPARISON:  CT the abdomen and pelvis 09/30/2021. PET-CT 01/13/2023. FINDINGS: Lower chest: Unremarkable. Hepatobiliary: Subcentimeter low-attenuation lesion in segment 4B of the liver adjacent to the falciform ligament, too small to definitively characterize, but similar to prior studies and statistically likely a cyst (no imaging follow-up recommended). No other suspicious appearing hepatic lesions are noted. No intra or extrahepatic biliary ductal dilatation. Faint densities lie dependently in the gallbladder, likely biliary sludge balls or noncalcified gallstones. Gallbladder is nondistended. No pericholecystic fluid or surrounding inflammatory changes. Pancreas: No pancreatic mass. No pancreatic ductal dilatation. No pancreatic or peripancreatic fluid collections or inflammatory changes. Spleen: Unremarkable. Adrenals/Urinary Tract: Severe atrophy of the right kidney again noted. Right-sided double-J ureteral stent in position with proximal loop reformed in the right renal pelvis and distal loop reformed in the  urinary bladder. Severe chronic hydroureteronephrosis is minimally increased compared to prior study, terminating in the mid ureter where there is a 9 mm calculus adjacent to the stent (axial image 52 of series 3). Several curvilinear calcifications are also noted in the lower pole of the right kidney, some of which may represent calculi, while others may represent parenchymal calcifications. Left kidney and bilateral adrenal glands are normal in appearance. Urinary bladder is otherwise unremarkable in appearance. Stomach/Bowel: The appearance of the stomach is normal. There is no  pathologic dilatation of small bowel or colon. Normal appendix. Vascular/Lymphatic: No significant atherosclerotic disease, aneurysm or dissection noted in the abdominal or pelvic vasculature. Multiple prominent borderline enlarged and mildly enlarged retroperitoneal and pelvic lymph nodes are noted. Clear enlargement of a left para-aortic lymph node adjacent to the left renal hilum (axial image 36 of series 3) which currently measures 1.7 cm in short axis (previously nonenlarged), concerning for metastatic disease. Other prominent retrocaval lymph node (axial image 33 of series 3) measuring 1.1 cm in short axis, and left inguinal lymph node (axial image 78 of series 3) measuring 11 mm in short axis also noted. Reproductive: Prostate gland and seminal vesicles are grossly unremarkable in appearance. Other: No significant volume of ascites.  No pneumoperitoneum. Musculoskeletal: Several sclerotic lesions are again noted in the visualized axial and appendicular skeleton, indicative of metastatic disease to the bones in this patient with history of prostate cancer. This appears progressive, with enlargement of several lesions (best example is in the right side of S1 where there is a 1.9 cm sclerotic lesion which previously measured only 1 cm). The largest lesion is in the left side of the L5 vertebral body (axial image 49 of series 3)  currently measuring 4.2 x 3.5 cm. IMPRESSION: 1. 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 2. No other definite acute findings noted in the abdomen or pelvis to account for the patient's symptoms. 3. 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 Increasing lymphadenopathy, most evident in the retroperitoneum also suspicious for metastatic disease in this patient with documented history of prostate cancer. 4. Probable noncalcified gallstones or biliary sludge balls in the gallbladder. No findings to suggest an acute cholecystitis at this time. 5. Additional incidental findings, as above. Electronically Signed   By: Trudie Reed M.D.   On: 07/05/2023 05:24   DG Knee 2 Views Left  Result Date: 07/05/2023 CLINICAL DATA:  Fall, left knee pain EXAM: LEFT KNEE - 1-2 VIEW COMPARISON:  None Available. 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 Mild overall straightening. No acute fracture or dislocation. Moderate left knee effusion. Mild diffuse subcutaneous edema of the visualized left lower extremity. IMPRESSION: 1. Severe tricompartmental degenerative arthritis. 2. Moderate left knee effusion. Electronically Signed   By: Helyn Numbers M.D.   On: 07/05/2023 03:42   CT Head Wo Contrast  Result Date: 07/04/2023 CLINICAL DATA:  Fall, trauma. EXAM: CT HEAD WITHOUT CONTRAST TECHNIQUE: Contiguous axial images were obtained from the base of the skull through the vertex without intravenous contrast. RADIATION DOSE REDUCTION: This exam was performed according to the departmental dose-optimization program which includes automated exposure control, adjustment of the mA and/or kV according to patient size and/or use of iterative reconstruction technique. COMPARISON:  None Available. FINDINGS:  Brain: No evidence of acute infarction, hemorrhage, hydrocephalus, extra-axial collection or mass lesion/mass effect. There is mild diffuse atrophy and mild periventricular white matter hypodensity. Vascular: Atherosclerotic calcifications are present within the cavernous internal carotid arteries. Skull: Normal. Negative for fracture or focal lesion. Sinuses/Orbits: No acute finding. Other: None. IMPRESSION: 1. No acute intracranial abnormality. 2. 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 Electronically Signed   By: Darliss Cheney M.D.   On: 07/04/2023 23:59   DG Chest 2 View  Result Date: 07/04/2023 CLINICAL DATA:  Cough and fall  EXAM: CHEST - 2 VIEW COMPARISON:  Radiographs 05/31/2023 FINDINGS: Stable cardiomegaly. Posterior lower lobe consolidation on lateral view compatible with pneumonia. No correlate is seen on AP view. No pleural effusion or pneumothorax. No displaced rib fractures. IMPRESSION: Lower lobe pneumonia seen on lateral view. Electronically Signed   By: Minerva Fester M.D.   On: 07/04/2023 23:51    Assessment: 82 y.o. male   Generalized weakness/severe deconditioning Stage IV prostate cancer Left knee pain after fall CKD stage IIIa Chronic diastolic CHF PAF Obesity Hypothyroidism    Plan:  -I have personally reviewed his CT abdomen pelvis with contrast from yesterday, which was compared to previous CT from September 30, 2021, and PET scan from every 29 2024.  The scan confirmed diffuse metastatic disease to bones and increased abdominal and pelvic adenopathy which is concerning for cancer progression.  However his recent PSA on June 29, 2023 was 4.1, it has dropped from 8.3 3 months ago, and 74 in 12/2022 when he had PET scan.  Ideally, I would like to have a repeated PSMA PET scan after discharge for further evaluation. -Replete aggressively weakness, I have held Zytiga and prednisone last week.  Will  continue ADT Eligard every 6 months, he is next due in Feb 2025.  -I encouraged him to continue physical therapy, he is probably going to a rehab, which helped him last time.  -Patient's disease is still castration sensitive, treatable, I do not recommend hospice at this point. -No additional oncology workup in the hospital. -He is scheduled to see me back next month in office. -I spoke with his daughter [name] today.  Patient does not want his daughter Misty Stanley who lives in [place] to be much involved in his healthcare.  Misty Stanley is currently reduced as his health power of attorney, I encourage patient to speak with our social worker if he wishes to change his power of attorney.  I spent a total of 50 minutes for his visit today, more than 50% time on face to face counseling.  Malachy Mood, MD 07/06/2023

## 2023-07-06 NOTE — Progress Notes (Signed)
Bilateral lower extremity venous study completed.   Preliminary results relayed to DO and RN.  Please see CV Procedures for preliminary results.  Christene Lye, RVT  9:41 AM 07/06/23

## 2023-07-06 NOTE — TOC Initial Note (Signed)
Transition of Care [facility]) - Initial/Assessment Note    Patient Details  Name: Terry [name] MRN: [id number] Date of Birth: 05/24/1941  Transition of Care [facility]) CM/SW Contact:    Nashiya Disbrow A [place], Theresia Majors Phone Number: 07/06/2023, 4:16 PM  Clinical Narrative:                  CSW contacted pt and pt's daughter, Misty Stanley by phone and discussed discharge plan.   Misty Stanley stated that she is the power of attorney for pt and that she had been caring for him while they lived in [place] but plans for pt to go to SNF here while she works to relocate back to [facility].   She stated that pt and family are in agreement for plan for short term rehab and said that pt had a pleasant experience at [facility] and that is their first choice.   She said that pt and pt's family have some complicated family issues and that going forward with family plans she is the main point of contact to facilitate easier communication. She said other family members could contact, but she was primary.    CSW to complete SNF workup.   TOC will continue to follow.  Expected Discharge Plan: Skilled Nursing Facility Barriers to Discharge: SNF Pending bed offer, Continued Medical Work up, Family Issues, English as a second language teacher   Patient Goals and CMS Choice            Expected Discharge Plan and Services       Living arrangements for the past 2 months: Single Family Home                                      Prior Living Arrangements/Services Living arrangements for the past 2 months: Single Family Home Lives with:: Adult Children                   Activities of Daily Living Home Assistive Devices/Equipment: Environmental consultant (specify type), Cane (specify quad or straight) ADL Screening (condition at time of admission) Patient's cognitive ability adequate to safely complete daily activities?: No Is the patient deaf or have difficulty hearing?: No Does the patient have difficulty seeing, even when wearing  glasses/contacts?: No Does the patient have difficulty concentrating, remembering, or making decisions?: No Patient able to express need for assistance with ADLs?: Yes Does the patient have difficulty dressing or bathing?: Yes Independently performs ADLs?: No Communication: Independent Dressing (OT): Needs assistance Is this a change from baseline?: Pre-admission baseline Grooming: Needs assistance Is this a change from baseline?: Pre-admission baseline Feeding: Independent Bathing: Needs assistance Is this a change from baseline?: Pre-admission baseline Toileting: Needs assistance Is this a change from baseline?: Pre-admission baseline In/Out Bed: Needs assistance Is this a change from baseline?: Pre-admission baseline Walks in Home: Independent with device (comment) Does the patient have difficulty walking or climbing stairs?: Yes Weakness of Legs: Both Weakness of Arms/Hands: Both  Permission Sought/Granted                  Emotional Assessment Appearance:: Appears older than stated age Attitude/Demeanor/Rapport: Lethargic Affect (typically observed): Appropriate Orientation: : Oriented to Self, Oriented to Place, Oriented to Situation, Oriented to  Time Alcohol / Substance Use: Not Applicable Psych Involvement: No (comment)  Admission diagnosis:  Weakness [R53.1] Left lower lobe pneumonia [J18.9] Acute cystitis without hematuria [N30.00] Pneumonia of lower lobe due to infectious organism,  unspecified laterality [J18.9] Patient Active Problem List   Diagnosis Date Noted   Left lower lobe pneumonia 07/05/2023   Acute cystitis 06/01/2023   Generalized weakness 05/31/2023   Urinary incontinence 05/09/2023   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) 02/24/2023   Asymptomatic microscopic hematuria 01/22/2022   Benign prostatic hyperplasia with lower urinary tract symptoms 01/22/2022   Nocturia 01/22/2022   Retained ureteral stent 01/22/2022    Goals of care, counseling/discussion 09/04/2021   Sepsis (HCC) 10/24/2020   Complicated UTI (urinary tract infection) 10/24/2020   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) 10/24/2020   CHF, chronic (HCC)    Prostate cancer (HCC)    Congestive heart failure (HCC) 07/02/2020   Macrocytic anemia 06/30/2020   Elevated PSA 06/30/2020   Cellulitis of lower extremity 06/30/2020   Prediabetes 01/04/2019   Paroxysmal atrial fibrillation (HCC) 01/01/2019   Risk for falls 12/27/2017   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HCC) 10/18/2017   Right ureteral stone 10/01/2017   Iron deficiency anemia due to chronic blood loss 06/22/2016   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   Depression with anxiety 06/19/2015   CKD (chronic kidney disease) stage 3, GFR 30-59 ml/min (HCC) 05/26/2015   Essential hypertension 11/01/2014   Gouty arthritis of toe 11/01/2014   Morbid obesity due to excess calories (HCC) 11/01/2014   Primary osteoarthritis of both knees 11/01/2014   PCP:  Donita Brooks, MD Pharmacy:   [facility] [address]), [place] - [address] [place] ([place]) [place] [address] Phone: [phone] Fax: [phone]  [place] [facility] [address] [place] [address] Phone: [phone] Fax: [phone]     Social Determinants of Health (SDOH) Social History: SDOH Screenings   Food Insecurity: Food Insecurity Present (07/05/2023)  Housing: High Risk (07/05/2023)  Transportation Needs: Unmet Transportation Needs (07/05/2023)  Utilities: Not At Risk (07/05/2023)  Alcohol Screen: Low Risk  (06/09/2022)  Depression (PHQ2-9): Low Risk  (01/14/2023)  Financial Resource Strain: Low Risk  (12/17/2022)  Physical Activity: Insufficiently Active (12/17/2022)  Social Connections: Socially Isolated (12/17/2022)  Stress: No Stress Concern Present (12/17/2022)  Tobacco Use:  Low Risk  (07/04/2023)   SDOH Interventions:     Readmission Risk Interventions    06/02/2023    3:52 PM  Readmission Risk Prevention Plan  Transportation Screening Complete  PCP or Specialist Appt within 5-7 Days Complete  Home Care Screening Complete  Medication Review (RN CM) Complete

## 2023-07-06 NOTE — Progress Notes (Signed)
Physical Therapy Treatment Patient Details Name: Terry [name] MRN: [id number] DOB: [birth year] Today's Date: 07/06/2023   History of Present Illness 82 y.o. male presented by EMS after having left leg pain. He reportedly slid out of his bed 24 hours prior.  He was unable to ambulate due to the pain. Discharge from skilled nursing facility about 5 days before. 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  PMH: prostate cancer, iron deficiency anemia, CKD 3, hypertension, PAF on Eliquis, diastolic dysfunction (HFpEF),  and hypothyroidism.    PT Comments  Pt received in supine, alert and agreeable to therapy session and with good participation and improved tolerance for bed mobility and able to initiate transfer training this session. Pt needing up to +2 modA for sit<>stand from elevated height bed to bari RW and EVA walker. Pt needed increased support of EVA walker under his forearms to initiate weight shifting and stepping for step pivot transfer, and was quick to fatigue, needing chair pulled closer to him prior to sitting down. Pt performed step pivot from bed to chair with up to +2 maxA. RN notified pt will need hoyer lift and bari hoyer pad ordered to room for safety with return transfer from chair>bed. Pt continues to benefit from PT services to progress toward functional mobility goals.    If plan is discharge home, recommend the following: Two people to help with walking and/or transfers;Assistance with cooking/housework;Direct supervision/assist for medications management;Direct supervision/assist for financial management;Assist for transportation;Help with stairs or ramp for entrance;A lot of help with bathing/dressing/bathroom   Can travel by private vehicle     No  Equipment Recommendations  None recommended by PT (anticipate need for mechanical lift and bariatric hospital bed if he goes home instead of SNF, pending progress)    Recommendations for Other  Services       Precautions / Restrictions Precautions Precautions: Fall Precaution Comments: slid out of bed at home per chart Restrictions Weight Bearing Restrictions: No     Mobility  Bed Mobility Overal bed mobility: Needs Assistance Bed Mobility: Supine to Sit     Supine to sit: HOB elevated, Used rails, Contact guard     General bed mobility comments: pt required incr time and incr effort  to scoot out towards EOB, dense cues to avoid sliding his shoulder over edge prior to raising his trunk    Transfers Overall transfer level: Needs assistance Equipment used: Rolling walker (2 wheels), Bilateral platform walker (EVA walker and bari RW) Transfers: Sit to/from Stand, Bed to chair/wheelchair/BSC Sit to Stand: Mod assist, +2 physical assistance, From elevated surface   Step pivot transfers: Max assist, +2 physical assistance, From elevated surface       General transfer comment: from elevated bed<>bari RW, pt unable to effectively/safely weight shift and was in very crouched/flexed posture at RW using forearms for better support, so pt returned to sitting EOB and PTA obtained EVA platform walker as a safer device. Pt then pivoted from bed>chair on his R side using EVA walker, but after ~5-6 lateral steps to his R, chair pulled closer as he fatigued.    Ambulation/Gait             Pre-gait activities: ~84ft pivotal steps from bed>chair with low foot clearance and needs manual assist to raise/abduct his RLE each step after the first due to fatigue. +2 maxA with Teacher, English as a foreign language  Mobility     Tilt Bed    Modified Rankin (Stroke Patients Only)       Balance Overall balance assessment: Needs assistance Sitting-balance support: No upper extremity supported, Feet unsupported Sitting balance-Leahy Scale: Fair     Standing balance support: Bilateral upper extremity supported, During functional activity, Reliant on assistive  device for balance Standing balance-Leahy Scale: Poor Standing balance comment: +2 min/modA for static standing at RW, +2 mod to maxA for dynamic standing tasks with BUE support                            Cognition Arousal: Alert Behavior During Therapy: Flat affect Overall Cognitive Status: Within Functional Limits for tasks assessed (daughter in room but she did not indicate if his cognition was different than baseline)                                 General Comments: Pt following simple commands, but slow to initiate at times and has difficulty sequencing as discussed/described.        Exercises Other Exercises Other Exercises: supine BLE AROM: ankle pumps x10 reps ea Other Exercises: seated BLE AROM: hip flexion x10 reps ea    General Comments General comments (skin integrity, edema, etc.): RN notified pt bed lower hydraulics appeared broken with end of bed and bed height dropping down into trendelenburg as pt sitting EOB, also pt will need hoyer lift pad and lift brought to room for return transfer, RN to order.      Pertinent Vitals/Pain Pain Assessment Pain Assessment: Faces Faces Pain Scale: Hurts little more Pain Location: BLE intermittently; hips/knees/ankles Pain Descriptors / Indicators: Grimacing, Discomfort ("not tender to the touch") Pain Intervention(s): Limited activity within patient's tolerance, Monitored during session, Repositioned, Other (comment) (pt had Gabapentin ~4 hours prior)    Home Living Family/patient expects to be discharged to:: Private residence Living Arrangements: Children (son and son-in-law) Available Help at Discharge: Family;Available PRN/intermittently   Home Access: Stairs to enter Entrance Stairs-Rails: None Entrance Stairs-Number of Steps: 1+1 Alternate Level Stairs-Number of Steps: full flight but reports he has and uses stair chair lift Home Layout: Two level;Bed/bath upstairs;1/2 bath on main  level Home Equipment: Cane - single point;Rollator (4 wheels);Hand held shower head;Tub bench Additional Comments: Pt reports "a lot" of falls within the last year, mainly slides out of the bed    Prior Function            PT Goals (current goals can now be found in the care plan section) Acute Rehab PT Goals Patient Stated Goal: To get out of bed and to chair and eventually return to the park to watch the pond. PT Goal Formulation: Patient unable to participate in goal setting Time For Goal Achievement: 07/19/23 Progress towards PT goals: Progressing toward goals    Frequency    Min 1X/week      PT Plan      Co-evaluation PT/OT/SLP Co-Evaluation/Treatment: Yes Reason for Co-Treatment: For patient/therapist safety;To address functional/ADL transfers PT goals addressed during session: Mobility/safety with mobility;Balance;Proper use of DME;Strengthening/ROM        AM-PAC PT "6 Clicks" Mobility   Outcome Measure  Help needed turning from your back to your side while in a flat bed without using bedrails?: A Little Help needed moving from lying on your back to sitting on the side of a flat bed without  using bedrails?: A Lot (if bed flat and no rail) Help needed moving to and from a bed to a chair (including a wheelchair)?: Total Help needed standing up from a chair using your arms (e.g., wheelchair or bedside chair)?: A Lot Help needed to walk in hospital room?: Total Help needed climbing 3-5 steps with a railing? : Total 6 Click Score: 10    End of Session Equipment Utilized During Treatment: Gait belt Activity Tolerance: Patient tolerated treatment well;Patient limited by fatigue Patient left: with call bell/phone within reach;in chair;with chair alarm set;with family/visitor present;Other (comment) (daughter in the room) Nurse Communication: Mobility status;Need for lift equipment PT Visit Diagnosis: Other abnormalities of gait and mobility (R26.89);Muscle weakness  (generalized) (M62.81)     Time: 4098-1191 PT Time Calculation (min) (ACUTE ONLY): 39 min  Charges:    $Therapeutic Activity: 8-22 mins PT General Charges $$ ACUTE PT VISIT: 1 Visit                     Trevon Strothers P., PTA Acute Rehabilitation Services Secure Chat Preferred 9a-5:30pm Office: [phone]    Dorathy Kinsman [facility] 07/06/2023, 2:51 PM

## 2023-07-06 NOTE — Evaluation (Signed)
Occupational Therapy Evaluation Patient Details Name: Terry [name] MRN: [id number] DOB: 1941-02-07 Today's Date: 07/06/2023   History of Present Illness 82 y.o. male presented by EMS after having left leg pain. He reportedly slid out of his bed 24 hours prior.  He was unable to ambulate due to the pain. Discharge from skilled nursing facility about 5 days before. 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  PMH: prostate cancer, iron deficiency anemia, CKD 3, hypertension, PAF on Eliquis, diastolic dysfunction (HFpEF),  and hypothyroidism.   Clinical Impression   Pt admitted for above, has an extensive hx of falls due to sliding out of bed per family report. Pt presenting with generalized weakness, needs assist with weight shifts and initiating steps with RLE. Pt needs Max A to Total A for LB ADLs, and Mod A +2 for STS from elevate bed with upright walker due to strong dependency on RW and forward flexed posture needing cueing to readjust pt. Pt needed Max A +2 for step pivot to recliner due to lack of RLE mobility. While sitting EOB, pt needing warmup mobility of BLEs in preparation for OOB mobility. Pt would benefit from continued acute skilled OT services to address deficits and help transition to next level of care. Patient would benefit from post acute skilled rehab facility with <3 hours of therapy and 24/7 support        If plan is discharge home, recommend the following: Two people to help with walking and/or transfers;A lot of help with bathing/dressing/bathroom;Assistance with cooking/housework;Help with stairs or ramp for entrance    Functional Status Assessment  Patient has had a recent decline in their functional status and demonstrates the ability to make significant improvements in function in a reasonable and predictable amount of time.  Equipment Recommendations  None recommended by OT (defer to next level of care)    Recommendations for Other  Services       Precautions / Restrictions Precautions Precautions: Fall Precaution Comments: slid out of bed at home per chart Restrictions Weight Bearing Restrictions: No      Mobility Bed Mobility Overal bed mobility: Needs Assistance Bed Mobility: Supine to Sit Rolling: Min assist   Supine to sit: HOB elevated, Used rails, Contact guard   Sit to sidelying: Min assist General bed mobility comments: pt required incr time and incr effort  to scoot out towards EOB, dense cues to avoid sliding his shoulder over edge prior to raising his trunk    Transfers Overall transfer level: Needs assistance Equipment used: Rolling walker (2 wheels), Bilateral platform walker (EVA walker and bari RW) Transfers: Sit to/from Stand, Bed to chair/wheelchair/BSC Sit to Stand: Mod assist, +2 physical assistance, From elevated surface     Step pivot transfers: Max assist, +2 physical assistance, From elevated surface     General transfer comment: from elevated bed<>bari RW, pt unable to effectively/safely weight shift and was in very crouched/flexed posture at RW using forearms for better support, so pt returned to sitting EOB and PTA obtained EVA platform walker as a safer device. Pt then pivoted from bed>chair on his R side using EVA walker, but after ~5-6 lateral steps to his R, chair pulled closer as he fatigued.      Balance Overall balance assessment: Needs assistance Sitting-balance support: No upper extremity supported, Feet unsupported Sitting balance-Leahy Scale: Fair     Standing balance support: Bilateral upper extremity supported, During functional activity, Reliant on assistive device for balance Standing balance-Leahy Scale: Poor  Standing balance comment: +2 min/modA for static standing at RW, +2 mod to maxA for dynamic standing tasks with BUE support                           ADL either performed or assessed with clinical judgement   ADL Overall ADL's : Needs  assistance/impaired Eating/Feeding: Independent;Sitting   Grooming: Set up;Sitting   Upper Body Bathing: Modified independent;Sitting   Lower Body Bathing: Maximal assistance;Sitting/lateral leans   Upper Body Dressing : Modified independent;Sitting   Lower Body Dressing: Total assistance;Bed level Lower Body Dressing Details (indicate cue type and reason): donning socks Toilet Transfer: Moderate assistance;+2 for physical assistance;+2 for safety/equipment;Stand-pivot;Rolling walker (2 wheels)   Toileting- Clothing Manipulation and Hygiene: +2 for physical assistance;Maximal assistance;Sit to/from stand       Functional mobility during ADLs: +2 for physical assistance;Rolling walker (2 wheels);Maximal assistance General ADL Comments: Pt needing manual assist with initiating steps using RLE     Vision         Perception         Praxis         Pertinent Vitals/Pain Pain Assessment Pain Assessment: Faces Faces Pain Scale: Hurts little more Pain Location: BLE intermittently; hips/knees/ankles Pain Descriptors / Indicators: Grimacing, Discomfort ("not tender to the touch") Pain Intervention(s): Limited activity within patient's tolerance, Monitored during session, Repositioned     Extremity/Trunk Assessment Upper Extremity Assessment Upper Extremity Assessment: Generalized weakness   Lower Extremity Assessment Lower Extremity Assessment: Generalized weakness (incr bil LE edema (?baseline); pt <3/5 SLR, but also very sleepy)   Cervical / Trunk Assessment Cervical / Trunk Assessment: Other exceptions Cervical / Trunk Exceptions: morbid obesity   Communication Communication Communication: No apparent difficulties Cueing Techniques: Gestural cues;Tactile cues;Verbal cues   Cognition Arousal: Alert Behavior During Therapy: Flat affect Overall Cognitive Status: Within Functional Limits for tasks assessed (daughter in room but she did not indicate if his cognition was  different than baseline)                                 General Comments: Pt following simple commands, but slow to initiate at times and has difficulty sequencing as discussed/described.     General Comments  RN notified pt bed lower hydraulics appeared broken with end of bed and bed height dropping down into trendelenburg as pt sitting EOB, also pt will need hoyer lift pad and lift brought to room for return transfer, RN to order.    Exercises     Shoulder Instructions      Home Living Family/patient expects to be discharged to:: Private residence Living Arrangements: Children (son and son-in-law) Available Help at Discharge: Family;Available PRN/intermittently   Home Access: Stairs to enter Entrance Stairs-Number of Steps: 1+1 Entrance Stairs-Rails: None Home Layout: Two level;Bed/bath upstairs;1/2 bath on main level Alternate Level Stairs-Number of Steps: full flight but reports he has and uses stair chair lift   Bathroom Shower/Tub: Chief Strategy Officer: Standard     Home Equipment: Cane - single point;Rollator (4 wheels);Hand held shower head;Tub bench   Additional Comments: Pt reports "a lot" of falls within the last year, mainly slides out of the bed      Prior Functioning/Environment Prior Level of Function : Needs assist;History of Falls (last six months)             Mobility Comments: per daughter,  when at SNF he was moving very well and using rollator; began going downhill after he went home ADLs Comments: Has assist with bathing/dressing/iADLs        OT Problem List: Decreased strength;Impaired balance (sitting and/or standing);Obesity      OT Treatment/Interventions: Self-care/ADL training;Balance training;Therapeutic exercise;Therapeutic activities;DME and/or AE instruction;Patient/family education    OT Goals(Current goals can be found in the care plan section) Acute Rehab OT Goals Patient Stated Goal: To get back to  walking OT Goal Formulation: With patient Time For Goal Achievement: 07/20/23 Potential to Achieve Goals: Good  OT Frequency: Min 1X/week    Co-evaluation PT/OT/SLP Co-Evaluation/Treatment: Yes Reason for Co-Treatment: For patient/therapist safety;To address functional/ADL transfers PT goals addressed during session: Mobility/safety with mobility;Balance;Proper use of DME;Strengthening/ROM OT goals addressed during session: ADL's and self-care;Proper use of Adaptive equipment and DME      AM-PAC OT "6 Clicks" Daily Activity     Outcome Measure Help from another person eating meals?: None Help from another person taking care of personal grooming?: A Little Help from another person toileting, which includes using toliet, bedpan, or urinal?: A Lot Help from another person bathing (including washing, rinsing, drying)?: A Lot Help from another person to put on and taking off regular upper body clothing?: None Help from another person to put on and taking off regular lower body clothing?: Total 6 Click Score: 16   End of Session Equipment Utilized During Treatment: Gait belt;Rolling walker (2 wheels) Nurse Communication: Need for lift equipment (hoyer lift- needs XL pad)  Activity Tolerance: Patient tolerated treatment well Patient left: in chair;with call bell/phone within reach;with chair alarm set  OT Visit Diagnosis: Unsteadiness on feet (R26.81);Other abnormalities of gait and mobility (R26.89);History of falling (Z91.81);Muscle weakness (generalized) (M62.81)                Time: 4098-1191 OT Time Calculation (min): 47 min Charges:  OT General Charges $OT Visit: 1 Visit OT Evaluation $OT Eval Moderate Complexity: 1 Mod OT Treatments $Therapeutic Activity: 8-22 mins  07/06/2023  AB, OTR/L  Acute Rehabilitation Services  Office: [phone]   Tristan Schroeder 07/06/2023, 3:40 PM

## 2023-07-06 NOTE — Plan of Care (Signed)
Patient alert/oriented X4. Patient up in chair for majority of shift. Patient compliant with medication administration and VSS. No complaints at this time, pending rehab placement.   Problem: Education: Goal: Knowledge of General Education information will improve Description: Including pain rating scale, medication(s)/side effects and non-pharmacologic comfort measures Outcome: Progressing   Problem: Health Behavior/Discharge Planning: Goal: Ability to manage health-related needs will improve Outcome: Progressing   Problem: Clinical Measurements: Goal: Ability to maintain clinical measurements within normal limits will improve Outcome: Progressing   Problem: Clinical Measurements: Goal: Will remain free from infection Outcome: Progressing   Problem: Clinical Measurements: Goal: Diagnostic test results will improve Outcome: Progressing   Problem: Clinical Measurements: Goal: Respiratory complications will improve Outcome: Progressing   Problem: Clinical Measurements: Goal: Cardiovascular complication will be avoided Outcome: Progressing   Problem: Activity: Goal: Risk for activity intolerance will decrease Outcome: Progressing   Problem: Nutrition: Goal: Adequate nutrition will be maintained Outcome: Progressing   Problem: Coping: Goal: Level of anxiety will decrease Outcome: Progressing   Problem: Elimination: Goal: Will not experience complications related to bowel motility Outcome: Progressing   Problem: Elimination: Goal: Will not experience complications related to urinary retention Outcome: Progressing   Problem: Pain Managment: Goal: General experience of comfort will improve Outcome: Progressing   Problem: Safety: Goal: Ability to remain free from injury will improve Outcome: Progressing   Problem: Skin Integrity: Goal: Risk for impaired skin integrity will decrease Outcome: Progressing

## 2023-07-06 NOTE — [place]2 (Signed)
[place] MEDICAID [place]2 LEVEL OF CARE FORM     IDENTIFICATION  Patient Name: Terry [name] Birthdate: 1941/10/31 Sex: male Admission Date (Current Location): 07/04/2023  [facility] and [place] Number:  Producer, television/film/video and Address:  [facility], 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) 02/24/2023   Asymptomatic microscopic hematuria 01/22/2022   Benign prostatic hyperplasia with lower urinary tract symptoms 01/22/2022   Nocturia 01/22/2022   Retained ureteral stent 01/22/2022   Goals of care, counseling/discussion 09/04/2021   Sepsis (HCC) 10/24/2020   Complicated UTI (urinary tract infection) 10/24/2020   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) 10/24/2020   CHF, chronic (HCC)    Prostate cancer (HCC)    Congestive heart failure (HCC) 07/02/2020   Macrocytic anemia 06/30/2020   Elevated PSA 06/30/2020   Cellulitis of lower extremity 06/30/2020   Prediabetes 01/04/2019   Paroxysmal atrial fibrillation (HCC) 01/01/2019   Risk for falls 12/27/2017   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HCC) 10/18/2017   Right ureteral stone 10/01/2017   Iron deficiency anemia due to chronic blood loss 06/22/2016   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   Depression with anxiety  06/19/2015   CKD (chronic kidney disease) stage 3, GFR 30-59 ml/min (HCC) 05/26/2015   Essential hypertension 11/01/2014   Gouty arthritis of toe 11/01/2014   Morbid obesity due to excess calories (HCC) 11/01/2014   Primary osteoarthritis of both knees 11/01/2014    Orientation RESPIRATION BLADDER Height & Weight     Self, Time, Situation, Place  Normal Incontinent, External catheter Weight: (!) 310 lb 6.5 oz (140.8 kg) Height:  5\' 7"  (170.2 cm)  BEHAVIORAL SYMPTOMS/MOOD NEUROLOGICAL BOWEL NUTRITION STATUS      Continent Diet (see discharege summary)  AMBULATORY STATUS COMMUNICATION OF NEEDS Skin   Extensive Assist Verbally Normal                       Personal Care Assistance Level of Assistance  Bathing, Feeding, Dressing Bathing Assistance: Maximum assistance Feeding assistance: Limited assistance Dressing Assistance: Maximum assistance     Functional Limitations Info  Sight, Hearing, Speech Sight Info: Adequate Hearing Info: Adequate Speech Info: Adequate    SPECIAL CARE FACTORS FREQUENCY  PT (By licensed PT), OT (By licensed OT)     PT Frequency: 5/xweek OT Frequency: 5x/week            Contractures Contractures Info: Not present    Additional Factors Info  Code Status, Allergies Code Status Info: FULL Allergies Info: Lisinopril           Current Medications (07/06/2023):  This is the current hospital active medication list Current Facility-Administered Medications  Medication Dose Route Frequency Provider Last Rate Last Admin   0.9 %  sodium chloride infusion   Intravenous Continuous McClung,  Elpidio Eric, MD       acetaminophen (TYLENOL) tablet 650 mg  650 mg Oral Q6H PRN Russella Dar, NP       allopurinol (ZYLOPRIM) tablet 50 mg  50 mg Oral Daily Russella Dar, NP   50 mg at 07/06/23 0933   apixaban (ELIQUIS) tablet 2.5 mg  2.5 mg Oral BID Russella Dar, NP   2.5 mg at 07/06/23 0934   carvedilol (COREG) tablet 25 mg  25 mg Oral BID  Russella Dar, NP   25 mg at 07/06/23 0933   gabapentin (NEURONTIN) capsule 300 mg  300 mg Oral TID Russella Dar, NP   300 mg at 07/06/23 1636   hydrALAZINE (APRESOLINE) tablet 50 mg  50 mg Oral Q8H Russella Dar, NP   50 mg at 07/06/23 1357   levothyroxine (SYNTHROID) tablet 50 mcg  50 mcg Oral Daily Russella Dar, NP   50 mcg at 07/06/23 0532   losartan (COZAAR) tablet 100 mg  100 mg Oral Daily Russella Dar, NP   100 mg at 07/06/23 0933   morphine (PF) 2 MG/ML injection 1 mg  1 mg Intravenous Q2H PRN Russella Dar, NP       ondansetron [facility]) tablet 4 mg  4 mg Oral Q6H PRN Russella Dar, NP       Or   ondansetron (ZOFRAN) injection 4 mg  4 mg Intravenous Q6H PRN Russella Dar, NP       oxyCODONE (Oxy IR/ROXICODONE) immediate release tablet 5-10 mg  5-10 mg Oral Q4H PRN Russella Dar, NP   10 mg at 07/05/23 1641   predniSONE (DELTASONE) tablet 5 mg  5 mg Oral Q breakfast Russella Dar, NP   5 mg at 07/06/23 0934   sodium chloride flush (NS) 0.9 % injection 3 mL  3 mL Intravenous Q12H Russella Dar, NP   3 mL at 07/06/23 0934   traMADol (ULTRAM) tablet 50 mg  50 mg Oral Q8H PRN Russella Dar, NP       Facility-Administered Medications Ordered in Other Encounters  Medication Dose Route Frequency Provider Last Rate Last Admin   leuprolide (6 Month) (ELIGARD) injection 45 mg  45 mg Subcutaneous Q6 months Creig Hines, MD   45 mg at 12/28/22 1549     Discharge Medications: Please see discharge summary for a list of discharge medications.  Relevant Imaging Results:  Relevant Lab Results:   Additional Information SSN: [id number]  Kuper Rennels A [place], [place]

## 2023-07-06 NOTE — Progress Notes (Addendum)
PROGRESS NOTE    Terry [name]  ZOX:[id number] DOB: 1941/08/11 DOA: 07/04/2023 PCP: Donita Brooks, MD    Brief Narrative:  Terry [name] is an 82 y.o. male with a history of metastatic prostate cancer, iron deficiency anemia, CKD 3, HTN, PAF on Eliquis, diastolic CHF, chronic R ureteral stone, and hypothyroidism who presented to the ER via EMS with left leg pain.  He reportedly slid out of his bed 24 hours prior.  He was unable to ambulate due to the pain, and generalized weakness.  Blood pressure was elevated at 190/100 at presentation.  He reported persistent severe deconditioning and an inability to care for himself since the time of his discharge from a skilled nursing facility about 5 days before.   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   Weight as of this encounter: 140.8 kg.    DVT prophylaxis: apixaban (ELIQUIS) tablet 2.5 mg Start: 07/05/23 1000 apixaban (ELIQUIS) tablet 2.5 mg    Code Status: Full Code Family Communication: called daughter- MPOA  Disposition Plan:  Level of care: Med-Surg Status is: Inpatient Remains inpatient appropriate     Consultants:  TOC  Subjective: No SOB, no CP  Objective: Vitals:   07/05/23 1600 07/05/23 1946 07/06/23 0500 07/06/23 0740  BP: (!) 176/73 (!) 157/57 (!) 148/78 (!) 146/60  Pulse: 67 77 67 81  Resp: 18 18  18   Temp: 97.7 F (36.5 C) 98.3 F (36.8 C) 97.7 F (36.5 C) 98.1 F (36.7 C)  TempSrc:   Oral   SpO2: 96% 96% 92% 96%  Weight:   (!) 140.8 kg   Height:        Intake/Output Summary (Last 24 hours) at 07/06/2023 1138 Last data filed at 07/06/2023 0703 Gross per 24 hour  Intake 100 ml  Output 400 ml  Net -300 ml   Ceasar Mons  Weights   07/04/23 1328 07/06/23 0500  Weight: 136.1 kg (!) 140.8 kg    Examination:   General: Appearance:    Severely obese male in no acute distress- overall weak appearing   Obese abd  Lungs:     respirations unlabored  Heart:    Normal heart rate.    MS:   All extremities are intact.    Neurologic:   Awake, alert       Data Reviewed: I have personally reviewed following labs and imaging studies  CBC: Recent Labs  Lab 06/29/23 1206 07/01/23 1242 07/05/23 0114 07/06/23 0239  WBC 5.9 5.8 6.0  6.2  NEUTROABS 4.5 4,159 4.2  --   HGB 10.5* 9.8* 11.2* 10.9*  HCT 33.5* 32.2* 37.1* 35.7*  MCV 78.8* 79.9* 80.0 78.8*  PLT 174 168 168 144*   Basic Metabolic Panel: Recent Labs  Lab 06/29/23 1206 07/01/23 1242 07/05/23 0114 07/06/23 0239  NA 145 143 142 139  K 3.1* 3.5 3.6 3.7  CL 112* 110 105 104  CO2 29 22 27 26   GLUCOSE 96 95 96 95  BUN 14 15 10 14   CREATININE 1.20 1.55* 1.35* 1.50*  CALCIUM 9.0 8.9 8.8* 7.8*  MG  --   --   --  2.1   GFR: Estimated Creatinine Clearance: 51.6 mL/min (A) (by C-G formula based on SCr of 1.5 mg/dL (H)). Liver Function Tests: Recent Labs  Lab 06/29/23 1206 07/06/23 0239  AST 13* 19  ALT 9 13  ALKPHOS 69 52  BILITOT 0.4 0.6  PROT 6.9 6.1*  ALBUMIN 3.5 2.6*   No results for input(s): "LIPASE", "AMYLASE" in the last 168 hours. No results for input(s): "AMMONIA" in the last 168 hours. Coagulation Profile: No results for input(s): "INR", "PROTIME" in the last 168 hours. Cardiac Enzymes: No results for input(s): "CKTOTAL", "CKMB", "CKMBINDEX", "TROPONINI" in the last 168 hours. BNP (last 3 results) No results for input(s): "PROBNP" in the last 8760 hours. HbA1C: No results for input(s): "HGBA1C" in the last 72 hours. CBG: No results for input(s): "GLUCAP" in the last 168 hours. Lipid Profile: No results for input(s): "CHOL", "HDL", "LDLCALC", "TRIG", "CHOLHDL", "LDLDIRECT" in the last 72 hours. Thyroid Function Tests: No results for input(s): "TSH", "T4TOTAL", "FREET4", "T3FREE", "THYROIDAB" in the last 72 hours. Anemia Panel: No results for input(s): "VITAMINB12", "FOLATE", "FERRITIN", "TIBC", "IRON", "RETICCTPCT" in the last 72 hours. Sepsis Labs: Recent Labs  Lab 07/05/23 0136 07/05/23 0358 07/06/23 0239  PROCALCITON  --   --  <0.10  LATICACIDVEN 1.5 1.0  --     Recent Results (from the past 240 hour(s))  Resp panel by RT-PCR (RSV, Flu A&B, Covid) Anterior Nasal Swab     Status: None   Collection Time: 07/05/23  1:20 AM    Specimen: Anterior Nasal Swab  Result Value Ref Range Status   SARS Coronavirus 2 by RT PCR NEGATIVE NEGATIVE Final   Influenza A by PCR NEGATIVE NEGATIVE Final   Influenza B by PCR NEGATIVE NEGATIVE Final    Comment: (NOTE) The Xpert Xpress SARS-CoV-2/FLU/RSV plus assay is intended as an aid in the diagnosis of influenza from Nasopharyngeal swab specimens and should not be used as a sole basis for treatment. Nasal washings and aspirates are unacceptable for Xpert Xpress SARS-CoV-2/FLU/RSV testing.  Fact Sheet for Patients: BloggerCourse.com  Fact Sheet for Healthcare Providers: SeriousBroker.it  This test is not yet approved or cleared by the [place] FDA and has been authorized for detection and/or diagnosis  of SARS-CoV-2 by FDA under an Emergency Use Authorization (EUA). This EUA will remain in effect (meaning this test can be used) for the duration of the COVID-19 declaration under Section 564(b)(1) of the Act, 21 U.S.C. section 360bbb-3(b)(1), unless the authorization is terminated or revoked.     Resp Syncytial Virus by PCR NEGATIVE NEGATIVE Final    Comment: (NOTE) Fact Sheet for Patients: BloggerCourse.com  Fact Sheet for Healthcare Providers: SeriousBroker.it  This test is not yet approved or cleared by the [place] FDA and has been authorized for detection and/or diagnosis of SARS-CoV-2 by FDA under an Emergency Use Authorization (EUA). This EUA will remain in effect (meaning this test can be used) for the duration of the COVID-19 declaration under Section 564(b)(1) of the Act, 21 U.S.C. section 360bbb-3(b)(1), unless the authorization is terminated or revoked.  Performed at [facility], [address] [place], [place] [address]   Blood culture (routine x 2)     Status: None (Preliminary result)   Collection Time: 07/05/23  1:36 AM    Specimen: BLOOD LEFT HAND  Result Value Ref Range Status   Specimen Description BLOOD LEFT HAND  Final   Special Requests   Final    BOTTLES DRAWN AEROBIC AND ANAEROBIC Blood Culture adequate volume   Culture   Final    NO GROWTH 1 DAY Performed at [facility], [address], [place], [place] [address]    Report Status PENDING  Incomplete  Blood culture (routine x 2)     Status: None (Preliminary result)   Collection Time: 07/05/23  1:44 AM   Specimen: BLOOD  Result Value Ref Range Status   Specimen Description BLOOD SITE NOT SPECIFIED  Final   Special Requests   Final    BOTTLES DRAWN AEROBIC AND ANAEROBIC Blood Culture results may not be optimal due to an excessive volume of blood received in culture bottles   Culture   Final    NO GROWTH 1 DAY Performed at [facility], [address], [place], [place] [address]    Report Status PENDING  Incomplete  MRSA Next Gen by PCR, Nasal     Status: None   Collection Time: 07/05/23 11:00 AM  Result Value Ref Range Status   MRSA by PCR Next Gen NOT DETECTED NOT DETECTED Final    Comment: (NOTE) The GeneXpert MRSA Assay (FDA approved for NASAL specimens only), is one component of a comprehensive MRSA colonization surveillance program. It is not intended to diagnose MRSA infection nor to guide or monitor treatment for MRSA infections. Test performance is not FDA approved in patients less than 7 years old. Performed at [facility], [address], [place], [place] [address]          Radiology Studies: VAS [place] LOWER EXTREMITY VENOUS (DVT)  Result Date: 07/06/2023  Lower Venous DVT Study Patient Name:  ASSAD MUHAMMED  Date of Exam:   07/06/2023 Medical Rec #: [id number]      Accession #:    [id number] Date of Birth: January 30, 1941      Patient Gender: M Patient Age:   5 years Exam Location:  [facility] Procedure:      VAS [place] LOWER EXTREMITY VENOUS (DVT) Referring Phys: JEFFREY MCCLUNG  --------------------------------------------------------------------------------  Indications: Swelling.  Risk Factors: Cancer -prostate. Anticoagulation: Eliquis. Limitations: Body habitus and poor ultrasound/tissue interface. Comparison Study: No previous study. Performing Technologist: McKayla Maag RVT, [place]  Examination Guidelines: A complete evaluation includes  B-mode imaging, spectral Doppler, color Doppler, and power Doppler as needed of all accessible portions of each vessel. Bilateral testing is considered an integral part of a complete examination. Limited examinations for reoccurring indications may be performed as noted. The reflux portion of the exam is performed with the patient in reverse Trendelenburg.  +--------+---------------+---------+-----------+----------+--------------------+ RIGHT   CompressibilityPhasicitySpontaneityPropertiesThrombus Aging       +--------+---------------+---------+-----------+----------+--------------------+ CFV     Full           Yes      Yes                                       +--------+---------------+---------+-----------+----------+--------------------+ SFJ     Full                                                              +--------+---------------+---------+-----------+----------+--------------------+ FV Prox Full                                                              +--------+---------------+---------+-----------+----------+--------------------+ FV Mid  Full                                                              +--------+---------------+---------+-----------+----------+--------------------+ FV                     Yes      Yes                  patent by color      Distal                                               doppler              +--------+---------------+---------+-----------+----------+--------------------+ PFV     Full                                                               +--------+---------------+---------+-----------+----------+--------------------+ POP     Full           Yes      Yes                                       +--------+---------------+---------+-----------+----------+--------------------+ PTV     Full  Limited              +--------+---------------+---------+-----------+----------+--------------------+ PERO                                                 Not visualized       +--------+---------------+---------+-----------+----------+--------------------+   +--------+---------------+---------+-----------+----------+--------------------+ LEFT    CompressibilityPhasicitySpontaneityPropertiesThrombus Aging       +--------+---------------+---------+-----------+----------+--------------------+ CFV     Full           Yes      Yes                                       +--------+---------------+---------+-----------+----------+--------------------+ SFJ     Full                                                              +--------+---------------+---------+-----------+----------+--------------------+ FV Prox Full                                                              +--------+---------------+---------+-----------+----------+--------------------+ FV Mid  Full                                                              +--------+---------------+---------+-----------+----------+--------------------+ FV                     Yes      Yes                  patent by color      Distal                                               doppler              +--------+---------------+---------+-----------+----------+--------------------+ PFV     Full                                                              +--------+---------------+---------+-----------+----------+--------------------+ POP     Full           Yes      Yes                                        +--------+---------------+---------+-----------+----------+--------------------+ PTV  Not visualized       +--------+---------------+---------+-----------+----------+--------------------+ PERO                                                 Not visualized       +--------+---------------+---------+-----------+----------+--------------------+     Summary: RIGHT: - There is no evidence of deep vein thrombosis in the lower extremity. However, portions of this examination were limited- see technologist comments above.  - No cystic structure found in the popliteal fossa.  LEFT: - There is no evidence of deep vein thrombosis in the lower extremity. However, portions of this examination were limited- see technologist comments above.  - No cystic structure found in the popliteal fossa.  *See table(s) above for measurements and observations.    Preliminary    CT ABDOMEN PELVIS W CONTRAST  Result Date: 07/05/2023 CLINICAL DATA:  82 year old male with history of acute onset of nonlocalized abdominal pain. History of metastatic prostate cancer. * Tracking Code: BO * EXAM: CT ABDOMEN AND PELVIS WITH CONTRAST TECHNIQUE: Multidetector CT imaging of the abdomen and pelvis was performed using the standard protocol following bolus administration of intravenous contrast. RADIATION DOSE REDUCTION: This exam was performed according to the departmental dose-optimization program which includes automated exposure control, adjustment of the mA and/or kV according to patient size and/or use of iterative reconstruction technique. CONTRAST:  75mL OMNIPAQUE IOHEXOL 350 MG/ML SOLN COMPARISON:  CT the abdomen and pelvis 09/30/2021. PET-CT 01/13/2023. FINDINGS: Lower chest: Unremarkable. Hepatobiliary: Subcentimeter low-attenuation lesion in segment 4B of the liver adjacent to the falciform ligament, too small to definitively characterize, but similar to prior studies and  statistically likely a cyst (no imaging follow-up recommended). No other suspicious appearing hepatic lesions are noted. No intra or extrahepatic biliary ductal dilatation. Faint densities lie dependently in the gallbladder, likely biliary sludge balls or noncalcified gallstones. Gallbladder is nondistended. No pericholecystic fluid or surrounding inflammatory changes. Pancreas: No pancreatic mass. No pancreatic ductal dilatation. No pancreatic or peripancreatic fluid collections or inflammatory changes. Spleen: Unremarkable. Adrenals/Urinary Tract: Severe atrophy of the right kidney again noted. Right-sided double-J ureteral stent in position with proximal loop reformed in the right renal pelvis and distal loop reformed in the urinary bladder. Severe chronic hydroureteronephrosis is minimally increased compared to prior study, terminating in the mid ureter where there is a 9 mm calculus adjacent to the stent (axial image 52 of series 3). Several curvilinear calcifications are also noted in the lower pole of the right kidney, some of which may represent calculi, while others may represent parenchymal calcifications. Left kidney and bilateral adrenal glands are normal in appearance. Urinary bladder is otherwise unremarkable in appearance. Stomach/Bowel: The appearance of the stomach is normal. There is no pathologic dilatation of small bowel or colon. Normal appendix. Vascular/Lymphatic: No significant atherosclerotic disease, aneurysm or dissection noted in the abdominal or pelvic vasculature. Multiple prominent borderline enlarged and mildly enlarged retroperitoneal and pelvic lymph nodes are noted. Clear enlargement of a left para-aortic lymph node adjacent to the left renal hilum (axial image 36 of series 3) which currently measures 1.7 cm in short axis (previously nonenlarged), concerning for metastatic disease. Other prominent retrocaval lymph node (axial image 33 of series 3) measuring 1.1 cm in short axis,  and left inguinal lymph node (axial image 78 of series 3) measuring 11 mm in short axis also noted. Reproductive: Prostate gland and  seminal vesicles are grossly unremarkable in appearance. Other: No significant volume of ascites.  No pneumoperitoneum. Musculoskeletal: Several sclerotic lesions are again noted in the visualized axial and appendicular skeleton, indicative of metastatic disease to the bones in this patient with history of prostate cancer. This appears progressive, with enlargement of several lesions (best example is in the right side of S1 where there is a 1.9 cm sclerotic lesion which previously measured only 1 cm). The largest lesion is in the left side of the L5 vertebral body (axial image 49 of series 3) currently measuring 4.2 x 3.5 cm. IMPRESSION: 1. 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 2. No other definite acute findings noted in the abdomen or pelvis to account for the patient's symptoms. 3. 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 Increasing lymphadenopathy, most evident in the retroperitoneum also suspicious for metastatic disease in this patient with documented history of prostate cancer. 4. Probable noncalcified gallstones or biliary sludge balls in the gallbladder. No findings to suggest an acute cholecystitis at this time. 5. Additional incidental findings, as above. Electronically Signed   By: Trudie Reed M.D.   On: 07/05/2023 05:24   DG Knee 2 Views Left  Result Date: 07/05/2023 CLINICAL DATA:  Fall, left knee pain EXAM: LEFT KNEE - 1-2 VIEW COMPARISON:  None Available. 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 Mild overall straightening. No acute fracture or dislocation. Moderate left knee effusion. Mild diffuse subcutaneous edema of the  visualized left lower extremity. IMPRESSION: 1. Severe tricompartmental degenerative arthritis. 2. Moderate left knee effusion. Electronically Signed   By: Helyn Numbers M.D.   On: 07/05/2023 03:42   CT Head Wo Contrast  Result Date: 07/04/2023 CLINICAL DATA:  Fall, trauma. EXAM: CT HEAD WITHOUT CONTRAST TECHNIQUE: Contiguous axial images were obtained from the base of the skull through the vertex without intravenous contrast. RADIATION DOSE REDUCTION: This exam was performed according to the departmental dose-optimization program which includes automated exposure control, adjustment of the mA and/or kV according to patient size and/or use of iterative reconstruction technique. COMPARISON:  None Available. FINDINGS: Brain: No evidence of acute infarction, hemorrhage, hydrocephalus, extra-axial collection or mass lesion/mass effect. There is mild diffuse atrophy and mild periventricular white matter hypodensity. Vascular: Atherosclerotic calcifications are present within the cavernous internal carotid arteries. Skull: Normal. Negative for fracture or focal lesion. Sinuses/Orbits: No acute finding. Other: None. IMPRESSION: 1. No acute intracranial abnormality. 2. 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 Electronically Signed   By: Darliss Cheney M.D.   On: 07/04/2023 23:59   DG Chest 2 View  Result Date: 07/04/2023 CLINICAL DATA:  Cough and fall EXAM: CHEST - 2 VIEW COMPARISON:  Radiographs 05/31/2023 FINDINGS: Stable cardiomegaly. Posterior lower lobe consolidation on lateral view compatible with pneumonia. No correlate is seen on AP view. No pleural effusion or pneumothorax. No displaced rib fractures. IMPRESSION: Lower lobe pneumonia seen on lateral view. Electronically Signed   By: Minerva Fester M.D.   On: 07/04/2023 23:51   DG Lumbar Spine Complete  Result Date: 07/04/2023 CLINICAL DATA:  Fall, back pain EXAM: LUMBAR SPINE -  COMPLETE 4+ VIEW COMPARISON:  PET-CT 01/13/2023 and lumbar radiographs 09/24/2011 FINDINGS: Right double-J ureteral stent noted. Possible stones along the stent at the level of the iliac crest. Sclerosis in the  lower lumbar spine compatible with metastatic lesions. Body habitus reduces diagnostic sensitivity and specificity. Poor cortical definition in some areas due to body habitus. Lower thoracic spondylosis. I do not see a definite fracture or acute subluxation. IMPRESSION: 1. No definite fracture or acute subluxation. Substantially reduced sensitivity due to body habitus, consider CT if there is a high clinical index of suspicion. 2. Sclerosis in the lower lumbar spine compatible with metastatic lesions. 3. Right double-J ureteral stent with possible stones along the stent at the level of the iliac crest. Electronically Signed   By: Gaylyn Rong M.D.   On: 07/04/2023 16:36   DG Hip Unilat With Pelvis 2-3 Views Left  Result Date: 07/04/2023 CLINICAL DATA:  Fall, injury EXAM: DG HIP (WITH OR WITHOUT PELVIS) 2-3V LEFT COMPARISON:  PET-CT 01/13/2023 FINDINGS: Distal portion of a right double-J ureteral stent is noted with loop formed in the urinary bladder. Cannot exclude stones or concretions adjacent to the tube at the level of the right iliac crest. Sclerotic lesions in the pelvis, most are plea notable in the right ischium, compatible with metastatic lesions. No fracture or acute bony findings identified. IMPRESSION: 1. No acute bony findings. 2. 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 3. Right double-J ureteral stent in place. Cannot exclude stones or concretions adjacent to the tube at the vertical level of the right iliac crest. Electronically Signed   By: Gaylyn Rong M.D.   On: 07/04/2023 16:33        Scheduled Meds:  allopurinol  50 mg Oral Daily   apixaban  2.5 mg Oral BID   carvedilol  25 mg Oral BID   gabapentin  300 mg Oral TID   hydrALAZINE  50 mg  Oral Q8H   levothyroxine  50 mcg Oral Daily   losartan  100 mg Oral Daily   predniSONE  5 mg Oral Q breakfast   sodium chloride flush  3 mL Intravenous Q12H   Continuous Infusions:  sodium chloride       LOS: 1 day    Time spent: 45 minutes spent on chart review, discussion with nursing staff, consultants, updating family and interview/physical exam; more than 50% of that time was spent in counseling and/or coordination of care.    Joseph Art, DO [facility] Available via Epic secure chat 7am-7pm After these hours, please refer to coverage provider listed on [email or web address] 07/06/2023, 11:38 AM

## 2023-07-07 DIAGNOSIS — N3000 Acute cystitis without hematuria: Secondary | ICD-10-CM | POA: Diagnosis not present

## 2023-07-07 DIAGNOSIS — R531 Weakness: Secondary | ICD-10-CM | POA: Diagnosis not present

## 2023-07-07 DIAGNOSIS — J189 Pneumonia, unspecified organism: Secondary | ICD-10-CM

## 2023-07-07 LAB — CBC
HCT: 33.2 % — ABNORMAL LOW (ref 39.0–52.0)
Hemoglobin: 10.1 g/dL — ABNORMAL LOW (ref 13.0–17.0)
MCH: 24.6 pg — ABNORMAL LOW (ref 26.0–34.0)
MCHC: 30.4 g/dL (ref 30.0–36.0)
MCV: 81 fL (ref 80.0–100.0)
Platelets: 179 10*3/uL (ref 150–400)
RBC: 4.1 MIL/uL — ABNORMAL LOW (ref 4.22–5.81)
RDW: 17.8 % — ABNORMAL HIGH (ref 11.5–15.5)
WBC: 5.5 10*3/uL (ref 4.0–10.5)
nRBC: 0 % (ref 0.0–0.2)

## 2023-07-07 LAB — BASIC METABOLIC PANEL
Anion gap: 9 (ref 5–15)
BUN: 14 mg/dL (ref 8–23)
CO2: 26 mmol/L (ref 22–32)
Calcium: 7.8 mg/dL — ABNORMAL LOW (ref 8.9–10.3)
Chloride: 105 mmol/L (ref 98–111)
Creatinine, Ser: 1.36 mg/dL — ABNORMAL HIGH (ref 0.61–1.24)
GFR, Estimated: 52 mL/min — ABNORMAL LOW (ref >60)
Glucose, Bld: 148 mg/dL — ABNORMAL HIGH (ref 70–99)
Potassium: 2.9 mmol/L — ABNORMAL LOW (ref 3.5–5.1)
Sodium: 140 mmol/L (ref 135–145)

## 2023-07-07 MED ORDER — POTASSIUM CHLORIDE CRYS ER 20 MEQ PO TBCR
40.0000 meq | EXTENDED_RELEASE_TABLET | Freq: Once | ORAL | Status: AC
  Administered 2023-07-07: 40 meq via ORAL
  Filled 2023-07-07: qty 2

## 2023-07-07 NOTE — Progress Notes (Signed)
PROGRESS NOTE    Terry [name]  ZOX:[id number] DOB: [birth year] DOA: 07/04/2023 PCP: Donita Brooks, MD    Brief Narrative:  Terry [name] is an 82 y.o. male with a history of metastatic prostate cancer, iron deficiency anemia, CKD 3, HTN, PAF on Eliquis, diastolic CHF, chronic R ureteral stone, and hypothyroidism who presented to the ER via EMS with left leg pain.  He reportedly slid out of his bed 24 hours prior.  He was unable to ambulate due to the pain, and generalized weakness.  Blood pressure was elevated at 190/100 at presentation.  He reported persistent severe deconditioning and an inability to care for himself since the time of his discharge from a skilled nursing facility about 5 days before.   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   Weight as of this encounter: 140.8 kg.    DVT prophylaxis: apixaban (ELIQUIS) tablet 2.5 mg Start: 07/05/23 1000 apixaban (ELIQUIS) tablet 2.5 mg    Code Status: Full Code Family Communication: patient wants only Cheryl updated  Disposition Plan:  Level of care: Med-Surg Status is: Inpatient Remains inpatient appropriate     Consultants:  TOC  Subjective: Wants to go to rehab  Objective: Vitals:   07/06/23 1636 07/06/23 1958 07/07/23 0518 07/07/23 0807  BP: (!) 142/85 (!) 164/82 138/67 (!) 160/80  Pulse: 76 81  78  Resp: 18 17  18   Temp: 98.3 F (36.8 C) 98.8 F (37.1 C)  97.9 F (36.6 C)  TempSrc: Oral     SpO2: 98% 98%  99%  Weight:      Height:        Intake/Output Summary (Last 24 hours) at 07/07/2023 1211 Last data filed at 07/07/2023 0500 Gross per 24 hour  Intake --  Output 300 ml  Net -300 ml   Filed Weights   07/04/23 1328 07/06/23 0500  Weight: 136.1 kg (!) 140.8 kg    Examination:   General: Appearance:    Severely obese male in no acute distress- overall weak appearing   Obese abd  Lungs:     respirations unlabored  Heart:    Normal heart rate.    MS:   All extremities are intact.    Neurologic:   Awake, alert       Data Reviewed: I have personally reviewed following labs and imaging studies  CBC: Recent Labs  Lab 07/01/23 1242  07/05/23 0114 07/06/23 0239 07/07/23 1051  WBC 5.8 6.0 6.2 5.5  NEUTROABS 4,159 4.2  --   --   HGB 9.8* 11.2* 10.9* 10.1*  HCT 32.2* 37.1* 35.7* 33.2*  MCV 79.9* 80.0 78.8* 81.0  PLT 168 168 144* 179   Basic Metabolic Panel: Recent Labs  Lab 07/01/23 1242 07/05/23 0114 07/06/23 0239 07/07/23 1051  NA 143 142 139 140  K 3.5 3.6 3.7 2.9*  CL 110 105 104 105  CO2 22 27 26 26   GLUCOSE 95 96 95 148*  BUN 15 10 14 14   CREATININE 1.55* 1.35* 1.50* 1.36*  CALCIUM 8.9 8.8* 7.8* 7.8*  MG  --   --  2.1  --    GFR: Estimated Creatinine Clearance: 56.9 mL/min (A) (by C-G formula based on SCr of 1.36 mg/dL (H)). Liver Function Tests: Recent Labs  Lab 07/06/23 0239  AST 19  ALT 13  ALKPHOS 52  BILITOT 0.6  PROT 6.1*  ALBUMIN 2.6*   No results for input(s): "LIPASE", "AMYLASE" in the last 168 hours. No results for input(s): "AMMONIA" in the last 168 hours. Coagulation Profile: No results for input(s): "INR", "PROTIME" in the last 168 hours. Cardiac Enzymes: No results for input(s): "CKTOTAL", "CKMB", "CKMBINDEX", "TROPONINI" in the last 168 hours. BNP (last 3 results) No results for input(s): "PROBNP" in the last 8760 hours. HbA1C: No results for input(s): "HGBA1C" in the last 72 hours. CBG: No results for input(s): "GLUCAP" in the last 168 hours. Lipid Profile: No results for input(s): "CHOL", "HDL", "LDLCALC", "TRIG", "CHOLHDL", "LDLDIRECT" in the last 72 hours. Thyroid Function Tests: No results for input(s): "TSH", "T4TOTAL", "FREET4", "T3FREE", "THYROIDAB" in the last 72 hours. Anemia Panel: No results for input(s): "VITAMINB12", "FOLATE", "FERRITIN", "TIBC", "IRON", "RETICCTPCT" in the last 72 hours. Sepsis Labs: Recent Labs  Lab 07/05/23 0136 07/05/23 0358 07/06/23 0239  PROCALCITON  --   --  <0.10  LATICACIDVEN 1.5 1.0  --     Recent Results (from the past 240 hour(s))  Resp panel by RT-PCR (RSV, Flu A&B, Covid) Anterior Nasal Swab     Status: None    Collection Time: 07/05/23  1:20 AM   Specimen: Anterior Nasal Swab  Result Value Ref Range Status   SARS Coronavirus 2 by RT PCR NEGATIVE NEGATIVE Final   Influenza A by PCR NEGATIVE NEGATIVE Final   Influenza B by PCR NEGATIVE NEGATIVE Final    Comment: (NOTE) The Xpert Xpress SARS-CoV-2/FLU/RSV plus assay is intended as an aid in the diagnosis of influenza from Nasopharyngeal swab specimens and should not be used as a sole basis for treatment. Nasal washings and aspirates are unacceptable for Xpert Xpress SARS-CoV-2/FLU/RSV testing.  Fact Sheet for Patients: BloggerCourse.com  Fact Sheet for Healthcare Providers: SeriousBroker.it  This test is not yet approved or cleared by the [place] FDA and has been authorized for detection and/or diagnosis  of SARS-CoV-2 by FDA under an Emergency Use Authorization (EUA). This EUA will remain in effect (meaning this test can be used) for the duration of the COVID-19 declaration under Section 564(b)(1) of the Act, 21 U.S.C. section 360bbb-3(b)(1), unless the authorization is terminated or revoked.     Resp Syncytial Virus by PCR NEGATIVE NEGATIVE Final    Comment: (NOTE) Fact Sheet for Patients: BloggerCourse.com  Fact Sheet for Healthcare Providers: SeriousBroker.it  This test is not yet approved or cleared by the [place] FDA and has been authorized for detection and/or diagnosis of SARS-CoV-2 by FDA under an Emergency Use Authorization (EUA). This EUA will remain in effect (meaning this test can be used) for the duration of the COVID-19 declaration under Section 564(b)(1) of the Act, 21 U.S.C. section 360bbb-3(b)(1), unless the authorization is terminated or revoked.  Performed at [facility], [address] [place], [place] [address]   Blood culture (routine x 2)     Status: None (Preliminary result)   Collection  Time: 07/05/23  1:36 AM   Specimen: BLOOD LEFT HAND  Result Value Ref Range Status   Specimen Description BLOOD LEFT HAND  Final   Special Requests   Final    BOTTLES DRAWN AEROBIC AND ANAEROBIC Blood Culture adequate volume   Culture   Final    NO GROWTH 2 DAYS Performed at [facility], [address] [place], [place] [address]    Report Status PENDING  Incomplete  Blood culture (routine x 2)     Status: None (Preliminary result)   Collection Time: 07/05/23  1:44 AM   Specimen: BLOOD  Result Value Ref Range Status   Specimen Description BLOOD SITE NOT SPECIFIED  Final   Special Requests   Final    BOTTLES DRAWN AEROBIC AND ANAEROBIC Blood Culture results may not be optimal due to an excessive volume of blood received in culture bottles   Culture   Final    NO GROWTH 2 DAYS Performed at [facility], [address], [place], [place] [address]    Report Status PENDING  Incomplete  MRSA Next Gen by PCR, Nasal     Status: None   Collection Time: 07/05/23 11:00 AM  Result Value Ref Range Status   MRSA by PCR Next Gen NOT DETECTED NOT DETECTED Final    Comment: (NOTE) The GeneXpert MRSA Assay (FDA approved for NASAL specimens only), is one component of a comprehensive MRSA colonization surveillance program. It is not intended to diagnose MRSA infection nor to guide or monitor treatment for MRSA infections. Test performance is not FDA approved in patients less than 75 years old. Performed at [facility], [address] [place], [place] [address]          Radiology Studies: VAS [place] LOWER EXTREMITY VENOUS (DVT)  Result Date: 07/06/2023  Lower Venous DVT Study Patient Name:  BAYLAN RETTERER  Date of Exam:   07/06/2023 Medical Rec #: [id number]      Accession #:    [id number] Date of Birth: 1941-10-19      Patient Gender: M Patient Age:   13 years Exam Location:  [facility] Procedure:      VAS [place] LOWER EXTREMITY VENOUS (DVT) Referring Phys: JEFFREY MCCLUNG  --------------------------------------------------------------------------------  Indications: Swelling.  Risk Factors: Cancer -prostate. Anticoagulation: Eliquis. Limitations: Body habitus and poor ultrasound/tissue interface. Comparison Study: No previous study. Performing Technologist: McKayla Maag RVT, [place]  Examination Guidelines: A complete evaluation includes  B-mode imaging, spectral Doppler, color Doppler, and power Doppler as needed of all accessible portions of each vessel. Bilateral testing is considered an integral part of a complete examination. Limited examinations for reoccurring indications may be performed as noted. The reflux portion of the exam is performed with the patient in reverse Trendelenburg.  +--------+---------------+---------+-----------+----------+--------------------+ RIGHT   CompressibilityPhasicitySpontaneityPropertiesThrombus Aging       +--------+---------------+---------+-----------+----------+--------------------+ CFV     Full           Yes      Yes                                       +--------+---------------+---------+-----------+----------+--------------------+ SFJ     Full                                                              +--------+---------------+---------+-----------+----------+--------------------+ FV Prox Full                                                              +--------+---------------+---------+-----------+----------+--------------------+ FV Mid  Full                                                              +--------+---------------+---------+-----------+----------+--------------------+ FV                     Yes      Yes                  patent by color      Distal                                               doppler              +--------+---------------+---------+-----------+----------+--------------------+ PFV     Full                                                               +--------+---------------+---------+-----------+----------+--------------------+ POP     Full           Yes      Yes                                       +--------+---------------+---------+-----------+----------+--------------------+ PTV     Full  Limited              +--------+---------------+---------+-----------+----------+--------------------+ PERO                                                 Not visualized       +--------+---------------+---------+-----------+----------+--------------------+   +--------+---------------+---------+-----------+----------+--------------------+ LEFT    CompressibilityPhasicitySpontaneityPropertiesThrombus Aging       +--------+---------------+---------+-----------+----------+--------------------+ CFV     Full           Yes      Yes                                       +--------+---------------+---------+-----------+----------+--------------------+ SFJ     Full                                                              +--------+---------------+---------+-----------+----------+--------------------+ FV Prox Full                                                              +--------+---------------+---------+-----------+----------+--------------------+ FV Mid  Full                                                              +--------+---------------+---------+-----------+----------+--------------------+ FV                     Yes      Yes                  patent by color      Distal                                               doppler              +--------+---------------+---------+-----------+----------+--------------------+ PFV     Full                                                              +--------+---------------+---------+-----------+----------+--------------------+ POP     Full           Yes      Yes                                        +--------+---------------+---------+-----------+----------+--------------------+ PTV  Not visualized       +--------+---------------+---------+-----------+----------+--------------------+ PERO                                                 Not visualized       +--------+---------------+---------+-----------+----------+--------------------+     Summary: RIGHT: - There is no evidence of deep vein thrombosis in the lower extremity. However, portions of this examination were limited- see technologist comments above.  - No cystic structure found in the popliteal fossa.  LEFT: - There is no evidence of deep vein thrombosis in the lower extremity. However, portions of this examination were limited- see technologist comments above.  - No cystic structure found in the popliteal fossa.  *See table(s) above for measurements and observations. Electronically signed by Heath Lark on 07/06/2023 at 8:52:53 PM.    Final         Scheduled Meds:  allopurinol  50 mg Oral Daily   apixaban  2.5 mg Oral BID   carvedilol  25 mg Oral BID   gabapentin  300 mg Oral TID   hydrALAZINE  50 mg Oral Q8H   levothyroxine  50 mcg Oral Daily   losartan  100 mg Oral Daily   potassium chloride  40 mEq Oral Once   predniSONE  5 mg Oral Q breakfast   sodium chloride flush  3 mL Intravenous Q12H   Continuous Infusions:  sodium chloride       LOS: 2 days    Time spent: 45 minutes spent on chart review, discussion with nursing staff, consultants, updating family and interview/physical exam; more than 50% of that time was spent in counseling and/or coordination of care.    Joseph Art, DO [facility] Available via Epic secure chat 7am-7pm After these hours, please refer to coverage provider listed on [email or web address] 07/07/2023, 12:11 PM

## 2023-07-07 NOTE — TOC Progression Note (Signed)
Transition of Care [facility]) - Progression Note    Patient Details  Name: Terry [name] MRN: [id number] Date of Birth: 14-Aug-1941  Transition of Care [facility]) CM/SW Contact  Jaianna Nicoll A [place], [place] Phone Number: 07/07/2023, 2:37 PM  Clinical Narrative:     CSW was contacted by pt's daughter, [name] [phone], with questions about pt's discharge plan. She conferenced in [place]a, pt's other daughter, who is familiar with CSW.   CSW presented bed offers. They said that pt and pt's family still wanted [facility].   CSW to reach out to liaison to determine bed availability. CSW to inquire about pt's co-pay days as well, to be determined.   Plan for discharge to SNF. If pt is in co-pay days and does not want to pay,Cheryl and Misty Stanley said that home health with care giver, who they have lined up if needed, would be possible.   TOC will continue to follow.   Expected Discharge Plan: Skilled Nursing Facility Barriers to Discharge: SNF Pending bed offer, Continued Medical Work up, Family Issues, English as a second language teacher  Expected Discharge Plan and Services       Living arrangements for the past 2 months: Single Family Home                                       Social Determinants of Health (SDOH) Interventions SDOH Screenings   Food Insecurity: Food Insecurity Present (07/05/2023)  Housing: High Risk (07/05/2023)  Transportation Needs: Unmet Transportation Needs (07/05/2023)  Utilities: Not At Risk (07/05/2023)  Alcohol Screen: Low Risk  (06/09/2022)  Depression (PHQ2-9): Low Risk  (01/14/2023)  Financial Resource Strain: Low Risk  (12/17/2022)  Physical Activity: Insufficiently Active (12/17/2022)  Social Connections: Socially Isolated (12/17/2022)  Stress: No Stress Concern Present (12/17/2022)  Tobacco Use: Low Risk  (07/04/2023)    Readmission Risk Interventions    06/02/2023    3:52 PM  Readmission Risk Prevention Plan  Transportation Screening Complete  PCP or Specialist Appt within  5-7 Days Complete  Home Care Screening Complete  Medication Review (RN CM) Complete

## 2023-07-07 NOTE — Plan of Care (Signed)
Problem: Education: Goal: Knowledge of General Education information will improve Description: Including pain rating scale, medication(s)/side effects and non-pharmacologic comfort measures Outcome: Progressing Pt understands he was admitted into the hospital for LLE pain s/p fall where he was unable to ambulate d/t pain and generalized weakness.  Upon being admitted pt was found to have LLL PNA and diagnosed with FTT.  He has a past medical history of prostate cancer with bone mets.    Problem: Clinical Measurements: Goal: Ability to maintain clinical measurements within normal limits will improve Outcome: Progressing VS WNL thus far except for his BP.  Pt is on antihypertensives per MD's orders.  He was found to be hypokalemic per his AM labs with a K of 2.9.  Benjamine Mola, MD supplemented K with of PO K x1.      Problem: Clinical Measurements: Goal: Will remain free from infection Outcome: Progressing S/Sx of infection monitored and assessed q-shift.  Pt has remained afebrile thus far.     Problem: Clinical Measurements: Goal: Respiratory complications will improve Outcome: Progressing Respiratory status monitored and assessed q-shift.  Pt is on room air with PO2 saturations at 97-99% and respiration rate of 18 breaths per minute.  She has not endorsed c/o SOE or DOE.     Problem: Clinical Measurements: Goal: Cardiovascular complication will be avoided Outcome: Progressing Pt's HR have been WNL thus far except for his BP.  He is on antihypertensives per MD's orders.  Pt has not endorsed c/o SOE/ DOE or CP/ palpation.     Problem: Activity: Goal: Risk for activity intolerance will decrease Outcome: Progressing Pt is dependent on RN staff for all his ADLs.  He is unable to move in the bed independently and needs to the assistance of RN staff to turn himself in the bed.  PT/ OT have been consulted to assess pt's needs.           Problem: Nutrition: Goal: Adequate nutrition will be  maintained Outcome: Progressing Pt is on regular diet per MD's orders.  He has been able to eat 75-100% of his meals thus far.  Pt has not endorsed c/o n/v or abdominal pain/ distention thus far.    Problem: Elimination: Goal: Will not experience complications related to bowel motility Outcome: Progressing Pt has endorsed c/o constipation.  He attempted to have a BM earlier this shift and was successful.    Problem: Elimination: Goal: Will not experience complications related to urinary retention Outcome: Progressing Pt has not endorsed dysuria, abdominal pain/ distention thus far.  He has an external male catheter (purewick) in place to capture her urine.  Management of external male catheter (purewick) performed per [facility] policy, procedure and guidelines.    Problem: Pain Managment: Goal: General experience of comfort will improve Outcome: Progressing Pt denied c/o pain thus far.   Problem: Safety: Goal: Ability to remain free from injury will improve Outcome: Progressing Pt has remained free from falls thus far. Instructed pt to utilize RN call light for assistance. Hourly rounds performed. Bed alarm implemented to keep pt safe from falls. Settings activated to third most sensitive mode. Bed in lowest position, locked with two upper side rails engaged. Belongings and call light within reach.    Problem: Skin Integrity: Goal: Risk for impaired skin integrity will decrease Outcome: Progressing Skin integrity monitored and assessed q-shift. Pt is on q2 hourly turns to prevent further skin impairment.  Tubes and drains assessed for device related pressure sores.  Pt is continent of both  bowel and bladder.  He was admitted into the hospital for LLE pain s/p fall where he was unable to ambulate d/t pain and generalized weakness.  For this reason he has an external male catheter (purewick) in place to capture her urine.  Management of external male catheter (purewick) performed per [facility] policy,  procedure and guidelines.

## 2023-07-08 DIAGNOSIS — R531 Weakness: Secondary | ICD-10-CM | POA: Diagnosis not present

## 2023-07-08 LAB — CBC
HCT: 31.1 % — ABNORMAL LOW (ref 39.0–52.0)
Hemoglobin: 9.5 g/dL — ABNORMAL LOW (ref 13.0–17.0)
MCH: 24.7 pg — ABNORMAL LOW (ref 26.0–34.0)
MCHC: 30.5 g/dL (ref 30.0–36.0)
MCV: 81 fL (ref 80.0–100.0)
Platelets: 177 10*3/uL (ref 150–400)
RBC: 3.84 MIL/uL — ABNORMAL LOW (ref 4.22–5.81)
RDW: 17.9 % — ABNORMAL HIGH (ref 11.5–15.5)
WBC: 6 10*3/uL (ref 4.0–10.5)
nRBC: 0 % (ref 0.0–0.2)

## 2023-07-08 LAB — BASIC METABOLIC PANEL
Anion gap: 5 (ref 5–15)
BUN: 15 mg/dL (ref 8–23)
CO2: 28 mmol/L (ref 22–32)
Calcium: 7.5 mg/dL — ABNORMAL LOW (ref 8.9–10.3)
Chloride: 106 mmol/L (ref 98–111)
Creatinine, Ser: 1.5 mg/dL — ABNORMAL HIGH (ref 0.61–1.24)
GFR, Estimated: 46 mL/min — ABNORMAL LOW (ref >60)
Glucose, Bld: 99 mg/dL (ref 70–99)
Potassium: 3.3 mmol/L — ABNORMAL LOW (ref 3.5–5.1)
Sodium: 139 mmol/L (ref 135–145)

## 2023-07-08 MED ORDER — POTASSIUM CHLORIDE CRYS ER 20 MEQ PO TBCR
40.0000 meq | EXTENDED_RELEASE_TABLET | Freq: Once | ORAL | Status: AC
  Administered 2023-07-08: 40 meq via ORAL
  Filled 2023-07-08: qty 2

## 2023-07-08 MED ORDER — FUROSEMIDE 10 MG/ML IJ SOLN
40.0000 mg | Freq: Once | INTRAMUSCULAR | Status: AC
  Administered 2023-07-08: 40 mg via INTRAVENOUS
  Filled 2023-07-08: qty 4

## 2023-07-08 NOTE — Progress Notes (Signed)
Palliative Medicine Team consult was received. Our current time to consult is >48 hours for non-urgent needs. Recommend referral for SNF based palliative care services at the time of discharge. If patient remains inpatient we will schedule a meeting with patient and caregivers/family at the earliest possible time we have a provider available. If there are urgent needs or questions please call [phone]. Thank you for consulting out team to assist with this patients care.   Anderson Malta, DO Palliative Medicine

## 2023-07-08 NOTE — TOC Progression Note (Signed)
Transition of Care [facility]) - Progression Note    Patient Details  Name: Terry [name] MRN: [id number] Date of Birth: July 16, 1941  [facility]) CM/SW Contact  Davyon Fisch A [place], [place] Phone Number: 07/08/2023, 5:12 PM  Clinical Narrative:     CSW contacted pt's daughter, Misty Stanley regarding bed offer to [facility] and informed her that pt was not in his co-pay days according to facility.   CSW to start auth as pt is stable and possible DC over the weekend.   TOC will continue to follow.   Expected Discharge Plan: Skilled Nursing Facility Barriers to Discharge: SNF Pending bed offer, Continued Medical Work up, Family Issues, English as a second language teacher  Expected Discharge Plan and Services       Living arrangements for the past 2 months: Single Family Home                                       Social Determinants of Health (SDOH) Interventions SDOH Screenings   Food Insecurity: Food Insecurity Present (07/05/2023)  Housing: High Risk (07/05/2023)  Transportation Needs: Unmet Transportation Needs (07/05/2023)  Utilities: Not At Risk (07/05/2023)  Alcohol Screen: Low Risk  (06/09/2022)  Depression (PHQ2-9): Low Risk  (01/14/2023)  Financial Resource Strain: Low Risk  (12/17/2022)  Physical Activity: Insufficiently Active (12/17/2022)  Social Connections: Socially Isolated (12/17/2022)  Stress: No Stress Concern Present (12/17/2022)  Tobacco Use: Low Risk  (07/04/2023)    Readmission Risk Interventions    06/02/2023    3:52 PM  Readmission Risk Prevention Plan  Transportation Screening Complete  PCP or Specialist Appt within 5-7 Days Complete  Home Care Screening Complete  Medication Review (RN CM) Complete

## 2023-07-08 NOTE — Progress Notes (Signed)
Physical Therapy Treatment Patient Details Name: Terry [name] MRN: [id number] DOB: 04-03-1941 Today's Date: 07/08/2023   History of Present Illness 82 y.o. male presented by EMS after having left leg pain. He reportedly slid out of his bed 24 hours prior.  He was unable to ambulate due to the pain. Discharge from skilled nursing facility about 5 days before. 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  PMH: prostate cancer, iron deficiency anemia, CKD 3, hypertension, PAF on Eliquis, diastolic dysfunction (HFpEF),  and hypothyroidism.    PT Comments  Pt received in supine, agreeable to therapy session, pt bed noted to be damp from purewick leak but pt appeared unaware/had not notified staff. Pt assisted to roll for bed linen change with assist from nursing staff and needing up to +2 modA for sit<>stand transfers and pivotal transfer from elevated bed>chair with bari RW. Pt with improved standing tolerance and better posture at RW this date with slightly improved bil foot clearance. Pt quick to fatigue and not yet able to progress gait after step pivot transfer, but performed multiple seated and supine BLE exercises. Pt continues to benefit from PT services to progress toward functional mobility goals.     If plan is discharge home, recommend the following: Two people to help with walking and/or transfers;Assistance with cooking/housework;Direct supervision/assist for medications management;Direct supervision/assist for financial management;Assist for transportation;Help with stairs or ramp for entrance;A lot of help with bathing/dressing/bathroom   Can travel by private vehicle     No  Equipment Recommendations  None recommended by PT (anticipate need for mechanical lift and bariatric hospital bed if he goes home instead of SNF, pending progress)    Recommendations for Other Services       Precautions / Restrictions Precautions Precautions: Fall Precaution  Comments: slid out of bed at home per chart Restrictions Weight Bearing Restrictions: No     Mobility  Bed Mobility Overal bed mobility: Needs Assistance Bed Mobility: Rolling, Sidelying to Sit Rolling: Min assist, Used rails Sidelying to sit: Min assist, HOB elevated, Used rails       General bed mobility comments: pt required incr time and incr effort  to scoot out towards EOB, minA trunk assist to push away from elevated HOB to sit upright    Transfers Overall transfer level: Needs assistance Equipment used: Rolling walker (2 wheels) (bari RW) Transfers: Sit to/from Stand, Bed to chair/wheelchair/BSC Sit to Stand: Mod assist, +2 physical assistance, From elevated surface   Step pivot transfers: +2 physical assistance, From elevated surface, Mod assist       General transfer comment: from elevated bed<>bari RW x2 reps and RW>recliner chair    Ambulation/Gait                   Stairs             Wheelchair Mobility     Tilt Bed    Modified Rankin (Stroke Patients Only)       Balance Overall balance assessment: Needs assistance Sitting-balance support: No upper extremity supported, Feet unsupported Sitting balance-Leahy Scale: Fair     Standing balance support: Bilateral upper extremity supported, During functional activity, Reliant on assistive device for balance Standing balance-Leahy Scale: Poor Standing balance comment: +2 min/modA for static standing at RW and dynamic standing tasks                            Cognition Arousal: Alert Behavior  During Therapy: WFL for tasks assessed/performed Overall Cognitive Status: No family/caregiver present to determine baseline cognitive functioning                                 General Comments: Pt following simple commands well.        Exercises Other Exercises Other Exercises: supine BLE AROM: ankle pumps, hip ADduction x10 reps ea Other Exercises: seated BLE  AROM: hip flexion, LAQ x30 reps ea    General Comments General comments (skin integrity, edema, etc.): pt c/o bil feet discomfort, tingling in toes, socks appeared too tight around ankles so socks removed at end of session and BLE elevated for edema relief      Pertinent Vitals/Pain Pain Assessment Pain Assessment: Faces Faces Pain Scale: Hurts a little bit Pain Location: BLE (toes) Pain Descriptors / Indicators: Discomfort, Tingling Pain Intervention(s): Monitored during session, Repositioned, Other (comment) (reviewed BLE elevation and ROM)    Home Living                          Prior Function            PT Goals (current goals can now be found in the care plan section) Acute Rehab PT Goals Patient Stated Goal: To eventually return to the park to watch the pond. PT Goal Formulation: Patient unable to participate in goal setting Time For Goal Achievement: 07/19/23 Progress towards PT goals: Progressing toward goals    Frequency    Min 1X/week      PT Plan      Co-evaluation              AM-PAC PT "6 Clicks" Mobility   Outcome Measure  Help needed turning from your back to your side while in a flat bed without using bedrails?: A Little Help needed moving from lying on your back to sitting on the side of a flat bed without using bedrails?: A Lot (if bed flat and no rail) Help needed moving to and from a bed to a chair (including a wheelchair)?: A Lot Help needed standing up from a chair using your arms (e.g., wheelchair or bedside chair)?: A Lot Help needed to walk in hospital room?: Total Help needed climbing 3-5 steps with a railing? : Total 6 Click Score: 11    End of Session Equipment Utilized During Treatment: Gait belt Activity Tolerance: Patient tolerated treatment well;Patient limited by fatigue Patient left: with call bell/phone within reach;in chair;with chair alarm set;Other (comment) (heels floated/LE elevated, food tray set in front  of him) Nurse Communication: Mobility status;Other (comment) (+2 safety) PT Visit Diagnosis: Other abnormalities of gait and mobility (R26.89);Muscle weakness (generalized) (M62.81)     Time: 4696-2952 PT Time Calculation (min) (ACUTE ONLY): 40 min  Charges:    $Therapeutic Exercise: 8-22 mins $Therapeutic Activity: 8-22 mins PT General Charges $$ ACUTE PT VISIT: 1 Visit                     Terry [name] P., PTA Acute Rehabilitation Services Secure Chat Preferred 9a-5:30pm Office: [phone]    Dorathy Kinsman [facility] 07/08/2023, 6:23 PM

## 2023-07-08 NOTE — Plan of Care (Signed)

## 2023-07-08 NOTE — Progress Notes (Signed)
PROGRESS NOTE    Terry [name]  XLK:[id number] DOB: January 22, 1941 DOA: 07/04/2023 PCP: Donita Brooks, MD    Brief Narrative:  Terry [name] is an 82 y.o. male with a history of metastatic prostate cancer, iron deficiency anemia, CKD 3, HTN, PAF on Eliquis, diastolic CHF, chronic R ureteral stone, and hypothyroidism who presented to the ER via EMS with left leg pain.  He reportedly slid out of his bed 24 hours prior.  He was unable to ambulate due to the pain, and generalized weakness.  Blood pressure was elevated at 190/100 at presentation.  He reported persistent severe deconditioning and an inability to care for himself since the time of his discharge from a skilled nursing facility about 5 days before.   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   Weight as of this encounter: 140.8 kg.    DVT prophylaxis: Place and maintain sequential compression device Start: 07/08/23 0920 apixaban (ELIQUIS) tablet 2.5 mg Start: 07/05/23 1000 apixaban (ELIQUIS) tablet 2.5 mg    Code Status: Full Code Family Communication: patient wants only [name] updated  Disposition Plan:  Level of care: Med-Surg Status is: Inpatient Remains inpatient appropriate     Consultants:  TOC  Subjective: C/o soreness in toes- would like a massage  Objective: Vitals:   07/07/23 1626 07/07/23 2051 07/08/23 0402 07/08/23 0731  BP: (!) 151/69 (!) 148/64 (!) 161/71 (!) 145/70  Pulse: 61 70 72 73  Resp: 18 18  16   Temp: 98 F (36.7 C) 98.9 F (37.2 C) 98.1 F (36.7 C) 98.9 F (37.2 C)  TempSrc: Oral Oral Oral Oral  SpO2: 97% 97% 100% 94%  Weight:      Height:  Intake/Output Summary (Last 24 hours) at 07/08/2023 1101 Last data filed at 07/08/2023 0523 Gross per 24 hour  Intake --  Output 1000 ml  Net -1000 ml   Filed Weights   07/04/23 1328 07/06/23 0500  Weight: 136.1 kg (!) 140.8 kg    Examination:   General: Appearance:    Severely obese male in no acute distress     Lungs:     respirations unlabored  Heart:    Normal heart rate.    MS:   All extremities are intact.    Neurologic:   Awake, alert, +  edema (lymph appearing)       Data Reviewed: I have personally reviewed following labs and imaging studies  CBC: Recent Labs  Lab 07/01/23 1242 07/05/23 0114 07/06/23 0239 07/07/23 1051 07/08/23 0427  WBC 5.8 6.0 6.2 5.5 6.0  NEUTROABS 4,159 4.2  --   --   --   HGB 9.8* 11.2* 10.9* 10.1* 9.5*  HCT 32.2* 37.1* 35.7* 33.2* 31.1*  MCV 79.9* 80.0 78.8* 81.0 81.0  PLT 168 168 144* 179 177   Basic Metabolic Panel: Recent Labs  Lab 07/01/23 1242 07/05/23 0114 07/06/23 0239 07/07/23 1051 07/08/23 0427  NA 143 142 139 140 139  K 3.5 3.6 3.7 2.9* 3.3*  CL 110 105 104 105 106  CO2 22 27 26 26 28   GLUCOSE 95 96 95 148* 99  BUN 15 10 14 14 15   CREATININE 1.55* 1.35* 1.50* 1.36* 1.50*  CALCIUM 8.9 8.8* 7.8* 7.8* 7.5*  MG  --   --  2.1  --   --    GFR: Estimated Creatinine Clearance: 51.6 mL/min (A) (by C-G formula based on SCr of 1.5 mg/dL (H)). Liver Function Tests: Recent Labs  Lab 07/06/23 0239  AST 19  ALT 13  ALKPHOS 52  BILITOT 0.6  PROT 6.1*  ALBUMIN 2.6*   No results for input(s): "LIPASE", "AMYLASE" in the last 168 hours. No results for input(s): "AMMONIA" in the last 168 hours. Coagulation Profile: No results for input(s): "INR", "PROTIME" in the last 168 hours. Cardiac Enzymes: No results for input(s): "CKTOTAL", "CKMB", "CKMBINDEX", "TROPONINI" in the last 168 hours. BNP (last 3 results) No results for input(s): "PROBNP" in the last 8760 hours. HbA1C: No results for input(s): "HGBA1C" in the last 72 hours. CBG: No results for input(s): "GLUCAP" in the last 168 hours. Lipid Profile: No results for input(s): "CHOL", "HDL", "LDLCALC", "TRIG", "CHOLHDL", "LDLDIRECT" in the last 72 hours. Thyroid Function Tests: No results for input(s): "TSH", "T4TOTAL", "FREET4", "T3FREE", "THYROIDAB" in the last 72 hours. Anemia Panel: No results for input(s): "VITAMINB12", "FOLATE", "FERRITIN", "TIBC", "IRON", "RETICCTPCT" in the last 72 hours. Sepsis Labs: Recent  Labs  Lab 07/05/23 0136 07/05/23 0358 07/06/23 0239  PROCALCITON  --   --  <0.10  LATICACIDVEN 1.5 1.0  --     Recent Results (from the past 240 hour(s))  Resp panel by RT-PCR (RSV, Flu A&B, Covid) Anterior Nasal Swab     Status: None   Collection Time: 07/05/23  1:20 AM   Specimen: Anterior Nasal Swab  Result Value Ref Range Status   SARS Coronavirus 2 by RT PCR NEGATIVE NEGATIVE Final   Influenza A by PCR NEGATIVE NEGATIVE Final   Influenza B by PCR NEGATIVE NEGATIVE Final    Comment: (NOTE) The Xpert Xpress SARS-CoV-2/FLU/RSV plus assay is intended as an aid in the diagnosis of influenza from Nasopharyngeal swab specimens and should not be used as a sole  basis for treatment. Nasal washings and aspirates are unacceptable for Xpert Xpress SARS-CoV-2/FLU/RSV testing.  Fact Sheet for Patients: BloggerCourse.com  Fact Sheet for Healthcare Providers: SeriousBroker.it  This test is not yet approved or cleared by the [place] FDA and has been authorized for detection and/or diagnosis of SARS-CoV-2 by FDA under an Emergency Use Authorization (EUA). This EUA will remain in effect (meaning this test can be used) for the duration of the COVID-19 declaration under Section 564(b)(1) of the Act, 21 U.S.C. section 360bbb-3(b)(1), unless the authorization is terminated or revoked.     Resp Syncytial Virus by PCR NEGATIVE NEGATIVE Final    Comment: (NOTE) Fact Sheet for Patients: BloggerCourse.com  Fact Sheet for Healthcare Providers: SeriousBroker.it  This test is not yet approved or cleared by the [place] FDA and has been authorized for detection and/or diagnosis of SARS-CoV-2 by FDA under an Emergency Use Authorization (EUA). This EUA will remain in effect (meaning this test can be used) for the duration of the COVID-19 declaration under Section 564(b)(1) of the Act, 21  U.S.C. section 360bbb-3(b)(1), unless the authorization is terminated or revoked.  Performed at [facility], [address], [place], [place] [address]   Blood culture (routine x 2)     Status: None (Preliminary result)   Collection Time: 07/05/23  1:36 AM   Specimen: BLOOD LEFT HAND  Result Value Ref Range Status   Specimen Description BLOOD LEFT HAND  Final   Special Requests   Final    BOTTLES DRAWN AEROBIC AND ANAEROBIC Blood Culture adequate volume   Culture   Final    NO GROWTH 3 DAYS Performed at [facility], [address] [place], [place] [address]    Report Status PENDING  Incomplete  Blood culture (routine x 2)     Status: None (Preliminary result)   Collection Time: 07/05/23  1:44 AM   Specimen: BLOOD  Result Value Ref Range Status   Specimen Description BLOOD SITE NOT SPECIFIED  Final   Special Requests   Final    BOTTLES DRAWN AEROBIC AND ANAEROBIC Blood Culture results may not be optimal due to an excessive volume of blood received in culture bottles   Culture   Final    NO GROWTH 3 DAYS Performed at [facility], [address], [place], [place] [address]    Report Status PENDING  Incomplete  MRSA Next Gen by PCR, Nasal     Status: None   Collection Time: 07/05/23 11:00 AM  Result Value Ref Range Status   MRSA by PCR Next Gen NOT DETECTED NOT DETECTED Final    Comment: (NOTE) The GeneXpert MRSA Assay (FDA approved for NASAL specimens only), is one component of a comprehensive MRSA colonization surveillance program. It is not intended to diagnose MRSA infection nor to guide or monitor treatment for MRSA infections. Test performance is not FDA approved in patients less than 57 years old. Performed at [facility], [address] [place], [place] [address]          Radiology Studies: No results found.      Scheduled Meds:  allopurinol  50 mg Oral Daily   apixaban  2.5 mg Oral BID   carvedilol  25 mg Oral BID    gabapentin  300 mg Oral TID   hydrALAZINE  50 mg Oral Q8H   levothyroxine  50 mcg Oral Daily   losartan  100 mg Oral Daily   predniSONE  5 mg Oral Q breakfast   sodium chloride flush  3 mL Intravenous Q12H   Continuous Infusions:  sodium chloride       LOS: 3 days    Time spent: 45 minutes spent on chart review, discussion with nursing staff, consultants, updating family and interview/physical exam; more than 50% of that time was spent in counseling and/or coordination of care.    Joseph Art, DO [facility] Available via Epic secure chat 7am-7pm After these hours, please refer to coverage provider listed on [email or web address] 07/08/2023, 11:01 AM

## 2023-07-08 NOTE — Care Management Important Message (Signed)
Important Message  Patient Details  Name: Terry [name] MRN: [id number] Date of Birth: [birth year]   Medicare Important Message Given:  Yes     Caretha Rumbaugh 07/08/2023, 1:15 PM

## 2023-07-09 DIAGNOSIS — C7951 Secondary malignant neoplasm of bone: Secondary | ICD-10-CM | POA: Diagnosis not present

## 2023-07-09 DIAGNOSIS — Z515 Encounter for palliative care: Secondary | ICD-10-CM | POA: Diagnosis not present

## 2023-07-09 DIAGNOSIS — R531 Weakness: Secondary | ICD-10-CM | POA: Diagnosis not present

## 2023-07-09 DIAGNOSIS — C61 Malignant neoplasm of prostate: Secondary | ICD-10-CM | POA: Diagnosis not present

## 2023-07-09 LAB — CBC
HCT: 32.9 % — ABNORMAL LOW (ref 39.0–52.0)
Hemoglobin: 10.1 g/dL — ABNORMAL LOW (ref 13.0–17.0)
MCH: 24.8 pg — ABNORMAL LOW (ref 26.0–34.0)
MCHC: 30.7 g/dL (ref 30.0–36.0)
MCV: 80.8 fL (ref 80.0–100.0)
Platelets: 194 10*3/uL (ref 150–400)
RBC: 4.07 MIL/uL — ABNORMAL LOW (ref 4.22–5.81)
RDW: 17.8 % — ABNORMAL HIGH (ref 11.5–15.5)
WBC: 6.6 10*3/uL (ref 4.0–10.5)
nRBC: 0 % (ref 0.0–0.2)

## 2023-07-09 LAB — BASIC METABOLIC PANEL
Anion gap: 11 (ref 5–15)
BUN: 16 mg/dL (ref 8–23)
CO2: 24 mmol/L (ref 22–32)
Calcium: 8 mg/dL — ABNORMAL LOW (ref 8.9–10.3)
Chloride: 105 mmol/L (ref 98–111)
Creatinine, Ser: 1.37 mg/dL — ABNORMAL HIGH (ref 0.61–1.24)
GFR, Estimated: 52 mL/min — ABNORMAL LOW (ref >60)
Glucose, Bld: 99 mg/dL (ref 70–99)
Potassium: 3.8 mmol/L (ref 3.5–5.1)
Sodium: 140 mmol/L (ref 135–145)

## 2023-07-09 MED ORDER — FUROSEMIDE 10 MG/ML IJ SOLN
40.0000 mg | Freq: Once | INTRAMUSCULAR | Status: AC
  Administered 2023-07-09: 40 mg via INTRAVENOUS
  Filled 2023-07-09: qty 4

## 2023-07-09 MED ORDER — DICLOFENAC SODIUM 1 % EX GEL
2.0000 g | Freq: Four times a day (QID) | CUTANEOUS | Status: DC
  Administered 2023-07-09 – 2023-07-11 (×9): 2 g via TOPICAL
  Filled 2023-07-09: qty 100

## 2023-07-09 NOTE — Progress Notes (Signed)
PROGRESS NOTE    Terry [name]  WGN:[id number] DOB: 1941-04-03 DOA: 07/04/2023 PCP: Donita Brooks, MD    Brief Narrative:  Terry [name] is an 82 y.o. male with a history of metastatic prostate cancer, iron deficiency anemia, CKD 3, HTN, PAF on Eliquis, diastolic CHF, chronic R ureteral stone, and hypothyroidism who presented to the ER via EMS with left leg pain.  He reportedly slid out of his bed 24 hours prior.  He was unable to ambulate due to the pain, and generalized weakness.  Blood pressure was elevated at 190/100 at presentation.  He reported persistent severe deconditioning and an inability to care for himself since the time of his discharge from a skilled nursing facility about 5 days before.    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   Weight as of this encounter: 140.8 kg.    DVT prophylaxis: Place and maintain sequential compression device Start: 07/08/23 0920 apixaban (ELIQUIS) tablet 2.5 mg Start: 07/05/23 1000 apixaban (ELIQUIS) tablet 2.5 mg    Code Status: Full Code Family Communication: patient wants only [name] updated  Disposition Plan:  Level of care: Med-Surg Status is: Inpatient Remains inpatient appropriate     Consultants:  TOC  Subjective: Still with sore toes  Objective: Vitals:   07/08/23 2002 07/09/23 0354 07/09/23 0547 07/09/23 0720  BP: (!) 140/57 (!) 151/81 (!) 153/81 (!) 157/85  Pulse: 67 65  65  Resp: 20 19  16   Temp: 98.7 F (37.1 C) 97.6 F (36.4 C)  97.8 F (36.6 C)  TempSrc: Oral Oral  Oral  SpO2: 98% 99%  100%  Weight:      Height:        Intake/Output Summary (Last 24 hours) at 07/09/2023 1118 Last data filed  at 07/09/2023 1039 Gross per 24 hour  Intake --  Output 2150 ml  Net -2150 ml   Filed Weights   07/04/23 1328 07/06/23 0500  Weight: 136.1 kg (!) 140.8 kg    Examination:   General: Appearance:    Severely obese male in no acute distress     Lungs:     respirations unlabored  Heart:    Normal heart rate.    MS:   All extremities are intact.    Neurologic:   Awake, alert, + edema (lymph appearing)       Data Reviewed: I have personally reviewed following  labs and imaging studies  CBC: Recent Labs  Lab 07/05/23 0114 07/06/23 0239 07/07/23 1051 07/08/23 0427 07/09/23 0412  WBC 6.0 6.2 5.5 6.0 6.6  NEUTROABS 4.2  --   --   --   --   HGB 11.2* 10.9* 10.1* 9.5* 10.1*  HCT 37.1* 35.7* 33.2* 31.1* 32.9*  MCV 80.0 78.8* 81.0 81.0 80.8  PLT 168 144* 179 177 194   Basic Metabolic Panel: Recent Labs  Lab 07/05/23 0114 07/06/23 0239 07/07/23 1051 07/08/23 0427 07/09/23 0412  NA 142 139 140 139 140  K 3.6 3.7 2.9* 3.3* 3.8  CL 105 104 105 106 105  CO2 27 26 26 28 24   GLUCOSE 96 95 148* 99 99  BUN 10 14 14 15 16   CREATININE 1.35* 1.50* 1.36* 1.50* 1.37*  CALCIUM 8.8* 7.8* 7.8* 7.5* 8.0*  MG  --  2.1  --   --   --    GFR: Estimated Creatinine Clearance: 56.4 mL/min (A) (by C-G formula based on SCr of 1.37 mg/dL (H)). Liver Function Tests: Recent Labs  Lab 07/06/23 0239  AST 19  ALT 13  ALKPHOS 52  BILITOT 0.6  PROT 6.1*  ALBUMIN 2.6*   No results for input(s): "LIPASE", "AMYLASE" in the last 168 hours. No results for input(s): "AMMONIA" in the last 168 hours. Coagulation Profile: No results for input(s): "INR", "PROTIME" in the last 168 hours. Cardiac Enzymes: No results for input(s): "CKTOTAL", "CKMB", "CKMBINDEX", "TROPONINI" in the last 168 hours. BNP (last 3 results) No results for input(s): "PROBNP" in the last 8760 hours. HbA1C: No results for input(s): "HGBA1C" in the last 72 hours. CBG: No results for input(s): "GLUCAP" in the last 168 hours. Lipid Profile: No results for input(s): "CHOL", "HDL", "LDLCALC", "TRIG", "CHOLHDL", "LDLDIRECT" in the last 72 hours. Thyroid Function Tests: No results for input(s): "TSH", "T4TOTAL", "FREET4", "T3FREE", "THYROIDAB" in the last 72 hours. Anemia Panel: No results for input(s): "VITAMINB12", "FOLATE", "FERRITIN", "TIBC", "IRON", "RETICCTPCT" in the last 72 hours. Sepsis Labs: Recent Labs  Lab 07/05/23 0136 07/05/23 0358 07/06/23 0239  PROCALCITON  --   --  <0.10   LATICACIDVEN 1.5 1.0  --     Recent Results (from the past 240 hour(s))  Resp panel by RT-PCR (RSV, Flu A&B, Covid) Anterior Nasal Swab     Status: None   Collection Time: 07/05/23  1:20 AM   Specimen: Anterior Nasal Swab  Result Value Ref Range Status   SARS Coronavirus 2 by RT PCR NEGATIVE NEGATIVE Final   Influenza A by PCR NEGATIVE NEGATIVE Final   Influenza B by PCR NEGATIVE NEGATIVE Final    Comment: (NOTE) The Xpert Xpress SARS-CoV-2/FLU/RSV plus assay is intended as an aid in the diagnosis of influenza from Nasopharyngeal swab specimens and should not be used as a sole basis for treatment. Nasal washings and aspirates are unacceptable  for Xpert Xpress SARS-CoV-2/FLU/RSV testing.  Fact Sheet for Patients: BloggerCourse.com  Fact Sheet for Healthcare Providers: SeriousBroker.it  This test is not yet approved or cleared by the [place] FDA and has been authorized for detection and/or diagnosis of SARS-CoV-2 by FDA under an Emergency Use Authorization (EUA). This EUA will remain in effect (meaning this test can be used) for the duration of the COVID-19 declaration under Section 564(b)(1) of the Act, 21 U.S.C. section 360bbb-3(b)(1), unless the authorization is terminated or revoked.     Resp Syncytial Virus by PCR NEGATIVE NEGATIVE Final    Comment: (NOTE) Fact Sheet for Patients: BloggerCourse.com  Fact Sheet for Healthcare Providers: SeriousBroker.it  This test is not yet approved or cleared by the [place] FDA and has been authorized for detection and/or diagnosis of SARS-CoV-2 by FDA under an Emergency Use Authorization (EUA). This EUA will remain in effect (meaning this test can be used) for the duration of the COVID-19 declaration under Section 564(b)(1) of the Act, 21 U.S.C. section 360bbb-3(b)(1), unless the authorization is terminated  or revoked.  Performed at [facility], [address], [place], [place] [address]   Blood culture (routine x 2)     Status: None (Preliminary result)   Collection Time: 07/05/23  1:36 AM   Specimen: BLOOD LEFT HAND  Result Value Ref Range Status   Specimen Description BLOOD LEFT HAND  Final   Special Requests   Final    BOTTLES DRAWN AEROBIC AND ANAEROBIC Blood Culture adequate volume   Culture   Final    NO GROWTH 4 DAYS Performed at [facility], [address] [place], [place] [address]    Report Status PENDING  Incomplete  Blood culture (routine x 2)     Status: None (Preliminary result)   Collection Time: 07/05/23  1:44 AM   Specimen: BLOOD  Result Value Ref Range Status   Specimen Description BLOOD SITE NOT SPECIFIED  Final   Special Requests   Final    BOTTLES DRAWN AEROBIC AND ANAEROBIC Blood Culture results may not be optimal due to an excessive volume of blood received in culture bottles   Culture   Final    NO GROWTH 4 DAYS Performed at [facility], [address] [place], [place] [address]    Report Status PENDING  Incomplete  MRSA Next Gen by PCR, Nasal     Status: None   Collection Time: 07/05/23 11:00 AM  Result Value Ref Range Status   MRSA by PCR Next Gen NOT DETECTED NOT DETECTED Final    Comment: (NOTE) The GeneXpert MRSA Assay (FDA approved for NASAL specimens only), is one component of a comprehensive MRSA colonization surveillance program. It is not intended to diagnose MRSA infection nor to guide or monitor treatment for MRSA infections. Test performance is not FDA approved in patients less than 36 years old. Performed at [facility], [address] [place], [place] [address]          Radiology Studies: No results found.      Scheduled Meds:  allopurinol  50 mg Oral Daily   apixaban  2.5 mg Oral BID   carvedilol  25 mg Oral BID   diclofenac Sodium  2 g Topical QID   gabapentin  300 mg Oral TID    hydrALAZINE  50 mg Oral Q8H   levothyroxine  50 mcg Oral Daily   losartan  100 mg Oral Daily   predniSONE  5 mg Oral Q breakfast   sodium chloride flush  3 mL Intravenous Q12H   Continuous Infusions:  sodium chloride       LOS: 4 days    Time spent: 45 minutes spent on chart review, discussion with nursing staff, consultants, updating family and interview/physical exam; more than 50% of that time was spent in counseling and/or coordination of care.    Joseph Art, DO [facility] Available via Epic secure chat 7am-7pm After these hours, please refer to coverage provider listed on [email or web address] 07/09/2023, 11:18 AM

## 2023-07-09 NOTE — Plan of Care (Signed)
  Problem: Activity: Goal: Risk for activity intolerance will decrease 07/09/2023 0631 by Quenton Fetter, RN Outcome: Not Progressing Note: Patient has a very difficult time sitting to standing.  He is only able to stand and pivot taking two to three steps.  Difficulty in raising his feet.   07/09/2023 1610 by Quenton Fetter, RN Outcome: Adequate for Discharge   Problem: Elimination: Goal: Will not experience complications related to urinary retention Outcome: Adequate for Discharge Note: Good urine output noted.  Patient is taking in adequate amounts of fluid.     Problem: Pain Managment: Goal: General experience of comfort will improve 07/09/2023 0631 by Quenton Fetter, RN Note: Patient has denied pain this shift.  07/09/2023 9604 by Quenton Fetter, RN Outcome: Adequate for Discharge   Problem: Safety: Goal: Ability to remain free from injury will improve Outcome: Adequate for Discharge   Problem: Skin Integrity: Goal: Risk for impaired skin integrity will decrease Outcome: Adequate for Discharge   Problem: Elimination: Goal: Will not experience complications related to urinary retention Outcome: Adequate for Discharge Note: Good urine output noted.  Patient is taking in adequate amounts of fluid.

## 2023-07-09 NOTE — Consult Note (Signed)
Palliative Medicine  Name: Terry [name] Date: 07/09/2023 MRN: [id number]  DOB: October 23, 1941  Patient Care Team: Donita Brooks, MD as PCP - General (Family Medicine) Mariah Milling Tollie Pizza, MD as Consulting Physician (Cardiology) Erroll Luna, [facility] (Inactive) as Pharmacist (Pharmacist) Malachy Mood, MD as Consulting Physician (Oncology)    REASON FOR CONSULTATION: Terry [name] is a 82 y.o. 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  Patient admitted to the hospital with progressive weakness/failure to thrive.  CT of abdomen and pelvis concerning for cancer progression with worsening lymphadenopathy and diffuse osseous metastatic disease.  Palliative care was consulted to address goals.  SOCIAL HISTORY:     reports that he has never smoked. He has never been exposed to tobacco smoke. He has never used smokeless tobacco. He reports that he does not currently use alcohol. He reports that he does not currently use drugs after having used the following drugs: Marijuana.  Patient lives at home with his daughter.  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  ADVANCE DIRECTIVES:  Not on file  CODE STATUS: Full code  PAST MEDICAL HISTORY: Past Medical History:  Diagnosis Date   Arthritis    Cancer (HCC)    Phreesia 08/04/2020   CHF, chronic (HCC)    diastolic   CKD (chronic kidney disease) stage 3, GFR 30-59 ml/min (HCC)    Diabetes mellitus without complication (HCC)    Dysrhythmia    Hypertension    Hypothyroidism    Lymphedema    Paroxysmal atrial fibrillation (HCC)    Prostate cancer (HCC)    Renal insufficiency     PAST SURGICAL HISTORY:  Past Surgical History:  Procedure Laterality Date   COLON SURGERY N/A    Phreesia 08/04/2020    HEMATOLOGY/ONCOLOGY HISTORY:  Oncology History Overview Note   Cancer Staging  Prostate cancer [facility]) Staging form: Prostate,  AJCC 8th Edition - Clinical stage from 09/27/2021: Stage IVB (cTX, cNX, cM1b) - Signed by Creig Hines, MD on 09/27/2021     Prostate cancer (HCC)  08/01/2020 Initial Diagnosis   Prostate cancer (HCC)   09/27/2021 Cancer Staging   Staging form: Prostate, AJCC 8th Edition - Clinical stage from 09/27/2021: Stage IVB (cTX, cNX, cM1b) - Signed by Creig Hines, MD on 09/27/2021   01/13/2023 Imaging    IMPRESSION: 1. Widespread bony metastatic disease involving axial and appendicular skeleton with marked increased radiotracer accumulation. Some areas with sclerosis and other areas with lucency or without CT correlate as discussed. Metastatic disease is much more widespread based on comparison with prior imaging which was not performed with PSMA PET. 2. Enlarging AP window lymph node with moderate radiotracer accumulation, suspicious for metastatic disease. Other lymph node in the RIGHT mediastinum without change with mild radiotracer accumulation. 3. Marked radiotracer accumulation in the prostate. 4. Small lymph nodes scattered throughout the pelvis with radiotracer accumulation that is mild-to-moderate, nonspecific but suspicious given other findings. Attention on follow-up. This includes bilateral groin lymph nodes largest on the RIGHT. 5. 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 6. Signs of prior colonic resection in the LEFT hemiabdomen.     ALLERGIES:  is allergic to lisinopril.  MEDICATIONS:  Current Facility-Administered Medications  Medication Dose Route Frequency Provider Last Rate Last Admin   0.9 %  sodium chloride infusion   Intravenous Continuous Lonia Blood, MD  acetaminophen (TYLENOL) tablet 650 mg  650 mg Oral Q6H PRN Russella Dar, NP       allopurinol (ZYLOPRIM) tablet 50 mg  50 mg Oral Daily Russella Dar, NP   50 mg at 07/09/23 0910   apixaban (ELIQUIS) tablet 2.5 mg  2.5 mg Oral BID Russella Dar, NP    2.5 mg at 07/09/23 0911   carvedilol (COREG) tablet 25 mg  25 mg Oral BID Russella Dar, NP   25 mg at 07/09/23 7829   diclofenac Sodium (VOLTAREN) 1 % topical gel 2 g  2 g Topical QID Vann, Jessica U, DO       gabapentin (NEURONTIN) capsule 300 mg  300 mg Oral TID Russella Dar, NP   300 mg at 07/09/23 0910   hydrALAZINE (APRESOLINE) tablet 50 mg  50 mg Oral Q8H Russella Dar, NP   50 mg at 07/09/23 0546   levothyroxine (SYNTHROID) tablet 50 mcg  50 mcg Oral Daily Russella Dar, NP   50 mcg at 07/09/23 0546   losartan (COZAAR) tablet 100 mg  100 mg Oral Daily Russella Dar, NP   100 mg at 07/09/23 0910   morphine (PF) 2 MG/ML injection 1 mg  1 mg Intravenous Q2H PRN Russella Dar, NP       ondansetron [facility]) tablet 4 mg  4 mg Oral Q6H PRN Russella Dar, NP       Or   ondansetron [facility]) injection 4 mg  4 mg Intravenous Q6H PRN Russella Dar, NP       oxyCODONE (Oxy IR/ROXICODONE) immediate release tablet 5-10 mg  5-10 mg Oral Q4H PRN Russella Dar, NP   10 mg at 07/05/23 1641   predniSONE (DELTASONE) tablet 5 mg  5 mg Oral Q breakfast Russella Dar, NP   5 mg at 07/09/23 0910   sodium chloride flush (NS) 0.9 % injection 3 mL  3 mL Intravenous Q12H Russella Dar, NP   3 mL at 07/09/23 0911   traMADol (ULTRAM) tablet 50 mg  50 mg Oral Q8H PRN Russella Dar, NP       Facility-Administered Medications Ordered in Other Encounters  Medication Dose Route Frequency Provider Last Rate Last Admin   leuprolide (6 Month) (ELIGARD) injection 45 mg  45 mg Subcutaneous Q6 months Creig Hines, MD   45 mg at 12/28/22 1549    VITAL SIGNS: BP (!) 157/85 (BP Location: Right Arm)   Pulse 65   Temp 97.8 F (36.6 C) (Oral)   Resp 16   Ht 5\' 7"  (1.702 m)   Wt (!) 310 lb 6.5 oz (140.8 kg)   SpO2 100%   BMI 48.62 kg/m  Filed Weights   07/04/23 1328 07/06/23 0500  Weight: 300 lb (136.1 kg) (!) 310 lb 6.5 oz (140.8 kg)    Estimated body mass index is 48.62 kg/m as  calculated from the following:   Height as of this encounter: 5\' 7"  (1.702 m).   Weight as of this encounter: 310 lb 6.5 oz (140.8 kg).  LABS: CBC:    Component Value Date/Time   WBC 6.6 07/09/2023 0412   HGB 10.1 (L) 07/09/2023 0412   HGB 10.5 (L) 06/29/2023 1206   HCT 32.9 (L) 07/09/2023 0412   PLT 194 07/09/2023 0412   PLT 174 06/29/2023 1206   MCV 80.8 07/09/2023 0412   NEUTROABS 4.2 07/05/2023 0114   LYMPHSABS 0.9 07/05/2023 0114  MONOABS 0.7 07/05/2023 0114   EOSABS 0.1 07/05/2023 0114   BASOSABS 0.0 07/05/2023 0114   Comprehensive Metabolic Panel:    Component Value Date/Time   NA 140 07/09/2023 0412   K 3.8 07/09/2023 0412   CL 105 07/09/2023 0412   CO2 24 07/09/2023 0412   BUN 16 07/09/2023 0412   CREATININE 1.37 (H) 07/09/2023 0412   CREATININE 1.55 (H) 07/01/2023 1242   GLUCOSE 99 07/09/2023 0412   CALCIUM 8.0 (L) 07/09/2023 0412   AST 19 07/06/2023 0239   AST 13 (L) 06/29/2023 1206   ALT 13 07/06/2023 0239   ALT 9 06/29/2023 1206   ALKPHOS 52 07/06/2023 0239   BILITOT 0.6 07/06/2023 0239   BILITOT 0.4 06/29/2023 1206   PROT 6.1 (L) 07/06/2023 0239   ALBUMIN 2.6 (L) 07/06/2023 0239    RADIOGRAPHIC STUDIES: VAS [place] LOWER EXTREMITY VENOUS (DVT)  Result Date: 07/06/2023  Lower Venous DVT Study Patient Name:  QUAMANE DEARMON  Date of Exam:   07/06/2023 Medical Rec #: [id number]      Accession #:    [id number] Date of Birth: 1941/06/02      Patient Gender: M Patient Age:   58 years Exam Location:  [facility] Procedure:      VAS [place] LOWER EXTREMITY VENOUS (DVT) Referring Phys: JEFFREY MCCLUNG --------------------------------------------------------------------------------  Indications: Swelling.  Risk Factors: Cancer -prostate. Anticoagulation: Eliquis. Limitations: Body habitus and poor ultrasound/tissue interface. Comparison Study: No previous study. Performing Technologist: McKayla Maag RVT, [place]  Examination Guidelines: A complete evaluation includes B-mode  imaging, spectral Doppler, color Doppler, and power Doppler as needed of all accessible portions of each vessel. Bilateral testing is considered an integral part of a complete examination. Limited examinations for reoccurring indications may be performed as noted. The reflux portion of the exam is performed with the patient in reverse Trendelenburg.  +--------+---------------+---------+-----------+----------+--------------------+ RIGHT   CompressibilityPhasicitySpontaneityPropertiesThrombus Aging       +--------+---------------+---------+-----------+----------+--------------------+ CFV     Full           Yes      Yes                                       +--------+---------------+---------+-----------+----------+--------------------+ SFJ     Full                                                              +--------+---------------+---------+-----------+----------+--------------------+ FV Prox Full                                                              +--------+---------------+---------+-----------+----------+--------------------+ FV Mid  Full                                                              +--------+---------------+---------+-----------+----------+--------------------+ FV  Yes      Yes                  patent by color      Distal                                               doppler              +--------+---------------+---------+-----------+----------+--------------------+ PFV     Full                                                              +--------+---------------+---------+-----------+----------+--------------------+ POP     Full           Yes      Yes                                       +--------+---------------+---------+-----------+----------+--------------------+ PTV     Full                                         Limited               +--------+---------------+---------+-----------+----------+--------------------+ PERO                                                 Not visualized       +--------+---------------+---------+-----------+----------+--------------------+   +--------+---------------+---------+-----------+----------+--------------------+ LEFT    CompressibilityPhasicitySpontaneityPropertiesThrombus Aging       +--------+---------------+---------+-----------+----------+--------------------+ CFV     Full           Yes      Yes                                       +--------+---------------+---------+-----------+----------+--------------------+ SFJ     Full                                                              +--------+---------------+---------+-----------+----------+--------------------+ FV Prox Full                                                              +--------+---------------+---------+-----------+----------+--------------------+ FV Mid  Full                                                              +--------+---------------+---------+-----------+----------+--------------------+  FV                     Yes      Yes                  patent by color      Distal                                               doppler              +--------+---------------+---------+-----------+----------+--------------------+ PFV     Full                                                              +--------+---------------+---------+-----------+----------+--------------------+ POP     Full           Yes      Yes                                       +--------+---------------+---------+-----------+----------+--------------------+ PTV                                                  Not visualized       +--------+---------------+---------+-----------+----------+--------------------+ PERO                                                 Not visualized        +--------+---------------+---------+-----------+----------+--------------------+     Summary: RIGHT: - There is no evidence of deep vein thrombosis in the lower extremity. However, portions of this examination were limited- see technologist comments above.  - No cystic structure found in the popliteal fossa.  LEFT: - There is no evidence of deep vein thrombosis in the lower extremity. However, portions of this examination were limited- see technologist comments above.  - No cystic structure found in the popliteal fossa.  *See table(s) above for measurements and observations. Electronically signed by Heath Lark on 07/06/2023 at 8:52:53 PM.    Final    CT ABDOMEN PELVIS W CONTRAST  Result Date: 07/05/2023 CLINICAL DATA:  82 year old male with history of acute onset of nonlocalized abdominal pain. History of metastatic prostate cancer. * Tracking Code: BO * EXAM: CT ABDOMEN AND PELVIS WITH CONTRAST TECHNIQUE: Multidetector CT imaging of the abdomen and pelvis was performed using the standard protocol following bolus administration of intravenous contrast. RADIATION DOSE REDUCTION: This exam was performed according to the departmental dose-optimization program which includes automated exposure control, adjustment of the mA and/or kV according to patient size and/or use of iterative reconstruction technique. CONTRAST:  75mL OMNIPAQUE IOHEXOL 350 MG/ML SOLN COMPARISON:  CT the abdomen and pelvis 09/30/2021. PET-CT 01/13/2023. FINDINGS: Lower chest: Unremarkable. Hepatobiliary: Subcentimeter low-attenuation lesion in segment 4B of the liver adjacent to the falciform ligament, too small to definitively  characterize, but similar to prior studies and statistically likely a cyst (no imaging follow-up recommended). No other suspicious appearing hepatic lesions are noted. No intra or extrahepatic biliary ductal dilatation. Faint densities lie dependently in the gallbladder, likely biliary sludge balls or noncalcified  gallstones. Gallbladder is nondistended. No pericholecystic fluid or surrounding inflammatory changes. Pancreas: No pancreatic mass. No pancreatic ductal dilatation. No pancreatic or peripancreatic fluid collections or inflammatory changes. Spleen: Unremarkable. Adrenals/Urinary Tract: Severe atrophy of the right kidney again noted. Right-sided double-J ureteral stent in position with proximal loop reformed in the right renal pelvis and distal loop reformed in the urinary bladder. Severe chronic hydroureteronephrosis is minimally increased compared to prior study, terminating in the mid ureter where there is a 9 mm calculus adjacent to the stent (axial image 52 of series 3). Several curvilinear calcifications are also noted in the lower pole of the right kidney, some of which may represent calculi, while others may represent parenchymal calcifications. Left kidney and bilateral adrenal glands are normal in appearance. Urinary bladder is otherwise unremarkable in appearance. Stomach/Bowel: The appearance of the stomach is normal. There is no pathologic dilatation of small bowel or colon. Normal appendix. Vascular/Lymphatic: No significant atherosclerotic disease, aneurysm or dissection noted in the abdominal or pelvic vasculature. Multiple prominent borderline enlarged and mildly enlarged retroperitoneal and pelvic lymph nodes are noted. Clear enlargement of a left para-aortic lymph node adjacent to the left renal hilum (axial image 36 of series 3) which currently measures 1.7 cm in short axis (previously nonenlarged), concerning for metastatic disease. Other prominent retrocaval lymph node (axial image 33 of series 3) measuring 1.1 cm in short axis, and left inguinal lymph node (axial image 78 of series 3) measuring 11 mm in short axis also noted. Reproductive: Prostate gland and seminal vesicles are grossly unremarkable in appearance. Other: No significant volume of ascites.  No pneumoperitoneum. Musculoskeletal:  Several sclerotic lesions are again noted in the visualized axial and appendicular skeleton, indicative of metastatic disease to the bones in this patient with history of prostate cancer. This appears progressive, with enlargement of several lesions (best example is in the right side of S1 where there is a 1.9 cm sclerotic lesion which previously measured only 1 cm). The largest lesion is in the left side of the L5 vertebral body (axial image 49 of series 3) currently measuring 4.2 x 3.5 cm. IMPRESSION: 1. 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 2. No other definite acute findings noted in the abdomen or pelvis to account for the patient's symptoms. 3. 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 Increasing lymphadenopathy, most evident in the retroperitoneum also suspicious for metastatic disease in this patient with documented history of prostate cancer. 4. Probable noncalcified gallstones or biliary sludge balls in the gallbladder. No findings to suggest an acute cholecystitis at this time. 5. Additional incidental findings, as above. Electronically Signed   By: Trudie Reed M.D.   On: 07/05/2023 05:24   DG Knee 2 Views Left  Result Date: 07/05/2023 CLINICAL DATA:  Fall, left knee pain EXAM: LEFT KNEE - 1-2 VIEW COMPARISON:  None Available. 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 Mild overall straightening. No acute fracture or dislocation. Moderate left knee effusion. Mild diffuse subcutaneous edema of the visualized left lower extremity. IMPRESSION: 1. Severe tricompartmental degenerative arthritis. 2. Moderate left knee  effusion. Electronically Signed   By: Helyn Numbers M.D.   On: 07/05/2023 03:42   CT Head Wo Contrast  Result Date: 07/04/2023 CLINICAL DATA:  Fall,  trauma. EXAM: CT HEAD WITHOUT CONTRAST TECHNIQUE: Contiguous axial images were obtained from the base of the skull through the vertex without intravenous contrast. RADIATION DOSE REDUCTION: This exam was performed according to the departmental dose-optimization program which includes automated exposure control, adjustment of the mA and/or kV according to patient size and/or use of iterative reconstruction technique. COMPARISON:  None Available. FINDINGS: Brain: No evidence of acute infarction, hemorrhage, hydrocephalus, extra-axial collection or mass lesion/mass effect. There is mild diffuse atrophy and mild periventricular white matter hypodensity. Vascular: Atherosclerotic calcifications are present within the cavernous internal carotid arteries. Skull: Normal. Negative for fracture or focal lesion. Sinuses/Orbits: No acute finding. Other: None. IMPRESSION: 1. No acute intracranial abnormality. 2. 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 Electronically Signed   By: Darliss Cheney M.D.   On: 07/04/2023 23:59   DG Chest 2 View  Result Date: 07/04/2023 CLINICAL DATA:  Cough and fall EXAM: CHEST - 2 VIEW COMPARISON:  Radiographs 05/31/2023 FINDINGS: Stable cardiomegaly. Posterior lower lobe consolidation on lateral view compatible with pneumonia. No correlate is seen on AP view. No pleural effusion or pneumothorax. No displaced rib fractures. IMPRESSION: Lower lobe pneumonia seen on lateral view. Electronically Signed   By: Minerva Fester M.D.   On: 07/04/2023 23:51   DG Lumbar Spine Complete  Result Date: 07/04/2023 CLINICAL DATA:  Fall, back pain EXAM: LUMBAR SPINE - COMPLETE 4+ VIEW COMPARISON:  PET-CT 01/13/2023 and lumbar radiographs 09/24/2011 FINDINGS: Right double-J ureteral stent noted. Possible stones along the stent at the level of the iliac crest. Sclerosis in the lower lumbar spine compatible with metastatic lesions. Body habitus  reduces diagnostic sensitivity and specificity. Poor cortical definition in some areas due to body habitus. Lower thoracic spondylosis. I do not see a definite fracture or acute subluxation. IMPRESSION: 1. No definite fracture or acute subluxation. Substantially reduced sensitivity due to body habitus, consider CT if there is a high clinical index of suspicion. 2. Sclerosis in the lower lumbar spine compatible with metastatic lesions. 3. Right double-J ureteral stent with possible stones along the stent at the level of the iliac crest. Electronically Signed   By: Gaylyn Rong M.D.   On: 07/04/2023 16:36   DG Hip Unilat With Pelvis 2-3 Views Left  Result Date: 07/04/2023 CLINICAL DATA:  Fall, injury EXAM: DG HIP (WITH OR WITHOUT PELVIS) 2-3V LEFT COMPARISON:  PET-CT 01/13/2023 FINDINGS: Distal portion of a right double-J ureteral stent is noted with loop formed in the urinary bladder. Cannot exclude stones or concretions adjacent to the tube at the level of the right iliac crest. Sclerotic lesions in the pelvis, most are plea notable in the right ischium, compatible with metastatic lesions. No fracture or acute bony findings identified. IMPRESSION: 1. No acute bony findings. 2. 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 3. Right double-J ureteral stent in place. Cannot exclude stones or concretions adjacent to the tube at the vertical level of the right iliac crest. Electronically Signed   By: Gaylyn Rong M.D.   On: 07/04/2023 16:33    PERFORMANCE STATUS (ECOG) : 3 - Symptomatic, >50% confined to bed  Review of Systems Unless otherwise noted, a complete review of systems is negative.  Physical Exam General: NAD Pulmonary: Unlabored Extremities: no edema, no joint deformities Skin:  no rashes Neurological: Weakness but otherwise nonfocal  IMPRESSION: Patient with metastatic prostate cancer with CT of abdomen and pelvis on 07/05/2023 concerning for progression,  with increased retroperitoneal lymphadenopathy and diffuse osseous metastasis when compared to PSMA PET on 01/13/2023.  However, PSA has been downtrending on treatment with Eligard + Zytiga.  Zytiga recently held by oncology given progressive weakness.  I met with patient today to discuss goals.  Patient says that he was unaware that CT was suggestive of cancer progression or that treatment was with palliative intent.  He tells he use that there is a cure for cancer but patients often do not receive it as it is more lucrative to treat cancer instead of curing it.   He describes a strong [place] faith and belief that God will cure him.  He says that he understands that he will eventually die but he does not believe that he is nearing end-of-life currently.    He says that he is in agreement with current scope of treatment and would want any treatments or procedures necessary to prolong his life.  He wants to remain a full code.  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  I reviewed a MOST form with patient.  He plans to discuss decision making with his daughter.  However, he says after praying about it, he would want to remain a full code/full scope of treatment.  He does verbalize an understanding that in the setting of advanced cancer, aggressive care at end-of-life is unlikely to result in meaningful improvement.  There was previously some discussion about changing his healthcare power of attorney, which reportedly is currently his daughter in [place].  However, patient says that he would need to pray about this decision and discuss with his family as he fears making any changes may cause family conflict.  PLAN: -Continue current scope of treatment -Full code -MOST Form reviewed -Dispo: Probable rehab.  Would recommend palliative care following in that setting -Would also recommend involvement of palliative care  provider at [facility]   Time Total: 60 minutes  Visit consisted of counseling and education dealing with the complex and emotionally intense issues of symptom management and palliative care in the setting of serious and potentially life-threatening illness.Greater than 50%  of this time was spent counseling and coordinating care related to the above assessment and plan.  Signed by: Laurette Schimke, PhD, NP-C

## 2023-07-09 NOTE — Plan of Care (Signed)

## 2023-07-10 DIAGNOSIS — R531 Weakness: Secondary | ICD-10-CM | POA: Diagnosis not present

## 2023-07-10 LAB — BASIC METABOLIC PANEL
Anion gap: 7 (ref 5–15)
BUN: 18 mg/dL (ref 8–23)
CO2: 27 mmol/L (ref 22–32)
Calcium: 8 mg/dL — ABNORMAL LOW (ref 8.9–10.3)
Chloride: 105 mmol/L (ref 98–111)
Creatinine, Ser: 1.27 mg/dL — ABNORMAL HIGH (ref 0.61–1.24)
GFR, Estimated: 56 mL/min — ABNORMAL LOW (ref >60)
Glucose, Bld: 103 mg/dL — ABNORMAL HIGH (ref 70–99)
Potassium: 3.8 mmol/L (ref 3.5–5.1)
Sodium: 139 mmol/L (ref 135–145)

## 2023-07-10 LAB — CULTURE, BLOOD (ROUTINE X 2)
Culture: NO GROWTH
Culture: NO GROWTH
Special Requests: ADEQUATE

## 2023-07-10 MED ORDER — FUROSEMIDE 10 MG/ML IJ SOLN
40.0000 mg | Freq: Once | INTRAMUSCULAR | Status: AC
  Administered 2023-07-10: 40 mg via INTRAVENOUS
  Filled 2023-07-10: qty 4

## 2023-07-10 NOTE — Progress Notes (Signed)
PROGRESS NOTE    Terry [name]  KVQ:[id number] DOB: [birth year] DOA: 07/04/2023 PCP: Donita Brooks, MD    Brief Narrative:  Terry [name] is an 82 y.o. male with a history of metastatic prostate cancer, iron deficiency anemia, CKD 3, HTN, PAF on Eliquis, diastolic CHF, chronic R ureteral stone, and hypothyroidism who presented to the ER via EMS with left leg pain.  He reportedly slid out of his bed 24 hours prior.  He was unable to ambulate due to the pain, and generalized weakness.  Blood pressure was elevated at 190/100 at presentation.  He reported persistent severe deconditioning and an inability to care for himself since the time of his discharge from a skilled nursing facility about 5 days before.    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   Weight as of this encounter: 140.8 kg.    DVT prophylaxis: Place and maintain sequential compression device Start: 07/08/23 0920 apixaban (ELIQUIS) tablet 2.5 mg Start: 07/05/23 1000 apixaban (ELIQUIS) tablet 2.5 mg    Code Status: Full Code   Disposition Plan:  Level of care: Med-Surg Status is: Inpatient Remains inpatient appropriate     Consultants:  TOC  Subjective: Feet feeling better  Objective: Vitals:   07/09/23 1604 07/09/23 2022 07/10/23 0412 07/10/23 0905  BP: (!) 150/78 (!) 151/73 (!) 160/87 (!) 148/59  Pulse: (!) 59 72 66 66  Resp: 18 18 16 20   Temp: 98 F (36.7 C) 98.6 F (37 C) 98.1 F (36.7 C) 98 F (36.7 C)  TempSrc: Oral Oral Oral Oral  SpO2: 97% 95% 96% 96%  Weight:      Height:        Intake/Output Summary (Last 24 hours) at 07/10/2023 1100 Last data filed at 07/09/2023 1343 Gross per 24 hour  Intake --  Output 800 ml  Net -800 ml   Filed Weights   07/04/23 1328 07/06/23 0500  Weight: 136.1 kg (!) 140.8 kg    Examination:   General: Appearance:    Severely obese male in no acute distress     Lungs:     respirations unlabored  Heart:    Normal heart rate.    MS:   All extremities are intact.    Neurologic:   Awake, alert, improved swelling in LE       Data Reviewed: I have personally reviewed following labs and imaging studies  CBC: Recent Labs  Lab 07/05/23 0114 07/06/23 0239 07/07/23 1051  07/08/23 0427 07/09/23 0412  WBC 6.0 6.2 5.5 6.0 6.6  NEUTROABS 4.2  --   --   --   --   HGB 11.2* 10.9* 10.1* 9.5* 10.1*  HCT 37.1* 35.7* 33.2* 31.1* 32.9*  MCV 80.0 78.8* 81.0 81.0 80.8  PLT 168 144* 179 177 194   Basic Metabolic Panel: Recent Labs  Lab 07/06/23 0239 07/07/23 1051 07/08/23 0427 07/09/23 0412 07/10/23 0722  NA 139 140 139 140 139  K 3.7 2.9* 3.3* 3.8 3.8  CL 104 105 106 105 105  CO2 26 26 28 24 27   GLUCOSE 95 148* 99 99 103*  BUN 14 14 15 16 18   CREATININE 1.50* 1.36* 1.50* 1.37* 1.27*  CALCIUM 7.8* 7.8* 7.5* 8.0* 8.0*  MG 2.1  --   --   --   --    GFR: Estimated Creatinine Clearance: 60.9 mL/min (A) (by C-G formula based on SCr of 1.27 mg/dL (H)). Liver Function Tests: Recent Labs  Lab 07/06/23 0239  AST 19  ALT 13  ALKPHOS 52  BILITOT 0.6  PROT 6.1*  ALBUMIN 2.6*   No results for input(s): "LIPASE", "AMYLASE" in the last 168 hours. No results for input(s): "AMMONIA" in the last 168 hours. Coagulation Profile: No results for input(s): "INR", "PROTIME" in the last 168 hours. Cardiac Enzymes: No results for input(s): "CKTOTAL", "CKMB", "CKMBINDEX", "TROPONINI" in the last 168 hours. BNP (last 3 results) No results for input(s): "PROBNP" in the last 8760 hours. HbA1C: No results for input(s): "HGBA1C" in the last 72 hours. CBG: No results for input(s): "GLUCAP" in the last 168 hours. Lipid Profile: No results for input(s): "CHOL", "HDL", "LDLCALC", "TRIG", "CHOLHDL", "LDLDIRECT" in the last 72 hours. Thyroid Function Tests: No results for input(s): "TSH", "T4TOTAL", "FREET4", "T3FREE", "THYROIDAB" in the last 72 hours. Anemia Panel: No results for input(s): "VITAMINB12", "FOLATE", "FERRITIN", "TIBC", "IRON", "RETICCTPCT" in the last 72 hours. Sepsis Labs: Recent Labs  Lab 07/05/23 0136 07/05/23 0358 07/06/23 0239  PROCALCITON  --   --  <0.10  LATICACIDVEN 1.5 1.0  --     Recent Results (from the past 240 hour(s))  Resp panel by  RT-PCR (RSV, Flu A&B, Covid) Anterior Nasal Swab     Status: None   Collection Time: 07/05/23  1:20 AM   Specimen: Anterior Nasal Swab  Result Value Ref Range Status   SARS Coronavirus 2 by RT PCR NEGATIVE NEGATIVE Final   Influenza A by PCR NEGATIVE NEGATIVE Final   Influenza B by PCR NEGATIVE NEGATIVE Final    Comment: (NOTE) The Xpert Xpress SARS-CoV-2/FLU/RSV plus assay is intended as an aid in the diagnosis of influenza from Nasopharyngeal swab specimens and should not be used as a sole basis for treatment. Nasal washings and aspirates are unacceptable for Xpert Xpress SARS-CoV-2/FLU/RSV testing.  Fact Sheet for Patients: BloggerCourse.com  Fact Sheet for  Healthcare Providers: SeriousBroker.it  This test is not yet approved or cleared by the [place] and has been authorized for detection and/or diagnosis of SARS-CoV-2 by FDA under an Emergency Use Authorization (EUA). This EUA will remain in effect (meaning this test can be used) for the duration of the COVID-19 declaration under Section 564(b)(1) of the Act, 21 U.S.C. section 360bbb-3(b)(1), unless the authorization is terminated or revoked.     Resp Syncytial Virus by PCR NEGATIVE NEGATIVE Final    Comment: (NOTE) Fact Sheet for Patients: BloggerCourse.com  Fact Sheet for Healthcare Providers: SeriousBroker.it  This test is not yet approved or cleared by the [place] FDA and has been authorized for detection and/or diagnosis of SARS-CoV-2 by FDA under an Emergency Use Authorization (EUA). This EUA will remain in effect (meaning this test can be used) for the duration of the COVID-19 declaration under Section 564(b)(1) of the Act, 21 U.S.C. section 360bbb-3(b)(1), unless the authorization is terminated or revoked.  Performed at [facility], [address] [place], [place] [address]   Blood  culture (routine x 2)     Status: None   Collection Time: 07/05/23  1:36 AM   Specimen: BLOOD LEFT HAND  Result Value Ref Range Status   Specimen Description BLOOD LEFT HAND  Final   Special Requests   Final    BOTTLES DRAWN AEROBIC AND ANAEROBIC Blood Culture adequate volume   Culture   Final    NO GROWTH 5 DAYS Performed at [facility], [address] [place], [place] [address]    Report Status 07/10/2023 FINAL  Final  Blood culture (routine x 2)     Status: None   Collection Time: 07/05/23  1:44 AM   Specimen: BLOOD  Result Value Ref Range Status   Specimen Description BLOOD SITE NOT SPECIFIED  Final   Special Requests   Final    BOTTLES DRAWN AEROBIC AND ANAEROBIC Blood Culture results may not be optimal due to an excessive volume of blood received in culture bottles   Culture   Final    NO GROWTH 5 DAYS Performed at [facility], [address], [place], [place] [address]    Report Status 07/10/2023 FINAL  Final  MRSA Next Gen by PCR, Nasal     Status: None   Collection Time: 07/05/23 11:00 AM  Result Value Ref Range Status   MRSA by PCR Next Gen NOT DETECTED NOT DETECTED Final    Comment: (NOTE) The GeneXpert MRSA Assay (FDA approved for NASAL specimens only), is one component of a comprehensive MRSA colonization surveillance program. It is not intended to diagnose MRSA infection nor to guide or monitor treatment for MRSA infections. Test performance is not FDA approved in patients less than 63 years old. Performed at [facility], [address], [place], [place] [address]          Radiology Studies: No results found.      Scheduled Meds:  allopurinol  50 mg Oral Daily   apixaban  2.5 mg Oral BID   carvedilol  25 mg Oral BID   diclofenac Sodium  2 g Topical QID   furosemide  40 mg Intravenous Once   gabapentin  300 mg Oral TID   hydrALAZINE  50 mg Oral Q8H   levothyroxine  50 mcg Oral Daily   losartan  100 mg Oral Daily    predniSONE  5 mg Oral Q breakfast   sodium chloride  flush  3 mL Intravenous Q12H   Continuous Infusions:  sodium chloride       LOS: 5 days    Time spent: 45 minutes spent on chart review, discussion with nursing staff, consultants, updating family and interview/physical exam; more than 50% of that time was spent in counseling and/or coordination of care.    Joseph Art, DO [facility] Available via Epic secure chat 7am-7pm After these hours, please refer to coverage provider listed on [email or web address] 07/10/2023, 11:00 AM

## 2023-07-11 DIAGNOSIS — R531 Weakness: Secondary | ICD-10-CM | POA: Diagnosis not present

## 2023-07-11 MED ORDER — POTASSIUM CHLORIDE CRYS ER 10 MEQ PO TBCR: 10.0000 meq | EXTENDED_RELEASE_TABLET | Freq: Every day | ORAL | Status: DC

## 2023-07-11 MED ORDER — FUROSEMIDE 40 MG PO TABS
40.0000 mg | ORAL_TABLET | Freq: Every day | ORAL | Status: DC
  Administered 2023-07-11: 40 mg via ORAL
  Filled 2023-07-11: qty 1

## 2023-07-11 MED ORDER — ORAL CARE MOUTH RINSE: 15.0000 mL | OROMUCOSAL | Status: DC | PRN

## 2023-07-11 MED ORDER — ACETAMINOPHEN 325 MG PO TABS: 650.0000 mg | ORAL_TABLET | Freq: Four times a day (QID) | ORAL | Status: DC | PRN

## 2023-07-11 MED ORDER — DICLOFENAC SODIUM 1 % EX GEL: 2.0000 g | Freq: Four times a day (QID) | CUTANEOUS | Status: DC

## 2023-07-11 MED ORDER — FUROSEMIDE 40 MG PO TABS: 40.0000 mg | ORAL_TABLET | Freq: Every day | ORAL | Status: DC

## 2023-07-11 NOTE — TOC Transition Note (Signed)
Transition of Care [facility]) - CM/SW Discharge Note   Patient Details  Name: Terry [name] MRN: [id number] Date of Birth: 1941/10/31  Transition of [facility]) CM/SW Contact:  Lindwood Mogel A [place], Theresia Majors Phone Number: 07/11/2023, 1:48 PM   Clinical Narrative:     Patient will DC to: [facility]  Anticipated DC date: 07/11/23  Family notified: Sonnie Alamo  Transport by: Theodoro Grist ID: [id number]  Reference ID: [id number]  Approval Dates: 8/24-8/28  Per MD patient ready for DC to [facility]. RN, patient, patient's family, and facility notified of DC. Discharge Summary and FL2 sent to facility. RN to call report prior to discharge ( [phone], [phone] ). DC packet on chart. Ambulance transport requested for patient.     CSW will sign off for now as social work intervention is no longer needed. Please consult [place] again if new needs arise.    Final next level of care: Skilled Nursing Facility Barriers to Discharge: Barriers Resolved   Patient Goals and CMS Choice      Discharge Placement                Patient chooses bed at: [facility] Patient to be transferred to facility by: [facility] Name of family member notified: Sonnie Alamo Patient and family notified of of transfer: 07/11/23  Discharge Plan and Services Additional resources added to the After Visit Summary for                                       Social Determinants of Health (SDOH) Interventions SDOH Screenings   Food Insecurity: Food Insecurity Present (07/05/2023)  Housing: High Risk (07/05/2023)  Transportation Needs: Unmet Transportation Needs (07/05/2023)  Utilities: Not At Risk (07/05/2023)  Alcohol Screen: Low Risk  (06/09/2022)  Depression (PHQ2-9): Low Risk  (01/14/2023)  Financial Resource Strain: Low Risk  (12/17/2022)  Physical Activity: Insufficiently Active (12/17/2022)  Social Connections: Socially Isolated (12/17/2022)  Stress: No Stress Concern Present (12/17/2022)   Tobacco Use: Low Risk  (07/04/2023)     Readmission Risk Interventions    06/02/2023    3:52 PM  Readmission Risk Prevention Plan  Transportation Screening Complete  PCP or Specialist Appt within 5-7 Days Complete  Home Care Screening Complete  Medication Review (RN CM) Complete

## 2023-07-11 NOTE — Progress Notes (Signed)
Attempted to call report few times but unable to go threw, will attempt again.

## 2023-07-11 NOTE — Progress Notes (Signed)
Physical Therapy Treatment Patient Details Name: Terry [name] MRN: [id number] DOB: [birth year] Today's Date: 07/11/2023   History of Present Illness 82 y.o. male presented by EMS after having left leg pain. He reportedly slid out of his bed 24 hours prior.  He was unable to ambulate due to the pain. Discharge from skilled nursing facility about 5 days before. 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  PMH: prostate cancer, iron deficiency anemia, CKD 3, hypertension, PAF on Eliquis, diastolic dysfunction (HFpEF),  and hypothyroidism.    PT Comments  Pt tolerates treatment well, demonstrating improvement in transfer quality and activity tolerance. Pt remains limited by LE weakness and reports of discomfort in L knee. PT provides education on the need for frequent mobility and ROM of L knee due to OA. Pt will benefit from continued acute PT services in an effort to improve activity tolerance and to reduce falls risk. PT continues to recommend inpatient PT services at the time of discharge.    If plan is discharge home, recommend the following: A lot of help with walking and/or transfers;A lot of help with bathing/dressing/bathroom;Assistance with cooking/housework;Assist for transportation;Help with stairs or ramp for entrance   Can travel by private vehicle     No  Equipment Recommendations  Wheelchair (measurements PT);Hospital bed (defer to post-acute, wheelchair if home today)    Recommendations for Other Services       Precautions / Restrictions Precautions Precautions: Fall Precaution Comments: fall at home prior to admission Restrictions Weight Bearing Restrictions: No     Mobility  Bed Mobility Overal bed mobility: Needs Assistance Bed Mobility: Supine to Sit     Supine to sit: HOB elevated, Used rails, Min assist          Transfers Overall transfer level: Needs assistance Equipment used: Rolling walker (2 wheels) Transfers: Sit  to/from Stand, Bed to chair/wheelchair/BSC Sit to Stand: Min assist, From elevated surface   Step pivot transfers: Contact guard assist       General transfer comment: pt leaning forward onto walker for step -pivot transfers, often bearing weight through R elbow rather than R hand    Ambulation/Gait Ambulation/Gait assistance: Min assist Gait Distance (Feet): 6 Feet Assistive device: Rolling walker (2 wheels) Gait Pattern/deviations: Step-to pattern, Wide base of support, Decreased dorsiflexion - right Gait velocity: reduced Gait velocity interpretation: <1.31 ft/sec, indicative of household ambulator   General Gait Details: pt with R foot drag, difficulty shifting weight to left side to clear R foot   Stairs             Wheelchair Mobility     Tilt Bed    Modified Rankin (Stroke Patients Only)       Balance Overall balance assessment: Needs assistance Sitting-balance support: No upper extremity supported, Feet supported Sitting balance-Leahy Scale: Fair     Standing balance support: Bilateral upper extremity supported Standing balance-Leahy Scale: Poor                              Cognition Arousal: Alert Behavior During Therapy: WFL for tasks assessed/performed Overall Cognitive Status: Within Functional Limits for tasks assessed                                          Exercises General Exercises - Lower Extremity Ankle Circles/Pumps: AROM, Both,  10 reps Long Arc Quad: AROM, Both, 10 reps    General Comments General comments (skin integrity, edema, etc.): VSS on RA, pt with bowel movement during session      Pertinent Vitals/Pain Pain Assessment Pain Assessment: 0-10 Pain Score: 3  Pain Location: L knee Pain Descriptors / Indicators: Sore Pain Intervention(s): Monitored during session    Home Living                          Prior Function            PT Goals (current goals can now be found in  the care plan section) Acute Rehab PT Goals Patient Stated Goal: To eventually return to the park to watch the pond. Progress towards PT goals: Progressing toward goals    Frequency    Min 1X/week      PT Plan      Co-evaluation              AM-PAC PT "6 Clicks" Mobility   Outcome Measure  Help needed turning from your back to your side while in a flat bed without using bedrails?: A Little Help needed moving from lying on your back to sitting on the side of a flat bed without using bedrails?: A Little Help needed moving to and from a bed to a chair (including a wheelchair)?: A Little Help needed standing up from a chair using your arms (e.g., wheelchair or bedside chair)?: A Little Help needed to walk in hospital room?: Total Help needed climbing 3-5 steps with a railing? : Total 6 Click Score: 14    End of Session Equipment Utilized During Treatment: Gait belt Activity Tolerance: Patient tolerated treatment well Patient left: in chair;with call bell/phone within reach;with chair alarm set Nurse Communication: Mobility status PT Visit Diagnosis: Other abnormalities of gait and mobility (R26.89);Muscle weakness (generalized) (M62.81)     Time: 4782-9562 PT Time Calculation (min) (ACUTE ONLY): 47 min  Charges:    $Gait Training: 8-22 mins $Therapeutic Activity: 23-37 mins PT General Charges $$ ACUTE PT VISIT: 1 Visit                    Arlyss Gandy, PT, DPT Acute Rehabilitation Office [phone]    Arlyss Gandy 07/11/2023, 12:36 PM

## 2023-07-11 NOTE — Progress Notes (Signed)
Nurse manager at [facility] given report, family at bedside aware and agrees with transfer to [facility].

## 2023-07-11 NOTE — Discharge Summary (Signed)
Physician Discharge Summary  Terry [name] ION:[id number] DOB: 1941/07/29 DOA: 07/04/2023  PCP: Donita Brooks, MD  Admit date: 07/04/2023 Discharge date: 07/11/2023  Admitted From: home Discharge disposition: SNF   Recommendations for Outpatient Follow-Up:   New med: LASIX-- will need BMP in 1 week and adjustment of dose Palliative care to follow at SNF vs outpatient cancer center for GOC   Discharge Diagnosis:   Principal Problem:   Left lower lobe pneumonia Active Problems:   Prostate cancer metastatic to bone [facility])   Weakness   Palliative care encounter    Discharge Condition: Improved.  Diet recommendation: Low sodium, heart healthy.   Wound care: None.  Code status: Full.   History of Present Illness:   Terry [name] is an 82 y.o. male with a history of metastatic prostate cancer, iron deficiency anemia, CKD 3, HTN, PAF on Eliquis, diastolic CHF, chronic R ureteral stone, and hypothyroidism who presented to the ER via EMS with left leg pain.  He reportedly slid out of his bed 24 hours prior.  He was unable to ambulate due to the pain, and generalized weakness.  Blood pressure was elevated at 190/100 at presentation.  He reported persistent severe deconditioning and an inability to care for himself since the time of his discharge from a skilled nursing facility about 5 days before.    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   Weight as of this encounter: 140.8 kg.       Medical Consultants:   Oncology Palliative care   Discharge Exam:   Vitals:   07/11/23 0458 07/11/23 0700  BP: (!) 158/71 (!) 145/77  Pulse: (!) 58 69  Resp: 18 16  Temp:  98.4 F (36.9 C)  SpO2: 98% 99%   Vitals:  07/10/23 0905 07/10/23 1940 07/11/23 0458 07/11/23 0700  BP: (!) 148/59 (!) 172/71 (!) 158/71 (!) 145/77  Pulse: 66 64 (!) 58 69  Resp: 20 18 18 16   Temp: 98 F (36.7 C) 98.8 F (37.1 C)  98.4 F (36.9 C)  TempSrc: Oral Oral  Oral  SpO2: 96% 98% 98% 99%  Weight:      Height:        General exam: Appears calm and comfortable.    The results of significant diagnostics from this hospitalization (including imaging, microbiology, ancillary  and laboratory) are listed below for reference.     Procedures and Diagnostic Studies:   CT ABDOMEN PELVIS W CONTRAST  Result Date: 07/05/2023 CLINICAL DATA:  82 year old male with history of acute onset of nonlocalized abdominal pain. History of metastatic prostate cancer. * Tracking Code: BO * EXAM: CT ABDOMEN AND PELVIS WITH CONTRAST TECHNIQUE: Multidetector CT imaging of the abdomen and pelvis was performed using the standard protocol following bolus administration of intravenous contrast. RADIATION DOSE REDUCTION: This exam was performed according to the departmental dose-optimization program which includes automated exposure control, adjustment of the mA and/or kV according to patient size and/or use of iterative reconstruction technique. CONTRAST:  75mL OMNIPAQUE IOHEXOL 350 MG/ML SOLN COMPARISON:  CT the abdomen and pelvis 09/30/2021. PET-CT 01/13/2023. FINDINGS: Lower chest: Unremarkable. Hepatobiliary: Subcentimeter low-attenuation lesion in segment 4B of the liver adjacent to the falciform ligament, too small to definitively characterize, but similar to prior studies and statistically likely a cyst (no imaging follow-up recommended). No other suspicious appearing hepatic lesions are noted. No intra or extrahepatic biliary ductal dilatation. Faint densities lie dependently in the gallbladder, likely biliary sludge balls or noncalcified gallstones. Gallbladder is nondistended. No pericholecystic fluid or surrounding inflammatory changes. Pancreas: No pancreatic mass. No pancreatic ductal dilatation. No pancreatic or peripancreatic fluid collections or inflammatory changes. Spleen: Unremarkable. Adrenals/Urinary Tract: Severe atrophy of the right kidney again noted. Right-sided double-J ureteral stent in position with proximal loop reformed in the right renal pelvis and distal loop reformed in the urinary bladder. Severe chronic hydroureteronephrosis is minimally increased compared to prior study,  terminating in the mid ureter where there is a 9 mm calculus adjacent to the stent (axial image 52 of series 3). Several curvilinear calcifications are also noted in the lower pole of the right kidney, some of which may represent calculi, while others may represent parenchymal calcifications. Left kidney and bilateral adrenal glands are normal in appearance. Urinary bladder is otherwise unremarkable in appearance. Stomach/Bowel: The appearance of the stomach is normal. There is no pathologic dilatation of small bowel or colon. Normal appendix. Vascular/Lymphatic: No significant atherosclerotic disease, aneurysm or dissection noted in the abdominal or pelvic vasculature. Multiple prominent borderline enlarged and mildly enlarged retroperitoneal and pelvic lymph nodes are noted. Clear enlargement of a left para-aortic lymph node adjacent to the left renal hilum (axial image 36 of series 3) which currently measures 1.7 cm in short axis (previously nonenlarged), concerning for metastatic disease. Other prominent retrocaval lymph node (axial image 33 of series 3) measuring 1.1 cm in short axis, and left inguinal lymph node (axial image 78 of series 3) measuring 11 mm in short axis also noted. Reproductive: Prostate gland and seminal vesicles are grossly unremarkable in appearance. Other: No significant volume of ascites.  No pneumoperitoneum. Musculoskeletal: Several sclerotic lesions are again noted in the visualized axial and appendicular skeleton, indicative of metastatic disease to the bones in this patient with history of prostate cancer. This appears  progressive, with enlargement of several lesions (best example is in the right side of S1 where there is a 1.9 cm sclerotic lesion which previously measured only 1 cm). The largest lesion is in the left side of the L5 vertebral body (axial image 49 of series 3) currently measuring 4.2 x 3.5 cm. IMPRESSION: 1. Severe chronic right-sided hydroureteronephrosis and severe  right renal atrophy despite indwelling right ureteral stent, likely related to 9 mm calculus in the mid right ureter adjacent to the stent. 2. No other definite acute findings noted in the abdomen or pelvis to account for the patient's symptoms. 3. 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 Increasing lymphadenopathy, most evident in the retroperitoneum also suspicious for metastatic disease in this patient with documented history of prostate cancer. 4. Probable noncalcified gallstones or biliary sludge balls in the gallbladder. No findings to suggest an acute cholecystitis at this time. 5. Additional incidental findings, as above. Electronically Signed   By: Trudie Reed M.D.   On: 07/05/2023 05:24   DG Knee 2 Views Left  Result Date: 07/05/2023 CLINICAL DATA:  Fall, left knee pain EXAM: LEFT KNEE - 1-2 VIEW COMPARISON:  None Available. 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 Mild overall straightening. No acute fracture or dislocation. Moderate left knee effusion. Mild diffuse subcutaneous edema of the visualized left lower extremity. IMPRESSION: 1. Severe tricompartmental degenerative arthritis. 2. Moderate left knee effusion. Electronically Signed   By: Helyn Numbers M.D.   On: 07/05/2023 03:42   CT Head Wo Contrast  Result Date: 07/04/2023 CLINICAL DATA:  Fall, trauma. EXAM: CT HEAD WITHOUT CONTRAST TECHNIQUE: Contiguous axial images were obtained from the base of the skull through the vertex without intravenous contrast. RADIATION DOSE REDUCTION: This exam was performed according to the departmental dose-optimization program which includes automated exposure control, adjustment of the mA and/or kV according to patient size and/or use of iterative reconstruction technique. COMPARISON:  None Available. FINDINGS: Brain: No evidence of acute infarction, hemorrhage, hydrocephalus, extra-axial collection or mass lesion/mass  effect. There is mild diffuse atrophy and mild periventricular white matter hypodensity. Vascular: Atherosclerotic calcifications are present within the cavernous internal carotid arteries. Skull: Normal. Negative for fracture or focal lesion. Sinuses/Orbits: No acute finding. Other: None. IMPRESSION: 1. No acute intracranial abnormality. 2. 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 Electronically Signed   By: Darliss Cheney M.D.   On: 07/04/2023 23:59   DG Chest 2 View  Result Date: 07/04/2023 CLINICAL DATA:  Cough and fall EXAM: CHEST - 2 VIEW COMPARISON:  Radiographs 05/31/2023 FINDINGS: Stable cardiomegaly. Posterior lower lobe consolidation on lateral view compatible with pneumonia. No correlate is seen on AP view. No pleural effusion or pneumothorax. No displaced rib fractures. IMPRESSION: Lower lobe pneumonia seen on lateral view. Electronically Signed   By: Minerva Fester M.D.   On: 07/04/2023 23:51   DG Lumbar Spine Complete  Result Date: 07/04/2023 CLINICAL DATA:  Fall, back pain EXAM: LUMBAR SPINE - COMPLETE 4+ VIEW COMPARISON:  PET-CT 01/13/2023 and lumbar radiographs 09/24/2011 FINDINGS: Right double-J ureteral stent noted. Possible stones along the stent at the level of the iliac crest. Sclerosis in the lower lumbar spine compatible with metastatic lesions. Body habitus reduces diagnostic sensitivity and specificity. Poor cortical definition in some areas due to body habitus. Lower thoracic spondylosis. I do not see a definite fracture or acute subluxation. IMPRESSION: 1. No definite fracture or acute subluxation. Substantially reduced  sensitivity due to body habitus, consider CT if there is a high clinical index of suspicion. 2. Sclerosis in the lower lumbar spine compatible with metastatic lesions. 3. Right double-J ureteral stent with possible stones along the stent at the level of the iliac crest. Electronically Signed    By: Gaylyn Rong M.D.   On: 07/04/2023 16:36   DG Hip Unilat With Pelvis 2-3 Views Left  Result Date: 07/04/2023 CLINICAL DATA:  Fall, injury EXAM: DG HIP (WITH OR WITHOUT PELVIS) 2-3V LEFT COMPARISON:  PET-CT 01/13/2023 FINDINGS: Distal portion of a right double-J ureteral stent is noted with loop formed in the urinary bladder. Cannot exclude stones or concretions adjacent to the tube at the level of the right iliac crest. Sclerotic lesions in the pelvis, most are plea notable in the right ischium, compatible with metastatic lesions. No fracture or acute bony findings identified. IMPRESSION: 1. No acute bony findings. 2. 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 3. Right double-J ureteral stent in place. Cannot exclude stones or concretions adjacent to the tube at the vertical level of the right iliac crest. Electronically Signed   By: Gaylyn Rong M.D.   On: 07/04/2023 16:33     Labs:   Basic Metabolic Panel: Recent Labs  Lab 07/06/23 0239 07/07/23 1051 07/08/23 0427 07/09/23 0412 07/10/23 0722  NA 139 140 139 140 139  K 3.7 2.9* 3.3* 3.8 3.8  CL 104 105 106 105 105  CO2 26 26 28 24 27   GLUCOSE 95 148* 99 99 103*  BUN 14 14 15 16 18   CREATININE 1.50* 1.36* 1.50* 1.37* 1.27*  CALCIUM 7.8* 7.8* 7.5* 8.0* 8.0*  MG 2.1  --   --   --   --    GFR Estimated Creatinine Clearance: 60.9 mL/min (A) (by C-G formula based on SCr of 1.27 mg/dL (H)). Liver Function Tests: Recent Labs  Lab 07/06/23 0239  AST 19  ALT 13  ALKPHOS 52  BILITOT 0.6  PROT 6.1*  ALBUMIN 2.6*   No results for input(s): "LIPASE", "AMYLASE" in the last 168 hours. No results for input(s): "AMMONIA" in the last 168 hours. Coagulation profile No results for input(s): "INR", "PROTIME" in the last 168 hours.  CBC: Recent Labs  Lab 07/05/23 0114 07/06/23 0239 07/07/23 1051 07/08/23 0427 07/09/23 0412  WBC 6.0 6.2 5.5 6.0 6.6  NEUTROABS 4.2  --   --   --   --   HGB 11.2*  10.9* 10.1* 9.5* 10.1*  HCT 37.1* 35.7* 33.2* 31.1* 32.9*  MCV 80.0 78.8* 81.0 81.0 80.8  PLT 168 144* 179 177 194   Cardiac Enzymes: No results for input(s): "CKTOTAL", "CKMB", "CKMBINDEX", "TROPONINI" in the last 168 hours. BNP: Invalid input(s): "POCBNP" CBG: No results for input(s): "GLUCAP" in the last 168 hours. D-Dimer No results for input(s): "DDIMER" in the last 72 hours. Hgb A1c No results for input(s): "HGBA1C" in the last 72 hours. Lipid Profile No results for input(s): "CHOL", "HDL", "LDLCALC", "TRIG", "CHOLHDL", "LDLDIRECT" in the last 72 hours. Thyroid function studies No results for input(s): "TSH", "T4TOTAL", "T3FREE", "THYROIDAB" in the last 72 hours.  Invalid input(s): "FREET3" Anemia work up No results for input(s): "VITAMINB12", "FOLATE", "FERRITIN", "TIBC", "IRON", "RETICCTPCT" in the last 72 hours. Microbiology Recent Results (from the past 240 hour(s))  Resp panel by RT-PCR (RSV, Flu A&B, Covid) Anterior Nasal Swab     Status: None   Collection Time: 07/05/23  1:20 AM   Specimen: Anterior Nasal Swab  Result Value Ref Range Status   SARS Coronavirus 2 by RT PCR NEGATIVE NEGATIVE Final   Influenza A by PCR NEGATIVE NEGATIVE Final   Influenza B by PCR NEGATIVE NEGATIVE Final    Comment: (NOTE) The Xpert Xpress SARS-CoV-2/FLU/RSV plus assay is intended as an aid in the diagnosis of influenza from Nasopharyngeal swab specimens and should not be used as a sole basis for treatment. Nasal washings and aspirates are unacceptable for Xpert Xpress SARS-CoV-2/FLU/RSV testing.  Fact Sheet for Patients: BloggerCourse.com  Fact Sheet for Healthcare Providers: SeriousBroker.it  This test is not yet approved or cleared by the [place] FDA and has been authorized for detection and/or diagnosis of SARS-CoV-2 by FDA under an Emergency Use Authorization (EUA). This EUA will remain in effect (meaning this test can  be used) for the duration of the COVID-19 declaration under Section 564(b)(1) of the Act, 21 U.S.C. section 360bbb-3(b)(1), unless the authorization is terminated or revoked.     Resp Syncytial Virus by PCR NEGATIVE NEGATIVE Final    Comment: (NOTE) Fact Sheet for Patients: BloggerCourse.com  Fact Sheet for Healthcare Providers: SeriousBroker.it  This test is not yet approved or cleared by the [place] FDA and has been authorized for detection and/or diagnosis of SARS-CoV-2 by FDA under an Emergency Use Authorization (EUA). This EUA will remain in effect (meaning this test can be used) for the duration of the COVID-19 declaration under Section 564(b)(1) of the Act, 21 U.S.C. section 360bbb-3(b)(1), unless the authorization is terminated or revoked.  Performed at [facility], [address] [place], [place] [address]   Blood culture (routine x 2)     Status: None   Collection Time: 07/05/23  1:36 AM   Specimen: BLOOD LEFT HAND  Result Value Ref Range Status   Specimen Description BLOOD LEFT HAND  Final   Special Requests   Final    BOTTLES DRAWN AEROBIC AND ANAEROBIC Blood Culture adequate volume   Culture   Final    NO GROWTH 5 DAYS Performed at [facility], [address] [place], [place] [address]    Report Status 07/10/2023 FINAL  Final  Blood culture (routine x 2)     Status: None   Collection Time: 07/05/23  1:44 AM   Specimen: BLOOD  Result Value Ref Range Status   Specimen Description BLOOD SITE NOT SPECIFIED  Final   Special Requests   Final    BOTTLES DRAWN AEROBIC AND ANAEROBIC Blood Culture results may not be optimal due to an excessive volume of blood received in culture bottles   Culture   Final    NO GROWTH 5 DAYS Performed at [facility], [address] [place], [place] [address]    Report Status 07/10/2023 FINAL  Final  MRSA Next Gen by PCR, Nasal     Status: None    Collection Time: 07/05/23 11:00 AM  Result Value Ref Range Status   MRSA by PCR Next Gen NOT DETECTED NOT DETECTED Final    Comment: (NOTE) The GeneXpert MRSA Assay (FDA approved for NASAL specimens only), is one component of a comprehensive MRSA colonization surveillance program. It is not intended to diagnose MRSA infection nor to guide or monitor treatment for MRSA infections. Test performance is not FDA approved in patients less than 22 years old. Performed at [facility], [address], [place], [place] [address]      Discharge Instructions:   Discharge Instructions  Diet - low sodium heart healthy   Complete by: As directed    Increase activity slowly   Complete by: As directed       Allergies as of 07/11/2023       Reactions   Lisinopril    FACIAL SWELLING        Medication List     STOP taking these medications    traMADol 50 MG tablet Commonly known as: ULTRAM       TAKE these medications    acetaminophen 325 MG tablet Commonly known as: TYLENOL Take 2 tablets (650 mg total) by mouth every 6 (six) hours as needed for mild pain (or Fever >/= 101).   allopurinol 100 MG tablet Commonly known as: ZYLOPRIM Take 0.5 tablets (50 mg total) by mouth daily.   apixaban 2.5 MG Tabs tablet Commonly known as: ELIQUIS Take 1 tablet (2.5 mg total) by mouth 2 (two) times daily.   carvedilol 25 MG tablet Commonly known as: COREG Take 25 mg by mouth 2 (two) times daily.   diclofenac Sodium 1 % Gel Commonly known as: VOLTAREN Apply 2 g topically 4 (four) times daily.   furosemide 40 MG tablet Commonly known as: LASIX Take 1 tablet (40 mg total) by mouth daily. Start taking on: July 12, 2023   gabapentin 300 MG capsule Commonly known as: NEURONTIN Take 1 capsule (300 mg total) by mouth 3 times/day as needed-between meals & bedtime. What changed: when to take this   hydrALAZINE 50 MG tablet Commonly known as: APRESOLINE Take 1 tablet (50  mg total) by mouth every 8 (eight) hours.   Iron (Ferrous Sulfate) 325 (65 Fe) MG Tabs Take 325 mg by mouth daily.   levothyroxine 50 MCG tablet Commonly known as: SYNTHROID Take 1 tablet (50 mcg total) by mouth daily.   losartan 100 MG tablet Commonly known as: COZAAR Take 1 tablet (100 mg total) by mouth daily.   potassium chloride 10 MEQ tablet Commonly known as: KLOR-CON M Take 1 tablet (10 mEq total) by mouth daily.   predniSONE 5 MG tablet Commonly known as: DELTASONE Take 1 tablet (5 mg total) by mouth daily with breakfast.   VITAMIN D PO Take 1 tablet by mouth daily.   VITAMIN E PO Take 1 tablet by mouth daily.          Time coordinating discharge: 45 min  Signed:  Joseph Art DO  [facility] 07/11/2023, 10:40 AM

## 2023-07-11 NOTE — TOC Progression Note (Signed)
Transition of Care [facility]) - Progression Note    Patient Details  Name: Terry [name] MRN: [id number] Date of Birth: 10/21/1941  Transition of Care [facility]) CM/SW Contact  Ileah Falkenstein A [place], [place] Phone Number: 07/11/2023, 10:39 AM  Clinical Narrative:     CSW contacted [employer], pt's auth was approved.  Dates 8/24-8/28.   Auth ID: [id number], Reference ID: [id number]  Facility and provider notified. Plan for DC today  Expected Discharge Plan: Skilled Nursing Facility Barriers to Discharge: SNF Pending bed offer, Continued Medical Work up, Family Issues, English as a second language teacher  Expected Discharge Plan and Services       Living arrangements for the past 2 months: Single Family Home Expected Discharge Date: 07/11/23                                     Social Determinants of Health (SDOH) Interventions SDOH Screenings   Food Insecurity: Food Insecurity Present (07/05/2023)  Housing: High Risk (07/05/2023)  Transportation Needs: Unmet Transportation Needs (07/05/2023)  Utilities: Not At Risk (07/05/2023)  Alcohol Screen: Low Risk  (06/09/2022)  Depression (PHQ2-9): Low Risk  (01/14/2023)  Financial Resource Strain: Low Risk  (12/17/2022)  Physical Activity: Insufficiently Active (12/17/2022)  Social Connections: Socially Isolated (12/17/2022)  Stress: No Stress Concern Present (12/17/2022)  Tobacco Use: Low Risk  (07/04/2023)    Readmission Risk Interventions    06/02/2023    3:52 PM  Readmission Risk Prevention Plan  Transportation Screening Complete  PCP or Specialist Appt within 5-7 Days Complete  Home Care Screening Complete  Medication Review (RN CM) Complete

## 2023-07-20 NOTE — Progress Notes (Deleted)
Palliative Medicine [facility]  [phone] [phone]   Name: Terry [name] Date: 07/20/2023 MRN: [id number]  DOB: February 10, 1941  Patient Care Team: Donita Brooks, MD as PCP - General (Family Medicine) Mariah Milling Tollie Pizza, MD as Consulting Physician (Cardiology) Erroll Luna, [facility] (Inactive) as Pharmacist (Pharmacist) Malachy Mood, MD as Consulting Physician (Oncology)    REASON FOR CONSULTATION: Terry [name] is a 82 y.o. 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 Palliative ask to see for symptom management and goals of care.    SOCIAL HISTORY:     reports that he has never smoked. He has never been exposed to tobacco smoke. He has never used smokeless tobacco. He reports that he does not currently use alcohol. He reports that he does not currently use drugs after having used the following drugs: Marijuana.  ADVANCE DIRECTIVES:  None on file  CODE STATUS: Full code  PAST MEDICAL HISTORY: Past Medical History:  Diagnosis Date   Arthritis    Cancer (HCC)    Phreesia 08/04/2020   CHF, chronic (HCC)    diastolic   CKD (chronic kidney disease) stage 3, GFR 30-59 ml/min (HCC)    Diabetes mellitus without complication (HCC)    Dysrhythmia    Hypertension    Hypothyroidism    Lymphedema    Paroxysmal atrial fibrillation (HCC)    Prostate cancer (HCC)    Renal insufficiency     PAST SURGICAL HISTORY:  Past Surgical History:  Procedure Laterality Date   COLON SURGERY N/A    Phreesia 08/04/2020    HEMATOLOGY/ONCOLOGY HISTORY:  Oncology History Overview Note   Cancer Staging  Prostate cancer [facility]) Staging form: Prostate, AJCC 8th Edition - Clinical stage from 09/27/2021: Stage IVB (cTX, cNX, cM1b) - Signed by Creig Hines, MD on 09/27/2021     Prostate cancer (HCC)   08/01/2020 Initial Diagnosis   Prostate cancer (HCC)   09/27/2021 Cancer Staging   Staging form: Prostate, AJCC 8th Edition - Clinical stage from 09/27/2021: Stage IVB (cTX, cNX, cM1b) - Signed by Creig Hines, MD on 09/27/2021   01/13/2023 Imaging    IMPRESSION: 1. Widespread bony metastatic disease involving axial and appendicular skeleton with marked increased radiotracer accumulation. Some areas with sclerosis and other areas with lucency or without CT correlate as discussed. Metastatic disease is much more widespread based on comparison with prior imaging which was not performed with PSMA PET. 2. Enlarging AP window lymph node with moderate radiotracer accumulation, suspicious for metastatic disease. Other lymph node in the RIGHT mediastinum without change with mild radiotracer accumulation. 3. Marked radiotracer accumulation in the prostate. 4. Small lymph nodes scattered throughout the pelvis with radiotracer accumulation that is mild-to-moderate, nonspecific but suspicious given other findings. Attention on follow-up. This includes bilateral groin lymph nodes largest on the RIGHT. 5. 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 6. Signs of prior colonic resection in the LEFT hemiabdomen.     ALLERGIES:  is allergic to lisinopril.  MEDICATIONS:  Current Outpatient Medications  Medication Sig Dispense Refill   acetaminophen (TYLENOL) 325 MG tablet Take 2 tablets (650 mg total) by mouth every 6 (six) hours as needed for mild pain (or Fever >/= 101).     allopurinol (ZYLOPRIM) 100 MG tablet Take 0.5 tablets (50 mg total) by mouth daily. 30 tablet  6   apixaban (ELIQUIS) 2.5 MG TABS tablet Take 1 tablet (2.5 mg total) by mouth 2 (two) times daily. 60 tablet 11   carvedilol (COREG) 25 MG tablet Take 25 mg by mouth 2 (two) times daily.     diclofenac Sodium (VOLTAREN) 1 % GEL Apply 2 g topically 4 (four) times daily.     furosemide (LASIX) 40 MG  tablet Take 1 tablet (40 mg total) by mouth daily.     gabapentin (NEURONTIN) 300 MG capsule Take 1 capsule (300 mg total) by mouth 3 times/day as needed-between meals & bedtime. (Patient taking differently: Take 300 mg by mouth 3 (three) times daily.) 30 capsule 3   hydrALAZINE (APRESOLINE) 50 MG tablet Take 1 tablet (50 mg total) by mouth every 8 (eight) hours. 90 tablet 0   Iron, Ferrous Sulfate, 325 (65 Fe) MG TABS Take 325 mg by mouth daily. 30 tablet 3   levothyroxine (SYNTHROID) 50 MCG tablet Take 1 tablet (50 mcg total) by mouth daily. 90 tablet 3   losartan (COZAAR) 100 MG tablet Take 1 tablet (100 mg total) by mouth daily. 30 tablet 0   potassium chloride (KLOR-CON M) 10 MEQ tablet Take 1 tablet (10 mEq total) by mouth daily.     predniSONE (DELTASONE) 5 MG tablet Take 1 tablet (5 mg total) by mouth daily with breakfast. 90 tablet 1   VITAMIN D PO Take 1 tablet by mouth daily.     VITAMIN E PO Take 1 tablet by mouth daily.     No current facility-administered medications for this visit.   Facility-Administered Medications Ordered in Other Visits  Medication Dose Route Frequency Provider Last Rate Last Admin   leuprolide (6 Month) (ELIGARD) injection 45 mg  45 mg Subcutaneous Q6 months Creig Hines, MD   45 mg at 12/28/22 1549    VITAL SIGNS: There were no vitals taken for this visit. There were no vitals filed for this visit.  Estimated body mass index is 48.62 kg/m as calculated from the following:   Height as of 07/04/23: 5\' 7"  (1.702 m).   Weight as of 07/06/23: 310 lb 6.5 oz (140.8 kg).  LABS: CBC:    Component Value Date/Time   WBC 6.6 07/09/2023 0412   HGB 10.1 (L) 07/09/2023 0412   HGB 10.5 (L) 06/29/2023 1206   HCT 32.9 (L) 07/09/2023 0412   PLT 194 07/09/2023 0412   PLT 174 06/29/2023 1206   MCV 80.8 07/09/2023 0412   NEUTROABS 4.2 07/05/2023 0114   LYMPHSABS 0.9 07/05/2023 0114   MONOABS 0.7 07/05/2023 0114   EOSABS 0.1 07/05/2023 0114   BASOSABS 0.0  07/05/2023 0114   Comprehensive Metabolic Panel:    Component Value Date/Time   NA 139 07/10/2023 0722   K 3.8 07/10/2023 0722   CL 105 07/10/2023 0722   CO2 27 07/10/2023 0722   BUN 18 07/10/2023 0722   CREATININE 1.27 (H) 07/10/2023 0722   CREATININE 1.55 (H) 07/01/2023 1242   GLUCOSE 103 (H) 07/10/2023 0722   CALCIUM 8.0 (L) 07/10/2023 0722   AST 19 07/06/2023 0239   AST 13 (L) 06/29/2023 1206   ALT 13 07/06/2023 0239   ALT 9 06/29/2023 1206   ALKPHOS 52 07/06/2023 0239   BILITOT 0.6 07/06/2023 0239   BILITOT 0.4 06/29/2023 1206   PROT 6.1 (L) 07/06/2023 0239   ALBUMIN 2.6 (L) 07/06/2023 0239    RADIOGRAPHIC STUDIES: CT ABDOMEN PELVIS W CONTRAST  Result Date: 07/05/2023 CLINICAL DATA:  82 year old  male with history of acute onset of nonlocalized abdominal pain. History of metastatic prostate cancer. * Tracking Code: BO * EXAM: CT ABDOMEN AND PELVIS WITH CONTRAST TECHNIQUE: Multidetector CT imaging of the abdomen and pelvis was performed using the standard protocol following bolus administration of intravenous contrast. RADIATION DOSE REDUCTION: This exam was performed according to the departmental dose-optimization program which includes automated exposure control, adjustment of the mA and/or kV according to patient size and/or use of iterative reconstruction technique. CONTRAST:  75mL OMNIPAQUE IOHEXOL 350 MG/ML SOLN COMPARISON:  CT the abdomen and pelvis 09/30/2021. PET-CT 01/13/2023. FINDINGS: Lower chest: Unremarkable. Hepatobiliary: Subcentimeter low-attenuation lesion in segment 4B of the liver adjacent to the falciform ligament, too small to definitively characterize, but similar to prior studies and statistically likely a cyst (no imaging follow-up recommended). No other suspicious appearing hepatic lesions are noted. No intra or extrahepatic biliary ductal dilatation. Faint densities lie dependently in the gallbladder, likely biliary sludge balls or noncalcified gallstones.  Gallbladder is nondistended. No pericholecystic fluid or surrounding inflammatory changes. Pancreas: No pancreatic mass. No pancreatic ductal dilatation. No pancreatic or peripancreatic fluid collections or inflammatory changes. Spleen: Unremarkable. Adrenals/Urinary Tract: Severe atrophy of the right kidney again noted. Right-sided double-J ureteral stent in position with proximal loop reformed in the right renal pelvis and distal loop reformed in the urinary bladder. Severe chronic hydroureteronephrosis is minimally increased compared to prior study, terminating in the mid ureter where there is a 9 mm calculus adjacent to the stent (axial image 52 of series 3). Several curvilinear calcifications are also noted in the lower pole of the right kidney, some of which may represent calculi, while others may represent parenchymal calcifications. Left kidney and bilateral adrenal glands are normal in appearance. Urinary bladder is otherwise unremarkable in appearance. Stomach/Bowel: The appearance of the stomach is normal. There is no pathologic dilatation of small bowel or colon. Normal appendix. Vascular/Lymphatic: No significant atherosclerotic disease, aneurysm or dissection noted in the abdominal or pelvic vasculature. Multiple prominent borderline enlarged and mildly enlarged retroperitoneal and pelvic lymph nodes are noted. Clear enlargement of a left para-aortic lymph node adjacent to the left renal hilum (axial image 36 of series 3) which currently measures 1.7 cm in short axis (previously nonenlarged), concerning for metastatic disease. Other prominent retrocaval lymph node (axial image 33 of series 3) measuring 1.1 cm in short axis, and left inguinal lymph node (axial image 78 of series 3) measuring 11 mm in short axis also noted. Reproductive: Prostate gland and seminal vesicles are grossly unremarkable in appearance. Other: No significant volume of ascites.  No pneumoperitoneum. Musculoskeletal: Several  sclerotic lesions are again noted in the visualized axial and appendicular skeleton, indicative of metastatic disease to the bones in this patient with history of prostate cancer. This appears progressive, with enlargement of several lesions (best example is in the right side of S1 where there is a 1.9 cm sclerotic lesion which previously measured only 1 cm). The largest lesion is in the left side of the L5 vertebral body (axial image 49 of series 3) currently measuring 4.2 x 3.5 cm. IMPRESSION: 1. 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 2. No other definite acute findings noted in the abdomen or pelvis to account for the patient's symptoms. 3. 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 Increasing lymphadenopathy, most evident in the retroperitoneum also  suspicious for metastatic disease in this patient with documented history of prostate cancer. 4. Probable noncalcified gallstones or biliary sludge balls in the gallbladder. No findings to suggest an acute cholecystitis at this time. 5. Additional incidental findings, as above. Electronically Signed   By: Trudie Reed M.D.   On: 07/05/2023 05:24   CT Head Wo Contrast  Result Date: 07/04/2023 CLINICAL DATA:  Fall, trauma. EXAM: CT HEAD WITHOUT CONTRAST TECHNIQUE: Contiguous axial images were obtained from the base of the skull through the vertex without intravenous contrast. RADIATION DOSE REDUCTION: This exam was performed according to the departmental dose-optimization program which includes automated exposure control, adjustment of the mA and/or kV according to patient size and/or use of iterative reconstruction technique. COMPARISON:  None Available. FINDINGS: Brain: No evidence of acute infarction, hemorrhage, hydrocephalus, extra-axial collection or mass  lesion/mass effect. There is mild diffuse atrophy and mild periventricular white matter hypodensity. Vascular: Atherosclerotic calcifications are present within the cavernous internal carotid arteries. Skull: Normal. Negative for fracture or focal lesion. Sinuses/Orbits: No acute finding. Other: None. IMPRESSION: 1. No acute intracranial abnormality. 2. 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 Electronically Signed   By: Darliss Cheney M.D.   On: 07/04/2023 23:59    PERFORMANCE STATUS (ECOG) : {CHL ONC ECOG WU:[id number]}  Review of Systems Unless otherwise noted, a complete review of systems is negative.  Physical Exam General: NAD Cardiovascular: regular rate and rhythm Pulmonary: clear ant fields Abdomen: soft, nontender, + bowel sounds Extremities: no edema, no joint deformities Skin: no rashes Neurological:  IMPRESSION: *** I introduced myself, Early Steel RN, and Palliative's role in collaboration with the oncology team. Concept of Palliative Care was introduced as specialized medical care for people and their families living with serious illness.  It focuses on providing relief from the symptoms and stress of a serious illness.  The goal is to improve quality of life for both the patient and the family. Values and goals of care important to patient and family were attempted to be elicited.    We discussed *** current illness and what it means in the larger context of *** on-going co-morbidities. Natural disease trajectory and expectations were discussed.  I discussed the importance of continued conversation with family and their medical providers regarding overall plan of care and treatment options, ensuring decisions are within the context of the patients values and GOCs.  PLAN: Established therapeutic relationship. Education provided on palliative's role in collaboration with their Oncology/Radiation team. I will plan  to see patient back in 2-4 weeks in collaboration to other oncology appointments.    Patient expressed understanding and was in agreement with this plan. He also understands that He can call the clinic at any time with any questions, concerns, or complaints.   Thank you for your referral and allowing Palliative to assist in [name] care.   Number and complexity of problems addressed: ***HIGH - 1 or more chronic illnesses with SEVERE exacerbation, progression, or side effects of treatment - advanced cancer, pain. Any controlled substances utilized were prescribed in the context of palliative care.   Visit consisted of counseling and education dealing with the complex and emotionally intense issues of symptom management and palliative care in the setting of serious and potentially life-threatening illness.Greater than 50%  of this time was spent counseling and coordinating care related to the above assessment and plan.  Signed by: Willette Alma, AGPCNP-BC Palliative Medicine Team/[facility]   *Please  note that this is a verbal dictation therefore any spelling or grammatical errors are due to the "[employer]agon [employer] One" system interpretation.

## 2023-07-27 ENCOUNTER — Inpatient Hospital Stay (HOSPITAL_BASED_OUTPATIENT_CLINIC_OR_DEPARTMENT_OTHER): Payer: Medicare Other | Admitting: Hematology

## 2023-07-27 ENCOUNTER — Inpatient Hospital Stay: Payer: Medicare Other | Admitting: Nurse Practitioner

## 2023-07-27 ENCOUNTER — Inpatient Hospital Stay: Payer: Medicare Other | Attending: Hematology

## 2023-07-27 ENCOUNTER — Other Ambulatory Visit: Payer: Self-pay

## 2023-07-27 ENCOUNTER — Encounter: Payer: Self-pay | Admitting: Hematology

## 2023-07-27 VITALS — BP 177/71 | HR 57 | Temp 98.1°F | Resp 18 | Ht 67.0 in | Wt 297.8 lb

## 2023-07-27 DIAGNOSIS — C61 Malignant neoplasm of prostate: Secondary | ICD-10-CM | POA: Insufficient documentation

## 2023-07-27 DIAGNOSIS — C7951 Secondary malignant neoplasm of bone: Secondary | ICD-10-CM | POA: Insufficient documentation

## 2023-07-27 DIAGNOSIS — Z7952 Long term (current) use of systemic steroids: Secondary | ICD-10-CM | POA: Diagnosis not present

## 2023-07-27 LAB — CBC WITH DIFFERENTIAL (CANCER CENTER ONLY)
Abs Immature Granulocytes: 0.02 10*3/uL (ref 0.00–0.07)
Basophils Absolute: 0 10*3/uL (ref 0.0–0.1)
Basophils Relative: 0 %
Eosinophils Absolute: 0.1 10*3/uL (ref 0.0–0.5)
Eosinophils Relative: 1 %
HCT: 38.7 % — ABNORMAL LOW (ref 39.0–52.0)
Hemoglobin: 11.9 g/dL — ABNORMAL LOW (ref 13.0–17.0)
Immature Granulocytes: 0 %
Lymphocytes Relative: 8 %
Lymphs Abs: 0.6 10*3/uL — ABNORMAL LOW (ref 0.7–4.0)
MCH: 24.7 pg — ABNORMAL LOW (ref 26.0–34.0)
MCHC: 30.7 g/dL (ref 30.0–36.0)
MCV: 80.3 fL (ref 80.0–100.0)
Monocytes Absolute: 0.5 10*3/uL (ref 0.1–1.0)
Monocytes Relative: 7 %
Neutro Abs: 6.4 10*3/uL (ref 1.7–7.7)
Neutrophils Relative %: 84 %
Platelet Count: 202 10*3/uL (ref 150–400)
RBC: 4.82 MIL/uL (ref 4.22–5.81)
RDW: 17.6 % — ABNORMAL HIGH (ref 11.5–15.5)
WBC Count: 7.7 10*3/uL (ref 4.0–10.5)
nRBC: 0 % (ref 0.0–0.2)

## 2023-07-27 LAB — CMP (CANCER CENTER ONLY)
ALT: 6 U/L (ref 0–44)
AST: 11 U/L — ABNORMAL LOW (ref 15–41)
Albumin: 3.9 g/dL (ref 3.5–5.0)
Alkaline Phosphatase: 64 U/L (ref 38–126)
Anion gap: 7 (ref 5–15)
BUN: 23 mg/dL (ref 8–23)
CO2: 29 mmol/L (ref 22–32)
Calcium: 9.7 mg/dL (ref 8.9–10.3)
Chloride: 105 mmol/L (ref 98–111)
Creatinine: 1.37 mg/dL — ABNORMAL HIGH (ref 0.61–1.24)
GFR, Estimated: 52 mL/min — ABNORMAL LOW (ref >60)
Glucose, Bld: 88 mg/dL (ref 70–99)
Potassium: 3.9 mmol/L (ref 3.5–5.1)
Sodium: 141 mmol/L (ref 135–145)
Total Bilirubin: 0.3 mg/dL (ref 0.3–1.2)
Total Protein: 8.1 g/dL (ref 6.5–8.1)

## 2023-07-27 NOTE — Progress Notes (Signed)
[facility]   Telephone:(336) [phone] Fax:(336) [phone]   Clinic Follow up Note   Patient Care Team: Donita Brooks, MD as PCP - General (Family Medicine) Antonieta Iba, MD as Consulting Physician (Cardiology) Erroll Luna, [facility] (Inactive) as Pharmacist (Pharmacist) Malachy Mood, MD as Consulting Physician (Oncology)  Date of Service:  07/27/2023  CHIEF COMPLAINT: f/u of metastatic prostate cancer    CURRENT THERAPY:  Eligard every 6 months Zytiga 1000 mg daily and prednisone 5 mg daily, started in March 2024, held in 06/2023 due to severe fatigue  Xgeva every 2 months, held for now due to dental Terry [name]     ASSESSMENT:  Terry [name] is a 82 y.o. 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 He has been treated with ADT, but lost follow-up intermittently due to compliant and transportation issue. -last PSA was 74 on 12/21/2022, which has increased from 32.92 years ago -PSMA PET scan on 01/13/2023 showed widespread bone metastasis, and suspicious lymph node metastasis, in addition to hypermetabolism in prostate. -I recommend continue ADT -He started Zytiga 1000mg  daily/prednisone 5mg  daily in March 2024.   -He unfortunately had a multiple falls at home, and was hospitalized in July and again in August 2024, still in rehab now, Terry [name] has been held since July  -If he is able to recover well, we may try low-dose Zytiga 500 mg daily.  He agrees with the plan. -He received 1 dose Xgeva in August 2024, but still has not dental clearance.  Will hold it for now.   PLAN: -lab reviewed -Continue Eligard injection, I don't plan on restarting Zytiga due to pt overall  weakness. -If pt energy improves, may restart Zytiga on low dose. -lab and f/u in 2 months  SUMMARY OF ONCOLOGIC  HISTORY: Oncology History Overview Note   Cancer Staging  Prostate cancer [facility]) Staging form: Prostate, AJCC 8th Edition - Clinical stage from 09/27/2021: Stage IVB (cTX, cNX, cM1b) - Signed by Creig Hines, MD on 09/27/2021     Prostate cancer (HCC)  08/01/2020 Initial Diagnosis   Prostate cancer (HCC)   09/27/2021 Cancer Staging   Staging form: Prostate, AJCC 8th Edition - Clinical stage from 09/27/2021: Stage IVB (cTX, cNX, cM1b) - Signed by Creig Hines, MD on 09/27/2021   01/13/2023 Imaging    IMPRESSION: 1. Widespread bony metastatic disease involving axial and appendicular skeleton with marked increased radiotracer accumulation. Some areas with sclerosis and other areas with lucency or without CT correlate as discussed. Metastatic disease is much more widespread based on comparison with prior imaging which was not performed with PSMA PET. 2. Enlarging AP window lymph node with moderate radiotracer accumulation, suspicious for metastatic disease. Other lymph node in the RIGHT mediastinum without change with mild radiotracer accumulation. 3. Marked radiotracer accumulation in the prostate. 4. Small lymph nodes scattered throughout the pelvis with radiotracer accumulation that is mild-to-moderate, nonspecific but suspicious given other findings. Attention on follow-up. This includes bilateral groin lymph nodes largest on the RIGHT. 5. 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 6. Signs of prior colonic resection in the LEFT hemiabdomen.      INTERVAL HISTORY:  Terry [name] is here for a follow up of metastatic prostate cancer. He was last seen by me on 06/29/2023. He presents to the clinic accompanied by daughter.  Pt went to rehab after being hospitalized. Pt reports that he goes to PT once a day. Pt states he doesn't feel comfortable walking with a walker, but his lower legs be hurting. He reports that his energy is better since being off  Zytiga. Pt reports of having diarrhea.    All other systems were reviewed with the patient and are negative.  MEDICAL HISTORY:  Past Medical History:  Diagnosis Date   Arthritis    Cancer (HCC)    Phreesia 08/04/2020   CHF, chronic (HCC)    diastolic   CKD (chronic kidney disease) stage 3, GFR 30-59 ml/min (HCC)    Diabetes mellitus without complication (HCC)    Dysrhythmia    Hypertension    Hypothyroidism    Lymphedema    Paroxysmal atrial fibrillation (HCC)    Prostate cancer (HCC)    Renal insufficiency     SURGICAL HISTORY: Past Surgical History:  Procedure Laterality Date   COLON SURGERY N/A    Phreesia 08/04/2020    I have reviewed the social history and family history with the patient and they are unchanged from previous note.  ALLERGIES:  is allergic to lisinopril.  MEDICATIONS:  Current Outpatient Medications  Medication Sig Dispense Refill   acetaminophen (TYLENOL) 325 MG tablet Take 2 tablets (650 mg total) by mouth every 6 (six) hours as needed for mild pain (or Fever >/= 101).     allopurinol (ZYLOPRIM) 100 MG tablet Take 0.5 tablets (50 mg total) by mouth daily. 30 tablet 6   apixaban (ELIQUIS) 2.5 MG TABS tablet Take 1 tablet (2.5 mg total) by mouth 2 (two) times daily. 60 tablet 11   carvedilol (COREG) 25 MG tablet Take 25 mg by mouth 2 (two) times daily.     diclofenac Sodium (VOLTAREN) 1 % GEL Apply 2 g topically 4 (four) times daily.     furosemide (LASIX) 40 MG tablet Take 1 tablet (40 mg total) by mouth daily.     gabapentin (NEURONTIN) 300 MG capsule Take 1 capsule (300 mg total) by mouth 3 times/day as needed-between meals & bedtime. (Patient taking differently: Take 300 mg by mouth 3 (three) times daily.) 30 capsule 3   hydrALAZINE (APRESOLINE) 50 MG tablet Take 1 tablet (50 mg total) by mouth every 8 (eight) hours. 90 tablet 0   Iron, Ferrous Sulfate, 325 (65 Fe) MG TABS Take 325 mg by mouth daily. 30 tablet 3   levothyroxine (SYNTHROID) 50 MCG  tablet Take 1 tablet (50 mcg total) by mouth daily. 90 tablet 3   losartan (COZAAR) 100 MG tablet Take 1 tablet (100 mg total) by mouth daily. 30 tablet 0   potassium chloride (KLOR-CON M) 10 MEQ tablet Take 1 tablet (10 mEq total) by mouth daily.     predniSONE (DELTASONE) 5 MG tablet Take 1 tablet (5 mg total) by mouth daily with breakfast. 90 tablet 1   VITAMIN D PO Take 1 tablet by mouth daily.     VITAMIN E PO Take 1 tablet by mouth daily.     No current facility-administered medications for this visit.   Facility-Administered Medications Ordered in Other Visits  Medication Dose Route Frequency Provider Last Rate Last Admin   leuprolide (6 Month) (ELIGARD) injection 45 mg  45 mg Subcutaneous Q6 months Creig Hines, MD   45 mg at 12/28/22 1549    PHYSICAL EXAMINATION: ECOG PERFORMANCE STATUS: 3 - Symptomatic, >50% confined to bed  Vitals:   07/27/23 1435  BP: Marland Kitchen)  177/71  Pulse: (!) 57  Resp: 18  Temp: 98.1 F (36.7 C)  SpO2: 99%   Wt Readings from Last 3 Encounters:  07/27/23 297 lb 12.8 oz (135.1 kg)  07/06/23 (!) 310 lb 6.5 oz (140.8 kg)  07/01/23 (!) 305 lb (138.3 kg)     GENERAL:alert, no distress and comfortable SKIN: skin color normal, no rashes or significant lesions EYES: normal, Conjunctiva are pink and non-injected, sclera clear  NEURO: alert & oriented x 3 with fluent speech ABORATORY DATA:  I have reviewed the data as listed    Latest Ref Rng & Units 07/27/2023    1:32 PM 07/09/2023    4:12 AM 07/08/2023    4:27 AM  CBC  WBC 4.0 - 10.5 K/uL 7.7  6.6  6.0   Hemoglobin 13.0 - 17.0 g/dL 69.6  29.5  9.5   Hematocrit 39.0 - 52.0 % 38.7  32.9  31.1   Platelets 150 - 400 K/uL 202  194  177         Latest Ref Rng & Units 07/27/2023    1:32 PM 07/10/2023    7:22 AM 07/09/2023    4:12 AM  CMP  Glucose 70 - 99 mg/dL 88  284  99   BUN 8 - 23 mg/dL 23  18  16    Creatinine 0.61 - 1.24 mg/dL 1.32  4.40  1.02   Sodium 135 - 145 mmol/L 141  139  140   Potassium  3.5 - 5.1 mmol/L 3.9  3.8  3.8   Chloride 98 - 111 mmol/L 105  105  105   CO2 22 - 32 mmol/L 29  27  24    Calcium 8.9 - 10.3 mg/dL 9.7  8.0  8.0   Total Protein 6.5 - 8.1 g/dL 8.1     Total Bilirubin 0.3 - 1.2 mg/dL 0.3     Alkaline Phos 38 - 126 U/L 64     AST 15 - 41 U/L 11     ALT 0 - 44 U/L 6         RADIOGRAPHIC STUDIES: I have personally reviewed the radiological images as listed and agreed with the findings in the report. No results found.    No orders of the defined types were placed in this encounter.  All questions were answered. The patient knows to call the clinic with any problems, questions or concerns. No barriers to learning was detected. The total time spent in the appointment was 25 minutes.     Malachy Mood, MD 07/27/2023   Carolin Coy, CMA, am acting as scribe for Malachy Mood, MD.   I have reviewed the above documentation for accuracy and completeness, and I agree with the above.

## 2023-07-27 NOTE — Assessment & Plan Note (Addendum)
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 He has been treated with ADT, but lost follow-up intermittently due to compliant and transportation issue. -last PSA was 74 on 12/21/2022, which has increased from 32.92 years ago -PSMA PET scan on 01/13/2023 showed widespread bone metastasis, and suspicious lymph node metastasis, in addition to hypermetabolism in prostate. -I recommend continue ADT -He started Zytiga 1000mg  daily/prednisone 5mg  daily in March 2024.   -He unfortunately had a multiple falls at home, and was hospitalized in July and again in August 2024, still in rehab now, [name] has been held since July  -If he is able to recover well, we may try low-dose Zytiga 500 mg daily.  He agrees with the plan.

## 2023-07-28 LAB — PROSTATE-SPECIFIC AG, SERUM (LABCORP): Prostate Specific Ag, Serum: 3.1 ng/mL (ref 0.0–4.0)

## 2023-08-03 ENCOUNTER — Telehealth: Payer: Self-pay | Admitting: Nurse Practitioner

## 2023-08-08 NOTE — Progress Notes (Unsigned)
Palliative Medicine [facility]  [phone]) [phone] [phone]   Name: Terry [name] Date: 08/08/2023 MRN: [id number]  DOB: 10/08/1941  Patient Care Team: Donita Brooks, MD as PCP - General (Family Medicine) Mariah Milling Tollie Pizza, MD as Consulting Physician (Cardiology) Erroll Luna, [facility] (Inactive) as Pharmacist (Pharmacist) Malachy Mood, MD as Consulting Physician (Oncology)    REASON FOR CONSULTATION: Terry [name] is a 82 y.o. 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 Palliative ask to see for symptom management and goals of care.    SOCIAL HISTORY:     reports that he has never smoked. He has never been exposed to tobacco smoke. He has never used smokeless tobacco. He reports that he does not currently use alcohol. He reports that he does not currently use drugs after having used the following drugs: Marijuana.  ADVANCE DIRECTIVES:  None on file  CODE STATUS: Full code  PAST MEDICAL HISTORY: Past Medical History:  Diagnosis Date  . Arthritis   . Cancer (HCC)    [employer]reesia 08/04/2020  . CHF, chronic (HCC)    diastolic  . CKD (chronic kidney disease) stage 3, GFR 30-59 ml/min (HCC)   . Diabetes mellitus without complication (HCC)   . Dysrhythmia   . Hypertension   . Hypothyroidism   . Lymphedema   . Paroxysmal atrial fibrillation (HCC)   . Prostate cancer (HCC)   . Renal insufficiency     PAST SURGICAL HISTORY:  Past Surgical History:  Procedure Laterality Date  . COLON SURGERY N/A    [employer]rees[employer] 08/04/2020    HEMATOLOGY/ONCOLOGY HISTORY:  Oncology History Overview Note   Cancer Staging  Prostate cancer [facility]) Staging form: Prostate, AJCC 8th Edition - Clinical stage from 09/27/2021: Stage IVB (cTX, cNX, cM1b) - Signed by Creig Hines, MD on 09/27/2021     Prostate  cancer (HCC)  08/01/2020 Initial Diagnosis   Prostate cancer (HCC)   09/27/2021 Cancer Staging   Staging form: Prostate, AJCC 8th Edition - Clinical stage from 09/27/2021: Stage IVB (cTX, cNX, cM1b) - Signed by Creig Hines, MD on 09/27/2021   01/13/2023 Imaging    IMPRESSION: 1. Widespread bony metastatic disease involving axial and appendicular skeleton with marked increased radiotracer accumulation. Some areas with sclerosis and other areas with lucency or without CT correlate as discussed. Metastatic disease is much more widespread based on comparison with prior imaging which was not performed with PSMA PET. 2. Enlarging AP window lymph node with moderate radiotracer accumulation, suspicious for metastatic disease. Other lymph node in the RIGHT mediastinum without change with mild radiotracer accumulation. 3. Marked radiotracer accumulation in the prostate. 4. Small lymph nodes scattered throughout the pelvis with radiotracer accumulation that is mild-to-moderate, nonspecific but suspicious given other findings. Attention on follow-up. This includes bilateral groin lymph nodes largest on the RIGHT. 5. 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 6. Signs of prior colonic resection in the LEFT hemiabdomen.     ALLERGIES:  is allergic to lisinopril.  MEDICATIONS:  Current Outpatient Medications  Medication Sig Dispense Refill  . acetaminophen (TYLENOL) 325 MG tablet Take 2 tablets (650 mg total) by mouth every 6 (six) hours as needed for mild pain (or Fever >/= 101).    Marland Kitchen allopurinol (ZYLOPRIM) 100 MG tablet Take 0.5 tablets (50 mg total) by mouth daily. 30 tablet  6  . apixaban (ELIQUIS) 2.5 MG TABS tablet Take 1 tablet (2.5 mg total) by mouth 2 (two) times daily. 60 tablet 11  . carvedilol (COREG) 25 MG tablet Take 25 mg by mouth 2 (two) times daily.    . diclofenac Sodium (VOLTAREN) 1 % GEL Apply 2 g topically 4 (four) times daily.    .  furosemide (LASIX) 40 MG tablet Take 1 tablet (40 mg total) by mouth daily.    Marland Kitchen gabapentin (NEURONTIN) 300 MG capsule Take 1 capsule (300 mg total) by mouth 3 times/day as needed-between meals & bedtime. (Patient taking differently: Take 300 mg by mouth 3 (three) times daily.) 30 capsule 3  . hydrALAZINE (APRESOLINE) 50 MG tablet Take 1 tablet (50 mg total) by mouth every 8 (eight) hours. 90 tablet 0  . Iron, Ferrous Sulfate, 325 (65 Fe) MG TABS Take 325 mg by mouth daily. 30 tablet 3  . levothyroxine (SYNTHROID) 50 MCG tablet Take 1 tablet (50 mcg total) by mouth daily. 90 tablet 3  . losartan (COZAAR) 100 MG tablet Take 1 tablet (100 mg total) by mouth daily. 30 tablet 0  . potassium chloride (KLOR-CON M) 10 MEQ tablet Take 1 tablet (10 mEq total) by mouth daily.    . predniSONE (DELTASONE) 5 MG tablet Take 1 tablet (5 mg total) by mouth daily with breakfast. 90 tablet 1  . VITAMIN D PO Take 1 tablet by mouth daily.    Marland Kitchen VITAMIN E PO Take 1 tablet by mouth daily.     No current facility-administered medications for this visit.   Facility-Administered Medications Ordered in Other Visits  Medication Dose Route Frequency Provider Last Rate Last Admin  . leuprolide (6 Month) (ELIGARD) injection 45 mg  45 mg Subcutaneous Q6 months Creig Hines, MD   45 mg at 12/28/22 1549    VITAL SIGNS: There were no vitals taken for this visit. There were no vitals filed for this visit.  Estimated body mass index is 46.64 kg/m as calculated from the following:   Height as of 07/27/23: 5\' 7"  (1.702 m).   Weight as of 07/27/23: 297 lb 12.8 oz (135.1 kg).  LABS: CBC:    Component Value Date/Time   WBC 7.7 07/27/2023 1332   WBC 6.6 07/09/2023 0412   HGB 11.9 (L) 07/27/2023 1332   HCT 38.7 (L) 07/27/2023 1332   PLT 202 07/27/2023 1332   MCV 80.3 07/27/2023 1332   NEUTROABS 6.4 07/27/2023 1332   LYMPHSABS 0.6 (L) 07/27/2023 1332   MONOABS 0.5 07/27/2023 1332   EOSABS 0.1 07/27/2023 1332   BASOSABS  0.0 07/27/2023 1332   Comprehensive Metabolic Panel:    Component Value Date/Time   NA 141 07/27/2023 1332   K 3.9 07/27/2023 1332   CL 105 07/27/2023 1332   CO2 29 07/27/2023 1332   BUN 23 07/27/2023 1332   CREATININE 1.37 (H) 07/27/2023 1332   CREATININE 1.55 (H) 07/01/2023 1242   GLUCOSE 88 07/27/2023 1332   CALCIUM 9.7 07/27/2023 1332   AST 11 (L) 07/27/2023 1332   ALT 6 07/27/2023 1332   ALKPHOS 64 07/27/2023 1332   BILITOT 0.3 07/27/2023 1332   PROT 8.1 07/27/2023 1332   ALBUMIN 3.9 07/27/2023 1332    RADIOGRAPHIC STUDIES: CT ABDOMEN PELVIS W CONTRAST  Result Date: 07/05/2023 CLINICAL DATA:  82 year old male with history of acute onset of nonlocalized abdominal pain. History of metastatic prostate cancer. * Tracking Code: BO * EXAM: CT ABDOMEN AND PELVIS WITH CONTRAST TECHNIQUE:  Multidetector CT imaging of the abdomen and pelvis was performed using the standard protocol following bolus administration of intravenous contrast. RADIATION DOSE REDUCTION: This exam was performed according to the departmental dose-optimization program which includes automated exposure control, adjustment of the mA and/or kV according to patient size and/or use of iterative reconstruction technique. CONTRAST:  75mL OMNIPAQUE IOHEXOL 350 MG/ML SOLN COMPARISON:  CT the abdomen and pelvis 09/30/2021. PET-CT 01/13/2023. FINDINGS: Lower chest: Unremarkable. Hepatobiliary: Subcentimeter low-attenuation lesion in segment 4B of the liver adjacent to the falciform ligament, too small to definitively characterize, but similar to prior studies and statistically likely a cyst (no imaging follow-up recommended). No other suspicious appearing hepatic lesions are noted. No intra or extrahepatic biliary ductal dilatation. Faint densities lie dependently in the gallbladder, likely biliary sludge balls or noncalcified gallstones. Gallbladder is nondistended. No pericholecystic fluid or surrounding inflammatory changes.  Pancreas: No pancreatic mass. No pancreatic ductal dilatation. No pancreatic or peripancreatic fluid collections or inflammatory changes. Spleen: Unremarkable. Adrenals/Urinary Tract: Severe atrophy of the right kidney again noted. Right-sided double-J ureteral stent in position with proximal loop reformed in the right renal pelvis and distal loop reformed in the urinary bladder. Severe chronic hydroureteronephrosis is minimally increased compared to prior study, terminating in the mid ureter where there is a 9 mm calculus adjacent to the stent (axial image 52 of series 3). Several curvilinear calcifications are also noted in the lower pole of the right kidney, some of which may represent calculi, while others may represent parenchymal calcifications. Left kidney and bilateral adrenal glands are normal in appearance. Urinary bladder is otherwise unremarkable in appearance. Stomach/Bowel: The appearance of the stomach is normal. There is no pathologic dilatation of small bowel or colon. Normal appendix. Vascular/Lymphatic: No significant atherosclerotic disease, aneurysm or dissection noted in the abdominal or pelvic vasculature. Multiple prominent borderline enlarged and mildly enlarged retroperitoneal and pelvic lymph nodes are noted. Clear enlargement of a left para-aortic lymph node adjacent to the left renal hilum (axial image 36 of series 3) which currently measures 1.7 cm in short axis (previously nonenlarged), concerning for metastatic disease. Other prominent retrocaval lymph node (axial image 33 of series 3) measuring 1.1 cm in short axis, and left inguinal lymph node (axial image 78 of series 3) measuring 11 mm in short axis also noted. Reproductive: Prostate gland and seminal vesicles are grossly unremarkable in appearance. Other: No significant volume of ascites.  No pneumoperitoneum. Musculoskeletal: Several sclerotic lesions are again noted in the visualized axial and appendicular skeleton, indicative  of metastatic disease to the bones in this patient with history of prostate cancer. This appears progressive, with enlargement of several lesions (best example is in the right side of S1 where there is a 1.9 cm sclerotic lesion which previously measured only 1 cm). The largest lesion is in the left side of the L5 vertebral body (axial image 49 of series 3) currently measuring 4.2 x 3.5 cm. IMPRESSION: 1. 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 2. No other definite acute findings noted in the abdomen or pelvis to account for the patient's symptoms. 3. 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 Increasing lymphadenopathy, most evident in the retroperitoneum also suspicious for metastatic disease in this patient with documented history of prostate cancer. 4. Probable noncalcified gallstones or biliary sludge balls in the gallbladder. No findings to suggest  an acute cholecystitis at this time. 5. Additional incidental findings, as above. Electronically Signed   By: Trudie Reed M.D.   On: 07/05/2023 05:24   CT Head Wo Contrast  Result Date: 07/04/2023 CLINICAL DATA:  Fall, trauma. EXAM: CT HEAD WITHOUT CONTRAST TECHNIQUE: Contiguous axial images were obtained from the base of the skull through the vertex without intravenous contrast. RADIATION DOSE REDUCTION: This exam was performed according to the departmental dose-optimization program which includes automated exposure control, adjustment of the mA and/or kV according to patient size and/or use of iterative reconstruction technique. COMPARISON:  None Available. FINDINGS: Brain: No evidence of acute infarction, hemorrhage, hydrocephalus, extra-axial collection or mass lesion/mass effect. There is mild diffuse atrophy and mild periventricular white matter  hypodensity. Vascular: Atherosclerotic calcifications are present within the cavernous internal carotid arteries. Skull: Normal. Negative for fracture or focal lesion. Sinuses/Orbits: No acute finding. Other: None. IMPRESSION: 1. No acute intracranial abnormality. 2. 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 Electronically Signed   By: Darliss Cheney M.D.   On: 07/04/2023 23:59    PERFORMANCE STATUS (ECOG) : {CHL ONC ECOG ZO:[id number]}  Review of Systems Unless otherwise noted, a complete review of systems is negative.  Physical Exam General: NAD Cardiovascular: regular rate and rhythm Pulmonary: clear ant fields Abdomen: soft, nontender, + bowel sounds Extremities: no edema, no joint deformities Skin: no rashes Neurological:  IMPRESSION: *** I introduced myself, Maygan RN, and Palliative's role in collaboration with the oncology team. Concept of Palliative Care was introduced as specialized medical care for people and their families living with serious illness.  It focuses on providing relief from the symptoms and stress of a serious illness.  The goal is to improve quality of life for both the patient and the family. Values and goals of care important to patient and family were attempted to be elicited.    We discussed *** current illness and what it means in the larger context of *** on-going co-morbidities. Natural disease trajectory and expectations were discussed.  I discussed the importance of continued conversation with family and their medical providers regarding overall plan of care and treatment options, ensuring decisions are within the context of the patients values and GOCs.  PLAN: Established therapeutic relationship. Education provided on palliative's role in collaboration with their Oncology/Radiation team. I will plan to see patient back in 2-4 weeks in collaboration to other oncology appointments.     Patient expressed understanding and was in agreement with this plan. He also understands that He can call the clinic at any time with any questions, concerns, or complaints.   Thank you for your referral and allowing Palliative to assist in Mr. Chasin Donsbach care.   Number and complexity of problems addressed: ***HIGH - 1 or more chronic illnesses with SEVERE exacerbation, progression, or side effects of treatment - advanced cancer, pain. Any controlled substances utilized were prescribed in the context of palliative care.   Visit consisted of counseling and education dealing with the complex and emotionally intense issues of symptom management and palliative care in the setting of serious and potentially life-threatening illness.Greater than 50%  of this time was spent counseling and coordinating care related to the above assessment and plan.  Signed by: Willette Alma, AGPCNP-BC Palliative Medicine Team/ Cancer Center   *Please note that this is a verbal dictation therefore any spelling or grammatical errors are due to the "[employer]" system interpretation.

## 2023-08-09 ENCOUNTER — Encounter: Payer: Self-pay | Admitting: Family Medicine

## 2023-08-09 ENCOUNTER — Ambulatory Visit (INDEPENDENT_AMBULATORY_CARE_PROVIDER_SITE_OTHER): Payer: Medicare Other | Admitting: Family Medicine

## 2023-08-09 VITALS — BP 126/72 | HR 57 | Temp 98.5°F | Ht 67.0 in | Wt 297.0 lb

## 2023-08-09 DIAGNOSIS — I509 Heart failure, unspecified: Secondary | ICD-10-CM

## 2023-08-09 DIAGNOSIS — R531 Weakness: Secondary | ICD-10-CM | POA: Diagnosis not present

## 2023-08-09 DIAGNOSIS — I480 Paroxysmal atrial fibrillation: Secondary | ICD-10-CM

## 2023-08-09 DIAGNOSIS — C61 Malignant neoplasm of prostate: Secondary | ICD-10-CM

## 2023-08-09 DIAGNOSIS — C7951 Secondary malignant neoplasm of bone: Secondary | ICD-10-CM

## 2023-08-09 MED ORDER — TRAMADOL HCL 50 MG PO TABS
50.0000 mg | ORAL_TABLET | Freq: Three times a day (TID) | ORAL | 0 refills | Status: DC | PRN
Start: 2023-08-09 — End: 2023-09-03

## 2023-08-09 NOTE — Progress Notes (Signed)
Subjective:    Patient ID: Terry [name], male    DOB: 1941/10/19, 82 y.o.   MRN: [id number]  Patient has a history of a retained right ureteral stent right-sided hydronephrosis, CHF with preserved EF,  and chronic kidney disease.  He also has a history of metastatic prostate cancer.  He also has a history of noncompliance with medication therapy.  Was recently admitted to the hospital: Admit date: 07/04/2023 Discharge date: 07/11/2023   Admitted From: home Discharge disposition: SNF     Recommendations for Outpatient Follow-Up:    New med: LASIX-- will need BMP in 1 week and adjustment of dose Palliative care to follow at SNF vs outpatient cancer center for GOC     Discharge Diagnosis:    Principal Problem:   Left lower lobe pneumonia Active Problems:   Prostate cancer metastatic to bone [facility])   Weakness   Palliative care encounter       Discharge Condition: Improved.   Diet recommendation: Low sodium, heart healthy.    Wound care: None.   Code status: Full.     History of Present Illness:    Terry [name] is an 82 y.o. male with a history of metastatic prostate cancer, iron deficiency anemia, CKD 3, HTN, PAF on Eliquis, diastolic CHF, chronic R ureteral stone, and hypothyroidism who presented to the ER via EMS with left leg pain.  He reportedly slid out of his bed 24 hours prior.  He was unable to ambulate due to the pain, and generalized weakness.  Blood pressure was elevated at 190/100 at presentation.  He reported persistent severe deconditioning and an inability to care for himself since the time of his discharge from a skilled nursing facility about 5 days before.      Hospital Course by Problem:    Generalized fatigue / Generalized Failure to Thrive - Severe deconditioning  It appears the patient is no longer safe for living at home PT/OT-SNF [facility] consulted   Known history of stage IV prostate cancer with worsening metastasis  [place] in [place] every 2 to 3 months for bone metastasis; continues on Eligard and prednisone Last seen by Oncology team 8/14 and due to worsening fatigue Zytiga was stopped Primary oncologist is Dr. 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 Left lower lobe pneumonia (reported on CXR report) - clinically ruled out No clinical signs of pneumonia so we will discontinue antibiotics   Left knee pain after fall PT and OT consultation- SNF   CKD IIIa Renal function stable currently   Iron deficiency anemia /anemia due to metastatic bone malignancy -follow up outpatient   Acute on Chronic Diastolic CHF  Continue carvedilol, Apresoline, and Cozaar -lasix added-- will need outpatient adjustment   PAF Currently rate controlled Continue Eliquis   Hypothyroidism Continue Synthroid    08/09/23 Patient was admitted to the hospital in late July.  I saw the patient after he was discharged from  rehab.  At that time he reported weakness and deconditioning and was considering going back to a skilled nursing facility.  The patient was at home a short time after that fell and was unable to get out of the floor.  He also developed severe left leg pain.  This prompted him to go back to the hospital in late August.  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  The patient  was admitted essentially for failure to thrive and to facilitate nursing home placement as he was no longer able to care for himself at home.  He also had his blood pressure medication adjusted.  The patient spent only 30 days in rehab.  However at the time he is likely back home.  Going home he has progressively gotten weaker again.  He is here today in a wheelchair.  He reports weakness with standing.  He reports weakness with walking.  He is not performing any of the exercises at home that they taught him the physical therapy.  He is becoming progressively more deconditioned.  He denies any chest pain or shortness of breath.  He lives with his son who has mental illness and is unable to supervise him.  His daughter is here for short time but is then leaving to go back to her home. Past Medical History:  Diagnosis Date   Arthritis    Cancer (HCC)    Phreesia 08/04/2020   CHF, chronic (HCC)    diastolic   CKD (chronic kidney disease) stage 3, GFR 30-59 ml/min (HCC)    Diabetes mellitus without complication (HCC)    Dysrhythmia    Hypertension    Hypothyroidism    Lymphedema    Paroxysmal atrial fibrillation (HCC)    Prostate cancer [facility])    Renal insufficiency    Past Surgical History:  Procedure Laterality Date   COLON SURGERY N/A    Phreesia 08/04/2020    Allergies  Allergen Reactions   Lisinopril     FACIAL SWELLING   Social History   Socioeconomic History   Marital status: Widowed    Spouse name: Not on file   Number of children: 5   Years of education: 15   Highest education [name]: 12th grade  Occupational History   Not on file  Tobacco Use   Smoking status: Never    Passive exposure: Never   Smokeless tobacco: Never  Vaping Use   Vaping status: Never Used  Substance and Sexual Activity   Alcohol use: Not Currently    Comment: rarely   Drug use: Not Currently    Types: Marijuana    Comment: stopped in 2000   Sexual activity: Not Currently  Other Topics  Concern   Not on file  Social History Narrative   Lives with son. Daughter lives in [place].   Social Determinants of Health   Financial Resource Strain: Low Risk  (12/17/2022)   Overall Financial Resource Strain (CARDIA)    Difficulty of Paying Living Expenses: Not very hard  Food Insecurity: Food Insecurity Present (07/05/2023)   Hunger Vital Sign    Worried About Running Out of Food in the Last Year: Sometimes true    Ran Out of Food in the Last Year: Sometimes true  Transportation Needs: Unmet Transportation Needs (07/05/2023)   PRAPARE - Administrator, Civil Service (Medical): Yes    Lack of Transportation (Non-Medical): Yes  Physical Activity: Insufficiently Active (12/17/2022)   Exercise Vital Sign    Days of Exercise per Week: 2 days    Minutes of Exercise per Session: 10 min  Stress: No Stress Concern Present (12/17/2022)   [employer] of Occupational Health - Occupational Stress Questionnaire    Feeling of Stress : Not at all  Social Connections: Socially Isolated (12/17/2022)   Social Connection and Isolation Panel [NHANES]    Frequency of Communication with Friends and Family: More than three times a week    Frequency of Social Gatherings with Friends and Family: Never    Attends Religious Services: Never    Database administrator or Organizations: No    Attends Banker Meetings: Never    Marital Status: Widowed  Intimate Partner Violence: Not At Risk (07/05/2023)   Humiliation, Afraid, Rape, and Kick questionnaire    Fear of Current or Ex-Partner: No    Emotionally Abused: No    Physically Abused: No    Sexually Abused: No    Past Medical History:  Diagnosis Date   Arthritis    Cancer (HCC)    Phreesia 08/04/2020   CHF, chronic (HCC)    diastolic   CKD (chronic kidney disease) stage 3, GFR 30-59 ml/min (HCC)    Diabetes mellitus without complication (HCC)    Dysrhythmia    Hypertension    Hypothyroidism    Lymphedema    Paroxysmal  atrial fibrillation (HCC)    Prostate cancer (HCC)    Renal insufficiency       Review of Systems     Objective:   Physical Exam Vitals reviewed.  Constitutional:      General: He is not in acute distress.    Appearance: He is obese. He is not ill-appearing, toxic-appearing or diaphoretic.  HENT:     Head: Normocephalic and atraumatic.     Nose: Nose normal. No congestion or rhinorrhea.     Mouth/Throat:     Pharynx: No oropharyngeal exudate or posterior oropharyngeal erythema.  Eyes:     Extraocular Movements: Extraocular movements intact.     Conjunctiva/sclera: Conjunctivae normal.     Pupils: Pupils are equal, round, and reactive to light.  Cardiovascular:     Rate and Rhythm: Normal rate and regular rhythm.     Heart sounds: Normal heart sounds. No murmur heard.    No friction rub. No gallop.  Pulmonary:     Effort: Pulmonary effort is normal. No respiratory distress.     Breath sounds: Normal breath sounds. No stridor. No wheezing, rhonchi or rales.  Chest:     Chest wall: No tenderness.  Abdominal:     General: Abdomen is flat. Bowel sounds are normal. There is no distension.     Palpations: Abdomen is soft. There is no mass.     Tenderness: There is no abdominal tenderness. There is no guarding or rebound.     Hernia: No hernia is present.  Musculoskeletal:        General: Swelling present.     Right lower leg: Edema present.     Left lower leg: Edema present.  Neurological:     General: No focal deficit present.     Mental Status: He is alert and oriented to person, place, and time. Mental status is at baseline.     Cranial Nerves: No cranial nerve deficit.     Sensory: No sensory deficit.     Motor: No weakness.     Coordination: Coordination  normal.     Gait: Gait normal.       Assessment & Plan:  Chronic congestive heart failure, unspecified heart failure type (HCC) - Plan: CBC with Differential/Platelet, COMPLETE METABOLIC PANEL WITH  GFR  Weakness  Paroxysmal atrial fibrillation (HCC)  Morbid obesity due to excess calories [facility])  Prostate cancer metastatic to bone [facility]) Again, the patient was doing better at the nursing home until he was discharged home.  I had a long discussion with the patient.  I stated that the situation has not changed and that he is going to fall again or be readmitted if he does not make a change.  He has to start exercising his legs at home and performing the physical therapy exercises or his legs will become so deconditioned that he will experience the same outcome as before.  We spent more than 15 minutes today discussing exercises he can do.  I recommended that he sit in a chair, place his walker in front of the chair, and stand up and sit down in and out of the chair holding onto the walker to develop the strength in his quads and his hamstrings.  Also recommended walking using a walker to build up conditioning in his legs.  He has home physical therapy coming out.  His blood pressure today is excellent.  However compliance with medication has been an issue.  I recommended that his daughter get a pillbox that has morning noon and night.  He will take gabapentin and hydralazine 3 times a day.  He will take carvedilol and Eliquis twice a day.  All other medication he can take once a day in the morning.  He can take tramadol up to every 8 hours as needed for pain in his knees due to arthritis.  I will give him 60 pills and refill them as needed.  However I do not believe that the patient is going to be able to thrive at home.  His prognosis is guarded

## 2023-08-10 ENCOUNTER — Inpatient Hospital Stay: Payer: Medicare Other | Admitting: Licensed Clinical Social Worker

## 2023-08-10 ENCOUNTER — Inpatient Hospital Stay (HOSPITAL_BASED_OUTPATIENT_CLINIC_OR_DEPARTMENT_OTHER): Payer: Medicare Other | Admitting: Nurse Practitioner

## 2023-08-10 ENCOUNTER — Other Ambulatory Visit: Payer: Self-pay

## 2023-08-10 ENCOUNTER — Encounter: Payer: Self-pay | Admitting: Nurse Practitioner

## 2023-08-10 VITALS — BP 159/74 | HR 57 | Temp 98.9°F | Resp 16 | Wt 304.2 lb

## 2023-08-10 DIAGNOSIS — C61 Malignant neoplasm of prostate: Secondary | ICD-10-CM | POA: Diagnosis not present

## 2023-08-10 DIAGNOSIS — Z7189 Other specified counseling: Secondary | ICD-10-CM

## 2023-08-10 DIAGNOSIS — C7951 Secondary malignant neoplasm of bone: Secondary | ICD-10-CM

## 2023-08-10 DIAGNOSIS — G893 Neoplasm related pain (acute) (chronic): Secondary | ICD-10-CM

## 2023-08-10 DIAGNOSIS — Z515 Encounter for palliative care: Secondary | ICD-10-CM | POA: Diagnosis not present

## 2023-08-10 DIAGNOSIS — R530 Neoplastic (malignant) related fatigue: Secondary | ICD-10-CM

## 2023-08-10 LAB — CBC WITH DIFFERENTIAL/PLATELET
Absolute Monocytes: 574 cells/uL (ref 200–950)
Basophils Absolute: 33 cells/uL (ref 0–200)
Basophils Relative: 0.5 %
Eosinophils Absolute: 99 cells/uL (ref 15–500)
Eosinophils Relative: 1.5 %
HCT: 36.1 % — ABNORMAL LOW (ref 38.5–50.0)
Hemoglobin: 10.8 g/dL — ABNORMAL LOW (ref 13.2–17.1)
Lymphs Abs: 706 cells/uL — ABNORMAL LOW (ref 850–3900)
MCH: 24 pg — ABNORMAL LOW (ref 27.0–33.0)
MCHC: 29.9 g/dL — ABNORMAL LOW (ref 32.0–36.0)
MCV: 80.2 fL (ref 80.0–100.0)
MPV: 11 fL (ref 7.5–12.5)
Monocytes Relative: 8.7 %
Neutro Abs: 5188 cells/uL (ref 1500–7800)
Neutrophils Relative %: 78.6 %
Platelets: 184 10*3/uL (ref 140–400)
RBC: 4.5 10*6/uL (ref 4.20–5.80)
RDW: 15.5 % — ABNORMAL HIGH (ref 11.0–15.0)
Total Lymphocyte: 10.7 %
WBC: 6.6 10*3/uL (ref 3.8–10.8)

## 2023-08-10 LAB — COMPLETE METABOLIC PANEL WITH GFR
AG Ratio: 1.2 (calc) (ref 1.0–2.5)
ALT: 7 U/L — ABNORMAL LOW (ref 9–46)
AST: 10 U/L (ref 10–35)
Albumin: 3.9 g/dL (ref 3.6–5.1)
Alkaline phosphatase (APISO): 56 U/L (ref 35–144)
BUN: 25 mg/dL (ref 7–25)
CO2: 22 mmol/L (ref 20–32)
Calcium: 8.7 mg/dL (ref 8.6–10.3)
Chloride: 110 mmol/L (ref 98–110)
Creat: 1.19 mg/dL (ref 0.70–1.22)
Globulin: 3.2 g/dL (calc) (ref 1.9–3.7)
Glucose, Bld: 93 mg/dL (ref 65–99)
Potassium: 4.1 mmol/L (ref 3.5–5.3)
Sodium: 140 mmol/L (ref 135–146)
Total Bilirubin: 0.3 mg/dL (ref 0.2–1.2)
Total Protein: 7.1 g/dL (ref 6.1–8.1)
eGFR: 61 mL/min/{1.73_m2} (ref >=60)

## 2023-08-10 NOTE — Progress Notes (Signed)
[facility] CSW Progress Note  Visual merchandiser  received a message to contact pt regarding questions about resources.  Pt states he was signed up for transportation, but has lost the number to call to book transportation for his appointment.  CSW sent a message to the transportation department requesting a call to pt to provide with contact information.  Pt also inquired about joining the prostate cancer support group.  CSW added pt to the group list and at pt's request mailed a flyer of all of the supportive services to pt's home.  CSW to remain available to provide support as appropriate throughout duration of treatment.        Rachel Moulds, LCSW Clinical Social Worker [facility]

## 2023-08-11 ENCOUNTER — Encounter: Payer: Self-pay | Admitting: Oncology

## 2023-08-12 ENCOUNTER — Telehealth: Payer: Self-pay | Admitting: Family Medicine

## 2023-08-12 NOTE — Telephone Encounter (Signed)
Received call from [place] Welch with [facility] to request verbal orders for the following:  - Skilled nursing OT & PT once a week for 3 weeks  Please advise at [phone]; ok to leave a message on secured voicemail.

## 2023-08-15 ENCOUNTER — Encounter: Payer: Self-pay | Admitting: Family Medicine

## 2023-08-16 ENCOUNTER — Other Ambulatory Visit: Payer: Self-pay | Admitting: Family Medicine

## 2023-08-16 ENCOUNTER — Inpatient Hospital Stay: Payer: Medicare Other | Attending: Hematology | Admitting: Licensed Clinical Social Worker

## 2023-08-16 DIAGNOSIS — R531 Weakness: Secondary | ICD-10-CM

## 2023-08-16 DIAGNOSIS — C61 Malignant neoplasm of prostate: Secondary | ICD-10-CM

## 2023-08-16 NOTE — Progress Notes (Signed)
[facility] CSW Progress Note  Clinical Child psychotherapist  received a call from pt's daughter Misty Stanley inquiring about transportation services for her father.  CSW informed Misty Stanley that the transportation department has been made aware of pt's potential need for transportation and will be able to schedule transportation to the cancer center if needed.  CSW to remain available as appropriate to provide support throughout duration of treatment.        Rachel Moulds, LCSW Clinical Social Worker [facility]

## 2023-09-02 ENCOUNTER — Other Ambulatory Visit: Payer: Self-pay

## 2023-09-02 ENCOUNTER — Encounter (HOSPITAL_COMMUNITY): Payer: Self-pay

## 2023-09-02 ENCOUNTER — Emergency Department (HOSPITAL_COMMUNITY)
Admission: EM | Admit: 2023-09-02 | Discharge: 2023-09-16 | Disposition: E | Payer: Medicare Other | Attending: Emergency Medicine | Admitting: Emergency Medicine

## 2023-09-02 ENCOUNTER — Emergency Department (HOSPITAL_COMMUNITY): Payer: Medicare Other

## 2023-09-02 DIAGNOSIS — Z833 Family history of diabetes mellitus: Secondary | ICD-10-CM | POA: Insufficient documentation

## 2023-09-02 DIAGNOSIS — E039 Hypothyroidism, unspecified: Secondary | ICD-10-CM | POA: Insufficient documentation

## 2023-09-02 DIAGNOSIS — I130 Hypertensive heart and chronic kidney disease with heart failure and stage 1 through stage 4 chronic kidney disease, or unspecified chronic kidney disease: Secondary | ICD-10-CM | POA: Diagnosis not present

## 2023-09-02 DIAGNOSIS — E1122 Type 2 diabetes mellitus with diabetic chronic kidney disease: Secondary | ICD-10-CM | POA: Insufficient documentation

## 2023-09-02 DIAGNOSIS — E119 Type 2 diabetes mellitus without complications: Secondary | ICD-10-CM | POA: Insufficient documentation

## 2023-09-02 DIAGNOSIS — Z8546 Personal history of malignant neoplasm of prostate: Secondary | ICD-10-CM | POA: Diagnosis not present

## 2023-09-02 DIAGNOSIS — N183 Chronic kidney disease, stage 3 unspecified: Secondary | ICD-10-CM | POA: Insufficient documentation

## 2023-09-02 DIAGNOSIS — J9601 Acute respiratory failure with hypoxia: Secondary | ICD-10-CM | POA: Insufficient documentation

## 2023-09-02 DIAGNOSIS — I5032 Chronic diastolic (congestive) heart failure: Secondary | ICD-10-CM | POA: Diagnosis not present

## 2023-09-02 DIAGNOSIS — I4891 Unspecified atrial fibrillation: Secondary | ICD-10-CM | POA: Diagnosis not present

## 2023-09-02 DIAGNOSIS — Z79899 Other long term (current) drug therapy: Secondary | ICD-10-CM | POA: Diagnosis not present

## 2023-09-02 DIAGNOSIS — I619 Nontraumatic intracerebral hemorrhage, unspecified: Secondary | ICD-10-CM | POA: Diagnosis not present

## 2023-09-02 DIAGNOSIS — Z8249 Family history of ischemic heart disease and other diseases of the circulatory system: Secondary | ICD-10-CM | POA: Diagnosis not present

## 2023-09-02 DIAGNOSIS — Z20822 Contact with and (suspected) exposure to covid-19: Secondary | ICD-10-CM | POA: Insufficient documentation

## 2023-09-02 DIAGNOSIS — Z7901 Long term (current) use of anticoagulants: Secondary | ICD-10-CM | POA: Insufficient documentation

## 2023-09-02 DIAGNOSIS — R4182 Altered mental status, unspecified: Secondary | ICD-10-CM | POA: Diagnosis present

## 2023-09-02 LAB — COMPREHENSIVE METABOLIC PANEL
ALT: 14 U/L (ref 0–44)
AST: 24 U/L (ref 15–41)
Albumin: 3.3 g/dL — ABNORMAL LOW (ref 3.5–5.0)
Alkaline Phosphatase: 61 U/L (ref 38–126)
Anion gap: 12 (ref 5–15)
BUN: 20 mg/dL (ref 8–23)
CO2: 21 mmol/L — ABNORMAL LOW (ref 22–32)
Calcium: 9.3 mg/dL (ref 8.9–10.3)
Chloride: 108 mmol/L (ref 98–111)
Creatinine, Ser: 1.28 mg/dL — ABNORMAL HIGH (ref 0.61–1.24)
GFR, Estimated: 56 mL/min — ABNORMAL LOW (ref >60)
Glucose, Bld: 195 mg/dL — ABNORMAL HIGH (ref 70–99)
Potassium: 3.5 mmol/L (ref 3.5–5.1)
Sodium: 141 mmol/L (ref 135–145)
Total Bilirubin: 0.4 mg/dL (ref 0.3–1.2)
Total Protein: 7.5 g/dL (ref 6.5–8.1)

## 2023-09-02 LAB — I-STAT ARTERIAL BLOOD GAS, ED
Acid-base deficit: 2 mmol/L (ref 0.0–2.0)
Bicarbonate: 22.3 mmol/L (ref 20.0–28.0)
Calcium, Ion: 1.18 mmol/L (ref 1.15–1.40)
HCT: 35 % — ABNORMAL LOW (ref 39.0–52.0)
Hemoglobin: 11.9 g/dL — ABNORMAL LOW (ref 13.0–17.0)
O2 Saturation: 100 %
Potassium: 2.8 mmol/L — ABNORMAL LOW (ref 3.5–5.1)
Sodium: 144 mmol/L (ref 135–145)
TCO2: 23 mmol/L (ref 22–32)
pCO2 arterial: 37.8 mm[Hg] (ref 32–48)
pH, Arterial: 7.38 (ref 7.35–7.45)
pO2, Arterial: 199 mm[Hg] — ABNORMAL HIGH (ref 83–108)

## 2023-09-02 LAB — CBC WITH DIFFERENTIAL/PLATELET
Abs Immature Granulocytes: 0.02 10*3/uL (ref 0.00–0.07)
Basophils Absolute: 0 10*3/uL (ref 0.0–0.1)
Basophils Relative: 0 %
Eosinophils Absolute: 0 10*3/uL (ref 0.0–0.5)
Eosinophils Relative: 0 %
HCT: 37.8 % — ABNORMAL LOW (ref 39.0–52.0)
Hemoglobin: 11.6 g/dL — ABNORMAL LOW (ref 13.0–17.0)
Immature Granulocytes: 0 %
Lymphocytes Relative: 9 %
Lymphs Abs: 0.6 10*3/uL — ABNORMAL LOW (ref 0.7–4.0)
MCH: 24.1 pg — ABNORMAL LOW (ref 26.0–34.0)
MCHC: 30.7 g/dL (ref 30.0–36.0)
MCV: 78.6 fL — ABNORMAL LOW (ref 80.0–100.0)
Monocytes Absolute: 0.3 10*3/uL (ref 0.1–1.0)
Monocytes Relative: 4 %
Neutro Abs: 5.6 10*3/uL (ref 1.7–7.7)
Neutrophils Relative %: 87 %
Platelets: 222 10*3/uL (ref 150–400)
RBC: 4.81 MIL/uL (ref 4.22–5.81)
RDW: 17.9 % — ABNORMAL HIGH (ref 11.5–15.5)
WBC: 6.5 10*3/uL (ref 4.0–10.5)
nRBC: 0 % (ref 0.0–0.2)

## 2023-09-02 LAB — I-STAT CHEM 8, ED
BUN: 24 mg/dL — ABNORMAL HIGH (ref 8–23)
Calcium, Ion: 1.1 mmol/L — ABNORMAL LOW (ref 1.15–1.40)
Chloride: 112 mmol/L — ABNORMAL HIGH (ref 98–111)
Creatinine, Ser: 1.1 mg/dL (ref 0.61–1.24)
Glucose, Bld: 187 mg/dL — ABNORMAL HIGH (ref 70–99)
HCT: 37 % — ABNORMAL LOW (ref 39.0–52.0)
Hemoglobin: 12.6 g/dL — ABNORMAL LOW (ref 13.0–17.0)
Potassium: 3.8 mmol/L (ref 3.5–5.1)
Sodium: 146 mmol/L — ABNORMAL HIGH (ref 135–145)
TCO2: 21 mmol/L — ABNORMAL LOW (ref 22–32)

## 2023-09-02 LAB — RESP PANEL BY RT-PCR (RSV, FLU A&B, COVID)  RVPGX2
Influenza A by PCR: NEGATIVE
Influenza B by PCR: NEGATIVE
Resp Syncytial Virus by PCR: NEGATIVE
SARS Coronavirus 2 by RT PCR: NEGATIVE

## 2023-09-02 LAB — I-STAT CG4 LACTIC ACID, ED: Lactic Acid, Venous: 3.4 mmol/L (ref 0.5–1.9)

## 2023-09-02 LAB — PROTIME-INR
INR: 1.3 — ABNORMAL HIGH (ref 0.8–1.2)
Prothrombin Time: 16.2 s — ABNORMAL HIGH (ref 11.4–15.2)

## 2023-09-02 LAB — TSH: TSH: 0.87 u[IU]/mL (ref 0.350–4.500)

## 2023-09-02 LAB — BRAIN NATRIURETIC PEPTIDE: B Natriuretic Peptide: 273.3 pg/mL — ABNORMAL HIGH (ref 0.0–100.0)

## 2023-09-02 LAB — CK: Total CK: 57 U/L (ref 49–397)

## 2023-09-02 LAB — T4, FREE: Free T4: 1.15 ng/dL — ABNORMAL HIGH (ref 0.61–1.12)

## 2023-09-02 LAB — TROPONIN I (HIGH SENSITIVITY): Troponin I (High Sensitivity): 8 ng/L (ref <18)

## 2023-09-02 LAB — LIPASE, BLOOD: Lipase: 25 U/L (ref 11–51)

## 2023-09-02 MED ORDER — MAGNESIUM SULFATE 2 GM/50ML IV SOLN
2.0000 g | Freq: Once | INTRAVENOUS | Status: AC
  Administered 2023-09-02: 2 g via INTRAVENOUS
  Filled 2023-09-02: qty 50

## 2023-09-02 MED ORDER — IOHEXOL 350 MG/ML SOLN
75.0000 mL | Freq: Once | INTRAVENOUS | Status: AC | PRN
  Administered 2023-09-02: 75 mL via INTRAVENOUS

## 2023-09-02 MED ORDER — PROPOFOL 1000 MG/100ML IV EMUL
0.0000 ug/kg/min | INTRAVENOUS | Status: DC
  Administered 2023-09-02: 5 ug/kg/min via INTRAVENOUS

## 2023-09-02 MED ORDER — SODIUM CHLORIDE 0.9 % IV BOLUS
1000.0000 mL | Freq: Once | INTRAVENOUS | Status: AC
  Administered 2023-09-02: 1000 mL via INTRAVENOUS

## 2023-09-02 MED ORDER — ETOMIDATE 2 MG/ML IV SOLN
20.0000 mg | Freq: Once | INTRAVENOUS | Status: AC
  Administered 2023-09-02: 20 mg via INTRAVENOUS

## 2023-09-02 MED ORDER — PROPOFOL 1000 MG/100ML IV EMUL
INTRAVENOUS | Status: AC
  Filled 2023-09-02: qty 100

## 2023-09-02 MED ORDER — FENTANYL CITRATE PF 50 MCG/ML IJ SOSY: 50.0000 ug | PREFILLED_SYRINGE | INTRAMUSCULAR | Status: DC | PRN

## 2023-09-02 MED ORDER — ROCURONIUM BROMIDE 50 MG/5ML IV SOLN
100.0000 mg | Freq: Once | INTRAVENOUS | Status: AC
  Administered 2023-09-02: 100 mg via INTRAVENOUS

## 2023-09-08 ENCOUNTER — Telehealth: Payer: Self-pay

## 2023-09-08 NOTE — Telephone Encounter (Signed)
[employer] called in to ask pcp to electronically sign death certificate. Family would like to have deceased relocated to another state. Funeral home cannot do this without signing of death certificate. Funeral home stated all info has already been provided to [facility] just awaiting signature.  [employer] cabin crew and [employer] [phone]

## 2023-09-12 ENCOUNTER — Telehealth: Payer: Self-pay

## 2023-09-12 NOTE — Telephone Encounter (Signed)
Pt's daughter called with concerns about the cause of death. Pt's daughter would like to speak with nurse/pcp please regarding this.  Please contact Lonna Cobb at [phone]

## 2023-09-16 NOTE — ED Notes (Signed)
I called [facility] and they stated pt not a candidate at this time, but call back after pt's death. Ref # [id number] Terry [name]

## 2023-09-16 NOTE — Progress Notes (Signed)
Responded to page to support patient and staff.  Patient is actively passing.  Family out of country but has been informed of patient Status.  Chaplain provided ministry of presence, emotional and spiritual support to patient.  Terry [name], [place], [facility], Pager [phone]

## 2023-09-16 NOTE — Progress Notes (Signed)
Post intubation ABG obtained on ventilator settings of VT: 510, RR: 20, FIO2: 100%, and PEEP: 5.  Decreased FIO2 to 70%.  No other changes at this time.  Will continue to monitor.    Latest Reference Range & Units 09/13/2023 09:56  Sample type  ARTERIAL  pH, Arterial 7.35 - 7.45  7.380  pCO2 arterial 32 - 48 mmHg 37.8  pO2, Arterial 83 - 108 mmHg 199 (H)  TCO2 22 - 32 mmol/L 23  Acid-base deficit 0.0 - 2.0 mmol/L 2.0  Bicarbonate 20.0 - 28.0 mmol/L 22.3  O2 Saturation % 100  Collection site  RADIAL, ALLEN'S TEST ACCEPTABLE

## 2023-09-16 NOTE — Progress Notes (Signed)
Patient transported to CT and back to trauma room A, on ventilator, without complications.

## 2023-09-16 NOTE — ED Provider Notes (Signed)
[place] EMERGENCY DEPARTMENT AT [facility] Provider Note  CSN: [id number] Arrival date & time: 09/27/23 0846  Chief Complaint(s) unresponsive  HPI Terry [name] is a 82 y.o. male with past medical history as below, significant for metastatic prostate cancer, CKD stage III, DM, HTN, A-fib, obesity who presents to the ED with complaint of AMS  Patient here from home with EMS.  Altered mental status, last known normal was over 24 hours ago per EMS.  Nonresponsive to noxious stimulus per EMS on arrival.  On arrival patient unresponsive, no response to noxious stimulus.  Unable to provide history.  Family not at bedside.  He is full code per chart review.  Proceed with emergent intubation and resuscitation   Past Medical History Past Medical History:  Diagnosis Date   Arthritis    Cancer (HCC)    Phreesia 08/04/2020   CHF, chronic (HCC)    diastolic   CKD (chronic kidney disease) stage 3, GFR 30-59 ml/min (HCC)    Diabetes mellitus without complication (HCC)    Dysrhythmia    Hypertension    Hypothyroidism    Lymphedema    Paroxysmal atrial fibrillation (HCC)    Prostate cancer [facility])    Renal insufficiency    Patient Active Problem List   Diagnosis Date Noted   Weakness 07/09/2023   Palliative care encounter 07/09/2023   Prostate cancer metastatic to bone (HCC) 07/06/2023   Left lower lobe pneumonia 07/05/2023   Acute cystitis 06/01/2023   Generalized weakness 05/31/2023   Urinary incontinence 05/09/2023   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) 02/24/2023   Asymptomatic microscopic hematuria 01/22/2022   Benign prostatic hyperplasia with lower urinary tract symptoms 01/22/2022   Nocturia 01/22/2022   Retained ureteral stent 01/22/2022   Goals of care, counseling/discussion 09/04/2021   Sepsis (HCC) 10/24/2020   Complicated UTI (urinary tract infection) 10/24/2020   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)  10/24/2020   CHF, chronic (HCC)    Prostate cancer (HCC)    Congestive heart failure (HCC) 07/02/2020   Macrocytic anemia 06/30/2020   Elevated PSA 06/30/2020   Cellulitis of lower extremity 06/30/2020   Prediabetes 01/04/2019   Paroxysmal atrial fibrillation (HCC) 01/01/2019   Risk for falls 12/27/2017   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HCC) 10/18/2017   Right ureteral stone 10/01/2017   Iron deficiency anemia due to chronic blood loss 06/22/2016   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   Depression with anxiety 06/19/2015   CKD (chronic kidney disease) stage 3, GFR 30-59 ml/min (HCC) 05/26/2015   Essential hypertension 11/01/2014   Gouty arthritis of toe 11/01/2014   Morbid obesity due to excess calories (HCC) 11/01/2014   Primary osteoarthritis of both knees 11/01/2014   Home Medication(s) Prior to Admission medications   Medication Sig Start Date End Date Taking? Authorizing Provider  acetaminophen (TYLENOL) 325 MG tablet Take 2 tablets (650 mg total) by mouth every 6 (six) hours as needed for mild pain (or Fever >/= 101). 07/11/23   Marlin Canary U, DO  allopurinol (ZYLOPRIM) 100 MG tablet Take 0.5 tablets (50 mg total) by mouth daily. 07/22/22   Donita Brooks, MD  apixaban (ELIQUIS) 2.5 MG TABS tablet Take 1 tablet (2.5 mg total) by mouth 2 (two) times daily. 07/22/22   Donita Brooks, MD  carvedilol (COREG) 25 MG tablet Take 25 mg by mouth 2 (two) times daily. 02/15/23   [provider]  diclofenac  Signed   By: Hart Robinsons M.D.   On: October 01, 2023 11:27   DG Chest Portable 1 View  Result Date: 01-Oct-2023 CLINICAL DATA:  Shortness of breath. EXAM: PORTABLE CHEST 1 VIEW COMPARISON:  Chest radiograph dated July 04, 2023. FINDINGS: Low lung volumes. Endotracheal tube in place with tip located 3.9 cm above the carina. Enteric tube courses below the hemidiaphragm, beyond the field of view. Mild cardiomegaly. Patchy right medial mid to lower lung zone opacities. No pneumothorax or pleural effusion. No acute osseous abnormality. IMPRESSION: 1. Endotracheal tube tip located 3.9 cm above the carina. 2. Patchy airspace opacities within the right mid to lower lung zones may be secondary to aspiration or pneumonia. Electronically Signed   By: Hart Robinsons M.D.   On: 01-Oct-2023 10:58   CT Head Wo Contrast  Result Date: Oct 01, 2023 CLINICAL DATA:  Delirium EXAM: CT HEAD WITHOUT CONTRAST TECHNIQUE: Contiguous axial images were obtained from the base of the skull through the vertex without intravenous contrast. RADIATION DOSE REDUCTION: This exam was performed according to the departmental dose-optimization program which includes automated exposure control, adjustment of the mA and/or kV according to patient size and/or use of iterative reconstruction technique. COMPARISON:  Head CT 07/04/2023 FINDINGS: Brain: There is large volume intraventricular hemorrhage throughout the entirety of the left lateral ventricle extending into the third and fourth ventricles and small volume blood layering in the right occipital horn. The ventricles are significantly dilated with evidence of transependymal flow of CSF. There is diffuse sulcal effacement, effacement of the  basal cisterns, and crowding of the posterior fossa. There is intraparenchymal hemorrhage in the brainstem likely reflecting Duret hemorrhages. There is up to 1.5 cm rightward midline shift of the level of the septum pellucidum. Vascular: No hyperdense vessel or unexpected calcification. Skull: Normal. Negative for fracture or focal lesion. Sinuses/Orbits: The paranasal sinuses are clear. The globes and orbits are unremarkable. Other: The mastoid air cells and middle ear cavities are clear. IMPRESSION: 1. 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 2. Duret hemorrhage in the brainstem consistent with a developing brain herniation. 3. 1.5 cm rightward midline shift of the level of the septum pellucidum. Critical Value/emergent results were called by telephone at the time of interpretation on October 01, 2023 at 10:02 am to provider Tanda Rockers , who verbally acknowledged these results. Electronically Signed   By: Lesia Hausen M.D.   On: 10-01-2023 10:04    Pertinent labs & imaging results that were available during my care of the patient were reviewed by me and considered in my medical decision making (see MDM for details).  Medications Ordered in ED Medications  propofol (DIPRIVAN) 1000 MG/100ML infusion (0 mcg/kg/min  137 kg Intravenous Stopped 2023-10-01 1019)  fentaNYL (SUBLIMAZE) injection 50 mcg (has no administration in time range)  fentaNYL (SUBLIMAZE) injection 50 mcg (has no administration in time range)  sodium chloride 0.9 % bolus 1,000 mL (0 mLs Intravenous Stopped 01-Oct-2023 1104)  magnesium sulfate IVPB 2 g 50 mL (0 g Intravenous Stopped 01-Oct-2023 1116)  iohexol (OMNIPAQUE) 350 MG/ML injection 75 mL (75 mLs Intravenous Contrast Given 10-01-2023 0951)  rocuronium (ZEMURON) injection 100 mg (100 mg Intravenous Given 2023-10-01 0849)  etomidate (AMIDATE) injection 20 mg (20 mg Intravenous Given 10/01/2023 0849)  [place] EMERGENCY DEPARTMENT AT [facility] Provider Note  CSN: [id number] Arrival date & time: 09/27/23 0846  Chief Complaint(s) unresponsive  HPI Terry [name] is a 82 y.o. male with past medical history as below, significant for metastatic prostate cancer, CKD stage III, DM, HTN, A-fib, obesity who presents to the ED with complaint of AMS  Patient here from home with EMS.  Altered mental status, last known normal was over 24 hours ago per EMS.  Nonresponsive to noxious stimulus per EMS on arrival.  On arrival patient unresponsive, no response to noxious stimulus.  Unable to provide history.  Family not at bedside.  He is full code per chart review.  Proceed with emergent intubation and resuscitation   Past Medical History Past Medical History:  Diagnosis Date   Arthritis    Cancer (HCC)    Phreesia 08/04/2020   CHF, chronic (HCC)    diastolic   CKD (chronic kidney disease) stage 3, GFR 30-59 ml/min (HCC)    Diabetes mellitus without complication (HCC)    Dysrhythmia    Hypertension    Hypothyroidism    Lymphedema    Paroxysmal atrial fibrillation (HCC)    Prostate cancer [facility])    Renal insufficiency    Patient Active Problem List   Diagnosis Date Noted   Weakness 07/09/2023   Palliative care encounter 07/09/2023   Prostate cancer metastatic to bone (HCC) 07/06/2023   Left lower lobe pneumonia 07/05/2023   Acute cystitis 06/01/2023   Generalized weakness 05/31/2023   Urinary incontinence 05/09/2023   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) 02/24/2023   Asymptomatic microscopic hematuria 01/22/2022   Benign prostatic hyperplasia with lower urinary tract symptoms 01/22/2022   Nocturia 01/22/2022   Retained ureteral stent 01/22/2022   Goals of care, counseling/discussion 09/04/2021   Sepsis (HCC) 10/24/2020   Complicated UTI (urinary tract infection) 10/24/2020   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)  10/24/2020   CHF, chronic (HCC)    Prostate cancer (HCC)    Congestive heart failure (HCC) 07/02/2020   Macrocytic anemia 06/30/2020   Elevated PSA 06/30/2020   Cellulitis of lower extremity 06/30/2020   Prediabetes 01/04/2019   Paroxysmal atrial fibrillation (HCC) 01/01/2019   Risk for falls 12/27/2017   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HCC) 10/18/2017   Right ureteral stone 10/01/2017   Iron deficiency anemia due to chronic blood loss 06/22/2016   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   Depression with anxiety 06/19/2015   CKD (chronic kidney disease) stage 3, GFR 30-59 ml/min (HCC) 05/26/2015   Essential hypertension 11/01/2014   Gouty arthritis of toe 11/01/2014   Morbid obesity due to excess calories (HCC) 11/01/2014   Primary osteoarthritis of both knees 11/01/2014   Home Medication(s) Prior to Admission medications   Medication Sig Start Date End Date Taking? Authorizing Provider  acetaminophen (TYLENOL) 325 MG tablet Take 2 tablets (650 mg total) by mouth every 6 (six) hours as needed for mild pain (or Fever >/= 101). 07/11/23   Marlin Canary U, DO  allopurinol (ZYLOPRIM) 100 MG tablet Take 0.5 tablets (50 mg total) by mouth daily. 07/22/22   Donita Brooks, MD  apixaban (ELIQUIS) 2.5 MG TABS tablet Take 1 tablet (2.5 mg total) by mouth 2 (two) times daily. 07/22/22   Donita Brooks, MD  carvedilol (COREG) 25 MG tablet Take 25 mg by mouth 2 (two) times daily. 02/15/23   [provider]  diclofenac  Signed   By: Hart Robinsons M.D.   On: October 01, 2023 11:27   DG Chest Portable 1 View  Result Date: 01-Oct-2023 CLINICAL DATA:  Shortness of breath. EXAM: PORTABLE CHEST 1 VIEW COMPARISON:  Chest radiograph dated July 04, 2023. FINDINGS: Low lung volumes. Endotracheal tube in place with tip located 3.9 cm above the carina. Enteric tube courses below the hemidiaphragm, beyond the field of view. Mild cardiomegaly. Patchy right medial mid to lower lung zone opacities. No pneumothorax or pleural effusion. No acute osseous abnormality. IMPRESSION: 1. Endotracheal tube tip located 3.9 cm above the carina. 2. Patchy airspace opacities within the right mid to lower lung zones may be secondary to aspiration or pneumonia. Electronically Signed   By: Hart Robinsons M.D.   On: 01-Oct-2023 10:58   CT Head Wo Contrast  Result Date: Oct 01, 2023 CLINICAL DATA:  Delirium EXAM: CT HEAD WITHOUT CONTRAST TECHNIQUE: Contiguous axial images were obtained from the base of the skull through the vertex without intravenous contrast. RADIATION DOSE REDUCTION: This exam was performed according to the departmental dose-optimization program which includes automated exposure control, adjustment of the mA and/or kV according to patient size and/or use of iterative reconstruction technique. COMPARISON:  Head CT 07/04/2023 FINDINGS: Brain: There is large volume intraventricular hemorrhage throughout the entirety of the left lateral ventricle extending into the third and fourth ventricles and small volume blood layering in the right occipital horn. The ventricles are significantly dilated with evidence of transependymal flow of CSF. There is diffuse sulcal effacement, effacement of the  basal cisterns, and crowding of the posterior fossa. There is intraparenchymal hemorrhage in the brainstem likely reflecting Duret hemorrhages. There is up to 1.5 cm rightward midline shift of the level of the septum pellucidum. Vascular: No hyperdense vessel or unexpected calcification. Skull: Normal. Negative for fracture or focal lesion. Sinuses/Orbits: The paranasal sinuses are clear. The globes and orbits are unremarkable. Other: The mastoid air cells and middle ear cavities are clear. IMPRESSION: 1. 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 2. Duret hemorrhage in the brainstem consistent with a developing brain herniation. 3. 1.5 cm rightward midline shift of the level of the septum pellucidum. Critical Value/emergent results were called by telephone at the time of interpretation on October 01, 2023 at 10:02 am to provider Tanda Rockers , who verbally acknowledged these results. Electronically Signed   By: Lesia Hausen M.D.   On: 10-01-2023 10:04    Pertinent labs & imaging results that were available during my care of the patient were reviewed by me and considered in my medical decision making (see MDM for details).  Medications Ordered in ED Medications  propofol (DIPRIVAN) 1000 MG/100ML infusion (0 mcg/kg/min  137 kg Intravenous Stopped 2023-10-01 1019)  fentaNYL (SUBLIMAZE) injection 50 mcg (has no administration in time range)  fentaNYL (SUBLIMAZE) injection 50 mcg (has no administration in time range)  sodium chloride 0.9 % bolus 1,000 mL (0 mLs Intravenous Stopped 01-Oct-2023 1104)  magnesium sulfate IVPB 2 g 50 mL (0 g Intravenous Stopped 01-Oct-2023 1116)  iohexol (OMNIPAQUE) 350 MG/ML injection 75 mL (75 mLs Intravenous Contrast Given 10-01-2023 0951)  rocuronium (ZEMURON) injection 100 mg (100 mg Intravenous Given 2023-10-01 0849)  etomidate (AMIDATE) injection 20 mg (20 mg Intravenous Given 10/01/2023 0849)  Signed   By: Hart Robinsons M.D.   On: October 01, 2023 11:27   DG Chest Portable 1 View  Result Date: 01-Oct-2023 CLINICAL DATA:  Shortness of breath. EXAM: PORTABLE CHEST 1 VIEW COMPARISON:  Chest radiograph dated July 04, 2023. FINDINGS: Low lung volumes. Endotracheal tube in place with tip located 3.9 cm above the carina. Enteric tube courses below the hemidiaphragm, beyond the field of view. Mild cardiomegaly. Patchy right medial mid to lower lung zone opacities. No pneumothorax or pleural effusion. No acute osseous abnormality. IMPRESSION: 1. Endotracheal tube tip located 3.9 cm above the carina. 2. Patchy airspace opacities within the right mid to lower lung zones may be secondary to aspiration or pneumonia. Electronically Signed   By: Hart Robinsons M.D.   On: 01-Oct-2023 10:58   CT Head Wo Contrast  Result Date: Oct 01, 2023 CLINICAL DATA:  Delirium EXAM: CT HEAD WITHOUT CONTRAST TECHNIQUE: Contiguous axial images were obtained from the base of the skull through the vertex without intravenous contrast. RADIATION DOSE REDUCTION: This exam was performed according to the departmental dose-optimization program which includes automated exposure control, adjustment of the mA and/or kV according to patient size and/or use of iterative reconstruction technique. COMPARISON:  Head CT 07/04/2023 FINDINGS: Brain: There is large volume intraventricular hemorrhage throughout the entirety of the left lateral ventricle extending into the third and fourth ventricles and small volume blood layering in the right occipital horn. The ventricles are significantly dilated with evidence of transependymal flow of CSF. There is diffuse sulcal effacement, effacement of the  basal cisterns, and crowding of the posterior fossa. There is intraparenchymal hemorrhage in the brainstem likely reflecting Duret hemorrhages. There is up to 1.5 cm rightward midline shift of the level of the septum pellucidum. Vascular: No hyperdense vessel or unexpected calcification. Skull: Normal. Negative for fracture or focal lesion. Sinuses/Orbits: The paranasal sinuses are clear. The globes and orbits are unremarkable. Other: The mastoid air cells and middle ear cavities are clear. IMPRESSION: 1. 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 2. Duret hemorrhage in the brainstem consistent with a developing brain herniation. 3. 1.5 cm rightward midline shift of the level of the septum pellucidum. Critical Value/emergent results were called by telephone at the time of interpretation on October 01, 2023 at 10:02 am to provider Tanda Rockers , who verbally acknowledged these results. Electronically Signed   By: Lesia Hausen M.D.   On: 10-01-2023 10:04    Pertinent labs & imaging results that were available during my care of the patient were reviewed by me and considered in my medical decision making (see MDM for details).  Medications Ordered in ED Medications  propofol (DIPRIVAN) 1000 MG/100ML infusion (0 mcg/kg/min  137 kg Intravenous Stopped 2023-10-01 1019)  fentaNYL (SUBLIMAZE) injection 50 mcg (has no administration in time range)  fentaNYL (SUBLIMAZE) injection 50 mcg (has no administration in time range)  sodium chloride 0.9 % bolus 1,000 mL (0 mLs Intravenous Stopped 01-Oct-2023 1104)  magnesium sulfate IVPB 2 g 50 mL (0 g Intravenous Stopped 01-Oct-2023 1116)  iohexol (OMNIPAQUE) 350 MG/ML injection 75 mL (75 mLs Intravenous Contrast Given 10-01-2023 0951)  rocuronium (ZEMURON) injection 100 mg (100 mg Intravenous Given 2023-10-01 0849)  etomidate (AMIDATE) injection 20 mg (20 mg Intravenous Given 10/01/2023 0849)  [place] EMERGENCY DEPARTMENT AT [facility] Provider Note  CSN: [id number] Arrival date & time: 09/27/23 0846  Chief Complaint(s) unresponsive  HPI Terry [name] is a 82 y.o. male with past medical history as below, significant for metastatic prostate cancer, CKD stage III, DM, HTN, A-fib, obesity who presents to the ED with complaint of AMS  Patient here from home with EMS.  Altered mental status, last known normal was over 24 hours ago per EMS.  Nonresponsive to noxious stimulus per EMS on arrival.  On arrival patient unresponsive, no response to noxious stimulus.  Unable to provide history.  Family not at bedside.  He is full code per chart review.  Proceed with emergent intubation and resuscitation   Past Medical History Past Medical History:  Diagnosis Date   Arthritis    Cancer (HCC)    [employer]reesia 08/04/2020   CHF, chronic (HCC)    diastolic   CKD (chronic kidney disease) stage 3, GFR 30-59 ml/min (HCC)    Diabetes mellitus without complication (HCC)    Dysrhythmia    Hypertension    Hypothyroidism    Lymphedema    Paroxysmal atrial fibrillation (HCC)    Prostate cancer [facility])    Renal insufficiency    Patient Active Problem List   Diagnosis Date Noted   Weakness 07/09/2023   Palliative care encounter 07/09/2023   Prostate cancer metastatic to bone (HCC) 07/06/2023   Left lower lobe pneumonia 07/05/2023   Acute cystitis 06/01/2023   Generalized weakness 05/31/2023   Urinary incontinence 05/09/2023   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) 02/24/2023   Asymptomatic microscopic hematuria 01/22/2022   Benign prostatic hyperplasia with lower urinary tract symptoms 01/22/2022   Nocturia 01/22/2022   Retained ureteral stent 01/22/2022   Goals of care, counseling/discussion 09/04/2021   Sepsis (HCC) 10/24/2020   Complicated UTI (urinary tract infection) 10/24/2020   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)  10/24/2020   CHF, chronic (HCC)    Prostate cancer (HCC)    Congestive heart failure (HCC) 07/02/2020   Macrocytic anemia 06/30/2020   Elevated PSA 06/30/2020   Cellulitis of lower extremity 06/30/2020   Prediabetes 01/04/2019   Paroxysmal atrial fibrillation (HCC) 01/01/2019   Risk for falls 12/27/2017   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HCC) 10/18/2017   Right ureteral stone 10/01/2017   Iron deficiency anemia due to chronic blood loss 06/22/2016   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   Depression with anxiety 06/19/2015   CKD (chronic kidney disease) stage 3, GFR 30-59 ml/min (HCC) 05/26/2015   Essential hypertension 11/01/2014   Gouty arthritis of toe 11/01/2014   Morbid obesity due to excess calories (HCC) 11/01/2014   Primary osteoarthritis of both knees 11/01/2014   Home Medication(s) Prior to Admission medications   Medication Sig Start Date End Date Taking? Authorizing Provider  acetaminophen (TYLENOL) 325 MG tablet Take 2 tablets (650 mg total) by mouth every 6 (six) hours as needed for mild pain (or Fever >/= 101). 07/11/23   Marlin Canary U, DO  allopurinol (ZYLOPRIM) 100 MG tablet Take 0.5 tablets (50 mg total) by mouth daily. 07/22/22   Donita Brooks, MD  apixaban (ELIQUIS) 2.5 MG TABS tablet Take 1 tablet (2.5 mg total) by mouth 2 (two) times daily. 07/22/22   Donita Brooks, MD  carvedilol (COREG) 25 MG tablet Take 25 mg by mouth 2 (two) times daily. 02/15/23   [provider]  diclofenac  Signed   By: Hart Robinsons M.D.   On: October 01, 2023 11:27   DG Chest Portable 1 View  Result Date: 01-Oct-2023 CLINICAL DATA:  Shortness of breath. EXAM: PORTABLE CHEST 1 VIEW COMPARISON:  Chest radiograph dated July 04, 2023. FINDINGS: Low lung volumes. Endotracheal tube in place with tip located 3.9 cm above the carina. Enteric tube courses below the hemidiaphragm, beyond the field of view. Mild cardiomegaly. Patchy right medial mid to lower lung zone opacities. No pneumothorax or pleural effusion. No acute osseous abnormality. IMPRESSION: 1. Endotracheal tube tip located 3.9 cm above the carina. 2. Patchy airspace opacities within the right mid to lower lung zones may be secondary to aspiration or pneumonia. Electronically Signed   By: Hart Robinsons M.D.   On: 01-Oct-2023 10:58   CT Head Wo Contrast  Result Date: Oct 01, 2023 CLINICAL DATA:  Delirium EXAM: CT HEAD WITHOUT CONTRAST TECHNIQUE: Contiguous axial images were obtained from the base of the skull through the vertex without intravenous contrast. RADIATION DOSE REDUCTION: This exam was performed according to the departmental dose-optimization program which includes automated exposure control, adjustment of the mA and/or kV according to patient size and/or use of iterative reconstruction technique. COMPARISON:  Head CT 07/04/2023 FINDINGS: Brain: There is large volume intraventricular hemorrhage throughout the entirety of the left lateral ventricle extending into the third and fourth ventricles and small volume blood layering in the right occipital horn. The ventricles are significantly dilated with evidence of transependymal flow of CSF. There is diffuse sulcal effacement, effacement of the  basal cisterns, and crowding of the posterior fossa. There is intraparenchymal hemorrhage in the brainstem likely reflecting Duret hemorrhages. There is up to 1.5 cm rightward midline shift of the level of the septum pellucidum. Vascular: No hyperdense vessel or unexpected calcification. Skull: Normal. Negative for fracture or focal lesion. Sinuses/Orbits: The paranasal sinuses are clear. The globes and orbits are unremarkable. Other: The mastoid air cells and middle ear cavities are clear. IMPRESSION: 1. 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 2. Duret hemorrhage in the brainstem consistent with a developing brain herniation. 3. 1.5 cm rightward midline shift of the level of the septum pellucidum. Critical Value/emergent results were called by telephone at the time of interpretation on October 01, 2023 at 10:02 am to provider Tanda Rockers , who verbally acknowledged these results. Electronically Signed   By: Lesia Hausen M.D.   On: 10-01-2023 10:04    Pertinent labs & imaging results that were available during my care of the patient were reviewed by me and considered in my medical decision making (see MDM for details).  Medications Ordered in ED Medications  propofol (DIPRIVAN) 1000 MG/100ML infusion (0 mcg/kg/min  137 kg Intravenous Stopped 2023-10-01 1019)  fentaNYL (SUBLIMAZE) injection 50 mcg (has no administration in time range)  fentaNYL (SUBLIMAZE) injection 50 mcg (has no administration in time range)  sodium chloride 0.9 % bolus 1,000 mL (0 mLs Intravenous Stopped 01-Oct-2023 1104)  magnesium sulfate IVPB 2 g 50 mL (0 g Intravenous Stopped 01-Oct-2023 1116)  iohexol (OMNIPAQUE) 350 MG/ML injection 75 mL (75 mLs Intravenous Contrast Given 10-01-2023 0951)  rocuronium (ZEMURON) injection 100 mg (100 mg Intravenous Given 2023-10-01 0849)  etomidate (AMIDATE) injection 20 mg (20 mg Intravenous Given 10/01/2023 0849)  Signed   By: Hart Robinsons M.D.   On: October 01, 2023 11:27   DG Chest Portable 1 View  Result Date: 01-Oct-2023 CLINICAL DATA:  Shortness of breath. EXAM: PORTABLE CHEST 1 VIEW COMPARISON:  Chest radiograph dated July 04, 2023. FINDINGS: Low lung volumes. Endotracheal tube in place with tip located 3.9 cm above the carina. Enteric tube courses below the hemidiaphragm, beyond the field of view. Mild cardiomegaly. Patchy right medial mid to lower lung zone opacities. No pneumothorax or pleural effusion. No acute osseous abnormality. IMPRESSION: 1. Endotracheal tube tip located 3.9 cm above the carina. 2. Patchy airspace opacities within the right mid to lower lung zones may be secondary to aspiration or pneumonia. Electronically Signed   By: Hart Robinsons M.D.   On: 01-Oct-2023 10:58   CT Head Wo Contrast  Result Date: Oct 01, 2023 CLINICAL DATA:  Delirium EXAM: CT HEAD WITHOUT CONTRAST TECHNIQUE: Contiguous axial images were obtained from the base of the skull through the vertex without intravenous contrast. RADIATION DOSE REDUCTION: This exam was performed according to the departmental dose-optimization program which includes automated exposure control, adjustment of the mA and/or kV according to patient size and/or use of iterative reconstruction technique. COMPARISON:  Head CT 07/04/2023 FINDINGS: Brain: There is large volume intraventricular hemorrhage throughout the entirety of the left lateral ventricle extending into the third and fourth ventricles and small volume blood layering in the right occipital horn. The ventricles are significantly dilated with evidence of transependymal flow of CSF. There is diffuse sulcal effacement, effacement of the  basal cisterns, and crowding of the posterior fossa. There is intraparenchymal hemorrhage in the brainstem likely reflecting Duret hemorrhages. There is up to 1.5 cm rightward midline shift of the level of the septum pellucidum. Vascular: No hyperdense vessel or unexpected calcification. Skull: Normal. Negative for fracture or focal lesion. Sinuses/Orbits: The paranasal sinuses are clear. The globes and orbits are unremarkable. Other: The mastoid air cells and middle ear cavities are clear. IMPRESSION: 1. 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 2. Duret hemorrhage in the brainstem consistent with a developing brain herniation. 3. 1.5 cm rightward midline shift of the level of the septum pellucidum. Critical Value/emergent results were called by telephone at the time of interpretation on October 01, 2023 at 10:02 am to provider Tanda Rockers , who verbally acknowledged these results. Electronically Signed   By: Lesia Hausen M.D.   On: 10-01-2023 10:04    Pertinent labs & imaging results that were available during my care of the patient were reviewed by me and considered in my medical decision making (see MDM for details).  Medications Ordered in ED Medications  propofol (DIPRIVAN) 1000 MG/100ML infusion (0 mcg/kg/min  137 kg Intravenous Stopped 2023-10-01 1019)  fentaNYL (SUBLIMAZE) injection 50 mcg (has no administration in time range)  fentaNYL (SUBLIMAZE) injection 50 mcg (has no administration in time range)  sodium chloride 0.9 % bolus 1,000 mL (0 mLs Intravenous Stopped 01-Oct-2023 1104)  magnesium sulfate IVPB 2 g 50 mL (0 g Intravenous Stopped 01-Oct-2023 1116)  iohexol (OMNIPAQUE) 350 MG/ML injection 75 mL (75 mLs Intravenous Contrast Given 10-01-2023 0951)  rocuronium (ZEMURON) injection 100 mg (100 mg Intravenous Given 2023-10-01 0849)  etomidate (AMIDATE) injection 20 mg (20 mg Intravenous Given 10/01/2023 0849)

## 2023-09-16 NOTE — Procedures (Signed)
Extubation Procedure Note  Patient Details:   Name: Terry [name] DOB: [birth year] MRN: [id number]   Airway Documentation:    Vent end date: 2023/10/01 Vent end time: 1205   Evaluation  O2 sats:  comfort Complications: No apparent complications Patient did tolerate procedure well. Bilateral Breath Sounds: Rhonchi   No  Patient extubated per MD order to comfort.  Patient tolerated well.    Elyn Peers 2023-10-01, 12:19 PM

## 2023-09-16 NOTE — Progress Notes (Signed)
Patient intubated by ED MD.  During first attempt, patient did vomit, however vomit was suctioned from the mouth.  Patient intubated on second attempt with positive color change noted.  Bilateral breath sounds auscultated.  Chest xray pending for confirmation of tube placement.  Will continue to monitor.

## 2023-09-16 NOTE — ED Triage Notes (Signed)
Pt arrived via [facility] from home. Family told EMS, pt was unresponsive today. Per EMS, family stated pt LKW 08/31/23. Per EMS, family stated pt was lethargic yesterday. Pt arrived with NPA in nostril.

## 2023-09-16 DEATH — deceased

## 2023-09-27 ENCOUNTER — Other Ambulatory Visit: Payer: Medicare Other

## 2023-09-27 ENCOUNTER — Ambulatory Visit: Payer: Medicare Other | Admitting: Hematology

## 2023-12-29 ENCOUNTER — Encounter: Payer: Self-pay | Admitting: Family Medicine

## 2023-12-29 ENCOUNTER — Telehealth: Payer: Self-pay

## 2023-12-29 NOTE — Telephone Encounter (Signed)
Copied from CRM [id number]. Topic: Medical Record Request - Other >> Dec 29, 2023 12:30 PM Fuller Mandril wrote: Reason for CRM: Patient daughter called. States father passed away in 2024-09-25 and her brother is executor of the will and is contesting the will stating that patient had dementia. Caller is trying to get a letter or office visits notes stating that patient was not seen for or treated for dementia. Caller calling from [place] where she lives. Will call back to check if this is able to be done. Thank You
